# Patient Record
Sex: Female | Born: 1940 | Race: White | Hispanic: No | Smoking: Never smoker
Health system: Southern US, Community
[De-identification: ages and names within clinical notes are randomized; demographics above are authoritative.]

## PROBLEM LIST (undated history)

## (undated) DIAGNOSIS — R634 Abnormal weight loss: Secondary | ICD-10-CM

## (undated) DIAGNOSIS — I2510 Atherosclerotic heart disease of native coronary artery without angina pectoris: Secondary | ICD-10-CM

## (undated) DIAGNOSIS — Z8489 Family history of other specified conditions: Secondary | ICD-10-CM

## (undated) DIAGNOSIS — I4891 Unspecified atrial fibrillation: Secondary | ICD-10-CM

## (undated) DIAGNOSIS — I7121 Aneurysm of the ascending aorta, without rupture: Secondary | ICD-10-CM

## (undated) DIAGNOSIS — Z952 Presence of prosthetic heart valve: Secondary | ICD-10-CM

## (undated) DIAGNOSIS — F419 Anxiety disorder, unspecified: Secondary | ICD-10-CM

## (undated) DIAGNOSIS — R011 Cardiac murmur, unspecified: Secondary | ICD-10-CM

## (undated) DIAGNOSIS — M353 Polymyalgia rheumatica: Secondary | ICD-10-CM

## (undated) DIAGNOSIS — Z8719 Personal history of other diseases of the digestive system: Secondary | ICD-10-CM

## (undated) DIAGNOSIS — R9431 Abnormal electrocardiogram [ECG] [EKG]: Secondary | ICD-10-CM

## (undated) DIAGNOSIS — E785 Hyperlipidemia, unspecified: Secondary | ICD-10-CM

## (undated) DIAGNOSIS — I10 Essential (primary) hypertension: Secondary | ICD-10-CM

## (undated) DIAGNOSIS — I712 Thoracic aortic aneurysm, without rupture: Secondary | ICD-10-CM

## (undated) HISTORY — DX: Atherosclerotic heart disease of native coronary artery without angina pectoris: I25.10

## (undated) HISTORY — DX: Abnormal electrocardiogram (ECG) (EKG): R94.31

## (undated) HISTORY — DX: Anxiety disorder, unspecified: F41.9

## (undated) HISTORY — DX: Aneurysm of the ascending aorta, without rupture: I71.21

## (undated) HISTORY — DX: Thoracic aortic aneurysm, without rupture: I71.2

## (undated) HISTORY — PX: TONSILLECTOMY: SUR1361

## (undated) HISTORY — DX: Essential (primary) hypertension: I10

## (undated) HISTORY — DX: Abnormal weight loss: R63.4

## (undated) HISTORY — DX: Polymyalgia rheumatica: M35.3

## (undated) HISTORY — DX: Presence of prosthetic heart valve: Z95.2

## (undated) HISTORY — PX: TUBAL LIGATION: SHX77

## (undated) HISTORY — DX: Hyperlipidemia, unspecified: E78.5

---

## 2005-08-18 ENCOUNTER — Other Ambulatory Visit: Admission: RE | Admit: 2005-08-18 | Discharge: 2005-08-18 | Payer: Self-pay | Admitting: Obstetrics and Gynecology

## 2014-02-04 ENCOUNTER — Ambulatory Visit: Payer: Self-pay | Admitting: Cardiology

## 2014-03-14 DIAGNOSIS — I2510 Atherosclerotic heart disease of native coronary artery without angina pectoris: Secondary | ICD-10-CM | POA: Insufficient documentation

## 2014-03-14 HISTORY — DX: Atherosclerotic heart disease of native coronary artery without angina pectoris: I25.10

## 2014-03-18 ENCOUNTER — Ambulatory Visit (INDEPENDENT_AMBULATORY_CARE_PROVIDER_SITE_OTHER): Payer: 59 | Admitting: Cardiology

## 2014-03-18 ENCOUNTER — Encounter: Payer: Self-pay | Admitting: Cardiology

## 2014-03-18 ENCOUNTER — Telehealth: Payer: Self-pay

## 2014-03-18 VITALS — BP 144/50 | HR 67 | Ht 61.0 in | Wt 106.0 lb

## 2014-03-18 DIAGNOSIS — R079 Chest pain, unspecified: Secondary | ICD-10-CM | POA: Insufficient documentation

## 2014-03-18 DIAGNOSIS — I119 Hypertensive heart disease without heart failure: Secondary | ICD-10-CM

## 2014-03-18 DIAGNOSIS — R011 Cardiac murmur, unspecified: Secondary | ICD-10-CM | POA: Insufficient documentation

## 2014-03-18 DIAGNOSIS — I10 Essential (primary) hypertension: Secondary | ICD-10-CM | POA: Insufficient documentation

## 2014-03-18 NOTE — Progress Notes (Signed)
[facility], [place], [place]  [address] Phone: [phone] Fax:  [phone]  Date:  03/18/2014   ID:  Yvonne Ballard, DOB 08-18-1940, MRN [id number]  PCP:  Lynnell Jude, MD  Cardiologist:  Fransico Him, MD    History of Present Illness: Yvonne Ballard is a 74 y.o. female with a history of poorly controlled HTN.  Per PCP notes, she has high systolic and low diastolic readings.  Her HTN is apparently exacerbated by stress and anxiety.  She denies any  SOB, DOE, LE edema, dizziness, or syncope.  She recently described a sensation in her chest that is not a pain but a "twitching" but also tight and squeezing that is nonexertional and usually occurs more when she is anxious.  The tightness usually lasts up to 30 minutes but is not associated with diaphoresis or nausea.  There is no radiation of the discomfort.  She has not had any DOE.  EKG at PCP office showed NSR with PVC's and PAC's with marked LVH.  She is now here for further evaluation.  She says at home it usually runs 160/50-60's. Occasionally she will have a skipped heart beat.   Wt Readings from Last 3 Encounters:  03/18/14 106 lb (48.081 kg)     Past Medical History  Diagnosis Date  . Hypertension   . Abnormal ECG   . Anxiety disorder   . Weight loss   . Chest pain     Current Outpatient Prescriptions  Medication Sig Dispense Refill  . ALPRAZolam (XANAX) 0.25 MG tablet Take 0.25 mg by mouth daily as needed.  0  . CARTIA XT 120 MG 24 hr capsule Take 120 mg by mouth daily.  12  . diltiazem (CARDIZEM SR) 120 MG 12 hr capsule Take 120 mg by mouth daily. TAKE ONE TABLET BY MOUTH DAILY    . hydrochlorothiazide (HYDRODIURIL) 50 MG tablet Take 50 mg by mouth daily.  3   No current facility-administered medications for this visit.    Allergies:   Not on File  Social History:  The patient  reports that she has never smoked. She does not have any smokeless tobacco history on file. She reports that she drinks  alcohol.   Family History:  The patient's family history includes Cancer in her brother, father, and mother.   ROS:  Please see the history of present illness.      All other systems reviewed and negative.   PHYSICAL EXAM: VS:  BP 144/50 mmHg  Pulse 67  Ht 5\' 1"  (1.549 m)  Wt 106 lb (48.081 kg)  BMI 20.04 kg/m2 Well nourished, well developed, in no acute distress HEENT: normal Neck: no JVD Cardiac:  normal S1, S2; RRR; 2/6 SM at RUSB to apex and 2/6 diastolic murmur from RUSB to LLSB Lungs:  clear to auscultation bilaterally, no wheezing, rhonchi or rales Abd: soft, nontender, no hepatomegaly Ext: no edema Skin: warm and dry Neuro:  CNs 2-12 intact, no focal abnormalities noted  EKG:  NSR with PAC's and PVC's and marked LVH by voltage     ASSESSMENT AND PLAN:  1. HTN poorly controlled in the past but appears good today.  Will continue Cartia and I am enrolling her in the BP study group through our office.  I have asked her to check her BP daily for a week and call with results. 2. 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  I will get an echo  to assess.   3. Chest discomfort that is somewhat atypical and seem to occur worse with anxiety.  I will get a nuclear stress test to rule out ischemia.  4.  Heart murmur that sounds like she may have a degree of AS and AI.  I will check a 2D echo to assess heart murmur  Followup with me in 6 weeks  Signed, Fransico Him, MD [facility] 03/18/2014 4:37 PM

## 2014-03-18 NOTE — Patient Instructions (Addendum)
Your physician has requested that you have an echocardiogram A FEW DAYS BEFORE YOUR NUCLEAR STRESS TEST. Echocardiography is a painless test that uses sound waves to create images of your heart. It provides your doctor with information about the size and shape of your heart and how well your heart's chambers and valves are working. This procedure takes approximately one hour. There are no restrictions for this procedure.  Your physician has requested that you have a lexiscan myoview. For further information please visit [email or web address]. Please follow instruction sheet, as given.  Please check your blood pressure daily for one week and call us with results. [phone].  Your physician recommends that you schedule a follow-up appointment in: 6 weeks with Dr. Radford Pax.

## 2014-03-18 NOTE — Telephone Encounter (Signed)
Patient refuses Blood Pressure Program trial and nuclear stress test.  To Dr. Radford Pax.

## 2014-03-21 ENCOUNTER — Ambulatory Visit (HOSPITAL_COMMUNITY): Payer: 59 | Attending: Cardiovascular Disease | Admitting: Cardiology

## 2014-03-21 ENCOUNTER — Other Ambulatory Visit (INDEPENDENT_AMBULATORY_CARE_PROVIDER_SITE_OTHER): Payer: 59 | Admitting: *Deleted

## 2014-03-21 ENCOUNTER — Other Ambulatory Visit: Payer: Self-pay | Admitting: *Deleted

## 2014-03-21 ENCOUNTER — Telehealth: Payer: Self-pay | Admitting: Cardiovascular Disease

## 2014-03-21 ENCOUNTER — Encounter: Payer: Self-pay | Admitting: *Deleted

## 2014-03-21 DIAGNOSIS — R011 Cardiac murmur, unspecified: Secondary | ICD-10-CM | POA: Diagnosis not present

## 2014-03-21 DIAGNOSIS — I719 Aortic aneurysm of unspecified site, without rupture: Secondary | ICD-10-CM

## 2014-03-21 DIAGNOSIS — I10 Essential (primary) hypertension: Secondary | ICD-10-CM

## 2014-03-21 LAB — BASIC METABOLIC PANEL
BUN: 15 mg/dL (ref 6–23)
CO2: 30 meq/L (ref 19–32)
CREATININE: 0.6 mg/dL (ref 0.4–1.2)
Calcium: 8.6 mg/dL (ref 8.4–10.5)
Chloride: 100 mEq/L (ref 96–112)
GFR: 107.65 mL/min (ref >60.00)
Glucose, Bld: 92 mg/dL (ref 70–99)
POTASSIUM: 3.3 meq/L — AB (ref 3.5–5.1)
SODIUM: 137 meq/L (ref 135–145)

## 2014-03-21 NOTE — Progress Notes (Signed)
Echo performed. 

## 2014-03-21 NOTE — Telephone Encounter (Signed)
I called the patient to review her echocardiogram further. I initially evaluated her while the study was being done. I was contacted by the sonographer because of marked dilatation of the ascending aorta and associated aortic insufficiency. The patient was referred for the echo because of a diastolic heart murmur consistent with aortic insufficiency. She's had a wide pulse pressure for a long time. I suspected chronic aortic insufficiency related to annular dilatation and an ascending aortic aneurysm. A CTA of the chest was arranged for Monday.  I received a call from Dr. Johnsie Cancel who interpreted her echocardiogram and was concerned about possible acute aortic dissection. He recommended immediate hospitalization and inpatient evaluation. I called the patient to discuss plans further.I reviewed the concerns about acute dissection. The patient has no symptoms and she specifically denies chest pain or pressure. She adamantly refuses to come to the hospital. She understands that an acute dissection could be a life-threatening emergency. She will come in Monday as planned.   Based on her clinical presentation, my clinical suspicion is that this is more of a chronic problem.  Sherren Mocha 03/21/2014 5:48 PM

## 2014-03-24 ENCOUNTER — Ambulatory Visit (INDEPENDENT_AMBULATORY_CARE_PROVIDER_SITE_OTHER)
Admission: RE | Admit: 2014-03-24 | Discharge: 2014-03-24 | Disposition: A | Discharge status: Home / Self Care | Payer: 59 | Source: Ambulatory Visit | Attending: Cardiovascular Disease | Admitting: Cardiovascular Disease

## 2014-03-24 DIAGNOSIS — I719 Aortic aneurysm of unspecified site, without rupture: Secondary | ICD-10-CM

## 2014-03-24 MED ORDER — IOHEXOL 300 MG/ML  SOLN
80.0000 mL | Freq: Once | INTRAMUSCULAR | Status: AC | PRN
  Administered 2014-03-24: 80 mL via INTRAVENOUS

## 2014-03-24 MED ORDER — POTASSIUM CHLORIDE ER 20 MEQ PO TBCR: 20.0000 meq | EXTENDED_RELEASE_TABLET | Freq: Every day | ORAL | Status: DC

## 2014-03-24 NOTE — Telephone Encounter (Signed)
Per Dr. Radford Pax, instructed patient to START Kdur 20 meg -- to take 2 tablets today and 1 tablet daily starting tomorrow. Repeat lab ordered but patient is to call to schedule. She is aware it needs to be in about 1 week.

## 2014-03-24 NOTE — Telephone Encounter (Signed)
Patient in for CTA and asked for results of recent lab work.  Informed patient she would be called after Dr. Radford Pax releases it.

## 2014-03-24 NOTE — Addendum Note (Signed)
Addended by: Harland German A on: 03/24/2014 05:45 PM   Modules accepted: Orders

## 2014-03-24 NOTE — Telephone Encounter (Signed)
No problem thanks Ronalee Belts.  Is she set up for cath later on this week?

## 2014-03-25 ENCOUNTER — Institutional Professional Consult (permissible substitution) (INDEPENDENT_AMBULATORY_CARE_PROVIDER_SITE_OTHER): Payer: 59 | Admitting: Cardiothoracic Surgery

## 2014-03-25 ENCOUNTER — Telehealth: Payer: Self-pay

## 2014-03-25 ENCOUNTER — Other Ambulatory Visit (INDEPENDENT_AMBULATORY_CARE_PROVIDER_SITE_OTHER): Payer: 59 | Admitting: *Deleted

## 2014-03-25 ENCOUNTER — Other Ambulatory Visit: Payer: Self-pay | Admitting: *Deleted

## 2014-03-25 ENCOUNTER — Encounter: Payer: Self-pay | Admitting: Cardiothoracic Surgery

## 2014-03-25 VITALS — BP 189/60 | HR 77 | Resp 18 | Ht 61.0 in | Wt 105.0 lb

## 2014-03-25 DIAGNOSIS — Z01812 Encounter for preprocedural laboratory examination: Secondary | ICD-10-CM

## 2014-03-25 DIAGNOSIS — I719 Aortic aneurysm of unspecified site, without rupture: Secondary | ICD-10-CM

## 2014-03-25 DIAGNOSIS — I712 Thoracic aortic aneurysm, without rupture: Secondary | ICD-10-CM

## 2014-03-25 DIAGNOSIS — I7122 Aneurysm of the aortic arch, without rupture: Secondary | ICD-10-CM

## 2014-03-25 DIAGNOSIS — R079 Chest pain, unspecified: Secondary | ICD-10-CM

## 2014-03-25 DIAGNOSIS — I7121 Aneurysm of the ascending aorta, without rupture: Secondary | ICD-10-CM

## 2014-03-25 DIAGNOSIS — I351 Nonrheumatic aortic (valve) insufficiency: Secondary | ICD-10-CM

## 2014-03-25 LAB — CBC WITH DIFFERENTIAL/PLATELET
BASOS ABS: 0 10*3/uL (ref 0.0–0.1)
BASOS PCT: 0.6 % (ref 0.0–3.0)
Eosinophils Absolute: 0 10*3/uL (ref 0.0–0.7)
Eosinophils Relative: 0.5 % (ref 0.0–5.0)
HCT: 36.2 % (ref 36.0–46.0)
Hemoglobin: 11.9 g/dL — ABNORMAL LOW (ref 12.0–15.0)
LYMPHS PCT: 27.2 % (ref 12.0–46.0)
Lymphs Abs: 1.4 10*3/uL (ref 0.7–4.0)
MCHC: 33 g/dL (ref 30.0–36.0)
MCV: 91.3 fl (ref 78.0–100.0)
MONOS PCT: 7.6 % (ref 3.0–12.0)
Monocytes Absolute: 0.4 10*3/uL (ref 0.1–1.0)
NEUTROS PCT: 64.1 % (ref 43.0–77.0)
Neutro Abs: 3.3 10*3/uL (ref 1.4–7.7)
Platelets: 180 10*3/uL (ref 150.0–400.0)
RBC: 3.96 Mil/uL (ref 3.87–5.11)
RDW: 13.7 % (ref 11.5–15.5)
WBC: 5.1 10*3/uL (ref 4.0–10.5)

## 2014-03-25 LAB — PROTIME-INR
INR: 1 ratio (ref 0.8–1.0)
Prothrombin Time: 10.9 s (ref 9.6–13.1)

## 2014-03-25 LAB — BASIC METABOLIC PANEL
BUN: 15 mg/dL (ref 6–23)
CHLORIDE: 101 meq/L (ref 96–112)
CO2: 27 meq/L (ref 19–32)
Calcium: 8.8 mg/dL (ref 8.4–10.5)
Creatinine, Ser: 0.6 mg/dL (ref 0.4–1.2)
GFR: 103.52 mL/min (ref >60.00)
Glucose, Bld: 94 mg/dL (ref 70–99)
Potassium: 3.6 mEq/L (ref 3.5–5.1)
SODIUM: 135 meq/L (ref 135–145)

## 2014-03-25 NOTE — Progress Notes (Signed)
PCP is BLISS, Lynnell Jude, MD Referring Provider is Sueanne Margarita, MD        [address]       [place] [address]             [phone]    Patient examined, echocardiogram and CT angiogram reviewed. 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 The patient has no history of angina or presyncope. She is a never smoker. There is no family history of thoracic or abdominal aneurysm disease. There's no family history of aortic dissection or sudden death. The patient denies history of rheumatic heart disease as a school girl. The patient denies ankle edema, abdominal swelling, or weight gain. The patient is scheduled to undergo cardiac catheterization tomorrow in preparation for surgical repair-aortic root replacement and replacement of the ascending aorta to the mid arch.  The patient has no significant surgical history. She had 2 uncomplicated pregnancies and delivery without heart disease symptoms. She denies any active dental complaints and has her teeth in Cleaned every 6 months. she denies any bleeding complications.    Chief Complaint  Patient presents with  . TAA    eval and treat   Past Medical History  Diagnosis Date  . Hypertension   . Abnormal ECG   . Anxiety disorder   . Weight loss   . Chest pain     No past surgical history on file.  Family History  Problem Relation Age of Onset  . Cancer Mother   . Cancer Father   . Cancer Brother     Social History History  Substance Use Topics  . Smoking status: Never Smoker   . Smokeless tobacco: Not on file  . Alcohol Use: Yes    Current Outpatient Prescriptions  Medication Sig Dispense Refill  . ALPRAZolam (XANAX) 0.25 MG tablet Take 0.25 mg by mouth daily as needed.  0   . CARTIA XT 120 MG 24 hr capsule Take 120 mg by mouth daily.  12  . hydrochlorothiazide (HYDRODIURIL) 50 MG tablet Take 50 mg by mouth daily.  3  . potassium chloride 20 MEQ TBCR Take 20 mEq by mouth daily. 30 tablet 3   No current facility-administered medications for this visit.    Not on File  Review of Systems   The patient is right-hand dominant The patient denies history of thoracic trauma, or fracture or pneumothorax The patient denies history of DVT varicose veins Patient is currently widowed and lives with a 7 year old daughter The patient enjoys working in her yard and gardening but avoids strenuous activities because of her progressive  SOB and decreasing exercise tolerance  BP 189/60 mmHg  Pulse 77  Resp 18  Ht 5\' 1"  (1.549 m)  Wt 105 lb (47.628 kg)  BMI 19.85 kg/m2  SpO2 98%   Physical Exam Gen. Appearance-elderly Caucasian female presents by herself in no acute distress  HEENT-Normocephalic, pupils equal, dentition in good repair Neck-moderate JVD, prominent carotid pulses, no palpable adenopathy, no bruit Thorax-no deformity tenderness, breath sounds clear and equal Cardiac-regular rhythm, grade 2/6 systolic ejection murmur, grade 3/6 diastolic murmur left sternal border, no S3 gallop  Abdomen-scaphoid, and prominent   aortic pulsation, no organomegaly Extremities, no clubbing cyanosis edema or tenderness Vascular-waterhammer pulses in all extremities Neurologic-alert and oriented no  focal motor deficit, normal gait  Diagnostic Tests: Severe aortic insufficiency with LV dilatation on echocardiogram, no significant MR  Ascending aorta measurement 6.5 cm above the sinotubular junction, 5.0 cm at the proximal arch, innominate artery diameter 12.5 mm, descending thoracic aorta  normal size 3.0 cm  Impression: 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

## 2014-03-25 NOTE — Telephone Encounter (Signed)
Patient informed of CT results and verbal understanding expressed.  Patient to come in office today for pre-procedure lab work and to pick up instruction letter for catheterization scheduled for 03/26/14 at 1130 with Dr. Martinique.  ASAP referral ordered for CVTS.  Patient agrees with treatment plan.

## 2014-03-25 NOTE — Telephone Encounter (Signed)
-----   Message from Sueanne Margarita, MD sent at 03/24/2014  9:39 PM EST ----- Reviewed the findings of the chest CT which show a very large thoracic aortic aneurysm of the ascending aorta - please set up patient for cath Tuesday or Wednesday and get into see CVTS surgery ASAP 1/12

## 2014-03-26 ENCOUNTER — Encounter (HOSPITAL_COMMUNITY): Payer: Self-pay | Admitting: *Deleted

## 2014-03-26 ENCOUNTER — Encounter (HOSPITAL_COMMUNITY)
Admission: RE | Disposition: A | Discharge status: Home / Self Care | Payer: Self-pay | Source: Ambulatory Visit | Attending: Cardiology

## 2014-03-26 ENCOUNTER — Ambulatory Visit (HOSPITAL_COMMUNITY)
Admission: RE | Admit: 2014-03-26 | Discharge: 2014-03-26 | Disposition: A | Discharge status: Home / Self Care | Payer: 59 | Source: Ambulatory Visit | Attending: Cardiology | Admitting: Cardiology

## 2014-03-26 DIAGNOSIS — Z79899 Other long term (current) drug therapy: Secondary | ICD-10-CM | POA: Insufficient documentation

## 2014-03-26 DIAGNOSIS — R0601 Orthopnea: Secondary | ICD-10-CM | POA: Insufficient documentation

## 2014-03-26 DIAGNOSIS — I10 Essential (primary) hypertension: Secondary | ICD-10-CM | POA: Insufficient documentation

## 2014-03-26 DIAGNOSIS — I712 Thoracic aortic aneurysm, without rupture: Secondary | ICD-10-CM | POA: Diagnosis present

## 2014-03-26 DIAGNOSIS — I351 Nonrheumatic aortic (valve) insufficiency: Secondary | ICD-10-CM | POA: Diagnosis present

## 2014-03-26 DIAGNOSIS — I2510 Atherosclerotic heart disease of native coronary artery without angina pectoris: Secondary | ICD-10-CM | POA: Diagnosis not present

## 2014-03-26 DIAGNOSIS — I519 Heart disease, unspecified: Secondary | ICD-10-CM | POA: Insufficient documentation

## 2014-03-26 DIAGNOSIS — R011 Cardiac murmur, unspecified: Secondary | ICD-10-CM | POA: Diagnosis present

## 2014-03-26 HISTORY — PX: LEFT AND RIGHT HEART CATHETERIZATION WITH CORONARY ANGIOGRAM: SHX5449

## 2014-03-26 LAB — POCT I-STAT 3, ART BLOOD GAS (G3+)
ACID-BASE DEFICIT: 1 mmol/L (ref 0.0–2.0)
Bicarbonate: 23.7 mEq/L (ref 20.0–24.0)
O2 Saturation: 95 %
PCO2 ART: 36.7 mmHg (ref 35.0–45.0)
PO2 ART: 73 mmHg — AB (ref 80.0–100.0)
TCO2: 25 mmol/L (ref 0–100)
pH, Arterial: 7.417 (ref 7.350–7.450)

## 2014-03-26 LAB — POCT I-STAT 3, VENOUS BLOOD GAS (G3P V)
BICARBONATE: 24.7 meq/L — AB (ref 20.0–24.0)
O2 SAT: 70 %
PCO2 VEN: 37.6 mmHg — AB (ref 45.0–50.0)
PH VEN: 7.426 — AB (ref 7.250–7.300)
PO2 VEN: 35 mmHg (ref 30.0–45.0)
TCO2: 26 mmol/L (ref 0–100)

## 2014-03-26 SURGERY — LEFT AND RIGHT HEART CATHETERIZATION WITH CORONARY ANGIOGRAM
Anesthesia: LOCAL

## 2014-03-26 MED ORDER — NITROGLYCERIN 1 MG/10 ML FOR IR/CATH LAB
INTRA_ARTERIAL | Status: AC
  Filled 2014-03-26: qty 10

## 2014-03-26 MED ORDER — FENTANYL CITRATE 0.05 MG/ML IJ SOLN
INTRAMUSCULAR | Status: AC
  Filled 2014-03-26: qty 2

## 2014-03-26 MED ORDER — SODIUM CHLORIDE 0.9 % IV SOLN
INTRAVENOUS | Status: DC
  Administered 2014-03-26: 10:00:00 via INTRAVENOUS

## 2014-03-26 MED ORDER — HEPARIN (PORCINE) IN NACL 2-0.9 UNIT/ML-% IJ SOLN
INTRAMUSCULAR | Status: AC
  Filled 2014-03-26: qty 1000

## 2014-03-26 MED ORDER — LIDOCAINE HCL (PF) 1 % IJ SOLN
INTRAMUSCULAR | Status: AC
  Filled 2014-03-26: qty 30

## 2014-03-26 MED ORDER — SODIUM CHLORIDE 0.9 % IV SOLN: 1.0000 mL/kg/h | INTRAVENOUS | Status: DC

## 2014-03-26 MED ORDER — SODIUM CHLORIDE 0.9 % IJ SOLN: 3.0000 mL | INTRAMUSCULAR | Status: DC | PRN

## 2014-03-26 MED ORDER — SODIUM CHLORIDE 0.9 % IV SOLN: 250.0000 mL | INTRAVENOUS | Status: DC | PRN

## 2014-03-26 MED ORDER — ASPIRIN 81 MG PO CHEW: 81.0000 mg | CHEWABLE_TABLET | ORAL | Status: DC

## 2014-03-26 MED ORDER — MIDAZOLAM HCL 2 MG/2ML IJ SOLN
INTRAMUSCULAR | Status: AC
  Filled 2014-03-26: qty 2

## 2014-03-26 MED ORDER — HEPARIN SODIUM (PORCINE) 1000 UNIT/ML IJ SOLN
INTRAMUSCULAR | Status: AC
  Filled 2014-03-26: qty 1

## 2014-03-26 MED ORDER — VERAPAMIL HCL 2.5 MG/ML IV SOLN
INTRAVENOUS | Status: AC
  Filled 2014-03-26: qty 2

## 2014-03-26 MED ORDER — SODIUM CHLORIDE 0.9 % IJ SOLN: 3.0000 mL | Freq: Two times a day (BID) | INTRAMUSCULAR | Status: DC

## 2014-03-26 NOTE — Interval H&P Note (Signed)
History and Physical Interval Note:  03/26/2014 11:36 AM  Yvonne Ballard  has presented today for surgery, with the diagnosis of aaa  The various methods of treatment have been discussed with the patient and family. After consideration of risks, benefits and other options for treatment, the patient has consented to  Procedure(s): LEFT AND RIGHT HEART CATHETERIZATION WITH CORONARY ANGIOGRAM (N/A) as a surgical intervention .  The patient's history has been reviewed, patient examined, no change in status, stable for surgery.  I have reviewed the patient's chart and labs.  Questions were answered to the patient's satisfaction.   Cath Lab Visit (complete for each Cath Lab visit)  Clinical Evaluation Leading to the Procedure:   ACS: No.  Non-ACS:    Anginal Classification: CCS II  Anti-ischemic medical therapy: Minimal Therapy (1 class of medications)  Non-Invasive Test Results: No non-invasive testing performed  Prior CABG: No previous CABG        Collier Salina [facility] 03/26/2014 11:36 AM

## 2014-03-26 NOTE — Discharge Instructions (Signed)

## 2014-03-26 NOTE — CV Procedure (Signed)
    Cardiac Catheterization Procedure Note  Name: Brita Jurgensen MRN: [id number] DOB: 04/06/1940  Procedure: Right Heart Cath, Left Heart Cath, Selective Coronary Angiography, LV angiography  Indication: 74 yo WF with a 6.5 cm thoracic aneurysm and severe aortic insufficiency.   Procedural Details: The right groin was prepped, draped, and anesthetized with 1% lidocaine. Using the modified Seldinger technique a 5 Fr  sheath was placed in the right femoral artery and a 7 French sheath was placed in the right femoral vein. A Swan-Ganz catheter was used for the right heart catheterization. Standard protocol was followed for recording of right heart pressures and sampling of oxygen saturations. Fick cardiac output was calculated. Standard pigtail catheter was used for left ventriculography. Due to marked distortion of the aorta from the aneurysm the coronary arteries were very difficult to engage. The left coronary artery was engaged with a JL6 catheter and the tip was straightened with a heat gun. The RCA was engaged with a LA3 catheter. Femoral hemostasis was obtained with manual compression.There were no immediate procedural complications. The patient was transferred to the post catheterization recovery area for further monitoring.  Procedural Findings: Hemodynamics RA 7/2 mean 1 mm Hg RV 44/1 mm Hg PA 42/15 mean 27 mm Hg PCWP 18/26 mean 16 mm Hg LV 196/27 mm Hg AO 199/51 mean 105 mm Hg  Oxygen saturations: PA 78% AO 95%  Cardiac Output (Fick) 7 L/min  Cardiac Index (Fick) 4.8 L/min/meter squared   Coronary angiography: Coronary dominance: Codominant  Left mainstem: Short normal.  Left anterior descending (LAD): There is mild calcification in the proximal LAD. There is 30% narrowing in the proximal vessel.   Left circumflex (LCx): Codominant. Normal.  Right coronary artery (RCA): 20-30% proximal stenosis.   Left ventriculography: Left ventricular systolic function is abnormal,  LVEF is estimated at 45% with global hypokinesis, there is no significant mitral regurgitation   Final Conclusions:   1. Mild nonobstructive CAD 2. Mild LV dysfunction. 3. Thoracic aortic aneurysm. 4. Upper normal pulmonary pressures.   Recommendations: proceed with AVR and Bentall procedure as scheduled.    Peter Martinique, [place] 03/26/2014, 1:14 PM

## 2014-03-26 NOTE — Progress Notes (Signed)
Report given, assume care.

## 2014-03-26 NOTE — H&P (View-Only) (Signed)
PCP is BLISS, Lynnell Jude, MD Referring Provider is Sueanne Margarita, MD        [address]       [place] [address]             [phone]    Patient examined, echocardiogram and CT angiogram reviewed. 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 The patient has no history of angina or presyncope. She is a never smoker. There is no family history of thoracic or abdominal aneurysm disease. There's no family history of aortic dissection or sudden death. The patient denies history of rheumatic heart disease as a school girl. The patient denies ankle edema, abdominal swelling, or weight gain. The patient is scheduled to undergo cardiac catheterization tomorrow in preparation for surgical repair-aortic root replacement and replacement of the ascending aorta to the mid arch.  The patient has no significant surgical history. She had 2 uncomplicated pregnancies and delivery without heart disease symptoms. She denies any active dental complaints and has her teeth in Cleaned every 6 months. she denies any bleeding complications.    Chief Complaint  Patient presents with  . TAA    eval and treat   Past Medical History  Diagnosis Date  . Hypertension   . Abnormal ECG   . Anxiety disorder   . Weight loss   . Chest pain     No past surgical history on file.  Family History  Problem Relation Age of Onset  . Cancer Mother   . Cancer Father   . Cancer Brother     Social History History  Substance Use Topics  . Smoking status: Never Smoker   . Smokeless tobacco: Not on file  . Alcohol Use: Yes    Current Outpatient Prescriptions  Medication Sig Dispense Refill  . ALPRAZolam (XANAX) 0.25 MG tablet Take 0.25 mg by mouth daily as needed.  0   . CARTIA XT 120 MG 24 hr capsule Take 120 mg by mouth daily.  12  . hydrochlorothiazide (HYDRODIURIL) 50 MG tablet Take 50 mg by mouth daily.  3  . potassium chloride 20 MEQ TBCR Take 20 mEq by mouth daily. 30 tablet 3   No current facility-administered medications for this visit.    Not on File  Review of Systems   The patient is right-hand dominant The patient denies history of thoracic trauma, or fracture or pneumothorax The patient denies history of DVT varicose veins Patient is currently widowed and lives with a 61 year old daughter The patient enjoys working in her yard and gardening but avoids strenuous activities because of her progressive  SOB and decreasing exercise tolerance  BP 189/60 mmHg  Pulse 77  Resp 18  Ht 5\' 1"  (1.549 m)  Wt 105 lb (47.628 kg)  BMI 19.85 kg/m2  SpO2 98%   Physical Exam Gen. Appearance-elderly Caucasian female presents by herself in no acute distress  HEENT-Normocephalic, pupils equal, dentition in good repair Neck-moderate JVD, prominent carotid pulses, no palpable adenopathy, no bruit Thorax-no deformity tenderness, breath sounds clear and equal Cardiac-regular rhythm, grade 2/6 systolic ejection murmur, grade 3/6 diastolic murmur left sternal border, no S3 gallop  Abdomen-scaphoid, and prominent   aortic pulsation, no organomegaly Extremities, no clubbing cyanosis edema or tenderness Vascular-waterhammer pulses in all extremities Neurologic-alert and oriented no  focal motor deficit, normal gait  Diagnostic Tests: Severe aortic insufficiency with LV dilatation on echocardiogram, no significant MR  Ascending aorta measurement 6.5 cm above the sinotubular junction, 5.0 cm at the proximal arch, innominate artery diameter 12.5 mm, descending thoracic aorta  normal size 3.0 cm  Impression: 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

## 2014-03-27 DIAGNOSIS — I712 Thoracic aortic aneurysm, without rupture: Secondary | ICD-10-CM

## 2014-03-28 ENCOUNTER — Encounter (HOSPITAL_COMMUNITY)
Admission: RE | Admit: 2014-03-28 | Discharge: 2014-03-28 | Disposition: A | Discharge status: Home / Self Care | Payer: 59 | Source: Ambulatory Visit | Attending: Cardiothoracic Surgery | Admitting: Cardiothoracic Surgery

## 2014-03-28 ENCOUNTER — Ambulatory Visit (HOSPITAL_COMMUNITY)
Admission: RE | Admit: 2014-03-28 | Discharge: 2014-03-28 | Disposition: A | Discharge status: Home / Self Care | Payer: 59 | Source: Ambulatory Visit | Attending: Cardiothoracic Surgery | Admitting: Cardiothoracic Surgery

## 2014-03-28 ENCOUNTER — Encounter (HOSPITAL_COMMUNITY): Payer: Self-pay

## 2014-03-28 VITALS — BP 159/37 | HR 72 | Temp 98.0°F | Resp 18 | Ht 61.0 in | Wt 107.4 lb

## 2014-03-28 DIAGNOSIS — I7121 Aneurysm of the ascending aorta, without rupture: Secondary | ICD-10-CM

## 2014-03-28 DIAGNOSIS — I719 Aortic aneurysm of unspecified site, without rupture: Secondary | ICD-10-CM

## 2014-03-28 DIAGNOSIS — I714 Abdominal aortic aneurysm, without rupture: Secondary | ICD-10-CM | POA: Insufficient documentation

## 2014-03-28 DIAGNOSIS — I10 Essential (primary) hypertension: Secondary | ICD-10-CM | POA: Diagnosis present

## 2014-03-28 DIAGNOSIS — I351 Nonrheumatic aortic (valve) insufficiency: Secondary | ICD-10-CM

## 2014-03-28 HISTORY — DX: Anxiety disorder, unspecified: F41.9

## 2014-03-28 HISTORY — DX: Cardiac murmur, unspecified: R01.1

## 2014-03-28 LAB — PULMONARY FUNCTION TEST
DL/VA % pred: 89 %
DL/VA: 3.79 ml/min/mmHg/L
DLCO cor % pred: 75 %
DLCO cor: 14.32 ml/min/mmHg
DLCO unc % pred: 75 %
DLCO unc: 14.32 ml/min/mmHg
FEF 25-75 Post: 1.78 L/sec
FEF 25-75 Pre: 1.8 L/sec
FEF2575-%Change-Post: 0 %
FEF2575-%Pred-Post: 125 %
FEF2575-%Pred-Pre: 126 %
FEV1-%Change-Post: 2 %
FEV1-%Pred-Post: 108 %
FEV1-%Pred-Pre: 105 %
FEV1-Post: 1.87 L
FEV1-Pre: 1.83 L
FEV1FVC-%Change-Post: 1 %
FEV1FVC-%Pred-Pre: 111 %
FEV6-%Change-Post: 0 %
FEV6-%Pred-Post: 100 %
FEV6-%Pred-Pre: 99 %
FEV6-Post: 2.2 L
FEV6-Pre: 2.18 L
FEV6FVC-%Change-Post: 0 %
FEV6FVC-%Pred-Post: 106 %
FEV6FVC-%Pred-Pre: 106 %
FVC-%Change-Post: 0 %
FVC-%Pred-Post: 94 %
FVC-%Pred-Pre: 93 %
FVC-Post: 2.2 L
FVC-Pre: 2.19 L
Post FEV1/FVC ratio: 85 %
Post FEV6/FVC ratio: 100 %
Pre FEV1/FVC ratio: 84 %
Pre FEV6/FVC Ratio: 100 %
RV % pred: 90 %
RV: 1.88 L
TLC % pred: 96 %
TLC: 4.29 L

## 2014-03-28 LAB — URINE MICROSCOPIC-ADD ON

## 2014-03-28 LAB — COMPREHENSIVE METABOLIC PANEL
ALT: 17 U/L (ref 0–35)
AST: 27 U/L (ref 0–37)
Albumin: 3.5 g/dL (ref 3.5–5.2)
Alkaline Phosphatase: 66 U/L (ref 39–117)
Anion gap: 7 (ref 5–15)
BUN: 12 mg/dL (ref 6–23)
CO2: 26 mmol/L (ref 19–32)
Calcium: 8.7 mg/dL (ref 8.4–10.5)
Chloride: 104 mEq/L (ref 96–112)
Creatinine, Ser: 0.65 mg/dL (ref 0.50–1.10)
GFR calc Af Amer: >90 mL/min (ref >90)
GFR calc non Af Amer: 85 mL/min — ABNORMAL LOW (ref >90)
Glucose, Bld: 110 mg/dL — ABNORMAL HIGH (ref 70–99)
Potassium: 3.7 mmol/L (ref 3.5–5.1)
Sodium: 137 mmol/L (ref 135–145)
Total Bilirubin: 0.4 mg/dL (ref 0.3–1.2)
Total Protein: 6.1 g/dL (ref 6.0–8.3)

## 2014-03-28 LAB — URINALYSIS, ROUTINE W REFLEX MICROSCOPIC
Bilirubin Urine: NEGATIVE
Glucose, UA: NEGATIVE mg/dL
Ketones, ur: NEGATIVE mg/dL
Leukocytes, UA: NEGATIVE
Nitrite: NEGATIVE
Protein, ur: NEGATIVE mg/dL
Specific Gravity, Urine: 1.017 (ref 1.005–1.030)
Urobilinogen, UA: 0.2 mg/dL (ref 0.0–1.0)
pH: 5.5 (ref 5.0–8.0)

## 2014-03-28 LAB — BLOOD GAS, ARTERIAL
Acid-Base Excess: 0 mmol/L (ref 0.0–2.0)
Bicarbonate: 23.6 mEq/L (ref 20.0–24.0)
Drawn by: 428831
FIO2: 0.21 %
O2 Saturation: 94.6 %
Patient temperature: 98.6
TCO2: 24.6 mmol/L (ref 0–100)
pCO2 arterial: 34.7 mmHg — ABNORMAL LOW (ref 35.0–45.0)
pH, Arterial: 7.447 (ref 7.350–7.450)
pO2, Arterial: 74.5 mmHg — ABNORMAL LOW (ref 80.0–100.0)

## 2014-03-28 LAB — CBC
HCT: 35.1 % — ABNORMAL LOW (ref 36.0–46.0)
Hemoglobin: 11.5 g/dL — ABNORMAL LOW (ref 12.0–15.0)
MCH: 30.1 pg (ref 26.0–34.0)
MCHC: 32.8 g/dL (ref 30.0–36.0)
MCV: 91.9 fL (ref 78.0–100.0)
Platelets: 174 10*3/uL (ref 150–400)
RBC: 3.82 MIL/uL — ABNORMAL LOW (ref 3.87–5.11)
RDW: 13.3 % (ref 11.5–15.5)
WBC: 4.8 10*3/uL (ref 4.0–10.5)

## 2014-03-28 LAB — SURGICAL PCR SCREEN
MRSA, PCR: NEGATIVE
Staphylococcus aureus: NEGATIVE

## 2014-03-28 LAB — PROTIME-INR
INR: 1.02 (ref 0.00–1.49)
Prothrombin Time: 13.5 seconds (ref 11.6–15.2)

## 2014-03-28 LAB — APTT: aPTT: 31 seconds (ref 24–37)

## 2014-03-28 LAB — HEMOGLOBIN A1C
Hgb A1c MFr Bld: 5.6 % (ref <5.7)
Mean Plasma Glucose: 114 mg/dL (ref <117)

## 2014-03-28 MED ORDER — ALBUTEROL SULFATE (2.5 MG/3ML) 0.083% IN NEBU
2.5000 mg | INHALATION_SOLUTION | Freq: Once | RESPIRATORY_TRACT | Status: AC
  Administered 2014-03-28: 2.5 mg via RESPIRATORY_TRACT

## 2014-03-28 NOTE — Progress Notes (Signed)
VASCULAR LAB PRELIMINARY  PRELIMINARY  PRELIMINARY  PRELIMINARY  Pre-op Cardiac Surgery  Carotid Findings:  Bilateral:  1-39% ICA stenosis.  Vertebral artery flow is antegrade.  There is an elevation of pressures in the distal ICA with no evidence of plaque or kinking. Probably due to tortuosity.   Upper Extremity Right Left  Brachial Pressures 146 Triphasic 152 Triphasic  Radial Waveforms Triphasic Triphasic  Ulnar Waveforms Triphasic Triphasic  Palmar Arch (Allen's Test) Normal Normal   Findings:  Palmar arch evaluation - Doppler waveforms remained normal bilaterally with both radial and ulnar compressions     Yvonne Ballard, RVS 03/28/2014, 3:56 PM

## 2014-03-28 NOTE — Progress Notes (Signed)
Pt. Expressing feeling of uneasiness,  stating , "listening to all these instructions is making me feel sick to my stomach". Pt. Reassured & told that the staff is in hopes of gathering all the information to make her feel safe & well cared for. Pt. States, " if I don't come back because of all of this I wouldn't be surprised". Pt. Again reassured & told that there are a lot people here to help her & that our heart department is well equipped to improve her health.  Pt. Stated, "its called CYA"  . Pt. Asked me not to read the instructions to her.  Pt. Is clear on time of arrival for 03/31/2014. Pt. Aware of appt. For PFT & dopplers this afternoon. Pt. accepted the cardiac prep book & took it with her.

## 2014-03-29 LAB — ABO/RH: ABO/RH(D): AB POS

## 2014-03-30 MED ORDER — DEXTROSE 5 % IV SOLN
0.0000 ug/min | INTRAVENOUS | Status: DC
  Filled 2014-03-30: qty 4

## 2014-03-30 MED ORDER — SODIUM CHLORIDE 0.9 % IV SOLN
INTRAVENOUS | Status: AC
  Administered 2014-03-31: 69.8 mL/h via INTRAVENOUS
  Filled 2014-03-30: qty 40

## 2014-03-30 MED ORDER — SODIUM CHLORIDE 0.9 % IV SOLN
INTRAVENOUS | Status: DC
  Filled 2014-03-30: qty 30

## 2014-03-30 MED ORDER — CEFUROXIME SODIUM 1.5 G IJ SOLR
1.5000 g | INTRAMUSCULAR | Status: AC
  Administered 2014-03-31: 1500 mg via INTRAVENOUS
  Administered 2014-03-31: .75 mg via INTRAVENOUS
  Filled 2014-03-30 (×2): qty 1.5

## 2014-03-30 MED ORDER — NITROGLYCERIN IN D5W 200-5 MCG/ML-% IV SOLN
2.0000 ug/min | INTRAVENOUS | Status: AC
  Administered 2014-03-31: 5 ug/min via INTRAVENOUS
  Filled 2014-03-30: qty 250

## 2014-03-30 MED ORDER — METOPROLOL TARTRATE 12.5 MG HALF TABLET
12.5000 mg | ORAL_TABLET | Freq: Once | ORAL | Status: DC
Start: 2014-03-31 — End: 2014-03-31
  Filled 2014-03-30: qty 1

## 2014-03-30 MED ORDER — DEXTROSE 5 % IV SOLN
750.0000 mg | INTRAVENOUS | Status: DC
  Filled 2014-03-30: qty 750

## 2014-03-30 MED ORDER — VERAPAMIL HCL 2.5 MG/ML IV SOLN
INTRAVENOUS | Status: DC
  Filled 2014-03-30: qty 2.5

## 2014-03-30 MED ORDER — PAPAVERINE HCL 30 MG/ML IJ SOLN
INTRAMUSCULAR | Status: AC
  Administered 2014-03-31: 500 mL
  Filled 2014-03-30: qty 2.5

## 2014-03-30 MED ORDER — DEXMEDETOMIDINE HCL IN NACL 400 MCG/100ML IV SOLN
0.1000 ug/kg/h | INTRAVENOUS | Status: AC
  Administered 2014-03-31: .3 ug/kg/h via INTRAVENOUS
  Filled 2014-03-30: qty 100

## 2014-03-30 MED ORDER — PHENYLEPHRINE HCL 10 MG/ML IJ SOLN
30.0000 ug/min | INTRAMUSCULAR | Status: DC
  Filled 2014-03-30: qty 2

## 2014-03-30 MED ORDER — VANCOMYCIN HCL 10 G IV SOLR
1250.0000 mg | INTRAVENOUS | Status: AC
  Administered 2014-03-31: 1250 mg via INTRAVENOUS
  Filled 2014-03-30: qty 1250

## 2014-03-30 MED ORDER — CHLORHEXIDINE GLUCONATE 4 % EX LIQD
30.0000 mL | CUTANEOUS | Status: DC
  Filled 2014-03-30: qty 30

## 2014-03-30 MED ORDER — SODIUM CHLORIDE 0.9 % IV SOLN
INTRAVENOUS | Status: AC
  Administered 2014-03-31: 1 [IU]/h via INTRAVENOUS
  Filled 2014-03-30: qty 2.5

## 2014-03-30 MED ORDER — MAGNESIUM SULFATE 50 % IJ SOLN
40.0000 meq | INTRAMUSCULAR | Status: DC
  Filled 2014-03-30: qty 10

## 2014-03-30 MED ORDER — POTASSIUM CHLORIDE 2 MEQ/ML IV SOLN
80.0000 meq | INTRAVENOUS | Status: DC
  Filled 2014-03-30: qty 40

## 2014-03-30 MED ORDER — DOPAMINE-DEXTROSE 3.2-5 MG/ML-% IV SOLN
0.0000 ug/kg/min | INTRAVENOUS | Status: AC
  Administered 2014-03-31: 2 ug/kg/min via INTRAVENOUS
  Filled 2014-03-30: qty 250

## 2014-03-31 ENCOUNTER — Encounter (HOSPITAL_COMMUNITY)
Admission: RE | Disposition: A | Discharge status: Home Health | Payer: 59 | Source: Ambulatory Visit | Attending: Cardiothoracic Surgery

## 2014-03-31 ENCOUNTER — Inpatient Hospital Stay (HOSPITAL_COMMUNITY)
Admission: RE | Admit: 2014-03-31 | Discharge: 2014-04-08 | DRG: 220 | Disposition: A | Discharge status: Home Health | Payer: Medicare Other | Source: Ambulatory Visit | Attending: Cardiothoracic Surgery | Admitting: Cardiothoracic Surgery

## 2014-03-31 ENCOUNTER — Inpatient Hospital Stay (HOSPITAL_COMMUNITY): Payer: Medicare Other | Admitting: Certified Registered Nurse Anesthetist

## 2014-03-31 ENCOUNTER — Inpatient Hospital Stay (HOSPITAL_COMMUNITY): Payer: Medicare Other

## 2014-03-31 ENCOUNTER — Encounter (HOSPITAL_COMMUNITY): Payer: Self-pay | Admitting: Certified Registered Nurse Anesthetist

## 2014-03-31 DIAGNOSIS — I493 Ventricular premature depolarization: Secondary | ICD-10-CM | POA: Diagnosis not present

## 2014-03-31 DIAGNOSIS — I10 Essential (primary) hypertension: Secondary | ICD-10-CM | POA: Diagnosis present

## 2014-03-31 DIAGNOSIS — I719 Aortic aneurysm of unspecified site, without rupture: Secondary | ICD-10-CM

## 2014-03-31 DIAGNOSIS — Z952 Presence of prosthetic heart valve: Secondary | ICD-10-CM

## 2014-03-31 DIAGNOSIS — D696 Thrombocytopenia, unspecified: Secondary | ICD-10-CM | POA: Diagnosis not present

## 2014-03-31 DIAGNOSIS — I7121 Aneurysm of the ascending aorta, without rupture: Secondary | ICD-10-CM

## 2014-03-31 DIAGNOSIS — I351 Nonrheumatic aortic (valve) insufficiency: Secondary | ICD-10-CM | POA: Diagnosis present

## 2014-03-31 DIAGNOSIS — D62 Acute posthemorrhagic anemia: Secondary | ICD-10-CM | POA: Diagnosis not present

## 2014-03-31 DIAGNOSIS — A047 Enterocolitis due to Clostridium difficile: Secondary | ICD-10-CM | POA: Diagnosis not present

## 2014-03-31 DIAGNOSIS — Z419 Encounter for procedure for purposes other than remedying health state, unspecified: Secondary | ICD-10-CM

## 2014-03-31 DIAGNOSIS — I4891 Unspecified atrial fibrillation: Secondary | ICD-10-CM | POA: Diagnosis not present

## 2014-03-31 DIAGNOSIS — E8770 Fluid overload, unspecified: Secondary | ICD-10-CM | POA: Diagnosis not present

## 2014-03-31 DIAGNOSIS — I712 Thoracic aortic aneurysm, without rupture, unspecified: Secondary | ICD-10-CM | POA: Diagnosis present

## 2014-03-31 DIAGNOSIS — I7122 Aneurysm of the aortic arch, without rupture: Secondary | ICD-10-CM

## 2014-03-31 DIAGNOSIS — I714 Abdominal aortic aneurysm, without rupture: Secondary | ICD-10-CM

## 2014-03-31 HISTORY — PX: TEE WITHOUT CARDIOVERSION: SHX5443

## 2014-03-31 HISTORY — PX: BENTALL PROCEDURE: SHX5058

## 2014-03-31 LAB — POCT I-STAT 3, ART BLOOD GAS (G3+)
Acid-Base Excess: 3 mmol/L — ABNORMAL HIGH (ref 0.0–2.0)
Acid-base deficit: 12 mmol/L — ABNORMAL HIGH (ref 0.0–2.0)
Acid-base deficit: 1 mmol/L (ref 0.0–2.0)
Bicarbonate: 25.4 mEq/L — ABNORMAL HIGH (ref 20.0–24.0)
Bicarbonate: 16.6 mEq/L — ABNORMAL LOW (ref 20.0–24.0)
Bicarbonate: 23.9 mEq/L (ref 20.0–24.0)
Bicarbonate: 26 mEq/L — ABNORMAL HIGH (ref 20.0–24.0)
O2 Saturation: 100 %
O2 Saturation: 100 %
O2 Saturation: 99 %
O2 Saturation: 94 %
Patient temperature: 35.9
TCO2: 27 mmol/L (ref 0–100)
TCO2: 18 mmol/L (ref 0–100)
TCO2: 25 mmol/L (ref 0–100)
TCO2: 27 mmol/L (ref 0–100)
pCO2 arterial: 42.9 mmHg (ref 35.0–45.0)
pCO2 arterial: 50 mmHg — ABNORMAL HIGH (ref 35.0–45.0)
pCO2 arterial: 41.3 mmHg (ref 35.0–45.0)
pCO2 arterial: 33.2 mmHg — ABNORMAL LOW (ref 35.0–45.0)
pH, Arterial: 7.38 (ref 7.350–7.450)
pH, Arterial: 7.129 — CL (ref 7.350–7.450)
pH, Arterial: 7.37 (ref 7.350–7.450)
pH, Arterial: 7.498 — ABNORMAL HIGH (ref 7.350–7.450)
pO2, Arterial: 346 mmHg — ABNORMAL HIGH (ref 80.0–100.0)
pO2, Arterial: 278 mmHg — ABNORMAL HIGH (ref 80.0–100.0)
pO2, Arterial: 135 mmHg — ABNORMAL HIGH (ref 80.0–100.0)
pO2, Arterial: 60 mmHg — ABNORMAL LOW (ref 80.0–100.0)

## 2014-03-31 LAB — POCT I-STAT, CHEM 8
BUN: 10 mg/dL (ref 6–23)
BUN: 8 mg/dL (ref 6–23)
BUN: 8 mg/dL (ref 6–23)
BUN: 9 mg/dL (ref 6–23)
BUN: 9 mg/dL (ref 6–23)
Calcium, Ion: 1.18 mmol/L (ref 1.13–1.30)
Calcium, Ion: 0.99 mmol/L — ABNORMAL LOW (ref 1.13–1.30)
Calcium, Ion: 1.22 mmol/L (ref 1.13–1.30)
Calcium, Ion: 0.85 mmol/L — ABNORMAL LOW (ref 1.13–1.30)
Calcium, Ion: 1.1 mmol/L — ABNORMAL LOW (ref 1.13–1.30)
Chloride: 102 mEq/L (ref 96–112)
Chloride: 103 mEq/L (ref 96–112)
Chloride: 98 mEq/L (ref 96–112)
Chloride: 101 mEq/L (ref 96–112)
Chloride: 101 mEq/L (ref 96–112)
Creatinine, Ser: 0.4 mg/dL — ABNORMAL LOW (ref 0.50–1.10)
Creatinine, Ser: 0.4 mg/dL — ABNORMAL LOW (ref 0.50–1.10)
Creatinine, Ser: 0.4 mg/dL — ABNORMAL LOW (ref 0.50–1.10)
Creatinine, Ser: 0.4 mg/dL — ABNORMAL LOW (ref 0.50–1.10)
Creatinine, Ser: 0.4 mg/dL — ABNORMAL LOW (ref 0.50–1.10)
Glucose, Bld: 123 mg/dL — ABNORMAL HIGH (ref 70–99)
Glucose, Bld: 133 mg/dL — ABNORMAL HIGH (ref 70–99)
Glucose, Bld: 96 mg/dL (ref 70–99)
Glucose, Bld: 246 mg/dL — ABNORMAL HIGH (ref 70–99)
Glucose, Bld: 102 mg/dL — ABNORMAL HIGH (ref 70–99)
HCT: 28 % — ABNORMAL LOW (ref 36.0–46.0)
HCT: 25 % — ABNORMAL LOW (ref 36.0–46.0)
HCT: 25 % — ABNORMAL LOW (ref 36.0–46.0)
HCT: 21 % — ABNORMAL LOW (ref 36.0–46.0)
HCT: 24 % — ABNORMAL LOW (ref 36.0–46.0)
Hemoglobin: 9.5 g/dL — ABNORMAL LOW (ref 12.0–15.0)
Hemoglobin: 8.5 g/dL — ABNORMAL LOW (ref 12.0–15.0)
Hemoglobin: 8.5 g/dL — ABNORMAL LOW (ref 12.0–15.0)
Hemoglobin: 7.1 g/dL — ABNORMAL LOW (ref 12.0–15.0)
Hemoglobin: 8.2 g/dL — ABNORMAL LOW (ref 12.0–15.0)
Potassium: 3.3 mmol/L — ABNORMAL LOW (ref 3.5–5.1)
Potassium: 3.1 mmol/L — ABNORMAL LOW (ref 3.5–5.1)
Potassium: 3.2 mmol/L — ABNORMAL LOW (ref 3.5–5.1)
Potassium: 4.3 mmol/L (ref 3.5–5.1)
Potassium: 3.4 mmol/L — ABNORMAL LOW (ref 3.5–5.1)
Sodium: 138 mmol/L (ref 135–145)
Sodium: 136 mmol/L (ref 135–145)
Sodium: 141 mmol/L (ref 135–145)
Sodium: 135 mmol/L (ref 135–145)
Sodium: 141 mmol/L (ref 135–145)
TCO2: 25 mmol/L (ref 0–100)
TCO2: 22 mmol/L (ref 0–100)
TCO2: 25 mmol/L (ref 0–100)
TCO2: 16 mmol/L (ref 0–100)
TCO2: 21 mmol/L (ref 0–100)

## 2014-03-31 LAB — CBC
HCT: 34.3 % — ABNORMAL LOW (ref 36.0–46.0)
Hemoglobin: 11.9 g/dL — ABNORMAL LOW (ref 12.0–15.0)
MCH: 30.2 pg (ref 26.0–34.0)
MCHC: 34.7 g/dL (ref 30.0–36.0)
MCV: 87.1 fL (ref 78.0–100.0)
PLATELETS: 148 10*3/uL — AB (ref 150–400)
RBC: 3.94 MIL/uL (ref 3.87–5.11)
RDW: 13.8 % (ref 11.5–15.5)
WBC: 6.6 10*3/uL (ref 4.0–10.5)

## 2014-03-31 LAB — POCT I-STAT 4, (NA,K, GLUC, HGB,HCT)
Glucose, Bld: 53 mg/dL — ABNORMAL LOW (ref 70–99)
HCT: 34 % — ABNORMAL LOW (ref 36.0–46.0)
Hemoglobin: 11.6 g/dL — ABNORMAL LOW (ref 12.0–15.0)
Potassium: 2.9 mmol/L — ABNORMAL LOW (ref 3.5–5.1)
Sodium: 146 mmol/L — ABNORMAL HIGH (ref 135–145)

## 2014-03-31 LAB — PROTIME-INR
INR: 1.29 (ref 0.00–1.49)
Prothrombin Time: 16.2 seconds — ABNORMAL HIGH (ref 11.6–15.2)

## 2014-03-31 LAB — HEMOGLOBIN AND HEMATOCRIT, BLOOD
HCT: 20.9 % — ABNORMAL LOW (ref 36.0–46.0)
Hemoglobin: 7 g/dL — ABNORMAL LOW (ref 12.0–15.0)

## 2014-03-31 LAB — APTT: aPTT: 48 seconds — ABNORMAL HIGH (ref 24–37)

## 2014-03-31 LAB — PREPARE RBC (CROSSMATCH)

## 2014-03-31 LAB — POCT I-STAT GLUCOSE
Glucose, Bld: 114 mg/dL — ABNORMAL HIGH (ref 70–99)
Glucose, Bld: 146 mg/dL — ABNORMAL HIGH (ref 70–99)
Operator id: 3406
Operator id: 3406

## 2014-03-31 LAB — FIBRINOGEN: Fibrinogen: 135 mg/dL — ABNORMAL LOW (ref 204–475)

## 2014-03-31 LAB — PLATELET COUNT: Platelets: 10 10*3/uL — CL (ref 150–400)

## 2014-03-31 SURGERY — BENTALL PROCEDURE
Anesthesia: General | Site: Chest

## 2014-03-31 MED ORDER — NITROGLYCERIN IN D5W 200-5 MCG/ML-% IV SOLN
0.0000 ug/min | INTRAVENOUS | Status: DC
Start: 2014-03-31 — End: 2014-04-03
  Administered 2014-04-01: 100 ug/min via INTRAVENOUS
  Filled 2014-03-31: qty 250

## 2014-03-31 MED ORDER — SUCCINYLCHOLINE CHLORIDE 20 MG/ML IJ SOLN
INTRAMUSCULAR | Status: AC
  Filled 2014-03-31: qty 1

## 2014-03-31 MED ORDER — PANTOPRAZOLE SODIUM 40 MG PO TBEC
40.0000 mg | DELAYED_RELEASE_TABLET | Freq: Every day | ORAL | Status: DC
  Administered 2014-04-02 – 2014-04-08 (×7): 40 mg via ORAL
  Filled 2014-03-31 (×5): qty 1

## 2014-03-31 MED ORDER — MIDAZOLAM HCL 2 MG/2ML IJ SOLN: 2.0000 mg | INTRAMUSCULAR | Status: DC | PRN

## 2014-03-31 MED ORDER — HEMOSTATIC AGENTS (NO CHARGE) OPTIME
TOPICAL | Status: DC | PRN
  Administered 2014-03-31: 1 via TOPICAL

## 2014-03-31 MED ORDER — ARTIFICIAL TEARS OP OINT
TOPICAL_OINTMENT | OPHTHALMIC | Status: DC | PRN
  Administered 2014-03-31: 1 via OPHTHALMIC

## 2014-03-31 MED ORDER — ACETAMINOPHEN 160 MG/5ML PO SOLN: 1000.0000 mg | Freq: Four times a day (QID) | ORAL | Status: DC

## 2014-03-31 MED ORDER — MILRINONE IN DEXTROSE 20 MG/100ML IV SOLN
INTRAVENOUS | Status: DC | PRN
  Administered 2014-03-31: .2 ug/kg/min via INTRAVENOUS

## 2014-03-31 MED ORDER — MILRINONE IN DEXTROSE 20 MG/100ML IV SOLN: 0.3000 ug/kg/min | INTRAVENOUS | Status: DC

## 2014-03-31 MED ORDER — VANCOMYCIN HCL IN DEXTROSE 1-5 GM/200ML-% IV SOLN
1000.0000 mg | INTRAVENOUS | Status: AC
  Administered 2014-03-31 – 2014-04-01 (×2): 1000 mg via INTRAVENOUS
  Filled 2014-03-31 (×2): qty 200

## 2014-03-31 MED ORDER — PROTAMINE SULFATE 10 MG/ML IV SOLN
INTRAVENOUS | Status: AC
  Filled 2014-03-31: qty 25

## 2014-03-31 MED ORDER — MILRINONE IN DEXTROSE 20 MG/100ML IV SOLN
0.1250 ug/kg/min | INTRAVENOUS | Status: DC
  Filled 2014-03-31: qty 100

## 2014-03-31 MED ORDER — SODIUM CHLORIDE 0.9 % IV SOLN: INTRAVENOUS | Status: DC

## 2014-03-31 MED ORDER — MIDAZOLAM HCL 5 MG/5ML IJ SOLN
INTRAMUSCULAR | Status: DC | PRN
  Administered 2014-03-31: 1 mg via INTRAVENOUS
  Administered 2014-03-31: 2 mg via INTRAVENOUS
  Administered 2014-03-31 (×2): 1 mg via INTRAVENOUS

## 2014-03-31 MED ORDER — ROCURONIUM BROMIDE 100 MG/10ML IV SOLN
INTRAVENOUS | Status: DC | PRN
  Administered 2014-03-31 (×2): 50 mg via INTRAVENOUS

## 2014-03-31 MED ORDER — PROPOFOL 10 MG/ML IV BOLUS
INTRAVENOUS | Status: AC
  Filled 2014-03-31: qty 20

## 2014-03-31 MED ORDER — SODIUM CHLORIDE 0.9 % IJ SOLN
3.0000 mL | Freq: Two times a day (BID) | INTRAMUSCULAR | Status: DC
  Administered 2014-04-01 – 2014-04-08 (×8): 3 mL via INTRAVENOUS

## 2014-03-31 MED ORDER — CETYLPYRIDINIUM CHLORIDE 0.05 % MT LIQD
7.0000 mL | Freq: Two times a day (BID) | OROMUCOSAL | Status: DC
  Administered 2014-04-01 – 2014-04-08 (×14): 7 mL via OROMUCOSAL

## 2014-03-31 MED ORDER — FAMOTIDINE IN NACL 20-0.9 MG/50ML-% IV SOLN
20.0000 mg | Freq: Two times a day (BID) | INTRAVENOUS | Status: AC
  Administered 2014-03-31 – 2014-04-01 (×2): 20 mg via INTRAVENOUS
  Filled 2014-03-31: qty 50

## 2014-03-31 MED ORDER — MAGNESIUM SULFATE 4 GM/100ML IV SOLN
4.0000 g | Freq: Once | INTRAVENOUS | Status: AC
  Administered 2014-03-31: 4 g via INTRAVENOUS
  Filled 2014-03-31: qty 100

## 2014-03-31 MED ORDER — DEXTROSE 50 % IV SOLN
INTRAVENOUS | Status: AC
  Administered 2014-03-31: 50 mL
  Filled 2014-03-31: qty 50

## 2014-03-31 MED ORDER — MORPHINE SULFATE 2 MG/ML IJ SOLN: 1.0000 mg | INTRAMUSCULAR | Status: AC | PRN

## 2014-03-31 MED ORDER — FENTANYL CITRATE 0.05 MG/ML IJ SOLN
INTRAMUSCULAR | Status: AC
  Filled 2014-03-31: qty 5

## 2014-03-31 MED ORDER — LACTATED RINGERS IV SOLN
INTRAVENOUS | Status: DC | PRN
  Administered 2014-03-31: 08:00:00 via INTRAVENOUS

## 2014-03-31 MED ORDER — ACETAMINOPHEN 160 MG/5ML PO SOLN
650.0000 mg | Freq: Once | ORAL | Status: AC
Start: 2014-03-31 — End: 2014-03-31

## 2014-03-31 MED ORDER — SODIUM CHLORIDE 0.9 % IJ SOLN: 3.0000 mL | INTRAMUSCULAR | Status: DC | PRN

## 2014-03-31 MED ORDER — LACTATED RINGERS IV SOLN: INTRAVENOUS | Status: DC

## 2014-03-31 MED ORDER — LACTATED RINGERS IV SOLN
INTRAVENOUS | Status: DC | PRN
  Administered 2014-03-31: 07:00:00 via INTRAVENOUS

## 2014-03-31 MED ORDER — SODIUM CHLORIDE 0.9 % IV SOLN: Freq: Once | INTRAVENOUS | Status: DC

## 2014-03-31 MED ORDER — LIDOCAINE HCL (CARDIAC) 20 MG/ML IV SOLN
INTRAVENOUS | Status: DC | PRN
  Administered 2014-03-31: 60 mg via INTRAVENOUS

## 2014-03-31 MED ORDER — MORPHINE SULFATE 2 MG/ML IJ SOLN
2.0000 mg | INTRAMUSCULAR | Status: DC | PRN
  Administered 2014-04-01 – 2014-04-02 (×6): 2 mg via INTRAVENOUS
  Filled 2014-03-31 (×6): qty 1

## 2014-03-31 MED ORDER — DEXMEDETOMIDINE HCL IN NACL 200 MCG/50ML IV SOLN: 0.0000 ug/kg/h | INTRAVENOUS | Status: DC

## 2014-03-31 MED ORDER — ROCURONIUM BROMIDE 50 MG/5ML IV SOLN
INTRAVENOUS | Status: AC
  Filled 2014-03-31: qty 4

## 2014-03-31 MED ORDER — GLYCOPYRROLATE 0.2 MG/ML IJ SOLN
INTRAMUSCULAR | Status: DC | PRN
  Administered 2014-03-31: 0.2 mg via INTRAVENOUS

## 2014-03-31 MED ORDER — SODIUM CHLORIDE 0.9 % IV SOLN
1.0000 g/h | INTRAVENOUS | Status: DC
  Filled 2014-03-31: qty 40

## 2014-03-31 MED ORDER — HEPARIN SODIUM (PORCINE) 1000 UNIT/ML IJ SOLN
INTRAMUSCULAR | Status: AC
  Filled 2014-03-31: qty 1

## 2014-03-31 MED ORDER — VANCOMYCIN HCL IN DEXTROSE 1-5 GM/200ML-% IV SOLN
1000.0000 mg | Freq: Two times a day (BID) | INTRAVENOUS | Status: DC
  Filled 2014-03-31 (×2): qty 200

## 2014-03-31 MED ORDER — HYDRALAZINE HCL 20 MG/ML IJ SOLN
10.0000 mg | INTRAMUSCULAR | Status: DC | PRN
  Administered 2014-04-01 – 2014-04-05 (×9): 10 mg via INTRAVENOUS
  Filled 2014-03-31 (×9): qty 1

## 2014-03-31 MED ORDER — ETOMIDATE 2 MG/ML IV SOLN
INTRAVENOUS | Status: AC
  Filled 2014-03-31: qty 10

## 2014-03-31 MED ORDER — STERILE WATER FOR INJECTION IJ SOLN
INTRAMUSCULAR | Status: AC
  Filled 2014-03-31: qty 10

## 2014-03-31 MED ORDER — SODIUM CHLORIDE 0.9 % IV SOLN
Freq: Once | INTRAVENOUS | Status: AC
  Administered 2014-03-31 (×2): via INTRAVENOUS

## 2014-03-31 MED ORDER — PROTAMINE SULFATE 10 MG/ML IV SOLN
INTRAVENOUS | Status: DC | PRN
  Administered 2014-03-31 (×3): 20 mg via INTRAVENOUS
  Administered 2014-03-31: 10 mg via INTRAVENOUS
  Administered 2014-03-31: 20 mg via INTRAVENOUS
  Administered 2014-03-31 (×2): 10 mg via INTRAVENOUS

## 2014-03-31 MED ORDER — METOPROLOL TARTRATE 25 MG/10 ML ORAL SUSPENSION
12.5000 mg | Freq: Two times a day (BID) | ORAL | Status: DC
  Filled 2014-03-31 (×3): qty 5

## 2014-03-31 MED ORDER — DOPAMINE-DEXTROSE 3.2-5 MG/ML-% IV SOLN: 0.0000 ug/kg/min | INTRAVENOUS | Status: DC

## 2014-03-31 MED ORDER — SODIUM CHLORIDE 0.9 % IR SOLN
Status: DC | PRN
  Administered 2014-03-31: 6000 mL

## 2014-03-31 MED ORDER — INSULIN REGULAR BOLUS VIA INFUSION
0.0000 [IU] | Freq: Three times a day (TID) | INTRAVENOUS | Status: DC
  Administered 2014-04-01: 1 [IU] via INTRAVENOUS
  Filled 2014-03-31: qty 10

## 2014-03-31 MED ORDER — OXYCODONE HCL 5 MG PO TABS: 5.0000 mg | ORAL_TABLET | ORAL | Status: DC | PRN

## 2014-03-31 MED ORDER — MIDAZOLAM HCL 10 MG/2ML IJ SOLN
INTRAMUSCULAR | Status: AC
  Filled 2014-03-31: qty 2

## 2014-03-31 MED ORDER — ALBUMIN HUMAN 5 % IV SOLN: 250.0000 mL | INTRAVENOUS | Status: AC | PRN

## 2014-03-31 MED ORDER — NITROPRUSSIDE SODIUM 25 MG/ML IV SOLN
0.0000 ug/kg/min | INTRAVENOUS | Status: DC
  Administered 2014-04-01: 0.3 ug/kg/min via INTRAVENOUS
  Filled 2014-03-31: qty 2

## 2014-03-31 MED ORDER — BISACODYL 5 MG PO TBEC
10.0000 mg | DELAYED_RELEASE_TABLET | Freq: Every day | ORAL | Status: DC
  Administered 2014-04-01 – 2014-04-03 (×3): 10 mg via ORAL
  Filled 2014-03-31 (×3): qty 2

## 2014-03-31 MED ORDER — EPHEDRINE SULFATE 50 MG/ML IJ SOLN
INTRAMUSCULAR | Status: AC
  Filled 2014-03-31: qty 1

## 2014-03-31 MED ORDER — POTASSIUM CHLORIDE 10 MEQ/50ML IV SOLN
10.0000 meq | INTRAVENOUS | Status: AC
  Administered 2014-03-31 (×3): 10 meq via INTRAVENOUS

## 2014-03-31 MED ORDER — HYDRALAZINE HCL 20 MG/ML IJ SOLN
INTRAMUSCULAR | Status: AC
  Filled 2014-03-31: qty 1

## 2014-03-31 MED ORDER — METHYLPREDNISOLONE SODIUM SUCC 125 MG IJ SOLR
INTRAMUSCULAR | Status: AC
  Filled 2014-03-31: qty 2

## 2014-03-31 MED ORDER — SODIUM CHLORIDE 0.45 % IV SOLN
INTRAVENOUS | Status: DC
  Administered 2014-03-31: 20 mL/h via INTRAVENOUS

## 2014-03-31 MED ORDER — DEXTROSE 5 % IV SOLN
1.5000 g | Freq: Two times a day (BID) | INTRAVENOUS | Status: AC
  Administered 2014-03-31 – 2014-04-02 (×4): 1.5 g via INTRAVENOUS
  Filled 2014-03-31 (×4): qty 1.5

## 2014-03-31 MED ORDER — HEPARIN SODIUM (PORCINE) 1000 UNIT/ML IJ SOLN
INTRAMUSCULAR | Status: DC | PRN
  Administered 2014-03-31: 10000 [IU] via INTRAVENOUS
  Administered 2014-03-31: 4000 [IU] via INTRAVENOUS

## 2014-03-31 MED ORDER — INSULIN REGULAR HUMAN 100 UNIT/ML IJ SOLN
INTRAMUSCULAR | Status: DC
  Administered 2014-03-31: 1.4 [IU]/h via INTRAVENOUS
  Filled 2014-03-31: qty 2.5

## 2014-03-31 MED ORDER — METOPROLOL TARTRATE 1 MG/ML IV SOLN
2.5000 mg | INTRAVENOUS | Status: DC | PRN
  Administered 2014-04-06 – 2014-04-08 (×2): 5 mg via INTRAVENOUS
  Filled 2014-03-31 (×2): qty 5

## 2014-03-31 MED ORDER — PHENYLEPHRINE 40 MCG/ML (10ML) SYRINGE FOR IV PUSH (FOR BLOOD PRESSURE SUPPORT)
PREFILLED_SYRINGE | INTRAVENOUS | Status: AC
  Filled 2014-03-31: qty 10

## 2014-03-31 MED ORDER — DEXTROSE 50 % IV SOLN
18.0000 mL | Freq: Once | INTRAVENOUS | Status: AC
  Administered 2014-03-31: 18 mL via INTRAVENOUS
  Filled 2014-03-31: qty 50

## 2014-03-31 MED ORDER — FENTANYL CITRATE 0.05 MG/ML IJ SOLN
INTRAMUSCULAR | Status: DC | PRN
  Administered 2014-03-31 (×2): 100 ug via INTRAVENOUS
  Administered 2014-03-31: 150 ug via INTRAVENOUS
  Administered 2014-03-31: 200 ug via INTRAVENOUS
  Administered 2014-03-31: 150 ug via INTRAVENOUS
  Administered 2014-03-31: 100 ug via INTRAVENOUS
  Administered 2014-03-31: 50 ug via INTRAVENOUS
  Administered 2014-03-31 (×5): 100 ug via INTRAVENOUS
  Administered 2014-03-31: 150 ug via INTRAVENOUS

## 2014-03-31 MED ORDER — LACTATED RINGERS IV SOLN: 500.0000 mL | Freq: Once | INTRAVENOUS | Status: DC | PRN

## 2014-03-31 MED ORDER — VANCOMYCIN HCL IN DEXTROSE 1-5 GM/200ML-% IV SOLN
1000.0000 mg | Freq: Once | INTRAVENOUS | Status: DC
  Filled 2014-03-31: qty 200

## 2014-03-31 MED ORDER — NOREPINEPHRINE BITARTRATE 1 MG/ML IV SOLN
0.0000 ug/min | INTRAVENOUS | Status: DC
  Filled 2014-03-31: qty 4

## 2014-03-31 MED ORDER — ASPIRIN EC 325 MG PO TBEC
325.0000 mg | DELAYED_RELEASE_TABLET | Freq: Every day | ORAL | Status: DC
  Administered 2014-04-01 – 2014-04-08 (×8): 325 mg via ORAL
  Filled 2014-03-31 (×8): qty 1

## 2014-03-31 MED ORDER — DEXTROSE 50 % IV SOLN
INTRAVENOUS | Status: AC
Start: 2014-03-31 — End: 2014-03-31
  Filled 2014-03-31: qty 50

## 2014-03-31 MED ORDER — DOCUSATE SODIUM 100 MG PO CAPS
200.0000 mg | ORAL_CAPSULE | Freq: Every day | ORAL | Status: DC
  Administered 2014-04-01 – 2014-04-03 (×3): 200 mg via ORAL
  Filled 2014-03-31 (×4): qty 2

## 2014-03-31 MED ORDER — POTASSIUM CHLORIDE 10 MEQ/50ML IV SOLN
10.0000 meq | Freq: Once | INTRAVENOUS | Status: AC
  Administered 2014-03-31: 10 meq via INTRAVENOUS

## 2014-03-31 MED ORDER — ETOMIDATE 2 MG/ML IV SOLN
INTRAVENOUS | Status: DC | PRN
  Administered 2014-03-31: 8 mg via INTRAVENOUS

## 2014-03-31 MED ORDER — ACETAMINOPHEN 500 MG PO TABS
1000.0000 mg | ORAL_TABLET | Freq: Four times a day (QID) | ORAL | Status: AC
  Administered 2014-04-01 – 2014-04-05 (×17): 1000 mg via ORAL
  Filled 2014-03-31 (×20): qty 2

## 2014-03-31 MED ORDER — SODIUM CHLORIDE 0.9 % IV SOLN: 250.0000 mL | INTRAVENOUS | Status: DC

## 2014-03-31 MED ORDER — SODIUM CHLORIDE 0.9 % IJ SOLN
OROMUCOSAL | Status: DC | PRN
  Administered 2014-03-31: 12 mL via TOPICAL

## 2014-03-31 MED ORDER — ALBUMIN HUMAN 5 % IV SOLN
INTRAVENOUS | Status: DC | PRN
  Administered 2014-03-31: 16:00:00 via INTRAVENOUS

## 2014-03-31 MED ORDER — ARTIFICIAL TEARS OP OINT
TOPICAL_OINTMENT | OPHTHALMIC | Status: AC
  Filled 2014-03-31: qty 3.5

## 2014-03-31 MED ORDER — METHYLPREDNISOLONE SODIUM SUCC 125 MG IJ SOLR
INTRAMUSCULAR | Status: DC | PRN
  Administered 2014-03-31: 125 mg via INTRAVENOUS

## 2014-03-31 MED ORDER — ACETAMINOPHEN 650 MG RE SUPP
650.0000 mg | Freq: Once | RECTAL | Status: AC
  Administered 2014-03-31: 650 mg via RECTAL

## 2014-03-31 MED ORDER — PHENYLEPHRINE HCL 10 MG/ML IJ SOLN
20.0000 mg | INTRAVENOUS | Status: DC | PRN
  Administered 2014-03-31: 20 ug/min via INTRAVENOUS

## 2014-03-31 MED ORDER — EPHEDRINE SULFATE 50 MG/ML IJ SOLN
INTRAMUSCULAR | Status: DC | PRN
  Administered 2014-03-31: 10 mg via INTRAVENOUS
  Administered 2014-03-31: 5 mg via INTRAVENOUS

## 2014-03-31 MED ORDER — TRAMADOL HCL 50 MG PO TABS
50.0000 mg | ORAL_TABLET | ORAL | Status: DC | PRN
  Administered 2014-04-01: 100 mg via ORAL
  Filled 2014-03-31: qty 2

## 2014-03-31 MED ORDER — METOPROLOL TARTRATE 12.5 MG HALF TABLET
12.5000 mg | ORAL_TABLET | Freq: Two times a day (BID) | ORAL | Status: DC
  Filled 2014-03-31 (×3): qty 1

## 2014-03-31 MED ORDER — PHENYLEPHRINE HCL 10 MG/ML IJ SOLN
0.0000 ug/min | INTRAVENOUS | Status: DC
  Filled 2014-03-31: qty 2

## 2014-03-31 MED ORDER — ASPIRIN 81 MG PO CHEW: 324.0000 mg | CHEWABLE_TABLET | Freq: Every day | ORAL | Status: DC

## 2014-03-31 MED ORDER — PROPOFOL 10 MG/ML IV BOLUS
INTRAVENOUS | Status: DC | PRN
  Administered 2014-03-31: 30 mg via INTRAVENOUS
  Administered 2014-03-31: 50 mg via INTRAVENOUS

## 2014-03-31 MED ORDER — BISACODYL 10 MG RE SUPP: 10.0000 mg | Freq: Every day | RECTAL | Status: DC

## 2014-03-31 MED ORDER — ONDANSETRON HCL 4 MG/2ML IJ SOLN
4.0000 mg | Freq: Four times a day (QID) | INTRAMUSCULAR | Status: DC | PRN
  Administered 2014-04-01 – 2014-04-02 (×3): 4 mg via INTRAVENOUS
  Filled 2014-03-31 (×3): qty 2

## 2014-03-31 MED ORDER — HEMOSTATIC AGENTS (NO CHARGE) OPTIME
TOPICAL | Status: DC | PRN
  Administered 2014-03-31 (×2): 1 via TOPICAL

## 2014-03-31 SURGICAL SUPPLY — 138 items
ADAPTER CARDIO PERF ANTE/RETRO (ADAPTER) ×3 IMPLANT
ADH SRG 12 PREFL SYR 3 SPRDR (MISCELLANEOUS)
ADPR PRFSN 84XANTGRD RTRGD (ADAPTER) ×2
APL SRG 7X2 LUM MLBL SLNT (VASCULAR PRODUCTS) ×18
APPLICATOR TIP COSEAL (VASCULAR PRODUCTS) ×9 IMPLANT
ATTRACTOMAT 16X20 MAGNETIC DRP (DRAPES) ×2 IMPLANT
BAG DECANTER FOR FLEXI CONT (MISCELLANEOUS) ×3 IMPLANT
BLADE MINI RND TIP GREEN BEAV (BLADE) ×1 IMPLANT
BLADE NDL 3 SS STRL (BLADE) IMPLANT
BLADE NEEDLE 3 SS STRL (BLADE) ×3 IMPLANT
BLADE STERNUM SYSTEM 6 (BLADE) ×3 IMPLANT
BLADE SURG 15 STRL LF DISP TIS (BLADE) IMPLANT
BLADE SURG 15 STRL SS (BLADE) ×3
CABLE PACING FASLOC BLUE (MISCELLANEOUS) ×1 IMPLANT
CANISTER SUCTION 2500CC (MISCELLANEOUS) ×3 IMPLANT
CANNULA EZ GLIDE AORTIC 21FR (CANNULA) ×1 IMPLANT
CANNULA GRAFT 8MMX50CM (Graft) ×1 IMPLANT
CANNULA GUNDRY RCSP 15FR (MISCELLANEOUS) ×4 IMPLANT
CANNULA SUMP PERICARDIAL (CANNULA) ×1 IMPLANT
CATH HEART VENT LEFT (CATHETERS) IMPLANT
CATH RETROPLEGIA CORONARY 14FR (CATHETERS) IMPLANT
CATH ROBINSON RED A/P 18FR (CATHETERS) ×3 IMPLANT
CATH/SQUID NICHOLS JEHLE COR (CATHETERS) ×1 IMPLANT
CAUTERY EYE LOW TEMP 1300F FIN (OPHTHALMIC RELATED) ×3 IMPLANT
CLIP FOGARTY SPRING 6M (CLIP) IMPLANT
CONN Y 3/8X3/8X3/8  BEN (MISCELLANEOUS) ×1
CONN Y 3/8X3/8X3/8 BEN (MISCELLANEOUS) IMPLANT
CONT SPECI 4OZ STER CLIK (MISCELLANEOUS) ×3 IMPLANT
COVER MAYO STAND STRL (DRAPES) ×1 IMPLANT
COVER SURGICAL LIGHT HANDLE (MISCELLANEOUS) ×5 IMPLANT
CRADLE DONUT ADULT HEAD (MISCELLANEOUS) ×3 IMPLANT
DRAPE CARDIOVASCULAR INCISE (DRAPES) ×3
DRAPE PROXIMA HALF (DRAPES) ×1 IMPLANT
DRAPE SLUSH/WARMER DISC (DRAPES) ×1 IMPLANT
DRAPE SRG 135X102X78XABS (DRAPES) ×2 IMPLANT
DRSG AQUACEL AG ADV 3.5X14 (GAUZE/BANDAGES/DRESSINGS) ×7 IMPLANT
ELECT BLADE 4.0 EZ CLEAN MEGAD (MISCELLANEOUS) ×3
ELECT CAUTERY BLADE 6.4 (BLADE) ×1 IMPLANT
ELECT REM PT RETURN 9FT ADLT (ELECTROSURGICAL) ×6
ELECTRODE BLDE 4.0 EZ CLN MEGD (MISCELLANEOUS) IMPLANT
ELECTRODE REM PT RTRN 9FT ADLT (ELECTROSURGICAL) ×4 IMPLANT
GAUZE SPONGE 4X4 12PLY STRL (GAUZE/BANDAGES/DRESSINGS) ×6 IMPLANT
GLOVE BIO SURGEON STRL SZ 6 (GLOVE) ×3 IMPLANT
GLOVE BIO SURGEON STRL SZ 6.5 (GLOVE) ×2 IMPLANT
GLOVE BIO SURGEON STRL SZ7 (GLOVE) ×3 IMPLANT
GLOVE BIO SURGEON STRL SZ7.5 (GLOVE) ×4 IMPLANT
GLOVE BIOGEL PI IND STRL 6 (GLOVE) IMPLANT
GLOVE BIOGEL PI IND STRL 6.5 (GLOVE) IMPLANT
GLOVE BIOGEL PI IND STRL 7.0 (GLOVE) IMPLANT
GLOVE BIOGEL PI IND STRL 8 (GLOVE) IMPLANT
GLOVE BIOGEL PI INDICATOR 6 (GLOVE) ×2
GLOVE BIOGEL PI INDICATOR 6.5 (GLOVE) ×4
GLOVE BIOGEL PI INDICATOR 7.0 (GLOVE) ×2
GLOVE BIOGEL PI INDICATOR 8 (GLOVE) ×1
GOWN STRL REUS W/ TWL LRG LVL3 (GOWN DISPOSABLE) ×8 IMPLANT
GOWN STRL REUS W/TWL LRG LVL3 (GOWN DISPOSABLE) ×42
GRAFT AORT ARC 14X10X10MM TRIF (Prosthesis & Implant Heart) ×1 IMPLANT
GRAFT GELWEAVE VALSALVA 26 (Prosthesis & Implant Heart) IMPLANT
GRAFT GELWEAVE VALSALVA 26CM (Prosthesis & Implant Heart) ×3 IMPLANT
GRAFT WOVEN D/V 28DX30L (Vascular Products) ×1 IMPLANT
HANDLE STAPLE ENDO GIA SHORT (STAPLE) ×1
HEMOSTAT POWDER SURGIFOAM 1G (HEMOSTASIS) ×3 IMPLANT
HEMOSTAT SURGICEL 2X14 (HEMOSTASIS) ×2 IMPLANT
INSERT FOGARTY SM (MISCELLANEOUS) ×1 IMPLANT
INSERT FOGARTY XLG (MISCELLANEOUS) ×1 IMPLANT
KIT BASIN OR (CUSTOM PROCEDURE TRAY) ×3 IMPLANT
KIT ROOM TURNOVER OR (KITS) ×3 IMPLANT
KIT SUCTION CATH 14FR (SUCTIONS) ×1 IMPLANT
LEAD PACING MYOCARDI (MISCELLANEOUS) ×1 IMPLANT
LINE VENT (MISCELLANEOUS) ×1 IMPLANT
LOOP VESSEL MINI RED (MISCELLANEOUS) ×2 IMPLANT
LOOP VESSEL SUPERMAXI WHITE (MISCELLANEOUS) ×1 IMPLANT
MARKER GRAFT CORONARY BYPASS (MISCELLANEOUS) IMPLANT
NEEDLE AORTIC AIR ASPIRATING (NEEDLE) ×1 IMPLANT
NS IRRIG 1000ML POUR BTL (IV SOLUTION) ×16 IMPLANT
PACK OPEN HEART (CUSTOM PROCEDURE TRAY) ×3 IMPLANT
PAD ARMBOARD 7.5X6 YLW CONV (MISCELLANEOUS) ×6 IMPLANT
PENCIL BUTTON HOLSTER BLD 10FT (ELECTRODE) IMPLANT
RELOAD ENDO GIA 30 2.5 (STAPLE) ×2 IMPLANT
SEALANT SURG COSEAL 8ML (VASCULAR PRODUCTS) ×3 IMPLANT
SET CARDIOPLEGIA MPS 5001102 (MISCELLANEOUS) ×1 IMPLANT
SPONGE GAUZE 4X4 12PLY STER LF (GAUZE/BANDAGES/DRESSINGS) ×1 IMPLANT
SPONGE LAP 18X18 X RAY DECT (DISPOSABLE) ×2 IMPLANT
SPONGE LAP 4X18 X RAY DECT (DISPOSABLE) ×1 IMPLANT
STAPLER ENDO GIA 12 SHRT THIN (STAPLE) IMPLANT
STAPLER ENDO GIA 12MM SHORT (STAPLE) ×2 IMPLANT
STAPLER VISISTAT 35W (STAPLE) ×1 IMPLANT
STOPCOCK 4 WAY LG BORE MALE ST (IV SETS) ×1 IMPLANT
SURGIFLO W/THROMBIN 8M KIT (HEMOSTASIS) ×1 IMPLANT
SUT ETHIBON 2 0 V 52N 30 (SUTURE) ×3 IMPLANT
SUT ETHIBON EXCEL 2-0 V-5 (SUTURE) IMPLANT
SUT ETHIBOND 2 0 SH (SUTURE) ×12
SUT ETHIBOND 2 0 SH 36X2 (SUTURE) IMPLANT
SUT ETHIBOND 2 0 V4 (SUTURE) IMPLANT
SUT ETHIBOND 2 0V4 GREEN (SUTURE) IMPLANT
SUT ETHIBOND 4 0 RB 1 (SUTURE) IMPLANT
SUT ETHIBOND V-5 VALVE (SUTURE) ×2 IMPLANT
SUT PROLENE 3 0 SH 1 (SUTURE) IMPLANT
SUT PROLENE 3 0 SH DA (SUTURE) ×3 IMPLANT
SUT PROLENE 4 0 RB 1 (SUTURE) ×93
SUT PROLENE 4 0 SH DA (SUTURE) ×4 IMPLANT
SUT PROLENE 4-0 RB1 .5 CRCL 36 (SUTURE) IMPLANT
SUT PROLENE 5 0 C 1 24 (SUTURE) ×4 IMPLANT
SUT PROLENE 5 0 C 1 36 (SUTURE) ×28 IMPLANT
SUT PROLENE 6 0 C 1 30 (SUTURE) ×5 IMPLANT
SUT PROLENE 6 0 CC (SUTURE) ×6 IMPLANT
SUT SILK  1 MH (SUTURE) ×3
SUT SILK 1 MH (SUTURE) ×4 IMPLANT
SUT SILK 1 TIES 10X30 (SUTURE) ×3 IMPLANT
SUT SILK 2 0 (SUTURE) ×3
SUT SILK 2 0 SH CR/8 (SUTURE) ×7 IMPLANT
SUT SILK 2-0 18XBRD TIE 12 (SUTURE) ×2 IMPLANT
SUT SILK 3 0 SH CR/8 (SUTURE) ×3 IMPLANT
SUT SILK 4 0 (SUTURE) ×3
SUT SILK 4-0 18XBRD TIE 12 (SUTURE) ×2 IMPLANT
SUT STEEL 6MS V (SUTURE) ×2 IMPLANT
SUT STEEL SZ 6 DBL 3X14 BALL (SUTURE) ×1 IMPLANT
SUT TEM PAC WIRE 2 0 SH (SUTURE) ×12 IMPLANT
SUT VIC AB 1 CTX 18 (SUTURE) ×1 IMPLANT
SUT VIC AB 1 CTX 36 (SUTURE) ×6
SUT VIC AB 1 CTX36XBRD ANBCTR (SUTURE) IMPLANT
SUT VIC AB 2-0 CTX 27 (SUTURE) ×7 IMPLANT
SUT VIC AB 3-0 X1 27 (SUTURE) ×2 IMPLANT
SYR 10ML KIT SKIN ADHESIVE (MISCELLANEOUS) IMPLANT
SYSTEM SAHARA CHEST DRAIN ATS (WOUND CARE) ×3 IMPLANT
TAPE CLOTH SURG 4X10 WHT LF (GAUZE/BANDAGES/DRESSINGS) ×1 IMPLANT
TAPE PAPER 2X10 WHT MICROPORE (GAUZE/BANDAGES/DRESSINGS) ×1 IMPLANT
TOWEL OR 17X24 6PK STRL BLUE (TOWEL DISPOSABLE) ×3 IMPLANT
TOWEL OR 17X26 10 PK STRL BLUE (TOWEL DISPOSABLE) ×4 IMPLANT
TRAY CATH LUMEN 1 20CM STRL (SET/KITS/TRAYS/PACK) ×1 IMPLANT
TRAY FOLEY IC TEMP SENS 14FR (CATHETERS) ×3 IMPLANT
TUBING ART PRESS 48 MALE/FEM (TUBING) ×2 IMPLANT
TUBING BULK SUCTION (MISCELLANEOUS) ×1 IMPLANT
UNDERPAD 30X30 INCONTINENT (UNDERPADS AND DIAPERS) ×3 IMPLANT
VALVE MAGNA EASE AORTIC 23MM (Prosthesis & Implant Heart) ×1 IMPLANT
VENT LEFT HEART 12002 (CATHETERS) ×3
WATER STERILE IRR 1000ML POUR (IV SOLUTION) ×6 IMPLANT
YANKAUER SUCT BULB TIP NO VENT (SUCTIONS) ×2 IMPLANT

## 2014-03-31 NOTE — Progress Notes (Signed)
  Echocardiogram Echocardiogram Transesophageal has been performed.  Fielding Mault FRANCES 03/31/2014, 10:09 AM

## 2014-03-31 NOTE — Progress Notes (Signed)
ANTIBIOTIC CONSULT NOTE: post-op Vancomycin  No Known Allergies  Patient Measurements: Height: 5\' 1"  (154.9 cm) Weight: 107 lb (48.535 kg) IBW/kg (Calculated) : 47.8  Labs:  Recent Labs  03/31/14 1008 03/31/14 1404 03/31/14 1408 03/31/14 1557 03/31/14 1750 03/31/14 1753  WBC  --   --   --   --  6.6  --   HGB 8.5* 7.0* 7.1* 8.2* 11.9* 11.6*  PLT  --  10*  --   --  148*  --   CREATININE 0.40*  --  0.40* 0.40*  --   --    Estimated Creatinine Clearance: 45.8 mL/min (by C-G formula based on Cr of 0.4).   Assessment:   74 yr old female s/p Bio-Bentall procedure.   Vancomycin 1250 mg IV given pre-op ~7:30am.   Vanc 1 gram IV q12hrs x 3 doses ordered post-op.  High-dose for small body size.  Plan:    Post-op Vanc dose adjusted to 1 gram IV q24hrs x 2 doses. First dose tonight, as planned.   Zinacef 1.5 gm IV q12hrs x 4 doses as ordered.  Arty Baumgartner, [place] Pager: [phone] 03/31/2014,8:56 PM

## 2014-03-31 NOTE — Anesthesia Preprocedure Evaluation (Addendum)
Anesthesia Evaluation  Patient identified by MRN, date of birth, ID band Patient awake    Reviewed: Allergy & Precautions, NPO status , Patient's Chart, lab work & pertinent test results, reviewed documented beta blocker date and time   Airway Mallampati: II  TM Distance: >3 FB Neck ROM: Full    Dental  (+) Dental Advisory Given, Teeth Intact   Pulmonary  breath sounds clear to auscultation        Cardiovascular hypertension, Pt. on medications + Peripheral Vascular Disease + Valvular Problems/Murmurs AI Rhythm:Regular Rate:Normal + Systolic murmurs    Neuro/Psych  Headaches, PSYCHIATRIC DISORDERS Anxiety    GI/Hepatic   Endo/Other    Renal/GU      Musculoskeletal   Abdominal   Peds  Hematology   Anesthesia Other Findings   Reproductive/Obstetrics                           Anesthesia Physical Anesthesia Plan  ASA: IV  Anesthesia Plan: General   Post-op Pain Management:    Induction: Intravenous  Airway Management Planned: Oral ETT  Additional Equipment: Arterial line, CVP, PA Cath, 3D TEE and Ultrasound Guidance Line Placement  Intra-op Plan:   Post-operative Plan: Post-operative intubation/ventilation  Informed Consent: I have reviewed the patients History and Physical, chart, labs and discussed the procedure including the risks, benefits and alternatives for the proposed anesthesia with the patient or authorized representative who has indicated his/her understanding and acceptance.   Dental advisory given  Plan Discussed with: CRNA, Surgeon and Anesthesiologist  Anesthesia Plan Comments:         Anesthesia Quick Evaluation

## 2014-03-31 NOTE — Progress Notes (Signed)
S/p Bio-Bentall/ Ascending and Arch replacement  Just back from OR  Intubated/ sedated  BP 184/41 mmHg  Pulse 60  Temp(Src) 96.6 F (35.9 C) (Oral)  Resp 23  Ht 5\' 1"  (1.549 m)  Wt 107 lb (48.535 kg)  BMI 20.23 kg/m2  SpO2 98%   Intake/Output Summary (Last 24 hours) at 03/31/14 1753 Last data filed at 03/31/14 1722  Gross per 24 hour  Intake   5123 ml  Output   1910 ml  Net   3213 ml   Minimal output from CT  Stable hemodynamics

## 2014-03-31 NOTE — Anesthesia Procedure Notes (Addendum)
Procedure Name: Intubation Date/Time: 03/31/2014 7:49 AM Performed by: Garrison Columbus T Pre-anesthesia Checklist: Patient identified, Emergency Drugs available, Suction available and Patient being monitored Patient Re-evaluated:Patient Re-evaluated prior to inductionOxygen Delivery Method: Circle system utilized Preoxygenation: Pre-oxygenation with 100% oxygen Intubation Type: IV induction Ventilation: Mask ventilation without difficulty Laryngoscope Size: Miller and 2 Grade View: Grade I Tube type: Oral Tube size: 8.0 mm Number of attempts: 1 Airway Equipment and Method: Stylet Placement Confirmation: ETT inserted through vocal cords under direct vision,  positive ETCO2 and breath sounds checked- equal and bilateral Secured at: 21 cm Tube secured with: Tape Dental Injury: Teeth and Oropharynx as per pre-operative assessment       CVP/PAC

## 2014-03-31 NOTE — OR Nursing (Signed)
2nd call to 2300 at 1633

## 2014-03-31 NOTE — Transfer of Care (Signed)
Immediate Anesthesia Transfer of Care Note  Patient: Yvonne Ballard  Procedure(s) Performed: Procedure(s): BIO-BENTALL PROCEDURE using a 70mm Magna Ease Aortic Tissue Valve, a 26 mm Terumo Gelweave Valsalva Graft, a 14x10x78mm Terumo Gelweave Graft, and a 20mm straight Hemashield Platinum Graft. (N/A) TRANSESOPHAGEAL ECHOCARDIOGRAM (TEE) (N/A)  Patient Location: SICU  Anesthesia Type:General  Level of Consciousness: sedated and Patient remains intubated per anesthesia plan  Airway & Oxygen Therapy: Patient remains intubated per anesthesia plan and Patient placed on Ventilator (see vital sign flow sheet for setting)  Post-op Assessment: Report given to PACU RN and Post -op Vital signs reviewed and stable  Post vital signs: Reviewed and stable  Complications: No apparent anesthesia complications

## 2014-03-31 NOTE — Progress Notes (Signed)
Initiated rapid heart wean protocol per RN. Pt following commands at this time

## 2014-03-31 NOTE — Progress Notes (Signed)
The patient was examined and preop studies reviewed. There has been no change from the prior exam and the patient is ready for surgery.  Plan bio-Bentall, arch replacement on B Lemarr

## 2014-03-31 NOTE — Brief Op Note (Signed)
03/31/2014       [address]       [place] [address]             [phone]     03/31/2014  2:04 PM  PATIENT:  Yvonne Ballard  74 y.o. female  PRE-OPERATIVE DIAGNOSIS:  AI AORTIC ROOT ANEURYSM  POST-OPERATIVE DIAGNOSIS:  AI AORTIC ROOT ANEURYSM  PROCEDURE:  Procedure(s): Ascending TAA repair BIO-BENTALL PROCEDURE using a 71mm Magna Ease Aortic Tissue Valve, a 26 mm Terumo Gelweave Valsalva Graft, a 14x10x41mm Terumo Gelweave Graft, and a 68mm straight Hemashield Platinum Graft. Including Aorto-Brachiocephalic bypass, Aorto-Left common carotid bypass TRANSESOPHAGEAL ECHOCARDIOGRAM (TEE)  SURGEON:  Surgeon(s): Ivin Poot, MD  PHYSICIAN ASSISTANT: Jadene Pierini PA-C  ANESTHESIA:   general  PATIENT CONDITION:  ICU - intubated and hemodynamically stable.  PRE-OPERATIVE WEIGHT: 48kg  EBL: see anest/perfusion records  Complications: no Known  Aortic Valve  Procedure Performed:  Replacement: Yes.  Bioprosthetic Valve. Implant Model Number:[id number], Size:23, Unique Device Identifier:[id number]  Repair/Reconstruction: No.   Aortic Annular Enlargement: No.    Aortic Valve Etiology   Aortic Insufficiency:  Severe  Aortic Valve Disease:  Yes.  Aortic Stenosis:  No.  Etiology (Choose at least one and up to  5 etiologies):  Primary Aortic Disease, Atherosclerotic Aneurysm

## 2014-03-31 NOTE — OR Nursing (Signed)
First call to 2300 at 1551

## 2014-03-31 NOTE — Progress Notes (Signed)
Pt's BP 184/41 and then 181/35.  Spoke with Verdene Lennert CRNA and she spoke with Dr Ola Spurr who suggested we hold of on the Metoprolol at this time.  Dr Orene Desanctis to make final decision.

## 2014-03-31 NOTE — Procedures (Signed)
Extubation Procedure Note  Patient Details:   Name: Yvonne Ballard DOB: [birth year] MRN: [id number]   Airway Documentation:  Airway 8 mm (Active)  Secured at (cm) 21 cm 03/31/2014 12:00 AM    Evaluation  O2 sats: stable throughout Complications: No apparent complications Patient did tolerate procedure well. Bilateral Breath Sounds: Clear, Diminished Suctioning: Oral, Airway Yes   Pt extubated per Rapid Heart Wean Protocol. Pt placed on 3L [place]. NIF -25, VC 0.9, ABG within normal limits. Pt able to speak name and has a good cough post extubation. Pt stable throughout with no complications. RT will continue to monitor.   Jesse Sans 03/31/2014, 11:10 PM

## 2014-04-01 ENCOUNTER — Encounter (HOSPITAL_COMMUNITY): Payer: Self-pay | Admitting: Cardiothoracic Surgery

## 2014-04-01 ENCOUNTER — Inpatient Hospital Stay (HOSPITAL_COMMUNITY): Payer: Medicare Other

## 2014-04-01 LAB — POCT I-STAT 3, ART BLOOD GAS (G3+)
Acid-Base Excess: 1 mmol/L (ref 0.0–2.0)
Bicarbonate: 26.6 mEq/L — ABNORMAL HIGH (ref 20.0–24.0)
Bicarbonate: 26.7 mEq/L — ABNORMAL HIGH (ref 20.0–24.0)
O2 Saturation: 96 %
O2 Saturation: 97 %
Patient temperature: 97.6
Patient temperature: 98.6
TCO2: 28 mmol/L (ref 0–100)
TCO2: 28 mmol/L (ref 0–100)
pCO2 arterial: 44.6 mmHg (ref 35.0–45.0)
pCO2 arterial: 49.9 mmHg — ABNORMAL HIGH (ref 35.0–45.0)
pH, Arterial: 7.333 — ABNORMAL LOW (ref 7.350–7.450)
pH, Arterial: 7.385 (ref 7.350–7.450)
pO2, Arterial: 88 mmHg (ref 80.0–100.0)
pO2, Arterial: 88 mmHg (ref 80.0–100.0)

## 2014-04-01 LAB — CBC
HCT: 34.6 % — ABNORMAL LOW (ref 36.0–46.0)
HCT: 34.6 % — ABNORMAL LOW (ref 36.0–46.0)
HEMOGLOBIN: 11.8 g/dL — AB (ref 12.0–15.0)
HEMOGLOBIN: 11.8 g/dL — AB (ref 12.0–15.0)
MCH: 30 pg (ref 26.0–34.0)
MCH: 30.1 pg (ref 26.0–34.0)
MCHC: 34.1 g/dL (ref 30.0–36.0)
MCHC: 34.1 g/dL (ref 30.0–36.0)
MCV: 88 fL (ref 78.0–100.0)
MCV: 88.3 fL (ref 78.0–100.0)
PLATELETS: 169 10*3/uL (ref 150–400)
Platelets: 183 10*3/uL (ref 150–400)
RBC: 3.93 MIL/uL (ref 3.87–5.11)
RBC: 3.92 MIL/uL (ref 3.87–5.11)
RDW: 14.5 % (ref 11.5–15.5)
RDW: 15.1 % (ref 11.5–15.5)
WBC: 10.7 10*3/uL — AB (ref 4.0–10.5)
WBC: 13.5 10*3/uL — ABNORMAL HIGH (ref 4.0–10.5)

## 2014-04-01 LAB — GLUCOSE, CAPILLARY
Glucose-Capillary: 100 mg/dL — ABNORMAL HIGH (ref 70–99)
Glucose-Capillary: 108 mg/dL — ABNORMAL HIGH (ref 70–99)
Glucose-Capillary: 118 mg/dL — ABNORMAL HIGH (ref 70–99)
Glucose-Capillary: 118 mg/dL — ABNORMAL HIGH (ref 70–99)
Glucose-Capillary: 126 mg/dL — ABNORMAL HIGH (ref 70–99)
Glucose-Capillary: 133 mg/dL — ABNORMAL HIGH (ref 70–99)
Glucose-Capillary: 134 mg/dL — ABNORMAL HIGH (ref 70–99)
Glucose-Capillary: 135 mg/dL — ABNORMAL HIGH (ref 70–99)
Glucose-Capillary: 138 mg/dL — ABNORMAL HIGH (ref 70–99)
Glucose-Capillary: 150 mg/dL — ABNORMAL HIGH (ref 70–99)
Glucose-Capillary: 155 mg/dL — ABNORMAL HIGH (ref 70–99)
Glucose-Capillary: 157 mg/dL — ABNORMAL HIGH (ref 70–99)
Glucose-Capillary: 159 mg/dL — ABNORMAL HIGH (ref 70–99)
Glucose-Capillary: 166 mg/dL — ABNORMAL HIGH (ref 70–99)
Glucose-Capillary: 171 mg/dL — ABNORMAL HIGH (ref 70–99)
Glucose-Capillary: 174 mg/dL — ABNORMAL HIGH (ref 70–99)
Glucose-Capillary: 181 mg/dL — ABNORMAL HIGH (ref 70–99)
Glucose-Capillary: 188 mg/dL — ABNORMAL HIGH (ref 70–99)
Glucose-Capillary: 195 mg/dL — ABNORMAL HIGH (ref 70–99)
Glucose-Capillary: 54 mg/dL — ABNORMAL LOW (ref 70–99)
Glucose-Capillary: 60 mg/dL — ABNORMAL LOW (ref 70–99)
Glucose-Capillary: 71 mg/dL (ref 70–99)
Glucose-Capillary: 92 mg/dL (ref 70–99)
Glucose-Capillary: 95 mg/dL (ref 70–99)

## 2014-04-01 LAB — CREATININE, SERUM
Creatinine, Ser: 0.69 mg/dL (ref 0.50–1.10)
GFR calc non Af Amer: 83 mL/min — ABNORMAL LOW (ref >90)

## 2014-04-01 LAB — PREPARE CRYOPRECIPITATE
UNIT DIVISION: 0
UNIT DIVISION: 0
Unit division: 0

## 2014-04-01 LAB — HEPATIC FUNCTION PANEL
ALT: 62 U/L — ABNORMAL HIGH (ref 0–35)
AST: 132 U/L — ABNORMAL HIGH (ref 0–37)
Albumin: 3.2 g/dL — ABNORMAL LOW (ref 3.5–5.2)
Alkaline Phosphatase: 45 U/L (ref 39–117)
Bilirubin, Direct: 0.2 mg/dL (ref 0.0–0.3)
Indirect Bilirubin: 0.7 mg/dL (ref 0.3–0.9)
Total Bilirubin: 0.9 mg/dL (ref 0.3–1.2)
Total Protein: 5.4 g/dL — ABNORMAL LOW (ref 6.0–8.3)

## 2014-04-01 LAB — PREPARE FRESH FROZEN PLASMA
Unit division: 0
Unit division: 0

## 2014-04-01 LAB — PREPARE PLATELET PHERESIS
Unit division: 0
Unit division: 0
Unit division: 0

## 2014-04-01 LAB — POCT I-STAT, CHEM 8
BUN: 16 mg/dL (ref 6–23)
Calcium, Ion: 1.03 mmol/L — ABNORMAL LOW (ref 1.13–1.30)
Chloride: 109 mEq/L (ref 96–112)
Creatinine, Ser: 0.6 mg/dL (ref 0.50–1.10)
Glucose, Bld: 115 mg/dL — ABNORMAL HIGH (ref 70–99)
HCT: 36 % (ref 36.0–46.0)
Hemoglobin: 12.2 g/dL (ref 12.0–15.0)
Potassium: 4.4 mmol/L (ref 3.5–5.1)
Sodium: 136 mmol/L (ref 135–145)
TCO2: 24 mmol/L (ref 0–100)

## 2014-04-01 LAB — BASIC METABOLIC PANEL
Anion gap: 9 (ref 5–15)
BUN: 10 mg/dL (ref 6–23)
CALCIUM: 8.1 mg/dL — AB (ref 8.4–10.5)
CHLORIDE: 108 meq/L (ref 96–112)
CO2: 28 mmol/L (ref 19–32)
CREATININE: 0.78 mg/dL (ref 0.50–1.10)
GFR, EST NON AFRICAN AMERICAN: 80 mL/min — AB (ref >90)
Glucose, Bld: 169 mg/dL — ABNORMAL HIGH (ref 70–99)
Potassium: 3.1 mmol/L — ABNORMAL LOW (ref 3.5–5.1)
SODIUM: 145 mmol/L (ref 135–145)

## 2014-04-01 LAB — MAGNESIUM
Magnesium: 2.7 mg/dL — ABNORMAL HIGH (ref 1.5–2.5)
Magnesium: 2.7 mg/dL — ABNORMAL HIGH (ref 1.5–2.5)

## 2014-04-01 MED ORDER — METOPROLOL TARTRATE 25 MG PO TABS
25.0000 mg | ORAL_TABLET | Freq: Two times a day (BID) | ORAL | Status: DC
  Administered 2014-04-01 – 2014-04-08 (×15): 25 mg via ORAL
  Filled 2014-04-01 (×16): qty 1

## 2014-04-01 MED ORDER — LABETALOL HCL 5 MG/ML IV SOLN
10.0000 mg | INTRAVENOUS | Status: DC | PRN
  Administered 2014-04-01 – 2014-04-02 (×3): 10 mg via INTRAVENOUS
  Filled 2014-04-01 (×3): qty 4

## 2014-04-01 MED ORDER — HYDROCODONE-ACETAMINOPHEN 5-325 MG PO TABS
1.0000 | ORAL_TABLET | ORAL | Status: DC | PRN
Start: 2014-04-01 — End: 2014-04-08

## 2014-04-01 MED ORDER — FUROSEMIDE 10 MG/ML IJ SOLN
20.0000 mg | Freq: Two times a day (BID) | INTRAMUSCULAR | Status: DC
  Administered 2014-04-01 – 2014-04-02 (×2): 20 mg via INTRAVENOUS
  Filled 2014-04-01 (×4): qty 2

## 2014-04-01 MED ORDER — POTASSIUM CHLORIDE 10 MEQ/50ML IV SOLN
10.0000 meq | INTRAVENOUS | Status: AC | PRN
  Administered 2014-04-01 (×3): 10 meq via INTRAVENOUS
  Filled 2014-04-01 (×2): qty 50

## 2014-04-01 MED ORDER — INSULIN ASPART 100 UNIT/ML ~~LOC~~ SOLN
0.0000 [IU] | SUBCUTANEOUS | Status: DC
  Administered 2014-04-01 – 2014-04-02 (×2): 2 [IU] via SUBCUTANEOUS

## 2014-04-01 MED ORDER — POTASSIUM CHLORIDE 10 MEQ/50ML IV SOLN
10.0000 meq | Freq: Once | INTRAVENOUS | Status: AC
  Administered 2014-04-01: 10 meq via INTRAVENOUS

## 2014-04-01 MED ORDER — HYDRALAZINE HCL 25 MG PO TABS
25.0000 mg | ORAL_TABLET | Freq: Three times a day (TID) | ORAL | Status: DC
  Administered 2014-04-01 – 2014-04-03 (×7): 25 mg via ORAL
  Filled 2014-04-01 (×10): qty 1

## 2014-04-01 MED ORDER — MILRINONE IN DEXTROSE 20 MG/100ML IV SOLN
0.2000 ug/kg/min | INTRAVENOUS | Status: DC
  Administered 2014-04-01: 0.2 ug/kg/min via INTRAVENOUS
  Filled 2014-04-01: qty 100

## 2014-04-01 MED ORDER — INSULIN GLARGINE 100 UNIT/ML ~~LOC~~ SOLN
12.0000 [IU] | Freq: Every day | SUBCUTANEOUS | Status: DC
  Administered 2014-04-01: 12 [IU] via SUBCUTANEOUS
  Filled 2014-04-01 (×2): qty 0.12

## 2014-04-01 MED ORDER — INSULIN DETEMIR 100 UNIT/ML ~~LOC~~ SOLN
10.0000 [IU] | Freq: Two times a day (BID) | SUBCUTANEOUS | Status: DC
  Filled 2014-04-01: qty 0.1

## 2014-04-01 MED FILL — Sodium Bicarbonate IV Soln 8.4%: INTRAVENOUS | Qty: 150 | Status: AC

## 2014-04-01 MED FILL — Potassium Chloride Inj 2 mEq/ML: INTRAVENOUS | Qty: 40 | Status: AC

## 2014-04-01 MED FILL — Lidocaine HCl IV Inj 20 MG/ML: INTRAVENOUS | Qty: 5 | Status: AC

## 2014-04-01 MED FILL — Albumin, Human Inj 5%: INTRAVENOUS | Qty: 250 | Status: AC

## 2014-04-01 MED FILL — Heparin Sodium (Porcine) Inj 1000 Unit/ML: INTRAMUSCULAR | Qty: 30 | Status: AC

## 2014-04-01 MED FILL — Sodium Chloride IV Soln 0.9%: INTRAVENOUS | Qty: 3000 | Status: AC

## 2014-04-01 MED FILL — Electrolyte-R (PH 7.4) Solution: INTRAVENOUS | Qty: 6000 | Status: AC

## 2014-04-01 MED FILL — Magnesium Sulfate Inj 50%: INTRAMUSCULAR | Qty: 10 | Status: AC

## 2014-04-01 MED FILL — Heparin Sodium (Porcine) Inj 1000 Unit/ML: INTRAMUSCULAR | Qty: 20 | Status: AC

## 2014-04-01 MED FILL — Mannitol IV Soln 20%: INTRAVENOUS | Qty: 500 | Status: AC

## 2014-04-01 MED FILL — Calcium Chloride Inj 10%: INTRAVENOUS | Qty: 10 | Status: AC

## 2014-04-01 NOTE — Progress Notes (Signed)
1 Day Post-Op Procedure(s) (LRB): BIO-BENTALL PROCEDURE using a 70mm Magna Ease Aortic Tissue Valve, a 26 mm Terumo Gelweave Valsalva Graft, a 14x10x30mm Terumo Gelweave Graft, and a 49mm straight Hemashield Platinum Graft. (N/A) TRANSESOPHAGEAL ECHOCARDIOGRAM (TEE) (N/A) Subjective: Extubated, neuto intact L hand warm A-paced 74     on nipride and NTG for BP control  Objective: Vital signs in last 24 hours: Temp:  [95.9 F (35.5 C)-98.6 F (37 C)] 96.6 F (35.9 C) (01/19 0745) Pulse Rate:  [60-88] 70 (01/19 0745) Cardiac Rhythm:  [-] Atrial paced (01/19 0600) Resp:  [8-26] 10 (01/19 0745) BP: (94-137)/(48-70) 109/48 mmHg (01/19 0700) SpO2:  [90 %-99 %] 95 % (01/19 0745) Arterial Line BP: (104-166)/(37-73) 151/46 mmHg (01/19 0745) FiO2 (%):  [40 %-50 %] 50 % (01/18 2200) Weight:  [121 lb 4.1 oz (55 kg)] 121 lb 4.1 oz (55 kg) (01/19 0500)  Hemodynamic parameters for last 24 hours: PAP: (19-39)/(4-20) 34/12 mmHg CO:  [3.5 L/min-5.9 L/min] 5.9 L/min CI:  [2.1 L/min/m2-4.1 L/min/m2] 4.1 L/min/m2  Intake/Output from previous day: 01/18 0701 - 01/19 0700 In: 6195.0 [I.V.:2860.2; DTOIZ:1245; NG/GT:30; IV Piggyback:900] Out: 8099 [Urine:3855; Chest Tube:500] Intake/Output this shift:    Lungs clear Pedal pulses 2+  Lab Results:  Recent Labs  03/31/14 1750 03/31/14 1753 04/01/14 0315  WBC 6.6  --  10.7*  HGB 11.9* 11.6* 11.8*  HCT 34.3* 34.0* 34.6*  PLT 148*  --  183   BMET:  Recent Labs  03/31/14 1557 03/31/14 1753 04/01/14 0315  NA 141 146* 145  K 3.4* 2.9* 3.1*  CL 101  --  108  CO2  --   --  28  GLUCOSE 102* 53* 169*  BUN 9  --  10  CREATININE 0.40*  --  0.78  CALCIUM  --   --  8.1*    PT/INR:  Recent Labs  03/31/14 1750  LABPROT 16.2*  INR 1.29   ABG    Component Value Date/Time   PHART 7.333* 04/01/2014 0003   HCO3 26.6* 04/01/2014 0003   TCO2 28 04/01/2014 0003   ACIDBASEDEF 1.0 03/31/2014 1601   O2SAT 96.0 04/01/2014 0003   CBG (last 3)    Recent Labs  03/31/14 2359 04/01/14 0056 04/01/14 0201  GLUCAP 181* 188* 171*    Assessment/Plan: S/P Procedure(s) (LRB): BIO-BENTALL PROCEDURE using a 41mm Magna Ease Aortic Tissue Valve, a 26 mm Terumo Gelweave Valsalva Graft, a 14x10x67mm Terumo Gelweave Graft, and a 47mm straight Hemashield Platinum Graft. (N/A) TRANSESOPHAGEAL ECHOCARDIOGRAM (TEE) (N/A) Mobilize Diabetes control See progression orders  BP control   LOS: 1 day    VAN TRIGT III,PETER 04/01/2014

## 2014-04-01 NOTE — Progress Notes (Signed)
Hypoglycemic Event  CBG: 54 at 1942  Treatment: 18 mL d50  Symptoms: none  Follow-up CBG: Time:2008 CBG Result:108  Possible Reasons for Event: insulin gtt     Yvonne Ballard  Remember to initiate Hypoglycemia Order Set & complete

## 2014-04-01 NOTE — Progress Notes (Signed)
CT surgery p.m. Rounds  Patient continues to do well but with some problems with transient confusion and persistent hypertension She remains on low-dose nitroprusside this evening-IV labetalol ordered and IV Lasix diuresis will start Liver panel and evening labs reviewed and are satisfactory, blood sugars 115 Chest tube drainage persistent-we'll leave mediastinal tubes

## 2014-04-01 NOTE — Op Note (Signed)
Yvonne Ballard, Yvonne Ballard NO.:  [id number]  MEDICAL RECORD NO.:  [id number]  LOCATION:  2S02C                        FACILITY:  [facility]  PHYSICIAN:  Yvonne Ballard, M.D.  DATE OF BIRTH:  27-Feb-1941  DATE OF PROCEDURE:  03/31/2014 DATE OF DISCHARGE:                              OPERATIVE REPORT   OPERATIONS: 1. Median sternotomy for aortic root replacement with a biologic -     Bentall procedure using a 23-mm pericardial valve [facility]     Ease, serial number [id number]) and a 26-mm Valsalva graft with re-implantation of the cornaries using     hypothermic circulatory arrest with antegrade cerebral perfusion. 2. Replacement of the ascending aorta and full length aortic arch     using a trifurcated Gelweave graft, 14 x 10 x 10. 3. Elephant trunk reconstruction of the distal arch using a 28-mm     Hemashield platinum woven graft. 4. Placement of femoral A-line for blood pressure monitoring.  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  SURGEON:  Yvonne Ballard, M.D.  ASSISTANT:  [name], P.A.-C.  ANESTHESIA:  General by Dr. Rica Ballard.  INDICATIONS:  The patient is a 74 year old Caucasian female with history of hypertension, who presents with recently diagnosed severe aortic insufficiency and a 7-8 cm ascending and arch aneurysm.  Cardiac catheterization demonstrated no significant coronary artery disease. She was recommended for urgent root replacement and replacement of her ascending aorta and aortic arch.  I examined the patient in the office and reviewed the results of her echo and subtotally her cardiac catheterization with the patient.  I  discussed the indications and expected benefits of aortic valve replacement with a tissue valve as well as placement of her aortic root with reimplantation of the coronary arteries and reimplantation of her ascending aorta and aortic arch with a Dacron graft.  I discussed the major details of surgery including the use of general anesthesia and cardiopulmonary bypass, the location of the surgical incisions including right axillary artery cannulation and expected postoperative recovery.  I discussed further the risks to her of the operation including the risks of bleeding, blood transfusion requirement, MI, stroke, postoperative pulmonary problems including pleural effusions, and death.  After reviewing these issues, she demonstrated her understanding and agreed to proceed with surgery under what I felt was an informed consent.  OPERATIVE FINDINGS: 1. Severe dilatation of the aortic root and entire descending aorta to     the distal arch. 2. Intraoperative coagulopathy with severe thrombocytopenia, replaced     with blood product transfusion. 3. Successful replacement of aortic root with normal-functioning     bioprosthetic valve by TEE at the termination of cardiopulmonary     bypass.  DESCRIPTION OF PROCEDURE:  The patient was brought to the operating room and placed supine on the operating table where general anesthesia was induced under invasive hemodynamic monitoring.  A transesophageal echo probe was placed by the anesthesiologist,  which documented the diagnosis of severe aortic insufficiency with a large ascending aneurysm.  A proper time-out was performed.  A femoral A-line was placed in the left femoral artery for blood pressure monitoring.  An incision was made beneath the right clavicle dividing the pectoralis major and pectoralis minor.  The axillary artery was carefully dissected from the brachial plexus and surrounded with the vessel loop.  Heparin 4000 units was  administered.  The axillary artery was clamped proximally and distally and an arteriotomy was made.  A combined graft-cannula was then sewn end-to-side to the axillary artery (8-mm graft) with running 5- 0 Prolene.  A light layer of Coseal was placed around the anastomosis and the retractor was removed and the clamps removed.  Next, the sternal incision was made.  The sternotomy was performed carefully due to the size of the aneurysm, which came up underneath the sternal edge.  The chest retractor was placed and the pericardium was opened and suspended.  The ascending aorta was a huge aneurysm, but without hematoma or evidence of dissection.  The innominate artery was dissected and encircled with a vessel loop.  Pursestring was placed in the right atrium and after full heparin was dosed and the ACT was documented as being therapeutic, the patient was cannulated with a two- stage venous cannula.  A left ventricular vent was placed via the right superior pulmonary vein and a pursestring for a coronary sinus cardioplegia catheter was placed in the right atrium as well.  The patient was placed on cardiopulmonary bypass.  The LV vent was used to decompress the ventricle from the severe AI.  The pulmonary artery was carefully dissected off the aorta.  The patient was then cooled towards 18 degrees core temperature.  A crossclamp using the large inserts were then placed beneath the innominate artery and cardioplegia was delivered through the retrograde coronary sinus catheter.  One lead was delivered.  There was good cardioplegic arrest and septal temperature dropped less than 14 degrees. Topical hypothermic slush was applied to the ventricle as well.  The aorta was opened longitudinally and then divided beneath the crossclamp distally.  More proximally, the aortic valve was identified and was very thin and had areas of fenestration.  The aortic valve leaflets were excised.  The coronary  buttons were developed and carefully retracted away with a stay suture of 3-0 silk.  The aortic annulus was sized to a 23 Magna Ease valve.  This was brought to the field, then prepared according to protocol.  Cardioplegia was redosed.  The Magna Ease valve was then placed in the Valsalva graft at the proximal end after the scar had been trimmed to remaining three rings of graft material.  Three 4-0 Prolene sutures were placed at each commissure to see she weighed the valve in the proximal end of the graft at the sewing ring.  Attention was directed to the aortic annulus and the supraannular 2-0 Ethibond pledgeted sutures were placed around the annulus and numbering 16 total.  The sutures were then placed through the sewing ring and the graft of the valve - conduit and the valve - conduit was seated and the sutures were tied.  A light layer of Coseal was placed around the annular anastomosis.  The coronary button to the left main was sewn to the posterior aspect of the graft after an appropriate-sized opening was made with the eye cautery and a running 5-0 Prolene suture.  A light layer of Coseal was placed around this  anastomosis, which was checked from inside and was widely patent.  Next, the right coronary button was sewn to the anterior aspect of the graft after an opening was placed in the appropriate position using the eye cautery, using running 5-0 Prolene.  The anastomosis was constructed and was widely patent from exam on the inside of the graft.  A light layer of Coseal was placed around this anastomosis.  Next, the patient was placed in deep Trendelenburg and circulatory arrest was initiated after intravenous etomidate and 125 mg of Solu- Medrol were administered by Anesthesia.  The head was packed in ice and the innominate artery was clamped, has antegrade cerebral perfusion 500 mL/minute was provided via the right axillary artery cannula.  The remainder of the  aneurysm was resected including the arch with after detaching the innominate artery as well as the left carotid artery from the arch.  The distal aneurysm was resected just before the left subclavian artery, which was very lateral and almost intrapleural in location.  The aorta, at this point, was not aneurysmal, so, the left subclavian artery was not replaced.  The trifurcated graft of Gelweave was then brought to the field and the 14 x 10 x 10 graft was used.  First, the anastomosis was constructed to the left carotid using a running 5-0 Prolene after the side-arm had been trimmed to the appropriate length.  The light layer of Coseal was placed around this anastomosis.  Next, the innominate artery which had been clamped proximally and allowed antegrade cerebral perfusion, was then sewn to the 14-mm end of the trifurcated graft using running 5-0 Prolene and this anastomosis was coated with a fine layer of Coseal.  Next, the clamp was removed off the innominate artery and placed on the graft beneath the side-arm to the carotid arteries until there was completing antegrade cerebral perfusion from this point on.  The trifurcated graft was gently moved superiorly to allow exposure of the distal arch.  It was decided to use an elephant trunk graft for the distal arch anastomosis due to the very friable nature of the aorta and difficulty doing an end-to-end anastomosis.  A 28-mm Hemashield graft was intussuscepted and then placed into the descending aorta.  A 3-0 Prolene whipstitch was then placed around the graft from inside the aorta out. This was then reinforced with several interrupted 4-0 pledgeted Prolene sutures around the entire circumference of the distal arch.  The intussuscepted portion of the Hemashield graft was then pulled back into the field.  This left a 5-cm cuff of graft going distally into the descending thoracic aorta, which remained.  A fine layer of Coseal  was placed around the distal arch anastomosis where the elephant trunk was created.  Next, the graft from the Valsalva graft was anastomosed to the elephant trunk straight Hemashield after the two grafts had been beveled and cut to the appropriate length.  A running 4-0 Prolene was used for this anastomosis.  This was coated with Coseal.  Next, the trifurcated graft was sewn end-to-side to the Valsalva graft using an eye cautery to make an appropriate opening and length of the anastomosis.  This was created with a running 4-0 Prolene.  The side-arm to the subclavian artery had been previously stapled off.  After this was created, a vent was placed in the new arch (Magoon needle) and clamp on the trifurcated graft was removed to allow reperfusion of the body.  The patient was kept cold for the first 5 minutes  of reperfusion and then gradually inflow temperature was brought back up to 38 degrees.  With release of the clamps, there was some bleeding spots in the graft- to-graft anastomosis, which was controlled with some figure-of-eight 5-0 Prolenes.  The Bentall anastomosis and coronary buttons appeared to be hemostatic.  The coronary sinus cardioplegia catheter was removed.  The patient was rewarmed and reperfused.  Temporary pacing wires were applied.  The patient was cardioverted back to a regular rhythm when one core temperature reached 25 degrees.  When the patient reached 36.5 degrees, she was weaned from cardiopulmonary bypass on low-dose inotropes.  Echo showed good functioning of the bioprosthetic valve with good LV function.  The vent - Magoon needle was removed from the arch graft and the pursestring tied.  The venous cannula was removed.  The patient received protamine. There was still significant bleeding.  The platelet count returned at 10,000 and platelets were administered.  The fibrinogen level returned at 125 and cryoprecipitate and FFP were administered.   After administration of the blood products, the hemostasis improved.  Anterior and posterior mediastinal chest tubes were placed and right pleural chest tube was placed and brought out through separate incisions.  The side graft to the axillary artery was stapled and this incision was closed in layers using Vicryl and skin staples for the skin.  The chest was closed with interrupted steel wire.  The pectoralis fascia was closed using running Vicryl.  The subcutaneous and skin layers were closed using running Vicryl.  Total cardiopulmonary bypass time was 300 minutes.  Total antegrade cerebral perfusion time was 120 minutes. Total circulatory arrest was 120 minutes.  The patient returned to the ICU in critical, but stable condition.     Yvonne Ballard, M.D.     PV/MEDQ  D:  03/31/2014  T:  04/01/2014  Job:  [id number]

## 2014-04-01 NOTE — Progress Notes (Signed)
Utilization Review Completed.Donne Anon T1/19/2016

## 2014-04-02 ENCOUNTER — Inpatient Hospital Stay (HOSPITAL_COMMUNITY): Payer: Medicare Other

## 2014-04-02 LAB — GLUCOSE, CAPILLARY
Glucose-Capillary: 111 mg/dL — ABNORMAL HIGH (ref 70–99)
Glucose-Capillary: 124 mg/dL — ABNORMAL HIGH (ref 70–99)
Glucose-Capillary: 76 mg/dL (ref 70–99)
Glucose-Capillary: 86 mg/dL (ref 70–99)
Glucose-Capillary: 87 mg/dL (ref 70–99)
Glucose-Capillary: 94 mg/dL (ref 70–99)

## 2014-04-02 LAB — TYPE AND SCREEN
ABO/RH(D): AB POS
Antibody Screen: NEGATIVE
Unit division: 0
Unit division: 0
Unit division: 0
Unit division: 0
Unit division: 0
Unit division: 0
Unit division: 0
Unit division: 0

## 2014-04-02 LAB — BASIC METABOLIC PANEL
Anion gap: 9 (ref 5–15)
BUN: 18 mg/dL (ref 6–23)
CO2: 27 mmol/L (ref 19–32)
Calcium: 8.3 mg/dL — ABNORMAL LOW (ref 8.4–10.5)
Chloride: 103 mEq/L (ref 96–112)
Creatinine, Ser: 0.74 mg/dL (ref 0.50–1.10)
GFR calc Af Amer: >90 mL/min (ref >90)
GFR calc non Af Amer: 81 mL/min — ABNORMAL LOW (ref >90)
Glucose, Bld: 90 mg/dL (ref 70–99)
Potassium: 4.1 mmol/L (ref 3.5–5.1)
Sodium: 139 mmol/L (ref 135–145)

## 2014-04-02 LAB — CBC
HCT: 36.5 % (ref 36.0–46.0)
Hemoglobin: 12.1 g/dL (ref 12.0–15.0)
MCH: 29.7 pg (ref 26.0–34.0)
MCHC: 33.2 g/dL (ref 30.0–36.0)
MCV: 89.7 fL (ref 78.0–100.0)
Platelets: 157 10*3/uL (ref 150–400)
RBC: 4.07 MIL/uL (ref 3.87–5.11)
RDW: 15 % (ref 11.5–15.5)
WBC: 14.7 10*3/uL — ABNORMAL HIGH (ref 4.0–10.5)

## 2014-04-02 MED ORDER — ADULT MULTIVITAMIN W/MINERALS CH
1.0000 | ORAL_TABLET | Freq: Every day | ORAL | Status: DC
  Administered 2014-04-02 – 2014-04-08 (×7): 1 via ORAL
  Filled 2014-04-02 (×7): qty 1

## 2014-04-02 MED ORDER — INSULIN GLARGINE 100 UNIT/ML ~~LOC~~ SOLN
10.0000 [IU] | Freq: Every day | SUBCUTANEOUS | Status: DC
  Administered 2014-04-02: 10 [IU] via SUBCUTANEOUS
  Filled 2014-04-02: qty 0.1

## 2014-04-02 MED ORDER — FUROSEMIDE 10 MG/ML IJ SOLN
40.0000 mg | Freq: Two times a day (BID) | INTRAMUSCULAR | Status: DC
  Administered 2014-04-02 – 2014-04-03 (×2): 40 mg via INTRAVENOUS
  Filled 2014-04-02 (×3): qty 4

## 2014-04-02 NOTE — Progress Notes (Signed)
Patient ID: Yvonne Ballard, female   DOB: 1940-07-14, 74 y.o.   MRN: [id number]  SICU Evening Rounds  Hemodynamically stable off milrinone.  A-paced 74  Urine output ok  Up in chair and comfortable.

## 2014-04-02 NOTE — Clinical Social Work Note (Signed)
CSW received consult for SNF recommendation.  Attempted to complete assessment, but patient was not able to converse or respond.  Attempted to call family, was not able to leave a message, will try again later in week.  Jones Broom. [place], MSW, [place] 04/02/2014 4:02 PM

## 2014-04-02 NOTE — Evaluation (Signed)
Physical Therapy Evaluation Patient Details Name: Yvonne Ballard MRN: [id number] DOB: 01/23/1941 Today's Date: 04/02/2014   History of Present Illness  74 y.o. female s/p Ascending TAA repair including Including Aorto-Brachiocephalic bypass, Aorto-Left common carotid bypass  Clinical Impression  Patient is seen following the above procedure and presents with functional limitations due to the deficits listed below (see PT Problem List). Slow and guarded gait with close min guard assist for safety up to 60 feet today while using a rolling walker lightly for stability. Pt complained of dizziness and became mildly anxious requiring her to sit. Reviewed sternal precautions and tolerated therapeutic exercises well. SpO2 97% on 3L supplemental O2, weaned down from 4L initially. Patient will benefit from skilled PT to increase their independence and safety with mobility to allow discharge to the venue listed below.       Follow Up Recommendations SNF (May progress to HHPT)    Equipment Recommendations  Other (comment) (Toilet riser (If we do not supply then needs 3 in 1))    Recommendations for Other Services OT consult     Precautions / Restrictions Precautions Precautions: Sternal;Fall Restrictions Weight Bearing Restrictions: Yes Other Position/Activity Restrictions: sternal      Mobility  Bed Mobility               General bed mobility comments: Pt in chair upon PT arrival  Transfers Overall transfer level: Needs assistance Equipment used: Rolling walker (2 wheeled) Transfers: Sit to/from Stand Sit to Stand: Min guard         General transfer comment: Close guard for safety. VC for technique and hand placement to maintain sternal precautions  Ambulation/Gait Ambulation/Gait assistance: Min guard;+2 safety/equipment Ambulation Distance (Feet): 60 Feet Assistive device: Rolling walker (2 wheeled) Gait Pattern/deviations: Step-through pattern;Decreased stride  length;Shuffle;Trunk flexed;Narrow base of support Gait velocity: decreased Gait velocity interpretation: Below normal speed for age/gender General Gait Details: Very slow and guarded gait. Educated on safe DME use with a rolling walker, to maintain sternal precautions with light touch on RW for balance, and VC for larger steps with upright posture. No loss of balance while holding RW for support. Pt reported dizziness and was mildly anxious to return to chair. (BP 141/69 HR 74 SpO2 97% on 3L supplemental O2)  Stairs            Wheelchair Mobility    Modified Rankin (Stroke Patients Only)       Balance Overall balance assessment: Needs assistance Sitting-balance support: No upper extremity supported;Feet supported Sitting balance-Leahy Scale: Good     Standing balance support: Bilateral upper extremity supported Standing balance-Leahy Scale: Poor                               Pertinent Vitals/Pain Pain Assessment: No/denies pain  Pre therapy: SpO2 93% on 4L supplemental O2 HR 68 BP 157/57  Post ambulation: COMPLAINS OF DIZZINESS SpO2 97% on 3L supplemental O2 HR 74 BP 141/69    Home Living Family/patient expects to be discharged to:: Private residence Living Arrangements: Children Available Help at Discharge: Family;Available 24 hours/day (For first week only - then intermittent care) Type of Home: House Home Access: Stairs to enter Entrance Stairs-Rails: Left Entrance Stairs-Number of Steps: 3 Home Layout: Two level Home Equipment: Walker - 4 wheels;Shower seat      Prior Function Level of Independence: Independent               Hand Dominance  Dominant Hand: Right    Extremity/Trunk Assessment   Upper Extremity Assessment: Defer to OT evaluation           Lower Extremity Assessment: Generalized weakness         Communication   Communication: No difficulties  Cognition Arousal/Alertness: Awake/alert Behavior During  Therapy: WFL for tasks assessed/performed Overall Cognitive Status: Within Functional Limits for tasks assessed                      General Comments General comments (skin integrity, edema, etc.): Reported dizziness, VSS, tolerated wean of O2 to 3L supplemental O2.    Exercises General Exercises - Lower Extremity Ankle Circles/Pumps: AROM;Both;10 reps;Seated Quad Sets: Strengthening;Both;10 reps;Seated      Assessment/Plan    PT Assessment Patient needs continued PT services  PT Diagnosis Difficulty walking;Abnormality of gait;Generalized weakness   PT Problem List Decreased strength;Decreased range of motion;Decreased activity tolerance;Decreased balance;Decreased mobility;Decreased knowledge of use of DME;Decreased knowledge of precautions;Cardiopulmonary status limiting activity  PT Treatment Interventions Gait training;DME instruction;Stair training;Functional mobility training;Therapeutic activities;Therapeutic exercise;Balance training;Neuromuscular re-education;Patient/family education;Modalities   PT Goals (Current goals can be found in the Care Plan section) Acute Rehab PT Goals Patient Stated Goal: Go home PT Goal Formulation: With patient Time For Goal Achievement: 04/16/14 Potential to Achieve Goals: Good    Frequency Min 3X/week   Barriers to discharge Decreased caregiver support (Will only have 1 week of 24hr care starting 25th Jan.) Will only have 1 week of 24hr care starting 25th Jan when daughter comes to visit. Other daughter than lives with Pt works variable schedule.    Co-evaluation               End of Session Equipment Utilized During Treatment: Gait belt;Oxygen Activity Tolerance: Other (comment) (Limited by reported dizziness) Patient left: in chair;with call bell/phone within reach Nurse Communication: Mobility status;Other (comment) (dizziness, and wean of O2 to 3L)         Time: 0932-1000 PT Time Calculation (min) (ACUTE ONLY):  28 min   Charges:   PT Evaluation $Initial PT Evaluation Tier I: 1 Procedure PT Treatments $Gait Training: 8-22 mins   PT G Codes:        Ellouise Newer 04/02/2014, 10:32 AM  Elayne Snare, [place]

## 2014-04-02 NOTE — Progress Notes (Signed)
INITIAL NUTRITION ASSESSMENT  DOCUMENTATION CODES Per approved criteria  -Not Applicable   INTERVENTION: Provide Multivitamin with minerals daily Provide snack once daily Encourage PO intake  NUTRITION DIAGNOSIS: Inadequate oral intake related to poor appetite as evidenced by <25% meal completion.   Goal: Pt to meet >/= 90% of their estimated nutrition needs   Monitor:  PO intake, weight trend, labs  Reason for Assessment: Malnutrition Screening Tool  74 y.o. 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  S/p Bio-Bentall/ Ascending and Arch replacement  Pt states that she has no appetite today and was only able to eat a few bites of fruit at breakfast. She states that the only thing she ate yesterday was half a sandwich. Pt reports having a good appetite and eating well PTA. She usually weighs 107 lbs and has been maintaining her weight. RD encouraged adequate PO intake, offered nutritional supplements, and offered snacks. Pt declines nutritional supplements at this time but, agreed to receiving an evening snack.   Labs reviewed.   Nutrition Focused Physical Exam:  Subcutaneous Fat:  Orbital Region: wnl Upper Arm Region: wnl Thoracic and Lumbar Region: NA  Muscle:  Temple Region: wnl Clavicle Bone Region: wnl Clavicle and Acromion Bone Region: wnl Scapular Bone Region: NA Dorsal Hand: mild wasting Patellar Region: wnl Anterior Thigh Region: mild wasting Posterior Calf Region: wnl  Edema: none noted   Height: Ht Readings from Last 1 Encounters:  03/31/14 5\' 1"  (1.549 m)    Weight: Wt Readings from Last 1 Encounters:  04/02/14 122 lb 5.7 oz (55.5 kg)    Ideal Body Weight: 105 lbs  % Ideal Body Weight:  116%  Wt Readings from Last 10 Encounters:  04/02/14 122 lb 5.7 oz (55.5 kg)  03/26/14 105 lb (47.628 kg)  03/25/14 105 lb (47.628 kg)  03/18/14 106 lb (48.081 kg)    Usual Body Weight: 107 lbs  % Usual Body Weight: 114%  BMI:  Body mass index is 23.13 kg/(m^2).  Estimated Nutritional Needs: Kcal: 1200-1400 Protein: 60-70 grams Fluid: 1.4 L/day  Skin: closed incision on chest  Diet Order: Diet heart healthy/carb modified  EDUCATION NEEDS: -No education needs identified at this time   Intake/Output Summary (Last 24 hours) at 04/02/14 1103 Last data filed at 04/02/14 1030  Gross per 24 hour  Intake 1024.65 ml  Output   1575 ml  Net -550.35 ml    Last BM: PTA   Labs:   Recent Labs Lab 03/28/14 1128  04/01/14 0315 04/01/14 1557 04/01/14 1559 04/02/14 0400  NA 137  < > 145 136  --  139  K 3.7  < > 3.1* 4.4  --  4.1  CL 104  < > 108 109  --  103  CO2 26  --  28  --   --  27  BUN 12  < > 10 16  --  18  CREATININE 0.65  < > 0.78 0.60 0.69 0.74  CALCIUM 8.7  --  8.1*  --   --  8.3*  MG  --   --  2.7*  --  2.7*  --   GLUCOSE 110*  < > 169* 115*  --  90  < > = values in this interval not displayed.  CBG (last 3)  Recent Labs  04/01/14 2348 04/02/14 0355 04/02/14 0733  GLUCAP 124* 76 86    Scheduled Meds: . sodium chloride   Intravenous Once  . acetaminophen  1,000 mg Oral 4 times per day   Or  . acetaminophen (TYLENOL) oral liquid 160 mg/5 mL  1,000 mg Per Tube 4 times per day  . antiseptic oral rinse  7 mL Mouth Rinse BID  . aspirin EC  325 mg Oral Daily   Or  . aspirin  324 mg Per Tube Daily  . bisacodyl  10 mg Oral Daily   Or  . bisacodyl  10 mg Rectal Daily  . docusate sodium  200 mg Oral Daily  . furosemide  20 mg Intravenous Q12H  . hydrALAZINE  25 mg Oral 3 times per day  . insulin aspart  0-24 Units Subcutaneous 6 times per day  . insulin glargine  10 Units Subcutaneous Daily  . insulin regular  0-10 Units Intravenous TID WC  .  metoprolol tartrate  25 mg Oral BID  . pantoprazole  40 mg Oral Daily  . sodium chloride  3 mL Intravenous Q12H    Continuous Infusions: . sodium chloride 20 mL/hr at 04/01/14 0400  . sodium chloride    . sodium chloride 20 mL/hr at 04/01/14 0400  . insulin (NOVOLIN-R) infusion 3.8 Units/hr (04/01/14 0600)  . lactated ringers 10 mL/hr at 04/02/14 1000  . nitroGLYCERIN Stopped (04/01/14 1600)  . phenylephrine (NEO-SYNEPHRINE) Adult infusion      Past Medical History  Diagnosis Date  . Hypertension   . Abnormal ECG   . Anxiety disorder   . Weight loss   . Chest pain   . Heart murmur   . Anxiety     pt. reports that she is nervous person  . Headache     use to have a lot headaches, relative to her work, but pt. is retired now.      Past Surgical History  Procedure Laterality Date  . Left and right heart catheterization with coronary angiogram N/A 03/26/2014    Procedure: LEFT AND RIGHT HEART CATHETERIZATION WITH CORONARY ANGIOGRAM;  Surgeon: Peter M Martinique, MD;  Location[facility] CATH LAB;  Service: Cardiovascular;  Laterality: N/A;  . Tonsillectomy    . Tubal ligation    . Bentall procedure N/A 03/31/2014    Procedure: BIO-BENTALL PROCEDURE using a 51mm Magna Ease Aortic Tissue Valve, a 26 mm Terumo Gelweave Valsalva Graft, a 14x10x23mm Terumo Gelweave Graft, and a 87mm straight Hemashield Platinum Graft.;  Surgeon: Ivin Poot, MD;  Location: [place];  Service: Open Heart Surgery;  Laterality: N/A;  . Tee without cardioversion N/A 03/31/2014    Procedure: TRANSESOPHAGEAL ECHOCARDIOGRAM (TEE);  Surgeon: Ivin Poot, MD;  Location: [employer];  Service: Open Heart Surgery;  Laterality: N/A;    Pryor Ochoa RD, LDN Inpatient Clinical Dietitian Pager: [phone] After Hours Pager: [phone]

## 2014-04-02 NOTE — Progress Notes (Signed)
2 Days Post-Op Procedure(s) (LRB): BIO-BENTALL PROCEDURE using a 62mm Magna Ease Aortic Tissue Valve, a 26 mm Terumo Gelweave Valsalva Graft, a 14x10x73mm Terumo Gelweave Graft, and a 8mm straight Hemashield Platinum Graft. (N/A) TRANSESOPHAGEAL ECHOCARDIOGRAM (TEE) (N/A) Subjective: Patient doing well 2 days after a biologic Bentall and arch replacement for large ascending aneurysm from the aortic root to the distal arch Out of bed to chair, neuro intact Mild edema, receiving Lasix Maintaining normal sinus rhythm Blood pressure  Under control with oral agents and IV nitroglycerin and nitride have been discontinued Weaning milrinone off today Significant drainage from the anterior mediastinal tube /drain- will leave in place  Objective: Vital signs in last 24 hours: Temp:  [97.7 F (36.5 C)-98.3 F (36.8 C)] 98.3 F (36.8 C) (01/20 1613) Pulse Rate:  [64-140] 73 (01/20 1700) Cardiac Rhythm:  [-] Atrial paced (01/20 1700) Resp:  [9-26] 12 (01/20 1700) BP: (117-180)/(47-86) 155/60 mmHg (01/20 1700) SpO2:  [86 %-100 %] 96 % (01/20 1700) Weight:  [122 lb 5.7 oz (55.5 kg)] 122 lb 5.7 oz (55.5 kg) (01/20 0500)  Hemodynamic parameters for last 24 hours:   stable Intake/Output from previous day: 01/19 0701 - 01/20 0700 In: 1576.6 [P.O.:540; I.V.:586.6; IV Piggyback:450] Out: 1750 [Urine:950; Emesis/NG output:200; Chest Tube:600] Intake/Output this shift: Total I/O In: 432.5 [P.O.:240; I.V.:142.5; IV Piggyback:50] Out: 475 [Urine:305; Chest Tube:170]  Alert and oriented Lungs clear Heart rate regular No murmur Extremities warm Abdomen soft nontender  Lab Results:  Recent Labs  04/01/14 1559 04/02/14 0400  WBC 13.5* 14.7*  HGB 11.8* 12.1  HCT 34.6* 36.5  PLT 169 157   BMET:  Recent Labs  04/01/14 0315 04/01/14 1557 04/01/14 1559 04/02/14 0400  NA 145 136  --  139  K 3.1* 4.4  --  4.1  CL 108 109  --  103  CO2 28  --   --  27  GLUCOSE 169* 115*  --  90  BUN 10  16  --  18  CREATININE 0.78 0.60 0.69 0.74  CALCIUM 8.1*  --   --  8.3*    PT/INR:  Recent Labs  03/31/14 1750  LABPROT 16.2*  INR 1.29   ABG    Component Value Date/Time   PHART 7.333* 04/01/2014 0003   HCO3 26.6* 04/01/2014 0003   TCO2 24 04/01/2014 1557   ACIDBASEDEF 1.0 03/31/2014 1601   O2SAT 96.0 04/01/2014 0003   CBG (last 3)   Recent Labs  04/02/14 0733 04/02/14 1143 04/02/14 1611  GLUCAP 86 111* 87    Assessment/Plan: S/P Procedure(s) (LRB): BIO-BENTALL PROCEDURE using a 45mm Magna Ease Aortic Tissue Valve, a 26 mm Terumo Gelweave Valsalva Graft, a 14x10x59mm Terumo Gelweave Graft, and a 58mm straight Hemashield Platinum Graft. (N/A) TRANSESOPHAGEAL ECHOCARDIOGRAM (TEE) (N/A) See progression orders -remove posterior mediastinal tube   LOS: 2 days    VAN TRIGT III,PETER 04/02/2014

## 2014-04-03 ENCOUNTER — Inpatient Hospital Stay (HOSPITAL_COMMUNITY): Payer: Medicare Other

## 2014-04-03 LAB — COMPREHENSIVE METABOLIC PANEL
ALT: 39 U/L — ABNORMAL HIGH (ref 0–35)
AST: 45 U/L — ABNORMAL HIGH (ref 0–37)
Albumin: 2.7 g/dL — ABNORMAL LOW (ref 3.5–5.2)
Alkaline Phosphatase: 60 U/L (ref 39–117)
Anion gap: 9 (ref 5–15)
BUN: 27 mg/dL — ABNORMAL HIGH (ref 6–23)
CO2: 30 mmol/L (ref 19–32)
Calcium: 7.9 mg/dL — ABNORMAL LOW (ref 8.4–10.5)
Chloride: 99 mEq/L (ref 96–112)
Creatinine, Ser: 0.79 mg/dL (ref 0.50–1.10)
GFR calc Af Amer: >90 mL/min (ref >90)
GFR calc non Af Amer: 79 mL/min — ABNORMAL LOW (ref >90)
Glucose, Bld: 74 mg/dL (ref 70–99)
Potassium: 3.2 mmol/L — ABNORMAL LOW (ref 3.5–5.1)
Sodium: 138 mmol/L (ref 135–145)
Total Bilirubin: 0.8 mg/dL (ref 0.3–1.2)
Total Protein: 4.7 g/dL — ABNORMAL LOW (ref 6.0–8.3)

## 2014-04-03 LAB — CBC
HCT: 36.4 % (ref 36.0–46.0)
Hemoglobin: 12 g/dL (ref 12.0–15.0)
MCH: 30.5 pg (ref 26.0–34.0)
MCHC: 33 g/dL (ref 30.0–36.0)
MCV: 92.6 fL (ref 78.0–100.0)
Platelets: 134 10*3/uL — ABNORMAL LOW (ref 150–400)
RBC: 3.93 MIL/uL (ref 3.87–5.11)
RDW: 14.9 % (ref 11.5–15.5)
WBC: 11.2 10*3/uL — ABNORMAL HIGH (ref 4.0–10.5)

## 2014-04-03 LAB — GLUCOSE, CAPILLARY
Glucose-Capillary: 106 mg/dL — ABNORMAL HIGH (ref 70–99)
Glucose-Capillary: 131 mg/dL — ABNORMAL HIGH (ref 70–99)
Glucose-Capillary: 66 mg/dL — ABNORMAL LOW (ref 70–99)
Glucose-Capillary: 73 mg/dL (ref 70–99)
Glucose-Capillary: 77 mg/dL (ref 70–99)

## 2014-04-03 MED ORDER — SODIUM CHLORIDE 0.9 % IJ SOLN
3.0000 mL | Freq: Two times a day (BID) | INTRAMUSCULAR | Status: DC
  Administered 2014-04-03 – 2014-04-07 (×7): 3 mL via INTRAVENOUS

## 2014-04-03 MED ORDER — FUROSEMIDE 40 MG PO TABS
40.0000 mg | ORAL_TABLET | Freq: Every day | ORAL | Status: DC
  Administered 2014-04-03 – 2014-04-08 (×6): 40 mg via ORAL
  Filled 2014-04-03 (×6): qty 1

## 2014-04-03 MED ORDER — MOVING RIGHT ALONG BOOK
Freq: Once | Status: AC
  Administered 2014-04-03: 18:00:00
  Filled 2014-04-03: qty 1

## 2014-04-03 MED ORDER — HYDRALAZINE HCL 25 MG PO TABS
25.0000 mg | ORAL_TABLET | Freq: Four times a day (QID) | ORAL | Status: DC
  Administered 2014-04-03 – 2014-04-06 (×12): 25 mg via ORAL
  Filled 2014-04-03 (×16): qty 1

## 2014-04-03 MED ORDER — POTASSIUM CHLORIDE CRYS ER 20 MEQ PO TBCR
40.0000 meq | EXTENDED_RELEASE_TABLET | Freq: Once | ORAL | Status: AC
  Administered 2014-04-03: 40 meq via ORAL
  Filled 2014-04-03: qty 2

## 2014-04-03 MED ORDER — INSULIN ASPART 100 UNIT/ML ~~LOC~~ SOLN
0.0000 [IU] | Freq: Three times a day (TID) | SUBCUTANEOUS | Status: DC
Start: 2014-04-03 — End: 2014-04-03

## 2014-04-03 MED ORDER — POTASSIUM CHLORIDE 10 MEQ/50ML IV SOLN
10.0000 meq | INTRAVENOUS | Status: DC
  Administered 2014-04-03 (×2): 10 meq via INTRAVENOUS
  Filled 2014-04-03 (×2): qty 50

## 2014-04-03 MED ORDER — POTASSIUM CHLORIDE CRYS ER 20 MEQ PO TBCR
40.0000 meq | EXTENDED_RELEASE_TABLET | Freq: Every day | ORAL | Status: DC
  Administered 2014-04-03 – 2014-04-08 (×6): 40 meq via ORAL
  Filled 2014-04-03 (×6): qty 2

## 2014-04-03 MED ORDER — SODIUM CHLORIDE 0.9 % IV SOLN: 250.0000 mL | INTRAVENOUS | Status: DC | PRN

## 2014-04-03 MED ORDER — SODIUM CHLORIDE 0.9 % IJ SOLN: 3.0000 mL | INTRAMUSCULAR | Status: DC | PRN

## 2014-04-03 NOTE — Progress Notes (Addendum)
      [address]       [place] [address]             [phone]        3 Days Post-Op Procedure(s) (LRB): BIO-BENTALL PROCEDURE using a 19mm Magna Ease Aortic Tissue Valve, a 26 mm Terumo Gelweave Valsalva Graft, a 14x10x12mm Terumo Gelweave Graft, and a 23mm straight Hemashield Platinum Graft. (N/A) TRANSESOPHAGEAL ECHOCARDIOGRAM (TEE) (N/A)  Subjective: Patient awake, alert, and oriented.  Objective: Vital signs in last 24 hours: Temp:  [97.9 F (36.6 C)-98.3 F (36.8 C)] 98.3 F (36.8 C) (01/21 0732) Pulse Rate:  [71-82] 79 (01/21 0700) Cardiac Rhythm:  [-] Atrial paced (01/21 0400) Resp:  [9-25] 10 (01/21 0700) BP: (124-180)/(52-90) 163/77 mmHg (01/21 0700) SpO2:  [90 %-100 %] 96 % (01/21 0700) Weight:  [121 lb 14.6 oz (55.3 kg)] 121 lb 14.6 oz (55.3 kg) (01/21 0500)  Pre op weight 48 kg Current Weight  04/03/14 121 lb 14.6 oz (55.3 kg)      Intake/Output from previous day: 01/20 0701 - 01/21 0700 In: 802.5 [P.O.:480; I.V.:272.5; IV Piggyback:50] Out: 1105 [Urine:805; Chest Tube:300]   Physical Exam:  Cardiovascular: IRRR Pulmonary: Diminished at bases; no rales, wheezes, or rhonchi. Abdomen: Soft, non tender, bowel sounds present. Extremities: Mild bilateral lower extremity edema. Wounds: Clean and dry.  No erythema or signs of infection.  Lab Results: CBC: Recent Labs  04/02/14 0400 04/03/14 0500  WBC 14.7* 11.2*  HGB 12.1 12.0  HCT 36.5 36.4  PLT 157 134*   BMET:  Recent Labs  04/02/14 0400 04/03/14 0500  NA 139 138  K 4.1 3.2*  CL 103 99  CO2 27 30  GLUCOSE 90 74  BUN 18 27*  CREATININE 0.74 0.79  CALCIUM 8.3* 7.9*    PT/INR:  Lab Results  Component Value Date   INR 1.29 03/31/2014   INR 1.02 03/28/2014   INR 1.0 03/25/2014   ABG:  INR: Will add last result for INR, ABG once components are confirmed Will add last 4 CBG results once components are confirmed  Assessment/Plan:  1. CV - A Paced. On  Lopressor 25 bid, Hydralazine 25 tid.Hypertensive yesterday. BP 122/56 this am but given IV Hydralazine. Will increase oral Hydralazine 2.  Pulmonary - On 4 liters of oxygen via [place]. Wean as tolerates. Chest tube with 300 cc of drainage last 24 hours. CXR appears to show patient rotated to the right, no pneumothorax, bibasilar atelectasis with pleural effusions. Hope to remove today.Encourage incentive spirometer 3. Volume Overload - On Lasix 40 IV bid 4.  Acute blood loss anemia - H and H stable at 12 and 36.4 5. Mild thrombocytopenia-platelets 134,000 6. Supplement potassium 7. CBGs 94/77/73. Pre op HGA1C 5.6. Stop accu checks upon transfer 8. Hope to transfer to [place] [facility] 04/03/2014,7:43 AM  Leave MT Change lasix to po  tx to stepdown  patient examined and medical record reviewed,agree with above note. VAN TRIGT III,Kathan Kirker 04/03/2014

## 2014-04-03 NOTE — Op Note (Signed)
Yvonne, DEGNER NO.:  [id number]  MEDICAL RECORD NO.:  [id number]  LOCATION:  2S02C                        FACILITY:  [facility]  PHYSICIAN:  Ala Dach, M.D.DATE OF BIRTH:  06-04-1940  DATE OF PROCEDURE:  03/31/2014 DATE OF DISCHARGE:                              OPERATIVE REPORT   INDICATIONS FOR PROCEDURE:  [name] is a 74 year old female, patient of Dr. Tharon Aquas Tright who presents today for repair and replacement of a large ascending aortic aneurysm.  This is also associated with significant aortic insufficiency such that she is scheduled to have aortic root, aortic valve, and ascending aortic aneurysm replaced.  She is brought to the holding area on the morning of surgery where under local anesthesia and sedation, pulmonary artery and radial arterial lines were placed.  She was then taken to the OR for routine induction of general anesthesia after which the TEE probe was prepared, then passed oropharyngeally into the stomach, then slightly withdrawn for imaging of the cardiac structures.  PRE-CARDIOPULMONARY BYPASS TEE EXAMINATION:  Left Ventricle:  The left ventricular chamber is seen initially in the short axis view.  It is a mildly to moderately dilated left ventricular chamber seen in the short axis view.  Estimated ejection fraction is in the area of 45-50%. Papillary muscles are well outlined.  No other masses are appreciated. Several views are obtained.  Aortic Valve:  On further pullback of the TEE probe into the short axis view for the aortic valve, we are immediately struck by a significant enlarged annular dilatation of the aortic valve at this level.  The aortic valve itself was trileaflet.  The leaflets themselves are thin, compliant, and mobile.  However, during diastole, the central portion of the leaflets themselves are unable to collapse leading to a fairly large defect and this is then associated with a significant enlarged  aortic regurgitant jet.  This jet measures on the long-axis view approximately 75% of the total LVOT when looked at in the long axis view.  This is then considered significant and severe annular dilatation associated with severe aortic regurgitation.  On further pullback into the ascending aorta, we see a very large thin-walled ascending aortic aneurysm.  Three to four centimeters beyond the level of the aortic valve, this measured approximately 6.7 cm in diameter and TEE.  We followed this dilated aneurysm all the way up to the arch where we are unable to view any further.  On rotation to the posterior descending, we also see a mildly dilated descending aortic diameter of about 3 cm. Color Doppler again shows a holodiastolic regurgitant flow in the descending aorta.  This is consistent with severe aortic regurgitation.  Left Atrium:  Left atrial chamber is difficult to visualize due to the large size of the aortic valve.  Mitral Valve:  The mitral valve seen initially in the four-chamber view. There are mildly thickened leaflets noted.  Overall excursion opening appears normal.  On color Doppler, there is only central mild regurgitant flow appreciated.  Right Atrium:  The right atrial chamber itself is interrogated.  We see within it a pulmonary artery catheter.  Also, we see a small filamentous structure of about  2-3 cm in length which is consistent with a Chiari malformation.  Tricuspid Valve:  This is a thin compliant mobile tricuspid valve apparatus.  Right Ventricle: Right ventricular chamber is seen in multiple views. It is normally contractile normal size chamber.  The patient is placed on cardiopulmonary bypass.  Ultimately deep hypothermic arrest is obtained.  The repair consists of a Bentall type repair with replacement of the aortic valve, reimplantation of the coronary arteries, and an ascending aortic graft.  This was followed by additional trunk repairs as  described in the surgeons note of the subclavian and innominate areas.  The patient is rewarmed and separated from cardiopulmonary bypass with the initial attempt.  POST BYPASS PERIOD:  Left  Ventricle: The left ventricular size chamber is seen initially in the short axis view.  Compared to bypass, it is a mildly depressed left ventricular chamber in the short axis and long- axis view.  Contractility, however, is appreciated in all segmental wall areas.  The septal area is somewhat flattened as compared to the prebypass.  We then noted septal bounce.  The inferior wall itself was slightly hypokinetic in comparison to the prebypass with time and separation of cardiopulmonary bypass, these did slightly improve.  The anterior and anterolateral walls remained contractile and vigorous.  Aortic valve:  In the place of the aortic position, now we could see the new aortic valve structures as well the aortic conduit.  Multiple images are obtained of both valves in both short and long axis views.  The valve is seated well, opening well.  It poses no obstruction to flow during systolic ejection.  On color Doppler during diastole as well, there are no shunts, jets, or aortic insufficiency appreciated.  As noted, this is well seated, normally functioning aortic valve and conduit.  The rest of the cardiac examination was as previously described without any significant changes.  The patient was ultimately returned to the cardiac intensive care unit in stable condition.          ______________________________ Ala Dach, M.D.     JTM/MEDQ  D:  04/02/2014  T:  04/02/2014  Job:  [id number]

## 2014-04-03 NOTE — Progress Notes (Signed)
Hypoglycemic Event  CBG: 0728 66  Treatment: 8oz Apple Juice  Symptoms: none  Follow-up CBG: Time: 0758 CBG Result:106  Possible Reasons for Event:   Comments/MD notified:    Raquel James  Remember to initiate Hypoglycemia Order Set & complete

## 2014-04-03 NOTE — Progress Notes (Signed)
04/03/2014 5:58 PM Nursing note Moving right along book and content reviewed with patient. Questions encouraged. Educations also provided to patient in regards to correct use of IS. Pt. Performed return demonstration of instructions. Will continue to monitor patient.  Donavyn Fecher, Arville Lime

## 2014-04-03 NOTE — Progress Notes (Signed)
CARDIAC REHAB PHASE I   PRE:  Rate/Rhythm: a paced 80  BP:  Supine: 143/56  Sitting:  Standing:    SaO2: 2.5 L 94-100%  MODE:  Ambulation: 80 ft   POST:  Rate/Rhythm: 80 paced  BP:  Supine:   Sitting: 143/50  Standing:    SaO2: 93% 2L 1405-1440 Pt sleepy, did not want to walk. Encouraged pt to walk with [place]. Walked 80 ft on 2L with rolling walker and asst x 2. Leaned over as she tired. Requested to go back to bed. Pt did not want to go to recliner. To bed on 2.5L. Will keep as asst x 2 until some equipment d/ced. Deconditioned.   Graylon Good, RN BSN  04/03/2014 2:37 PM

## 2014-04-04 ENCOUNTER — Inpatient Hospital Stay (HOSPITAL_COMMUNITY): Payer: Medicare Other

## 2014-04-04 LAB — BASIC METABOLIC PANEL
Anion gap: 8 (ref 5–15)
BUN: 25 mg/dL — ABNORMAL HIGH (ref 6–23)
CO2: 30 mmol/L (ref 19–32)
Calcium: 8 mg/dL — ABNORMAL LOW (ref 8.4–10.5)
Chloride: 101 mEq/L (ref 96–112)
Creatinine, Ser: 0.82 mg/dL (ref 0.50–1.10)
GFR calc Af Amer: 79 mL/min — ABNORMAL LOW (ref >90)
GFR calc non Af Amer: 68 mL/min — ABNORMAL LOW (ref >90)
Glucose, Bld: 112 mg/dL — ABNORMAL HIGH (ref 70–99)
Potassium: 4.2 mmol/L (ref 3.5–5.1)
Sodium: 139 mmol/L (ref 135–145)

## 2014-04-04 LAB — CBC
HCT: 38.4 % (ref 36.0–46.0)
Hemoglobin: 12.6 g/dL (ref 12.0–15.0)
MCH: 30 pg (ref 26.0–34.0)
MCHC: 32.8 g/dL (ref 30.0–36.0)
MCV: 91.4 fL (ref 78.0–100.0)
Platelets: 145 10*3/uL — ABNORMAL LOW (ref 150–400)
RBC: 4.2 MIL/uL (ref 3.87–5.11)
RDW: 14.9 % (ref 11.5–15.5)
WBC: 10.3 10*3/uL (ref 4.0–10.5)

## 2014-04-04 LAB — CLOSTRIDIUM DIFFICILE BY PCR: Toxigenic C. Difficile by PCR: POSITIVE — AB

## 2014-04-04 MED ORDER — METRONIDAZOLE 500 MG PO TABS
500.0000 mg | ORAL_TABLET | Freq: Three times a day (TID) | ORAL | Status: DC
  Administered 2014-04-04 – 2014-04-08 (×13): 500 mg via ORAL
  Filled 2014-04-04 (×15): qty 1

## 2014-04-04 NOTE — Progress Notes (Signed)
Physical Therapy Treatment Patient Details Name: Yvonne Ballard MRN: [id number] DOB: 02-16-1941 Today's Date: 2014-04-25    History of Present Illness 74 y.o. 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  Follow Up Recommendations  SNF (May progress to HHPT)     Equipment Recommendations  Other (comment) (Toilet riser (If unable to obtain, then needs 3 in 1))    Recommendations for Other Services       Precautions / Restrictions Precautions Precautions: Sternal;Fall Restrictions Weight Bearing Restrictions: No    Mobility  Bed Mobility Overal bed mobility: Needs Assistance Bed Mobility: Supine to Sit;Sit to Supine     Supine to sit: Min guard Sit to supine: Min guard   General bed mobility comments: Verbal cues for technique.  Assist for safety only.  Transfers Overall transfer level: Needs assistance Equipment used: Rolling walker (2 wheeled) Transfers: Sit to/from Stand Sit to Stand: Min guard         General transfer comment: Close guard for safety. VC for technique and hand placement to maintain sternal precautions  Ambulation/Gait Ambulation/Gait assistance: Min guard Ambulation Distance (Feet): 160 Feet Assistive device: Rolling walker (2 wheeled) Gait Pattern/deviations: Step-through pattern;Decreased stride length Gait velocity: decreased Gait velocity interpretation: Below normal speed for age/gender General Gait Details: Patient with increased speed and improved balance since last session.  Improved endurance.   Stairs            Wheelchair Mobility    Modified Rankin (Stroke Patients Only)       Balance                                    Cognition Arousal/Alertness: Awake/alert Behavior During Therapy: WFL for tasks assessed/performed Overall Cognitive Status: Within  Functional Limits for tasks assessed                      Exercises      General Comments        Pertinent Vitals/Pain Pain Assessment: No/denies pain    Home Living                      Prior Function            PT Goals (current goals can now be found in the care plan section) Progress towards PT goals: Progressing toward goals    Frequency  Min 3X/week    PT Plan Current plan remains appropriate    Co-evaluation             End of Session Equipment Utilized During Treatment: Gait belt;Oxygen Activity Tolerance: Patient tolerated treatment well Patient left: in bed;with call bell/phone within reach     Time: 8299-3716 PT Time Calculation (min) (ACUTE ONLY): 12 min  Charges:  $Gait Training: 8-22 mins                    G Codes:      Despina Pole 04-25-2014, 4:37 PM Carita Pian. Sanjuana Kava, [place] Pager [phone]

## 2014-04-04 NOTE — Progress Notes (Signed)
Contacted by Yvonne Kern RN.  He states he was notified by laboratory that Yvonne Ballard is C. Diff positive.  She was started on Flagyl per the treatment protocol.  Kataya Guimont PA-C

## 2014-04-04 NOTE — Progress Notes (Signed)
dc'ed chest tube pt tolerated well  X- ray done

## 2014-04-04 NOTE — Progress Notes (Addendum)
[address]       [place] [address]             [phone]      4 Days Post-Op Procedure(s) (LRB): BIO-BENTALL PROCEDURE using a 34mm Magna Ease Aortic Tissue Valve, a 26 mm Terumo Gelweave Valsalva Graft, a 14x10x5mm Terumo Gelweave Graft, and a 46mm straight Hemashield Platinum Graft. (N/A) TRANSESOPHAGEAL ECHOCARDIOGRAM (TEE) (N/A) Subjective: Feels pretty well, minimal pain  Objective: Vital signs in last 24 hours: Temp:  [98.2 F (36.8 C)-98.7 F (37.1 C)] 98.2 F (36.8 C) (01/22 0605) Pulse Rate:  [71-88] 80 (01/22 0605) Cardiac Rhythm:  [-] Atrial paced (01/21 2029) Resp:  [10-21] 18 (01/22 0423) BP: (93-164)/(47-76) 140/70 mmHg (01/22 0702) SpO2:  [93 %-100 %] 96 % (01/22 0423) Weight:  [117 lb 15.1 oz (53.5 kg)] 117 lb 15.1 oz (53.5 kg) (01/22 0423)  Hemodynamic parameters for last 24 hours:    Intake/Output from previous day: 01/21 0701 - 01/22 0700 In: 806 [P.O.:800; I.V.:6] Out: 1400 [Urine:1250; Chest Tube:150] Intake/Output this shift:    General appearance: alert, cooperative and no distress Heart: regular rate and rhythm and 2/6 syst murmur Lungs: mildly dim in bases Abdomen: benign Extremities: no sig edema Wound: incis healing well  Lab Results:  Recent Labs  04/03/14 0500 04/04/14 0437  WBC 11.2* 10.3  HGB 12.0 12.6  HCT 36.4 38.4  PLT 134* 145*   BMET:  Recent Labs  04/03/14 0500 04/04/14 0437  NA 138 139  K 3.2* 4.2  CL 99 101  CO2 30 30  GLUCOSE 74 112*  BUN 27* 25*  CREATININE 0.79 0.82  CALCIUM 7.9* 8.0*    PT/INR: No results for input(s): LABPROT, INR in the last 72 hours. ABG    Component Value Date/Time   PHART 7.333* 04/01/2014 0003   HCO3 26.6* 04/01/2014 0003   TCO2 24 04/01/2014 1557   ACIDBASEDEF 1.0 03/31/2014 1601   O2SAT 96.0 04/01/2014 0003   CBG (last 3)   Recent Labs  04/03/14 0728 04/03/14 0758 04/03/14 1119  GLUCAP 66* 106* 131*    Meds Scheduled Meds: .  acetaminophen  1,000 mg Oral 4 times per day  . antiseptic oral rinse  7 mL Mouth Rinse BID  . aspirin EC  325 mg Oral Daily  . bisacodyl  10 mg Oral Daily   Or  . bisacodyl  10 mg Rectal Daily  . docusate sodium  200 mg Oral Daily  . furosemide  40 mg Oral Daily  . hydrALAZINE  25 mg Oral 4 times per day  . metoprolol tartrate  25 mg Oral BID  . multivitamin with minerals  1 tablet Oral Daily  . pantoprazole  40 mg Oral Daily  . potassium chloride  40 mEq Oral Daily  . sodium chloride  3 mL Intravenous Q12H  . sodium chloride  3 mL Intravenous Q12H   Continuous Infusions:  PRN Meds:.sodium chloride, hydrALAZINE, HYDROcodone-acetaminophen, metoprolol, ondansetron (ZOFRAN) IV, sodium chloride, sodium chloride, traMADol  Xrays Dg Chest Port 1 View  04/03/2014   CLINICAL DATA:  Cardiac valve replacement.  EXAM: PORTABLE CHEST - 1 VIEW  COMPARISON:  04/02/2014.  FINDINGS: Right IJ line sheath in stable position. Mediastinal drainage catheter stable position. Interim removal of pericardial drainage catheter. Stable cardiomegaly. Prior aortic valve replacement. Persistent bibasilar atelectasis. Developing right lower lobe infiltrate cannot be excluded. Persistent small bilateral pleural effusions. No pneumothorax. Surgical staples noted over the right chest.  IMPRESSION: 1. Interim removal of pericardial drainage catheter. Right IJ sheath and mediastinal drainage catheter in stable position. 2. Persistent bibasilar atelectasis with possible developing right lower lobe infiltrate.   Electronically Signed   By: Marcello Moores  Register   On: 04/03/2014 08:02    Assessment/Plan: S/P Procedure(s) (LRB): BIO-BENTALL PROCEDURE using a 81mm Magna Ease Aortic Tissue Valve, a 26 mm Terumo Gelweave Valsalva Graft, a 14x10x74mm Terumo Gelweave Graft, and a 72mm straight Hemashield Platinum Graft. (N/A) TRANSESOPHAGEAL ECHOCARDIOGRAM (TEE) (N/A)  1 stable  2 apaced at 80, in 70's underneath 3 CT 150 cc out  yest- will d/c tube 4 labs stable, thrombocytopenia improved 5 sugars well controlled 6 CXR improved atx/ effusions 7 mild volume overload- cont gentle diuresis  LOS: 4 days    GOLD,WAYNE E 04/04/2014  Will need at least one week therapy for C diff Will need HHN and home PT at DC next week DC temp pacer generator and roll/tape wires when rate maintained > 70  patient examined and medical record reviewed,agree with above note. VAN TRIGT III,Rolena Knutson 04/04/2014

## 2014-04-04 NOTE — Progress Notes (Signed)
CARDIAC REHAB PHASE I   PRE:  Rate/Rhythm: 80 Pacing   BP:  Supine:   Sitting: 121/43  Standing:    SaO2: 89 2 1/2 L  MODE:  Ambulation: 150 ft   POST:  Rate/Rhythm: 89 Pacing PVC's  BP:  Supine:   Sitting: 137/60  Standing:    SaO2: 95 3L 0922-1000 Assisted X 2 used rollator and O2 3L to ambulate. Gait steady with walker. Pt wants to walk fast to get finished. We encouraged her to walk a steady pace. She tires as she walks but much improved today.We had her take a couple of standing rest stops. VS stable Pt to recliner after with call light in reach.  Rodney Langton RN 04/04/2014 9:58 AM

## 2014-04-05 LAB — BASIC METABOLIC PANEL
Anion gap: 12 (ref 5–15)
BUN: 16 mg/dL (ref 6–23)
CO2: 25 mmol/L (ref 19–32)
Calcium: 7.8 mg/dL — ABNORMAL LOW (ref 8.4–10.5)
Chloride: 103 mmol/L (ref 96–112)
Creatinine, Ser: 0.72 mg/dL (ref 0.50–1.10)
GFR calc Af Amer: >90 mL/min (ref >90)
GFR calc non Af Amer: 82 mL/min — ABNORMAL LOW (ref >90)
Glucose, Bld: 107 mg/dL — ABNORMAL HIGH (ref 70–99)
Potassium: 3.6 mmol/L (ref 3.5–5.1)
Sodium: 140 mmol/L (ref 135–145)

## 2014-04-05 LAB — CBC
HCT: 39.3 % (ref 36.0–46.0)
Hemoglobin: 13 g/dL (ref 12.0–15.0)
MCH: 30.2 pg (ref 26.0–34.0)
MCHC: 33.1 g/dL (ref 30.0–36.0)
MCV: 91.4 fL (ref 78.0–100.0)
Platelets: 123 10*3/uL — ABNORMAL LOW (ref 150–400)
RBC: 4.3 MIL/uL (ref 3.87–5.11)
RDW: 14.5 % (ref 11.5–15.5)
WBC: 8.8 10*3/uL (ref 4.0–10.5)

## 2014-04-05 NOTE — Progress Notes (Addendum)
[address]       [place] [address]             [phone]      5 Days Post-Op Procedure(s) (LRB): BIO-BENTALL PROCEDURE using a 69mm Magna Ease Aortic Tissue Valve, a 26 mm Terumo Gelweave Valsalva Graft, a 14x10x56mm Terumo Gelweave Graft, and a 40mm straight Hemashield Platinum Graft. (N/A) TRANSESOPHAGEAL ECHOCARDIOGRAM (TEE) (N/A) Subjective: Feels ok, stooling every few hours, no abdominal pain  Objective: Vital signs in last 24 hours: Temp:  [98.1 F (36.7 C)-98.2 F (36.8 C)] 98.2 F (36.8 C) (01/23 0522) Pulse Rate:  [66-80] 80 (01/23 0651) Cardiac Rhythm:  [-] Atrial paced (01/22 1930) Resp:  [18-19] 18 (01/23 0651) BP: (116-168)/(68-88) 158/68 mmHg (01/23 0651) SpO2:  [92 %-97 %] 93 % (01/23 0651) Weight:  [113 lb 15.7 oz (51.7 kg)] 113 lb 15.7 oz (51.7 kg) (01/23 0432)  Hemodynamic parameters for last 24 hours:    Intake/Output from previous day: 01/22 0701 - 01/23 0700 In: 120 [P.O.:120] Out: 300 [Urine:300] Intake/Output this shift:    General appearance: alert, cooperative and no distress Heart: regular rate and rhythm Lungs: mild dim in the bases Abdomen: benign Extremities: no edema Wound: incis healing well  Lab Results:  Recent Labs  04/04/14 0437 04/05/14 0419  WBC 10.3 8.8  HGB 12.6 13.0  HCT 38.4 39.3  PLT 145* 123*   BMET:  Recent Labs  04/04/14 0437 04/05/14 0419  NA 139 140  K 4.2 3.6  CL 101 103  CO2 30 25  GLUCOSE 112* 107*  BUN 25* 16  CREATININE 0.82 0.72  CALCIUM 8.0* 7.8*    PT/INR: No results for input(s): LABPROT, INR in the last 72 hours. ABG    Component Value Date/Time   PHART 7.333* 04/01/2014 0003   HCO3 26.6* 04/01/2014 0003   TCO2 24 04/01/2014 1557   ACIDBASEDEF 1.0 03/31/2014 1601   O2SAT 96.0 04/01/2014 0003   CBG (last 3)   Recent Labs  04/03/14 0728 04/03/14 0758 04/03/14 1119  GLUCAP 66* 106* 131*    Meds Scheduled Meds: . acetaminophen  1,000 mg Oral 4  times per day  . antiseptic oral rinse  7 mL Mouth Rinse BID  . aspirin EC  325 mg Oral Daily  . furosemide  40 mg Oral Daily  . hydrALAZINE  25 mg Oral 4 times per day  . metoprolol tartrate  25 mg Oral BID  . metroNIDAZOLE  500 mg Oral 3 times per day  . multivitamin with minerals  1 tablet Oral Daily  . pantoprazole  40 mg Oral Daily  . potassium chloride  40 mEq Oral Daily  . sodium chloride  3 mL Intravenous Q12H  . sodium chloride  3 mL Intravenous Q12H   Continuous Infusions:  PRN Meds:.sodium chloride, hydrALAZINE, HYDROcodone-acetaminophen, metoprolol, ondansetron (ZOFRAN) IV, sodium chloride, sodium chloride, traMADol  Xrays Dg Chest 2 View  04/04/2014   CLINICAL DATA:  Aortic valve replacement procedure  EXAM: CHEST  2 VIEW  COMPARISON:  04/03/2014  FINDINGS: Right neck introducer sheath removed. Percutaneous pacemaker wires stable. Aortic valve replaced. Mediastinal tubes stable. No pneumothorax. Small bilateral pleural effusions and bibasilar atelectasis improved.  IMPRESSION: Improved bilateral effusions and bibasilar atelectasis.   Electronically Signed   By: Maryclare Bean M.D.   On: 04/04/2014 08:04   Dg Chest Port 1v Same Day  04/04/2014   CLINICAL DATA:  Status post chest tube removal with chest  pain  EXAM: PORTABLE CHEST - 1 VIEW SAME DAY  COMPARISON:  04/04/2014  FINDINGS: Cardiac shadow is stable. Postoperative changes are again seen. Small bilateral pleural effusions are again seen. Bibasilar atelectasis is likely present as well. No pneumothorax is identified. No gross bony abnormality is seen. The mediastinal drain is been removed in the interval.  IMPRESSION: Stable bilateral pleural effusions and likely bilateral atelectasis.   Electronically Signed   By: Inez Catalina M.D.   On: 04/04/2014 14:05    Assessment/Plan: S/P Procedure(s) (LRB): BIO-BENTALL PROCEDURE using a 35mm Magna Ease Aortic Tissue Valve, a 26 mm Terumo Gelweave Valsalva Graft, a 14x10x81mm Terumo  Gelweave Graft, and a 55mm straight Hemashield Platinum Graft. (N/A) TRANSESOPHAGEAL ECHOCARDIOGRAM (TEE) (N/A)  1 stable 2 on flagyl for cdiff 3 labs stable 4 sinus in 70's- will d/c pacer 5 push rehab as able- making good progress     LOS: 5 days    Yvonne Ballard,Yvonne Ballard 04/05/2014  No abdominal tenderness , frequent small stools on flagyl po for cdiff I have seen and examined Keaundra Verdi and agree with the above assessment  and plan.  Grace Isaac MD Beeper [phone] Office [phone] 04/05/2014 11:27 AM

## 2014-04-05 NOTE — Progress Notes (Signed)
Disconnected external pacer from patient. Rolled and taped wires to skin. Frequent PVC's noted and increased HR to 144. HR back in the 80's within 10 minutes. Will continue to monitor closely

## 2014-04-05 NOTE — Progress Notes (Signed)
Asked patient about walking in the hallways again and she said maybe later. I will try again after dinner. Glade Nurse, RN

## 2014-04-05 NOTE — Progress Notes (Signed)
CARDIAC REHAB PHASE I   PRE:  Rate/Rhythm: Sinus frequent PAC 66  BP:    Sitting: 147/56     SaO2: 94% Room air  MODE:  Ambulation: 150 ft   POST:  Rate/Rhythem: Sinus 69  BP:    Sitting: 154/76     SaO2: 96% Room Air  11:00-11:40 Patient ambulated in the hallway without difficulty using a rolling walker with assistance time one. Patient assisted back to recliner with call bell within reach. Encouraged patient to use incentive spirometer.  Whitaker, Christa See RN

## 2014-04-06 ENCOUNTER — Other Ambulatory Visit: Payer: Self-pay

## 2014-04-06 LAB — BASIC METABOLIC PANEL
ANION GAP: 7 (ref 5–15)
BUN: 12 mg/dL (ref 6–23)
CALCIUM: 8.1 mg/dL — AB (ref 8.4–10.5)
CHLORIDE: 104 mmol/L (ref 96–112)
CO2: 27 mmol/L (ref 19–32)
CREATININE: 0.65 mg/dL (ref 0.50–1.10)
GFR calc Af Amer: >90 mL/min (ref >90)
GFR, EST NON AFRICAN AMERICAN: 85 mL/min — AB (ref >90)
GLUCOSE: 144 mg/dL — AB (ref 70–99)
Potassium: 3.3 mmol/L — ABNORMAL LOW (ref 3.5–5.1)
Sodium: 138 mmol/L (ref 135–145)

## 2014-04-06 LAB — MAGNESIUM: Magnesium: 1.8 mg/dL (ref 1.5–2.5)

## 2014-04-06 LAB — TSH: TSH: 3.073 u[IU]/mL (ref 0.350–4.500)

## 2014-04-06 MED ORDER — HYDRALAZINE HCL 50 MG PO TABS
50.0000 mg | ORAL_TABLET | Freq: Four times a day (QID) | ORAL | Status: DC
  Administered 2014-04-06 – 2014-04-07 (×4): 50 mg via ORAL
  Filled 2014-04-06 (×8): qty 1

## 2014-04-06 MED ORDER — AMIODARONE LOAD VIA INFUSION: 150.0000 mg | Freq: Once | INTRAVENOUS | Status: DC

## 2014-04-06 MED ORDER — AMIODARONE HCL 200 MG PO TABS: 400.0000 mg | ORAL_TABLET | Freq: Every day | ORAL | Status: DC

## 2014-04-06 MED ORDER — AMIODARONE HCL 200 MG PO TABS
400.0000 mg | ORAL_TABLET | Freq: Two times a day (BID) | ORAL | Status: DC
  Administered 2014-04-06 (×2): 400 mg via ORAL
  Filled 2014-04-06 (×4): qty 2

## 2014-04-06 NOTE — Progress Notes (Addendum)
[address]       [place] [address]             [phone]      6 Days Post-Op Procedure(s) (LRB): BIO-BENTALL PROCEDURE using a 27mm Magna Ease Aortic Tissue Valve, a 26 mm Terumo Gelweave Valsalva Graft, a 14x10x13mm Terumo Gelweave Graft, and a 57mm straight Hemashield Platinum Graft. (N/A) TRANSESOPHAGEAL ECHOCARDIOGRAM (TEE) (N/A) Subjective: Has gone into afib with RVR, slowed some with IV lopressor. Stooling is less frequent  Objective: Vital signs in last 24 hours: Temp:  [98.4 F (36.9 C)-98.5 F (36.9 C)] 98.5 F (36.9 C) (01/24 0512) Pulse Rate:  [65-73] 73 (01/24 0512) Cardiac Rhythm:  [-] Normal sinus rhythm (01/23 1945) Resp:  [18-22] 20 (01/24 0512) BP: (153-175)/(60-73) 167/70 mmHg (01/24 0512) SpO2:  [93 %-94 %] 94 % (01/24 0512) Weight:  [111 lb 15.9 oz (50.8 kg)] 111 lb 15.9 oz (50.8 kg) (01/24 0512)  Hemodynamic parameters for last 24 hours:    Intake/Output from previous day: 01/23 0701 - 01/24 0700 In: 240 [P.O.:240] Out: -  Intake/Output this shift:    General appearance: alert, cooperative and no distress Heart: irregularly irregular rhythm and tachy Lungs: mildly dim in the bases Abdomen: benign Extremities: no edema Wound: healing well  Lab Results:  Recent Labs  04/04/14 0437 04/05/14 0419  WBC 10.3 8.8  HGB 12.6 13.0  HCT 38.4 39.3  PLT 145* 123*   BMET:  Recent Labs  04/04/14 0437 04/05/14 0419  NA 139 140  K 4.2 3.6  CL 101 103  CO2 30 25  GLUCOSE 112* 107*  BUN 25* 16  CREATININE 0.82 0.72  CALCIUM 8.0* 7.8*    PT/INR: No results for input(s): LABPROT, INR in the last 72 hours. ABG    Component Value Date/Time   PHART 7.333* 04/01/2014 0003   HCO3 26.6* 04/01/2014 0003   TCO2 24 04/01/2014 1557   ACIDBASEDEF 1.0 03/31/2014 1601   O2SAT 96.0 04/01/2014 0003   CBG (last 3)   Recent Labs  04/03/14 0728 04/03/14 0758 04/03/14 1119  GLUCAP 66* 106* 131*    Meds Scheduled  Meds: . antiseptic oral rinse  7 mL Mouth Rinse BID  . aspirin EC  325 mg Oral Daily  . furosemide  40 mg Oral Daily  . hydrALAZINE  25 mg Oral 4 times per day  . metoprolol tartrate  25 mg Oral BID  . metroNIDAZOLE  500 mg Oral 3 times per day  . multivitamin with minerals  1 tablet Oral Daily  . pantoprazole  40 mg Oral Daily  . potassium chloride  40 mEq Oral Daily  . sodium chloride  3 mL Intravenous Q12H  . sodium chloride  3 mL Intravenous Q12H   Continuous Infusions:  PRN Meds:.sodium chloride, hydrALAZINE, HYDROcodone-acetaminophen, metoprolol, ondansetron (ZOFRAN) IV, sodium chloride, sodium chloride, traMADol  Xrays Dg Chest 2 View  04/04/2014   CLINICAL DATA:  Aortic valve replacement procedure  EXAM: CHEST  2 VIEW  COMPARISON:  04/03/2014  FINDINGS: Right neck introducer sheath removed. Percutaneous pacemaker wires stable. Aortic valve replaced. Mediastinal tubes stable. No pneumothorax. Small bilateral pleural effusions and bibasilar atelectasis improved.  IMPRESSION: Improved bilateral effusions and bibasilar atelectasis.   Electronically Signed   By: Maryclare Bean M.D.   On: 04/04/2014 08:04   Dg Chest Port 1v Same Day  04/04/2014   CLINICAL DATA:  Status post chest tube removal with chest pain  EXAM: PORTABLE  CHEST - 1 VIEW SAME DAY  COMPARISON:  04/04/2014  FINDINGS: Cardiac shadow is stable. Postoperative changes are again seen. Small bilateral pleural effusions are again seen. Bibasilar atelectasis is likely present as well. No pneumothorax is identified. No gross bony abnormality is seen. The mediastinal drain is been removed in the interval.  IMPRESSION: Stable bilateral pleural effusions and likely bilateral atelectasis.   Electronically Signed   By: Inez Catalina M.D.   On: 04/04/2014 14:05    Assessment/Plan: S/P Procedure(s) (LRB): BIO-BENTALL PROCEDURE using a 61mm Magna Ease Aortic Tissue Valve, a 26 mm Terumo Gelweave Valsalva Graft, a 14x10x91mm Terumo Gelweave  Graft, and a 89mm straight Hemashield Platinum Graft. (N/A) TRANSESOPHAGEAL ECHOCARDIOGRAM (TEE) (N/A)  1 afib- will start amio protocol- will give po with only periph IV 2 cont flagyl for cdiff 3 repeat lytes including Mg++ 4 increase hydralazine for HTN    LOS: 6 days    Yvonne Ballard,Yvonne Ballard 04/06/2014  Now in afib, no central line started on po cordarone Still frequent stools- if persistents consider changing to po vanco I have seen and examined Yvonne Ballard and agree with the above assessment  and plan.  Grace Isaac MD Beeper [phone] Office [phone] 04/06/2014 11:01 AM

## 2014-04-06 NOTE — Progress Notes (Signed)
Patient ambulated 150 feet using rolling walker and on room air.  Patient tolerated well.  RN will continue to monitor patient

## 2014-04-06 NOTE — Progress Notes (Signed)
Patient declined to ambulate this morning and stated that she would go after lunch. Ambulated 150 feet with RW. Patient tolerated well.

## 2014-04-06 NOTE — Progress Notes (Signed)
Patient with heart rate 130-140's and converted rhythm to Atrial Fibrillation.  Patient resting comfortably in bed. EKG obtained verifying Atrial fibrillation. Lopressor 5 mg via IV administered per MD order. Heart rate 10 minutes after administration 115.  Patient resting comfortably in bed with call bell within reach.  RN will continue to monitor patient.

## 2014-04-06 NOTE — Anesthesia Postprocedure Evaluation (Signed)
  Anesthesia Post-op Note  Patient: Yvonne Ballard  Procedure(s) Performed: Procedure(s): BIO-BENTALL PROCEDURE using a 39mm Magna Ease Aortic Tissue Valve, a 26 mm Terumo Gelweave Valsalva Graft, a 14x10x51mm Terumo Gelweave Graft, and a 72mm straight Hemashield Platinum Graft. (N/A) TRANSESOPHAGEAL ECHOCARDIOGRAM (TEE) (N/A)  Patient Location: SICU  Anesthesia Type:General  Level of Consciousness: sedated, unresponsive and Patient remains intubated per anesthesia plan  Airway and Oxygen Therapy: Patient Spontanous Breathing  Post-op Pain: none  Post-op Assessment: Post-op Vital signs reviewed  Post-op Vital Signs: stable  Last Vitals:  Filed Vitals:   04/06/14 0512  BP: 167/70  Pulse: 73  Temp: 36.9 C  Resp: 20    Complications: No apparent anesthesia complications

## 2014-04-06 NOTE — Clinical Social Work Psychosocial (Signed)
Clinical Social Work Department BRIEF PSYCHOSOCIAL ASSESSMENT 04/06/2014  Patient:  Yvonne Ballard,Yvonne Ballard     Account Number:  [id number]     Admit date:  03/31/2014  Clinical Social Worker:  CAMPBELL,JOSEPH BRYANT, LCSWA  Date/Time:  04/06/2014 11:34 AM  Referred by:  Physician  Date Referred:  04/06/2014 Referred for  SNF Placement   Other Referral:   NA   Interview type:  Patient Other interview type:   Patient alert and oriented at time of assessment.    PSYCHOSOCIAL DATA Living Status:  FAMILY Admitted from facility:   Level of care:   Primary support name:  Kathleen Primary support relationship to patient:  CHILD, ADULT Degree of support available:   Support is good.    CURRENT CONCERNS Current Concerns  Post-Acute Placement   Other Concerns:   NA    SOCIAL WORK ASSESSMENT / PLAN CSW met with patient at bedside to complete assessment. Patient admitted from home. CSW explained that PT has recommended SNF placement for patient for rehab. Patient refuses SNF placement at this time, and will not consider the option. Patient states, "A rehab facility is just like this, it's so clinical, it's not home." Patient states that her daughter will be staying with her to provide 24/7 supervision and assistance for her at discharge. CSW will NOT start SNF search process as patient is refusing.   Assessment/plan status:  No Further Intervention Required Other assessment/ plan:   NA   Information/referral to community resources:   NA    PATIENT'S/FAMILY'S RESPONSE TO PLAN OF CARE: Patient states that she will be returning home at discharge, and is unwilling to consider SNF. CSW signing off at this time.       Bryant Campbell MSW, LCSWA, LCASA, [phone] 

## 2014-04-07 LAB — BASIC METABOLIC PANEL
ANION GAP: 11 (ref 5–15)
BUN: 11 mg/dL (ref 6–23)
CALCIUM: 8 mg/dL — AB (ref 8.4–10.5)
CO2: 23 mmol/L (ref 19–32)
Chloride: 104 mmol/L (ref 96–112)
Creatinine, Ser: 0.68 mg/dL (ref 0.50–1.10)
GFR calc non Af Amer: 83 mL/min — ABNORMAL LOW (ref >90)
Glucose, Bld: 105 mg/dL — ABNORMAL HIGH (ref 70–99)
POTASSIUM: 3.5 mmol/L (ref 3.5–5.1)
SODIUM: 138 mmol/L (ref 135–145)

## 2014-04-07 LAB — HEPATIC FUNCTION PANEL
ALT: 64 U/L — AB (ref 0–35)
AST: 66 U/L — ABNORMAL HIGH (ref 0–37)
Albumin: 2.5 g/dL — ABNORMAL LOW (ref 3.5–5.2)
Alkaline Phosphatase: 71 U/L (ref 39–117)
BILIRUBIN DIRECT: 0.2 mg/dL (ref 0.0–0.5)
BILIRUBIN TOTAL: 0.7 mg/dL (ref 0.3–1.2)
Indirect Bilirubin: 0.5 mg/dL (ref 0.3–0.9)
TOTAL PROTEIN: 4.9 g/dL — AB (ref 6.0–8.3)

## 2014-04-07 MED ORDER — AMIODARONE HCL 200 MG PO TABS: 400.0000 mg | ORAL_TABLET | Freq: Every day | ORAL | Status: DC

## 2014-04-07 MED ORDER — HYDRALAZINE HCL 50 MG PO TABS
100.0000 mg | ORAL_TABLET | Freq: Two times a day (BID) | ORAL | Status: DC
  Administered 2014-04-07 – 2014-04-08 (×2): 100 mg via ORAL
  Filled 2014-04-07 (×3): qty 2

## 2014-04-07 MED ORDER — AMIODARONE HCL 200 MG PO TABS
200.0000 mg | ORAL_TABLET | Freq: Two times a day (BID) | ORAL | Status: DC
  Administered 2014-04-07: 200 mg via ORAL
  Filled 2014-04-07 (×2): qty 1

## 2014-04-07 MED ORDER — AMIODARONE HCL 200 MG PO TABS
200.0000 mg | ORAL_TABLET | Freq: Every day | ORAL | Status: DC
  Filled 2014-04-07: qty 1

## 2014-04-07 MED ORDER — LISINOPRIL 10 MG PO TABS
10.0000 mg | ORAL_TABLET | Freq: Every day | ORAL | Status: DC
  Administered 2014-04-07: 10 mg via ORAL
  Filled 2014-04-07 (×2): qty 1

## 2014-04-07 MED ORDER — POTASSIUM CHLORIDE CRYS ER 20 MEQ PO TBCR
40.0000 meq | EXTENDED_RELEASE_TABLET | Freq: Once | ORAL | Status: AC
  Administered 2014-04-07: 40 meq via ORAL
  Filled 2014-04-07: qty 2

## 2014-04-07 NOTE — Progress Notes (Addendum)
      [address]       [place] [address]             [phone]        7 Days Post-Op Procedure(s) (LRB): BIO-BENTALL PROCEDURE using a 53mm Magna Ease Aortic Tissue Valve, a 26 mm Terumo Gelweave Valsalva Graft, a 14x10x46mm Terumo Gelweave Graft, and a 66mm straight Hemashield Platinum Graft. (N/A) TRANSESOPHAGEAL ECHOCARDIOGRAM (TEE) (N/A) AORTIC ARCH RECONSTRUCTION Subjective: Patient reporting less stools POSTOP c DIFF Objective: Vital signs in last 24 hours: Temp:  [98.6 F (37 C)-98.7 F (37.1 C)] 98.6 F (37 C) (01/25 0500) Pulse Rate:  [71-96] 96 (01/25 0500) Cardiac Rhythm:  [-] Normal sinus rhythm (01/24 2036) Resp:  [20] 20 (01/25 0500) BP: (123-150)/(49-62) 150/62 mmHg (01/25 0500) SpO2:  [92 %-93 %] 93 % (01/25 0500) Weight:  [114 lb 6.7 oz (51.9 kg)] 114 lb 6.7 oz (51.9 kg) (01/25 0500)  Pre op weight 48 kg Current Weight  04/07/14 114 lb 6.7 oz (51.9 kg)      Intake/Output from previous day: 01/24 0701 - 01/25 0700 In: 240 [P.O.:240] Out: -    Physical Exam:  Cardiovascular: RRR, no murmurs, gallops, or rubs. Pulmonary: Clear to auscultation bilaterally; no rales, wheezes, or rhonchi. Abdomen: Soft, non tender, bowel sounds present. Extremities: Mild bilateral lower extremity edema. Wounds: Clean and dry.  No erythema or signs of infection.  Lab Results: CBC: Recent Labs  04/05/14 0419  WBC 8.8  HGB 13.0  HCT 39.3  PLT 123*   BMET:  Recent Labs  04/06/14 0905 04/07/14 0544  NA 138 138  K 3.3* 3.5  CL 104 104  CO2 27 23  GLUCOSE 144* 105*  BUN 12 11  CREATININE 0.65 0.68  CALCIUM 8.1* 8.0*    PT/INR:  Lab Results  Component Value Date   INR 1.29 03/31/2014   INR 1.02 03/28/2014   INR 1.0 03/25/2014   ABG:  INR: Will add last result for INR, ABG once components are confirmed Will add last 4 CBG results once components are confirmed  Assessment/Plan:  1. CV - Previous atrial fibrillation with  RVR. SR in the 60's. On Amiodarone 400 bid, Lopressor 25 bid, Hydralazine 50 QID. Will change Hydralazine to 100 bid. 2.  Pulmonary - Encourage incentive spirometer 3. Volume Overload - On Lasix 40 daily 4.  Acute blood loss anemia - H and H stable 13 and 39.3 5. Supplement potassium 6. C. Dif-continue Flagyl 7. Remove EPW 8. Patient is refusing SNF. Will have daughter available. Possibly in am.  ZIMMERMAN,DONIELLE MPA-C 04/07/2014,7:26 AM  Reduce amiodarone to 200 bid Cont po flagyl 10 days at home [facility] for restorative care , home PT  patient examined and medical record reviewed,agree with above note. Home in am VAN TRIGT III,PETER 04/07/2014

## 2014-04-07 NOTE — Progress Notes (Signed)
CARDIAC REHAB PHASE I   PRE:  Rate/Rhythm: 73 SR    BP: sitting 153/63    SaO2: 97 RA  MODE:  Ambulation: 350 ft   POST:  Rate/Rhythm: 76 SR    BP: sitting 140/82     SaO2: 97 RA  Tolerated well with rollator. Steady, quick pace. Reminded pt to rest when she needs to. Increased distance. To recliner. Only c/o is lightheadedness when up. BP stable. Has RW at home. Reminded pt to be careful when walking to BR as she is moving independently in room.  [place], [place], ACSM 04/07/2014 2:31 PM

## 2014-04-07 NOTE — Care Management Note (Addendum)
    Page 1 of 2   04/08/2014     3:20:25 PM CARE MANAGEMENT NOTE 04/08/2014  Patient:  Yvonne Ballard, Yvonne Ballard   Account Number:  [id number]  Date Initiated:  04/07/2014  Documentation initiated by:  Marvetta Gibbons  Subjective/Objective Assessment:   Pt admitted s/p Bentall procedure     Action/Plan:   PTA pt lived at home- PT/OT EVALS   Anticipated DC Date:  04/08/2014   Anticipated DC Plan:  [place]  CM consult      [place]   Choice offered to / List presented to:  C-1 Patient        [place] arranged  HH-1 RN  [name]   Status of service:  Completed, signed off Medicare Important Message given?  YES (If response is "NO", the following Medicare IM given date fields will be blank) Date Medicare IM given:  04/08/2014 Medicare IM given by:  Marvetta Gibbons Date Additional Medicare IM given:   Additional Medicare IM given by:    Discharge Disposition:  [place]  Per UR Regulation:  Reviewed for med. necessity/level of care/duration of stay  If discussed at [place] of Stay Meetings, dates discussed:   04/08/2014    Comments:  04/08/14- 1500- Yvonne Ballard, BSN [phone] Per [facility]- pt has Medicare primary-ID- [id number] A- and [employer] secondary- pt for d/c home today MD to place F2F for home health orders.    04/07/14- 1530- Marvetta Gibbons RN, BSN [phone] Referral for [facility] -at discharge plan for home in am- pt will need RN/PT- spoke with pt at bedside- pt agreeable to [facility] services- states that she has used [facility] in the past for her husband- wants to use them for herself- referral called to [facility] with [facility] for HH-RN/PT- plan for d/c tomorrow

## 2014-04-07 NOTE — Discharge Summary (Signed)
Physician Discharge Summary  Patient ID: Yvonne Ballard MRN: [id number] DOB/AGE: 1940-03-29 74 y.o.  Admit date: 03/31/2014 Discharge date: 04/07/2014  Admission Diagnoses:  Patient Active Problem List   Diagnosis Date Noted  . Aortic aneurysm, thoracic 03/31/2014  . Ascending aortic aneurysm 03/25/2014  . 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 Benign essential HTN 03/18/2014  . LVH (left ventricular hypertrophy) due to hypertensive disease 03/18/2014  . Heart murmur 03/18/2014  . Chest pain 03/18/2014   Discharge Diagnoses:   Patient Active Problem List   Diagnosis Date Noted  . Aortic aneurysm, thoracic 03/31/2014  . Ascending aortic aneurysm 03/25/2014  . 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 Benign essential HTN 03/18/2014  . LVH (left ventricular hypertrophy) due to hypertensive disease 03/18/2014  . Heart murmur 03/18/2014  . Chest pain 03/18/2014   Discharged Condition: good  History of present illness:   Yvonne Ballard is a 74 yo female with known history of Hypertension.  She recently developed complaints of shortness of breath an orthopnea.  She was found to have aortic insufficiency and a 6.5 cm Ascending Aortic Aneurysm extending to the aortic arch.  Cardiac catheterization was performed and did not show evidence of significant coronary disease.  Due to these findings she was referred to [facility] for surgical intervention.  She was evaluated by Dr. Prescott Gum on 03/25/2014 at which time the patient denied any family history of Aortic aneurysmal disease.  Her symptoms remained unchanged and it was felt she should undergo surgical intervention to avoid dissection or rupture in the future.  The risks and benefits of the procedure were explained to the patient and she was agreeable to proceed.     Hospital Course:   Yvonne Ballard presented to [facility] on 03/31/2014.  She was taken to the operating room and underwent Bio-Bentall procedure with a 23 mm Magna Ease Aortic Tissue  Valve, replacement of the ascending aorta, Aorto-Brachiocephalic Bypass, and Aorto-Left Common carotid bypass.  She tolerated the procedure well and was taken to the SICU in stable condition.  The patient was extubated the evening of surgery.  During her stay in the SICU she was weaned off Nipride, Milrinone, and NTG drips as tolerated.  Her chest tubes and arterial lines were removed without difficulty.  She required Atrial Pacing.  She was ambulating around the unit and felt medically stable for transfer to the step down unit on POD #3.  The patient continued to progress.  She was maintaining NSR and her pacing wires were removed without difficulty.  She later developed Atrial Fibrillation and was treated with Amiodarone and Digoxin.  She is not felt to be a candidate for anticoagulation due to previous surgery and increased risk of pericardial effusion.  She continues to have issues with Hypertension and required addition of Hydralazine and Lisinopril.  She developed diarrhea which came back positive for C. Diff colitis.  She was started on Flagyl.  She was evaluated by PT/OT who felt patient would benefit from SNF placement, however the patient wished to go home.  Therefore home health has been arranged.  She will require oral Flagyl for 10 day complete therapy.  She is medically stable at this time.  Should no issues arise we anticipate discharge home 04/08/2014.            Significant Diagnostic Studies: cardiac graphics: Echocardiogram:   Left ventricle: The cavity size was moderately dilated. Wall thickness was increased in a pattern of moderate LVH. The estimated ejection fraction was  45%. Diffuse hypokinesis. 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 - Aorta: Possible ascending aortic disection severely dilated Discussed with Dr Burt Knack and suggested hospitalization and CTA right away. - Mitral valve: There  was mild regurgitation. - Left atrium: The atrium was mildly dilated. - Atrial septum: No defect or patent foramen ovale was identified. - Pulmonary arteries: PA peak pressure: 56 mm Hg (S).  Treatments: surgery:   1. Median sternotomy for aortic root replacement with a biologic -  Bentall procedure using a 23-mm pericardial valve [facility] [place]  Ease, serial number [id number]) and a 26-mm Valsalva graft with re-implantation of the cornaries using  hypothermic circulatory arrest with antegrade cerebral perfusion. 2. Replacement of the ascending aorta and full length aortic arch  using a trifurcated Gelweave graft, 14 x 10 x 10. 3. Elephant trunk reconstruction of the distal arch using a 28-mm  Hemashield platinum woven graft. 4. Placement of femoral A-line for blood pressure monitoring.  Disposition: 01-Home or Self Care   Discharge medications:    Medication List    STOP taking these medications        ALPRAZolam 0.25 MG tablet  Commonly known as:  XANAX     aspirin 81 MG tablet  Replaced by:  aspirin 325 MG EC tablet     hydrochlorothiazide 50 MG tablet  Commonly known as:  HYDRODIURIL     ibuprofen 200 MG tablet  Commonly known as:  ADVIL,MOTRIN     Potassium Chloride ER 20 MEQ Tbcr      TAKE these medications        amiodarone 400 MG tablet  Commonly known as:  PACERONE  Take 1 tablet (400 mg total) by mouth 2 (two) times daily. Take 400 mg twice a day for 2 days, then 200 mg twice a day     aspirin 325 MG EC tablet  Take 1 tablet (325 mg total) by mouth daily.     CARTIA XT 120 MG 24 hr capsule  Generic drug:  diltiazem  Take 120 mg by mouth daily.     digoxin 0.125 MG tablet  Commonly known as:  LANOXIN  Take 1 tablet (0.125 mg total) by mouth daily.     hydrALAZINE 100 MG tablet  Commonly known as:  APRESOLINE  Take 1 tablet (100 mg total) by mouth 2 (two) times daily.     HYDROcodone-acetaminophen 5-325 MG per tablet  Commonly known  as:  NORCO/VICODIN  Take 1-2 tablets by mouth every 4 (four) hours as needed for moderate pain or severe pain.     magnesium oxide 400 (241.3 MG) MG tablet  Commonly known as:  MAG-OX  Take 1 tablet (400 mg total) by mouth 2 (two) times daily.     metoprolol succinate 25 MG 24 hr tablet  Commonly known as:  TOPROL-XL  Take 1 tablet (25 mg total) by mouth daily.     metroNIDAZOLE 500 MG tablet  Commonly known as:  FLAGYL  Take 1 tablet (500 mg total) by mouth every 8 (eight) hours.     multivitamin with minerals Tabs tablet  Take 1 tablet by mouth daily.          The patient has been discharged on:   1.Beta Blocker:  Yes [x   ]  No   [   ]                              If No, reason:  2.Ace Inhibitor/ARB: Yes [   ]                                     No  [ x   ]                                     If No, reason: Cough with previous use  3.Statin:   Yes [   ]                  No  [ x  ]                  If No, reason: No CAD  4.[place]:  Yes  [ x  ]                  No   [   ]                  If No, reason:      Signed: BARRETT, ERIN 04/07/2014, 9:39 AM

## 2014-04-07 NOTE — Progress Notes (Signed)
Physical Therapy Treatment Patient Details Name: Yvonne Ballard MRN: [id number] DOB: Mar 26, 1940 Today's Date: 04/07/2014    History of Present Illness      PT Comments    Updated d/c plan to HHPT and 24-hour assist. Pt's daughter is flying in from [place] to stay with her.  Follow Up Recommendations  Home health PT;Supervision/Assistance - 24 hour (Pt's daughter is flying in from [place] to stay with her.)     Equipment Recommendations  Other (comment) (toilet riser, if unable to obtain then 3n1)    Recommendations for Other Services       Precautions / Restrictions Precautions Precautions: Sternal;Fall Precaution Comments: Reviewed sternal precautions. Restrictions Weight Bearing Restrictions: Yes Other Position/Activity Restrictions: sternal    Mobility  Bed Mobility         Supine to sit: Supervision Sit to supine: Supervision   General bed mobility comments: verbal cues for precautions  Transfers   Equipment used: Ambulation equipment used Transfers: Sit to/from Stand;Stand Pivot Transfers Sit to Stand: Supervision Stand pivot transfers: Min guard       General transfer comment: verbal cues for sternal precautions  Ambulation/Gait Ambulation/Gait assistance: Min guard Ambulation Distance (Feet): 175 Feet Assistive device: Rolling walker (2 wheeled) Gait Pattern/deviations: Step-through pattern;Decreased stride length Gait velocity: decreased   General Gait Details: O2 sats 95% after ambulation on RA.   Stairs            Wheelchair Mobility    Modified Rankin (Stroke Patients Only)       Balance                                    Cognition Arousal/Alertness: Awake/alert Behavior During Therapy: WFL for tasks assessed/performed Overall Cognitive Status: Within Functional Limits for tasks assessed                      Exercises      General Comments        Pertinent Vitals/Pain Pain Assessment:  No/denies pain    Home Living                      Prior Function            PT Goals (current goals can now be found in the care plan section) Progress towards PT goals: Progressing toward goals    Frequency  Min 3X/week    PT Plan Discharge plan needs to be updated    Co-evaluation             End of Session Equipment Utilized During Treatment: Gait belt Activity Tolerance: Patient tolerated treatment well Patient left: in chair;with call bell/phone within reach     Time: 6195-0932 PT Time Calculation (min) (ACUTE ONLY): 13 min  Charges:  $Gait Training: 8-22 mins                    G Codes:      Lorriane Shire 04/07/2014, 9:57 AM

## 2014-04-07 NOTE — Progress Notes (Signed)
EPW removed per order. Ends intact. VSS. Pt instructed of one hour bedrest. Verbalized understanding. Will continue to monitor pt cloesly. 

## 2014-04-07 NOTE — Discharge Instructions (Signed)
Aortic Valve Replacement, Care After Refer to this sheet in the next few weeks. These instructions provide you with information on caring for yourself after your procedure. Your health care provider may also give you specific instructions. Your treatment has been planned according to current medical practices, but problems sometimes occur. Call your health care provider if you have any problems or questions after your procedure. HOME CARE INSTRUCTIONS   Take medicines only as directed by your health care provider.  If your health care provider has prescribed elastic stockings, wear them as directed.  Take frequent naps or rest often throughout the day.  Avoid lifting over 10 lbs (4.5 kg) or pushing or pulling things with your arms for 6-8 weeks or as directed by your health care provider.  Avoid driving or airplane travel for 4-6 weeks after surgery or as directed by your health care provider. If you are riding in a car for an extended period, stop every 1-2 hours to stretch your legs. Keep a record of your medicines and medical history with you when traveling.  Do not drive or operate heavy machinery while taking pain medicine. (narcotics).  Do not cross your legs.  Do not use any tobacco products including cigarettes, chewing tobacco, or electronic cigarettes. If you need help quitting, ask your health care provider.  Do not take baths, swim, or use a hot tub until your health care provider approves. Take showers once your health care provider approves. Pat incisions dry. Do not rub incisions with a washcloth or towel.  Avoid climbing stairs and using the handrail to pull yourself up for the first 2-3 weeks after surgery.  Return to work as directed by your health care provider.  Drink enough fluid to keep your urine clear or pale yellow.  Do not strain to have a bowel movement. Eat high-fiber foods if you become constipated. You may also take a medicine to help you have a bowel  movement (laxative) as directed by your health care provider.  Resume sexual activity as directed by your health care provider. Men should not use medicines for erectile dysfunction until their doctor says it isokay.  If you had a certain type of heart condition in the past, you may need to take antibiotic medicine before having dental work or surgery. Let your dentist and health care providers know if you had one or more of the following:  Previous endocarditis.  An artificial (prosthetic) heart valve.  Congenital heart disease. SEEK MEDICAL CARE IF:  You develop a skin rash.   You experience sudden changes in your weight.  You have a fever. SEEK IMMEDIATE MEDICAL CARE IF:   You develop chest pain that is not coming from your incision.  You have drainage (pus), redness, swelling, or pain at your incision site.   You develop shortness of breath or have difficulty breathing.   You have increased bleeding from your incision site.   You develop light-headedness.  MAKE SURE YOU:   Understand these directions.  Will watch your condition.  Will get help right away if you are not doing well or get worse. Document Released: 09/16/2004 Document Revised: 07/15/2013 Document Reviewed: 12/13/2011 [facility] Patient Information 2015 [place], [place]. This information is not intended to replace advice given to you by your health care provider. Make sure you discuss any questions you have with your health care provider.

## 2014-04-08 LAB — BASIC METABOLIC PANEL
Anion gap: 9 (ref 5–15)
BUN: 11 mg/dL (ref 6–23)
CHLORIDE: 108 mmol/L (ref 96–112)
CO2: 22 mmol/L (ref 19–32)
Calcium: 8.1 mg/dL — ABNORMAL LOW (ref 8.4–10.5)
Creatinine, Ser: 0.72 mg/dL (ref 0.50–1.10)
GFR, EST NON AFRICAN AMERICAN: 82 mL/min — AB (ref >90)
Glucose, Bld: 105 mg/dL — ABNORMAL HIGH (ref 70–99)
POTASSIUM: 3.8 mmol/L (ref 3.5–5.1)
Sodium: 139 mmol/L (ref 135–145)

## 2014-04-08 MED ORDER — HYDROCHLOROTHIAZIDE 50 MG PO TABS: 100.0000 mg | ORAL_TABLET | Freq: Two times a day (BID) | ORAL | Status: DC

## 2014-04-08 MED ORDER — MAGNESIUM OXIDE 400 (241.3 MG) MG PO TABS: 400.0000 mg | ORAL_TABLET | Freq: Two times a day (BID) | ORAL | Status: DC

## 2014-04-08 MED ORDER — MAGNESIUM OXIDE 400 (241.3 MG) MG PO TABS
400.0000 mg | ORAL_TABLET | Freq: Two times a day (BID) | ORAL | Status: DC
  Administered 2014-04-08: 400 mg via ORAL
  Filled 2014-04-08 (×2): qty 1

## 2014-04-08 MED ORDER — METRONIDAZOLE 500 MG PO TABS: 500.0000 mg | ORAL_TABLET | Freq: Three times a day (TID) | ORAL | Status: DC

## 2014-04-08 MED ORDER — HYDRALAZINE HCL 100 MG PO TABS: 100.0000 mg | ORAL_TABLET | Freq: Two times a day (BID) | ORAL | Status: DC

## 2014-04-08 MED ORDER — AMIODARONE HCL 400 MG PO TABS: 400.0000 mg | ORAL_TABLET | Freq: Two times a day (BID) | ORAL | Status: DC

## 2014-04-08 MED ORDER — METOPROLOL SUCCINATE ER 25 MG PO TB24: 25.0000 mg | ORAL_TABLET | Freq: Every day | ORAL | Status: DC

## 2014-04-08 MED ORDER — DIGOXIN 125 MCG PO TABS: 0.1250 mg | ORAL_TABLET | Freq: Every day | ORAL | Status: DC

## 2014-04-08 MED ORDER — DILTIAZEM HCL ER COATED BEADS 120 MG PO CP24
120.0000 mg | ORAL_CAPSULE | Freq: Every day | ORAL | Status: DC
  Administered 2014-04-08: 120 mg via ORAL
  Filled 2014-04-08: qty 1

## 2014-04-08 MED ORDER — DIGOXIN 0.25 MG/ML IJ SOLN
0.2500 mg | Freq: Every day | INTRAMUSCULAR | Status: AC
  Administered 2014-04-08: 0.25 mg via INTRAVENOUS
  Filled 2014-04-08: qty 1

## 2014-04-08 MED ORDER — ADULT MULTIVITAMIN W/MINERALS CH: 1.0000 | ORAL_TABLET | Freq: Every day | ORAL | Status: DC

## 2014-04-08 MED ORDER — LISINOPRIL 20 MG PO TABS
20.0000 mg | ORAL_TABLET | Freq: Every day | ORAL | Status: DC
  Administered 2014-04-08: 20 mg via ORAL
  Filled 2014-04-08: qty 1

## 2014-04-08 MED ORDER — DIGOXIN 125 MCG PO TABS
0.1250 mg | ORAL_TABLET | Freq: Every day | ORAL | Status: DC
Start: 2014-04-09 — End: 2014-04-08

## 2014-04-08 MED ORDER — ASPIRIN 325 MG PO TBEC: 325.0000 mg | DELAYED_RELEASE_TABLET | Freq: Every day | ORAL | Status: DC

## 2014-04-08 MED ORDER — HYDROCODONE-ACETAMINOPHEN 5-325 MG PO TABS: 1.0000 | ORAL_TABLET | ORAL | Status: DC | PRN

## 2014-04-08 MED ORDER — AMIODARONE HCL 200 MG PO TABS
400.0000 mg | ORAL_TABLET | Freq: Two times a day (BID) | ORAL | Status: DC
  Administered 2014-04-08: 400 mg via ORAL
  Filled 2014-04-08 (×2): qty 2

## 2014-04-08 NOTE — Progress Notes (Signed)
CARDIAC REHAB PHASE I   PRE:  Rate/Rhythm: 91 afib    BP: sitting 97/49    SaO2: 94 RA  MODE:  Ambulation: 550 ft   POST:  Rate/Rhythm: 108 afib    BP: sitting 143/48     SaO2: 96 RA  Pt moving well. Encouraged pt to walk farther and she was able to. SOB after walk, SaO2 good. Daughters present and ed completed. Good reception. Pt given instructions for IS as she was doing it wrong. Once doing it properly only able to inspire about 500 mL. Encouraged more work with this. Pt interested in CRPII and will send referral to [place].  [place], [place], ACSM 04/08/2014 2:31 PM

## 2014-04-08 NOTE — Progress Notes (Signed)
Medicare Important Message given? YES  (If response is "NO", the following Medicare IM given date fields will be blank)  Date Medicare IM given: 04/08/14 Medicare IM given by:  Dahlia Client [employer]

## 2014-04-08 NOTE — Progress Notes (Signed)
Assessment unchanged. Discussed D/C instructions with pt including f/u appointments and new medications. CT sutures removed per order. 1/2" steri strips and Benzoin applied to site. Pt instructed to leave steris in place until Steris fall off on their own. Verbalized understanding.  RX given to pt. IV and tele removed. Pt left with belongings accompanied by [employer].

## 2014-04-08 NOTE — Progress Notes (Addendum)
8 Days Post-Op Procedure(s) (LRB): BIO-BENTALL PROCEDURE using a 82mm Magna Ease Aortic Tissue Valve, a 26 mm Terumo Gelweave Valsalva Graft, a 14x10x69mm Terumo Gelweave Graft, and a 39mm straight Hemashield Platinum Graft. (N/A) TRANSESOPHAGEAL ECHOCARDIOGRAM (TEE) (N/A) Subjective: Back in afib with frequent PVC's  Objective: Vital signs in last 24 hours: Temp:  [97.4 F (36.3 C)-98.7 F (37.1 C)] 97.4 F (36.3 C) (01/26 0435) Pulse Rate:  [62-85] 85 (01/26 0435) Cardiac Rhythm:  [-] Normal sinus rhythm (01/25 2030) Resp:  [16-18] 17 (01/26 0435) BP: (130-176)/(45-78) 130/78 mmHg (01/26 0435) SpO2:  [94 %-98 %] 94 % (01/26 0435) Weight:  [109 lb 14.4 oz (49.85 kg)] 109 lb 14.4 oz (49.85 kg) (01/26 0435)  Hemodynamic parameters for last 24 hours:    Intake/Output from previous day: 01/25 0701 - 01/26 0700 In: 360 [P.O.:360] Out: -  Intake/Output this shift:    General appearance: alert, cooperative and no distress Heart: irregularly irregular rhythm Lungs: mildly dim in bases Abdomen: benign Extremities: no edema Wound: incis healing well  Lab Results: No results for input(s): WBC, HGB, HCT, PLT in the last 72 hours. BMET:  Recent Labs  04/07/14 0544 04/08/14 0535  NA 138 139  K 3.5 3.8  CL 104 108  CO2 23 22  GLUCOSE 105* 105*  BUN 11 11  CREATININE 0.68 0.72  CALCIUM 8.0* 8.1*    PT/INR: No results for input(s): LABPROT, INR in the last 72 hours. ABG    Component Value Date/Time   PHART 7.333* 04/01/2014 0003   HCO3 26.6* 04/01/2014 0003   TCO2 24 04/01/2014 1557   ACIDBASEDEF 1.0 03/31/2014 1601   O2SAT 96.0 04/01/2014 0003   CBG (last 3)  No results for input(s): GLUCAP in the last 72 hours.  Meds Scheduled Meds: . amiodarone  200 mg Oral Daily  . antiseptic oral rinse  7 mL Mouth Rinse BID  . aspirin EC  325 mg Oral Daily  . furosemide  40 mg Oral Daily  . hydrALAZINE  100 mg Oral BID  . lisinopril  10 mg Oral Daily  . metoprolol  tartrate  25 mg Oral BID  . metroNIDAZOLE  500 mg Oral 3 times per day  . multivitamin with minerals  1 tablet Oral Daily  . pantoprazole  40 mg Oral Daily  . potassium chloride  40 mEq Oral Daily  . sodium chloride  3 mL Intravenous Q12H  . sodium chloride  3 mL Intravenous Q12H   Continuous Infusions:  PRN Meds:.sodium chloride, hydrALAZINE, HYDROcodone-acetaminophen, metoprolol, ondansetron (ZOFRAN) IV, sodium chloride, sodium chloride, traMADol  Xrays No results found.  Assessment/Plan: S/P Procedure(s) (LRB): BIO-BENTALL PROCEDURE using a 6mm Magna Ease Aortic Tissue Valve, a 26 mm Terumo Gelweave Valsalva Graft, a 14x10x49mm Terumo Gelweave Graft, and a 69mm straight Hemashield Platinum Graft. (N/A) TRANSESOPHAGEAL ECHOCARDIOGRAM (TEE) (N/A)  1 back in afib, will increase amio dose and add mag oxide 2 need to consider AC rx 3 still with diarrhea, cont flagyl, consider vanco in addition 4 increase lisinopril for HTN    LOS: 8 days    GOLD,WAYNE E 04/08/2014  She has been bothered with dry cough on lisinopril, prefers Cartia XT 120 mg Add lanoxin to [employer] for afib Stop lasix- at preop wt Change lopressor to toprol xl 25 daily Not candidate for coumadin due to Bental and total arch replacement- hi risk for pericardial effusion Will d/w daughter about meds and possible DC later today  patient examined and medical record reviewed,agree with  above note. VAN TRIGT III,PETER 04/08/2014

## 2014-04-11 ENCOUNTER — Ambulatory Visit (INDEPENDENT_AMBULATORY_CARE_PROVIDER_SITE_OTHER): Payer: Self-pay | Admitting: Cardiothoracic Surgery

## 2014-04-11 ENCOUNTER — Telehealth: Payer: Self-pay | Admitting: *Deleted

## 2014-04-11 ENCOUNTER — Encounter: Payer: Self-pay | Admitting: Cardiothoracic Surgery

## 2014-04-11 VITALS — BP 131/73 | HR 98 | Resp 20 | Ht 61.0 in | Wt 114.0 lb

## 2014-04-11 DIAGNOSIS — I712 Thoracic aortic aneurysm, without rupture: Secondary | ICD-10-CM

## 2014-04-11 DIAGNOSIS — I7121 Aneurysm of the ascending aorta, without rupture: Secondary | ICD-10-CM

## 2014-04-11 DIAGNOSIS — I351 Nonrheumatic aortic (valve) insufficiency: Secondary | ICD-10-CM

## 2014-04-11 DIAGNOSIS — Z9889 Other specified postprocedural states: Secondary | ICD-10-CM

## 2014-04-11 DIAGNOSIS — Z95828 Presence of other vascular implants and grafts: Secondary | ICD-10-CM

## 2014-04-11 DIAGNOSIS — I7122 Aneurysm of the aortic arch, without rupture: Secondary | ICD-10-CM

## 2014-04-11 DIAGNOSIS — Z952 Presence of prosthetic heart valve: Secondary | ICD-10-CM

## 2014-04-11 DIAGNOSIS — Z954 Presence of other heart-valve replacement: Secondary | ICD-10-CM

## 2014-04-11 NOTE — Telephone Encounter (Signed)
Daughter called with concerns of Yvonne Ballard's irregular pulse.  She is s/p BIO-BENTALL procedure 03/31/14.  I initially referred her to her cardiologist, Dr. Fransico Him.  She said Dr. Prescott Gum had called her yesterday to adjust her meds and felt like he should be the one to take care of this.  She does have an appointment at out office this afternoon.  I asked how her mother was feeling.  She said that she was feeling terrible.  I instructed her to take her to the ER and she agreed.

## 2014-04-11 NOTE — Progress Notes (Signed)
PCP is BLISS, Yvonne Jude, MD Referring Provider is Yvonne Margarita, MD  Chief Complaint  Patient presents with  . Routine Post Op    s/p BENTALL     VCB:SWHQPR visit after a biologic-Bentall procedure with replacement of aortic arch. 74 year old female presented with a 7 cm aneurysm and severe AI with heart failure. She did well after surgery however developed atrial fibrillation. She refused to stay in the hospital any longer so we are managing her her atrial fibrillation as an outpatient. She is progressing and tolerating normal routine activities. She does have some orthostatic dizziness when she stands up. She denies any ankle edema or weight gain. She is not on anticoagulation due to her advanced age and total arch replacement with Dacron branched  Graft  She was sent home initially on amiodarone, digoxin, Toprol-XL, and Cardizem XT. She also has severe hypertension and is on  Apresoline 100 mg twice a day.  The patient's daughter from [place] is currently staying with her.her appetite is good. She denies nausea. She denies chest pain.  Today we removed the skin staples from her right axillary artery site.  Home health nursing is monitoring the patient at home. Her blood pressure measurements are only accurate  in the right arm. Past Medical History  Diagnosis Date  . Hypertension   . Abnormal ECG   . Anxiety disorder   . Weight loss   . Chest pain   . Heart murmur   . Anxiety     pt. reports that she is nervous person  . Headache     use to have a lot headaches, relative to her work, but pt. is retired now.      Past Surgical History  Procedure Laterality Date  . Left and right heart catheterization with coronary angiogram N/A 03/26/2014    Procedure: LEFT AND RIGHT HEART CATHETERIZATION WITH CORONARY ANGIOGRAM;  Surgeon: Peter M Martinique, MD;  Location: [facility] CATH LAB;  Service: Cardiovascular;  Laterality: N/A;  . Tonsillectomy    . Tubal ligation    . Bentall procedure N/A  03/31/2014    Procedure: BIO-BENTALL PROCEDURE using a 52mm Magna Ease Aortic Tissue Valve, a 26 mm Terumo Gelweave Valsalva Graft, a 14x10x64mm Terumo Gelweave Graft, and a 25mm straight Hemashield Platinum Graft.;  Surgeon: Ivin Poot, MD;  Location: [facility];  Service: Open Heart Surgery;  Laterality: N/A;  . Tee without cardioversion N/A 03/31/2014    Procedure: TRANSESOPHAGEAL ECHOCARDIOGRAM (TEE);  Surgeon: Ivin Poot, MD;  Location: [place];  Service: Open Heart Surgery;  Laterality: N/A;    Family History  Problem Relation Age of Onset  . Cancer Mother   . Cancer Father   . Cancer Brother     Social History History  Substance Use Topics  . Smoking status: Never Smoker   . Smokeless tobacco: Not on file  . Alcohol Use: Yes     Comment: " I like wine", - irregular consumption - socially has wine      Current Outpatient Prescriptions  Medication Sig Dispense Refill  . hydrALAZINE (APRESOLINE) 100 MG tablet Take 1 tablet (100 mg total) by mouth 2 (two) times daily. 60 tablet 1  . magnesium oxide (MAG-OX) 400 (241.3 MG) MG tablet Take 1 tablet (400 mg total) by mouth 2 (two) times daily. 10 tablet 0  . metoprolol succinate (TOPROL-XL) 25 MG 24 hr tablet Take 1 tablet (25 mg total) by mouth daily. 30 tablet 1  . metroNIDAZOLE (FLAGYL) 500  MG tablet Take 1 tablet (500 mg total) by mouth every 8 (eight) hours. 30 tablet 0  . Multiple Vitamin (MULTIVITAMIN WITH MINERALS) TABS tablet Take 1 tablet by mouth daily.    Marland Kitchen amiodarone (PACERONE) 400 MG tablet Take 1 tablet (400 mg total) by mouth 2 (two) times daily. Take 400 mg twice a day for 2 days, then 200 mg twice a day (Patient taking differently: Take 400 mg by mouth daily. Take 400 mg twice a day for 2 days, then 200 mg twice a day) 64 tablet 1  . aspirin EC 325 MG EC tablet Take 1 tablet (325 mg total) by mouth daily.    Marland Kitchen CARTIA XT 120 MG 24 hr capsule Take 120 mg by mouth daily.  12  . digoxin (LANOXIN) 0.125 MG tablet Take 1  tablet (0.125 mg total) by mouth daily. (Patient not taking: Reported on 04/11/2014) 30 tablet 1  . HYDROcodone-acetaminophen (NORCO/VICODIN) 5-325 MG per tablet Take 1-2 tablets by mouth every 4 (four) hours as needed for moderate pain or severe pain. (Patient not taking: Reported on 04/11/2014) 50 tablet 0   No current facility-administered medications for this visit.    No Known Allergies  Review of Systems   Appetite is okay, no nausea The patient developed C. Difficile colitis and is finishing a course of oral Flagyl. Her diarrhea is much improved No cough or shortness of breath Surgical incisions healing well  BP 131/73 mmHg  Pulse 98  Resp 20  Ht 5\' 1"  (1.549 m)  Wt 114 lb (51.71 kg)  BMI 21.55 kg/m2  SpO2 96% Physical Exam Alert and comfortable Breath sounds clear Heart rhythm regular-atrial fibrillation 90-100 beats per minute   No murmur of AI Sternum well-healed, Right axillary incision well-healed Abdomen soft nontender No pedal edema Neuro intact  Diagnostic Tests: No chest x-rays today  Impression: Good initial recovery but she has persistent atrial fibrillation. Amiodarone administered intravenously did convert her to sinus rhythm so we will continue with 400 mg twice a day for 3 days and then reduce to 200 mg by mouth twice a day. Her Lanoxin has been discontinued as her heart rate is now better controlled. Blood pressure is adequately controlled.  Plan:continue home health nursing Continue antiarrhythmic therapy as outlined above Hold on oral anticoagulation because of the extremely high risk of pericardial effusion due to recent surgery  Return in 2 weeks for followup to check with chest x-ray

## 2014-04-19 ENCOUNTER — Emergency Department (HOSPITAL_COMMUNITY): Payer: 59

## 2014-04-19 ENCOUNTER — Encounter (HOSPITAL_COMMUNITY): Payer: Self-pay | Admitting: Emergency Medicine

## 2014-04-19 ENCOUNTER — Emergency Department (HOSPITAL_COMMUNITY)
Admission: EM | Admit: 2014-04-19 | Discharge: 2014-04-19 | Disposition: A | Discharge status: Home / Self Care | Payer: 59 | Attending: Emergency Medicine | Admitting: Emergency Medicine

## 2014-04-19 DIAGNOSIS — F419 Anxiety disorder, unspecified: Secondary | ICD-10-CM | POA: Insufficient documentation

## 2014-04-19 DIAGNOSIS — R0602 Shortness of breath: Secondary | ICD-10-CM | POA: Diagnosis present

## 2014-04-19 DIAGNOSIS — I10 Essential (primary) hypertension: Secondary | ICD-10-CM | POA: Diagnosis not present

## 2014-04-19 DIAGNOSIS — Z792 Long term (current) use of antibiotics: Secondary | ICD-10-CM | POA: Insufficient documentation

## 2014-04-19 DIAGNOSIS — E871 Hypo-osmolality and hyponatremia: Secondary | ICD-10-CM | POA: Diagnosis not present

## 2014-04-19 DIAGNOSIS — Z9889 Other specified postprocedural states: Secondary | ICD-10-CM | POA: Insufficient documentation

## 2014-04-19 DIAGNOSIS — R011 Cardiac murmur, unspecified: Secondary | ICD-10-CM | POA: Insufficient documentation

## 2014-04-19 DIAGNOSIS — J90 Pleural effusion, not elsewhere classified: Secondary | ICD-10-CM | POA: Insufficient documentation

## 2014-04-19 DIAGNOSIS — Z79899 Other long term (current) drug therapy: Secondary | ICD-10-CM | POA: Insufficient documentation

## 2014-04-19 DIAGNOSIS — R0600 Dyspnea, unspecified: Secondary | ICD-10-CM | POA: Diagnosis not present

## 2014-04-19 LAB — TROPONIN I
TROPONIN I: 0.06 ng/mL — AB (ref <0.031)
Troponin I: 0.06 ng/mL — ABNORMAL HIGH (ref <0.031)

## 2014-04-19 LAB — URINE MICROSCOPIC-ADD ON

## 2014-04-19 LAB — CBC WITH DIFFERENTIAL/PLATELET
Basophils Absolute: 0 10*3/uL (ref 0.0–0.1)
Basophils Relative: 0 % (ref 0–1)
EOS ABS: 0 10*3/uL (ref 0.0–0.7)
Eosinophils Relative: 0 % (ref 0–5)
HCT: 34.4 % — ABNORMAL LOW (ref 36.0–46.0)
Hemoglobin: 12 g/dL (ref 12.0–15.0)
Lymphocytes Relative: 9 % — ABNORMAL LOW (ref 12–46)
Lymphs Abs: 0.6 10*3/uL — ABNORMAL LOW (ref 0.7–4.0)
MCH: 30.1 pg (ref 26.0–34.0)
MCHC: 34.9 g/dL (ref 30.0–36.0)
MCV: 86.2 fL (ref 78.0–100.0)
MONOS PCT: 9 % (ref 3–12)
Monocytes Absolute: 0.7 10*3/uL (ref 0.1–1.0)
Neutro Abs: 6 10*3/uL (ref 1.7–7.7)
Neutrophils Relative %: 82 % — ABNORMAL HIGH (ref 43–77)
Platelets: 304 10*3/uL (ref 150–400)
RBC: 3.99 MIL/uL (ref 3.87–5.11)
RDW: 14.5 % (ref 11.5–15.5)
WBC: 7.3 10*3/uL (ref 4.0–10.5)

## 2014-04-19 LAB — URINALYSIS, ROUTINE W REFLEX MICROSCOPIC
Bilirubin Urine: NEGATIVE
Glucose, UA: NEGATIVE mg/dL
HGB URINE DIPSTICK: NEGATIVE
Ketones, ur: NEGATIVE mg/dL
NITRITE: NEGATIVE
Protein, ur: NEGATIVE mg/dL
SPECIFIC GRAVITY, URINE: 1.011 (ref 1.005–1.030)
UROBILINOGEN UA: 0.2 mg/dL (ref 0.0–1.0)
pH: 6.5 (ref 5.0–8.0)

## 2014-04-19 LAB — I-STAT CG4 LACTIC ACID, ED
Lactic Acid, Venous: 0.45 mmol/L — ABNORMAL LOW (ref 0.5–2.0)
Lactic Acid, Venous: 0.45 mmol/L — ABNORMAL LOW (ref 0.5–2.0)

## 2014-04-19 LAB — I-STAT CHEM 8, ED
BUN: 13 mg/dL (ref 6–23)
CREATININE: 0.7 mg/dL (ref 0.50–1.10)
Calcium, Ion: 1.1 mmol/L — ABNORMAL LOW (ref 1.13–1.30)
Chloride: 90 mmol/L — ABNORMAL LOW (ref 96–112)
Glucose, Bld: 103 mg/dL — ABNORMAL HIGH (ref 70–99)
HCT: 38 % (ref 36.0–46.0)
HEMOGLOBIN: 12.9 g/dL (ref 12.0–15.0)
Potassium: 3.7 mmol/L (ref 3.5–5.1)
Sodium: 125 mmol/L — ABNORMAL LOW (ref 135–145)
TCO2: 23 mmol/L (ref 0–100)

## 2014-04-19 LAB — PROTIME-INR
INR: 1.05 (ref 0.00–1.49)
Prothrombin Time: 13.8 seconds (ref 11.6–15.2)

## 2014-04-19 LAB — BASIC METABOLIC PANEL
Anion gap: 7 (ref 5–15)
BUN: 10 mg/dL (ref 6–23)
CALCIUM: 7.9 mg/dL — AB (ref 8.4–10.5)
CHLORIDE: 92 mmol/L — AB (ref 96–112)
CO2: 27 mmol/L (ref 19–32)
CREATININE: 0.6 mg/dL (ref 0.50–1.10)
GFR calc Af Amer: >90 mL/min (ref >90)
GFR, EST NON AFRICAN AMERICAN: 87 mL/min — AB (ref >90)
GLUCOSE: 107 mg/dL — AB (ref 70–99)
Potassium: 3.6 mmol/L (ref 3.5–5.1)
Sodium: 126 mmol/L — ABNORMAL LOW (ref 135–145)

## 2014-04-19 LAB — BRAIN NATRIURETIC PEPTIDE: B Natriuretic Peptide: 560.9 pg/mL — ABNORMAL HIGH (ref 0.0–100.0)

## 2014-04-19 LAB — DIGOXIN LEVEL: Digoxin Level: <0.2 ng/mL — ABNORMAL LOW (ref 0.8–2.0)

## 2014-04-19 LAB — I-STAT TROPONIN, ED: TROPONIN I, POC: 0.05 ng/mL (ref 0.00–0.08)

## 2014-04-19 MED ORDER — LIDOCAINE HCL (PF) 1 % IJ SOLN
INTRAMUSCULAR | Status: AC
  Filled 2014-04-19: qty 10

## 2014-04-19 NOTE — ED Notes (Signed)
Pt sleeping on R side, NAD, calm, VSS, NSR on monitor.

## 2014-04-19 NOTE — Consult Note (Signed)
Reason for Consult:Right pleural effusion Referring Physician: Dr. Erlene Senters Yvonne Ballard is an 74 y.o. female.  HPI: 74 yo woman who recently had a Bentall procedure presented overnight with a cc/o shortness of breath.  Yvonne Ballard is a 74 yo woman who had a Bio-Bentall procedure by Dr. Prescott Gum on 1/18. Postoperative course was complicated by atrial fibrillation. She converted to SR. She was not anticoagulated. Yesterday evening she felt a little light headed and short of breath. She was awakened from sleep at 0200 with shortness of breath. She waited awhile but it did not improve and she finally came to the ED  A chest x-ray showed a large right pleural effusion. She had a thoracentesis by IR draining 1.9 liters of serosanguinous fluid. She tolerated the procedure well and feels better afterwards.  Past Medical History  Diagnosis Date  . Hypertension   . Abnormal ECG   . Anxiety disorder   . Weight loss   . Chest pain   . Heart murmur   . Anxiety     pt. reports that she is nervous person  . Headache     use to have a lot headaches, relative to her work, but pt. is retired now.      Past Surgical History  Procedure Laterality Date  . Left and right heart catheterization with coronary angiogram N/A 03/26/2014    Procedure: LEFT AND RIGHT HEART CATHETERIZATION WITH CORONARY ANGIOGRAM;  Surgeon: Peter M Martinique, MD;  Location: [facility] CATH LAB;  Service: Cardiovascular;  Laterality: N/A;  . Tonsillectomy    . Tubal ligation    . Bentall procedure N/A 03/31/2014    Procedure: BIO-BENTALL PROCEDURE using a 69m Magna Ease Aortic Tissue Valve, a 26 mm Terumo Gelweave Valsalva Graft, a 14x10x159mTerumo Gelweave Graft, and a 2837mtraight Hemashield Platinum Graft.;  Surgeon: PetIvin PootD;  [facility];  Laterality: N/A;  . Tee without cardioversion N/A 03/31/2014    Procedure: TRANSESOPHAGEAL ECHOCARDIOGRAM (TEE);  Surgeon: PetIvin PootD;  [facility];  Laterality: N/A;    Family History  Problem Relation Age of Onset  . Cancer Mother   . Cancer Father   . Cancer Brother     Social History:  reports that she has never smoked. She does not have any smokeless tobacco history on file. She reports that she drinks alcohol. She reports that she does not use illicit drugs.  Allergies: No Known Allergies  Medications: Prior to Admission:  (Not in a hospital admission)  Results for orders placed or performed during the hospital encounter of 04/19/14 (from the past 48 hour(s))  Brain natriuretic peptide     Status: Abnormal   Collection Time: 04/19/14  4:17 AM  Result Value Ref Range   B Natriuretic Peptide 560.9 (H) 0.0 - 100.0 pg/mL  Basic metabolic panel     Status: Abnormal   Collection Time: 04/19/14  4:17 AM  Result Value Ref Range   Sodium 126 (L) 135 - 145 mmol/L   Potassium 3.6 3.5 - 5.1 mmol/L   Chloride 92 (L) 96 - 112 mmol/L   CO2 27 19 - 32 mmol/L   Glucose, Bld 107 (H) 70 - 99 mg/dL   BUN 10 6 - 23 mg/dL   Creatinine, Ser 0.60 0.50 - 1.10 mg/dL   Calcium 7.9 (L) 8.4 - 10.5 mg/dL   GFR calc non Af Amer 87 (L) >90 mL/min  GFR calc Af Amer >90 >90 mL/min    Comment: (NOTE) The eGFR has been calculated using the CKD EPI equation. This calculation has not been validated in all clinical situations. eGFR's persistently <90 mL/min signify possible Chronic Kidney Disease.    Anion gap 7 5 - 15  Troponin I     Status: Abnormal   Collection Time: 04/19/14  4:17 AM  Result Value Ref Range   Troponin I 0.06 (H) <0.031 ng/mL    Comment:        PERSISTENTLY INCREASED TROPONIN VALUES IN THE RANGE OF 0.04-0.49 ng/mL CAN BE SEEN IN:       -UNSTABLE ANGINA       -CONGESTIVE HEART FAILURE       -MYOCARDITIS       -CHEST TRAUMA       -ARRYHTHMIAS       -LATE PRESENTING MYOCARDIAL INFARCTION       -COPD   CLINICAL FOLLOW-UP RECOMMENDED.   CBC with Differential     Status: Abnormal    Collection Time: 04/19/14  4:17 AM  Result Value Ref Range   WBC 7.3 4.0 - 10.5 K/uL   RBC 3.99 3.87 - 5.11 MIL/uL   Hemoglobin 12.0 12.0 - 15.0 g/dL   HCT 34.4 (L) 36.0 - 46.0 %   MCV 86.2 78.0 - 100.0 fL   MCH 30.1 26.0 - 34.0 pg   MCHC 34.9 30.0 - 36.0 g/dL   RDW 14.5 11.5 - 15.5 %   Platelets 304 150 - 400 K/uL   Neutrophils Relative % 82 (H) 43 - 77 %   Neutro Abs 6.0 1.7 - 7.7 K/uL   Lymphocytes Relative 9 (L) 12 - 46 %   Lymphs Abs 0.6 (L) 0.7 - 4.0 K/uL   Monocytes Relative 9 3 - 12 %   Monocytes Absolute 0.7 0.1 - 1.0 K/uL   Eosinophils Relative 0 0 - 5 %   Eosinophils Absolute 0.0 0.0 - 0.7 K/uL   Basophils Relative 0 0 - 1 %   Basophils Absolute 0.0 0.0 - 0.1 K/uL  Digoxin level     Status: Abnormal   Collection Time: 04/19/14  4:17 AM  Result Value Ref Range   Digoxin Level <0.2 (L) 0.8 - 2.0 ng/mL    Comment: RESULTS CONFIRMED BY MANUAL DILUTION  I-Stat Troponin, ED (not at [facility])     Status: None   Collection Time: 04/19/14  4:36 AM  Result Value Ref Range   Troponin i, poc 0.05 0.00 - 0.08 ng/mL   Comment 3            Comment: Due to the release kinetics of cTnI, a negative result within the first hours of the onset of symptoms does not rule out myocardial infarction with certainty. If myocardial infarction is still suspected, repeat the test at appropriate intervals.   I-Stat Chem 8, ED     Status: Abnormal   Collection Time: 04/19/14  4:38 AM  Result Value Ref Range   Sodium 125 (L) 135 - 145 mmol/L   Potassium 3.7 3.5 - 5.1 mmol/L   Chloride 90 (L) 96 - 112 mmol/L   BUN 13 6 - 23 mg/dL   Creatinine, Ser 0.70 0.50 - 1.10 mg/dL   Glucose, Bld 103 (H) 70 - 99 mg/dL   Calcium, Ion 1.10 (L) 1.13 - 1.30 mmol/L   TCO2 23 0 - 100 mmol/L   Hemoglobin 12.9 12.0 - 15.0 g/dL   HCT 38.0 36.0 -  46.0 %  I-Stat CG4 Lactic Acid, ED     Status: Abnormal   Collection Time: 04/19/14  4:39 AM  Result Value Ref Range   Lactic Acid, Venous 0.45 (L) 0.5 - 2.0 mmol/L   Urinalysis, Routine w reflex microscopic     Status: Abnormal   Collection Time: 04/19/14  6:10 AM  Result Value Ref Range   Color, Urine YELLOW YELLOW   APPearance CLEAR CLEAR   Specific Gravity, Urine 1.011 1.005 - 1.030   pH 6.5 5.0 - 8.0   Glucose, UA NEGATIVE NEGATIVE mg/dL   Hgb urine dipstick NEGATIVE NEGATIVE   Bilirubin Urine NEGATIVE NEGATIVE   Ketones, ur NEGATIVE NEGATIVE mg/dL   Protein, ur NEGATIVE NEGATIVE mg/dL   Urobilinogen, UA 0.2 0.0 - 1.0 mg/dL   Nitrite NEGATIVE NEGATIVE   Leukocytes, UA SMALL (A) NEGATIVE  Urine microscopic-add on     Status: Abnormal   Collection Time: 04/19/14  6:10 AM  Result Value Ref Range   Squamous Epithelial / LPF FEW (A) RARE   WBC, UA 3-6 <3 WBC/hpf   Bacteria, UA RARE RARE    Dg Chest 1 View  04/19/2014   CLINICAL DATA:  Post right-sided thoracentesis. History of hypertension and heart murmur.  EXAM: CHEST  1 VIEW  COMPARISON:  Earlier same day; 04/04/2014  FINDINGS: Grossly unchanged enlarged cardiac silhouette and mediastinal contours with atherosclerotic plaque within the thoracic aorta. Post median sternotomy aortic valve replacement.  Interval reduction / near resolution of persistent trace right-sided effusion post thoracentesis. No pneumothorax. Unchanged trace left-sided effusion. Improved aeration of the lungs with persistent bibasilar opacities, right greater than left, likely atelectasis. No new focal airspace opacities. No evidence of edema. Unchanged bones. Surgical clips overlie the upper outer quadrant of the right breast.  IMPRESSION: 1. Interval reduction/near resolution of persistent trace right-sided effusion post thoracentesis. No pneumothorax. 2. Improved aeration of lungs with persistent bibasilar opacities, right greater than left, likely atelectasis or scar. No evidence of edema.   Electronically Signed   By: Sandi Mariscal M.D.   On: 04/19/2014 08:52   Dg Chest 2 View  04/19/2014   CLINICAL DATA:  Shortness of breath.  Patient report shortness of breath since aneurysm surgery January 17th.  EXAM: CHEST  2 VIEW  COMPARISON:  04/04/2014  FINDINGS: Increased right pleural effusion, now occupying the lower 1/2 of right hemithorax. Diminished left pleural effusion. Patient is post median sternotomy with prosthetic aortic valve. Suspect cardiomegaly, grossly stable, partially obscured by pleural effusions. There is no pneumothorax. Surgical clips project over the right lateral hemithorax. The previous skin staples have been removed. No acute osseous abnormalities are seen.  IMPRESSION: 1. Increase in right pleural effusion, now occupying the lower 1/2 of right hemithorax. 2. Decrease in left pleural effusion.   Electronically Signed   By: Jeb Levering M.D.   On: 04/19/2014 05:31    Review of Systems  Constitutional: Positive for malaise/fatigue.  Respiratory: Positive for shortness of breath.   Gastrointestinal: Positive for diarrhea (loose stools).  Neurological: Positive for dizziness and weakness.   Blood pressure 130/60, pulse 72, temperature 98.2 F (36.8 C), temperature source Oral, resp. rate 24, height 5' (1.524 m), weight 112 lb (50.803 kg), SpO2 98 %. Physical Exam  Vitals reviewed. Constitutional: She is oriented to person, place, and time. No distress.  thin  HENT:  Head: Normocephalic and atraumatic.  Eyes: EOM are normal.  Neck: No tracheal deviation present.  Cardiovascular: Normal rate, regular rhythm and  normal heart sounds.   Respiratory: She has no wheezes.  BS mildly diminished in right base  GI: Soft. There is no tenderness.  Musculoskeletal: She exhibits no edema.  Lymphadenopathy:    She has no cervical adenopathy.  Neurological: She is alert and oriented to person, place, and time. No cranial nerve deficit.  Skin: Skin is warm and dry.    Assessment/Plan: 74 yo woman who presented with a large right pleural effusion 3 weeks after having a Bentall procedure. She had thoracentesis  draining almost 2 liters of fluid and her CXR shows near complete resolution of the effusion.   Her lab work was remarkable for a BNP of 560 and a troponin of 0.06. She does not have any history or findings to suggest myocardial ischemia. I suspect the troponin is a manifestation of her CHF +/- recent surgery. Her sodium was low at 125. She says she has been drinking a lot of water because she was told to do so. I advised her to drink water as needed when she is thirsty but not to consume extra.  At this point with her effusion drained her shortness of breath is dramatically improved. I think she can probably go home later today. Will check her sat off O2 and see how well she can get up and around. If she is able to get around OK will let her go home and follow up with Dr. Prescott Gum at her scheduled appointment  [place] C 04/19/2014, 9:04 AM

## 2014-04-19 NOTE — ED Notes (Signed)
Dr Sharol Given given a copy of lactic acid results .[place]

## 2014-04-19 NOTE — ED Notes (Signed)
Phlebotomy at bedside.

## 2014-04-19 NOTE — Discharge Instructions (Signed)
Pleural Effusion The lining covering your lungs and the inside of your chest is called the pleura. Usually, the space between the two pleura contains no air and only a thin layer of fluid. A pleural effusion is an abnormal buildup of fluid in the pleural space. Fluid gathers when there is increased pressure in the lung vessels. This forces fluids out of the lungs and into the pleural space. Vessels may also leak fluids when there are infections, such as pneumonia, or other causes of soreness and redness (inflammation). Fluids leak into the lungs when protein in the blood is low or when certain vessels (lymphatics) are blocked. Finding a pleural effusion is important because it is usually caused by another disease. In order to treat a pleural effusion, your health care provider needs to find its cause. If left untreated, a large amount of fluid can build up and cause collapse of the lung. CAUSES   Heart failure.  Infections (pneumonia, tuberculosis), pulmonary embolism, pulmonary infarction.  Cancer (primary lung and metastatic), asbestosis.  Liver failure (cirrhosis).  Nephrotic syndrome, peritoneal dialysis, kidney problems (uremia).  Collagen vascular disease (systemic lupus erythematosus, rheumatoid arthritis).  Injury (trauma) to the chest or rupture of the digestive tube (esophagus).  Material in the chest or pleural space (hemothorax, chylothorax).  Pancreatitis.  Surgery.  Drug reactions. SYMPTOMS  A pleural effusion can decrease the amount of space available for breathing and make you short of breath. The fluid can become infected, which may cause pain and fever. Often, the pain is worse when taking a deep breath. The underlying disease (heart failure, pneumonia, blood clot, tuberculosis, cancer) may also cause symptoms. DIAGNOSIS   Your health care provider can usually tell what is wrong by talking to you (taking a history), doing an exam, and taking a routine X-ray. If the  X-ray shows fluid in your chest, often fluid is removed from your chest with a needle for testing (diagnostic thoracentesis).  Sometimes, more specialized X-rays may be needed.  Sometimes, a small piece of tissue is removed and examined by a specialist (biopsy). TREATMENT  Treatment varies based on what caused the pleural effusion. Treatments include:  Removing as much fluid as possible using a needle (thoracentesis) to improve the cough and shortness of breath. This is a simple procedure that can be done at bedside. The risks are bleeding, infection, collapse of a lung, or low blood pressure.  Placing a tube in the chest to drain the effusion (tube thoracostomy). This is often used when there is an infection in the fluid. This is a simple procedure that can often be done at bedside or in a clinic. The procedure may be painful. The risks are the same as using a needle to drain the fluid. The chest tube usually remains for a few days and is connected to suction to improve fluid drainage. After placement, the tube usually does not cause much discomfort.  Surgical removal of fibrous debris in and around the pleural space (decortication). This may be done with a flexible telescope (thoracoscope) through a small or large cut (incision). This is helpful for patients who have fibrosis or scar tissue that prevents complete lung expansion. The risks are infection, blood loss, and side effects from general anesthesia.  Sometimes, a procedure called pleurodesis is done. A chest tube is placed and the fluid is drained. Next, an agent (tetracycline, talc powder) is added to the pleural space. This causes the lung and chest wall to stick together (adhesion). This leaves no  potential space for fluid to build up. The risks include infection, blood loss, and side effects from general anesthesia.  If the effusion is caused by infection, it may be treated with antibiotics and may improve without draining. HOME CARE  INSTRUCTIONS   Take any medicines exactly as prescribed.  Follow up with your health care provider as directed.  Monitor your exercise capacity (the amount of walking you can do before you get short of breath).  Do not use any tobacco products including cigarettes, chewing tobacco, or electronic cigarettes. SEEK MEDICAL CARE IF:   Your exercise capacity seems to get worse or does not improve with time.  You do not recover from your illness.  You have drainage, redness, swelling, or pain at any incision or puncture sites. SEEK IMMEDIATE MEDICAL CARE IF:   Shortness of breath or chest pain develops or gets worse.  You have a fever.  You develop a new cough, especially if the mucus (phlegm) is discolored. MAKE SURE YOU:   Understand these instructions.  Will watch your condition.  Will get help right away if you are not doing well or get worse. Document Released: 02/28/2005 Document Revised: 07/15/2013 Document Reviewed: 10/20/2006 [facility] Patient Information 2015 [place], [place]. This information is not intended to replace advice given to you by your health care provider. Make sure you discuss any questions you have with your health care provider.  Hyponatremia  Hyponatremia is when the amount of salt (sodium) in your blood is too low. When sodium levels are low, your cells will absorb extra water and swell. The swelling happens throughout the body, but it mostly affects the brain. Severe brain swelling (cerebral edema), seizures, or coma can happen.  CAUSES   Heart, kidney, or liver problems.  Thyroid problems.  Adrenal gland problems.  Severe vomiting and diarrhea.  Certain medicines or illegal drugs.  Dehydration.  Drinking too much water.  Low-sodium diet. SYMPTOMS   Nausea and vomiting.  Confusion.  Lethargy.  Agitation.  Headache.  Twitching or shaking (seizures).  Unconsciousness.  Appetite loss.  Muscle weakness and cramping. DIAGNOSIS    Hyponatremia is identified by a simple blood test. Your caregiver will perform a history and physical exam to try to find the cause and type of hyponatremia. Other tests may be needed to measure the amount of sodium in your blood and urine. TREATMENT  Treatment will depend on the cause.   Fluids may be given through the vein (IV).  Medicines may be used to correct the sodium imbalance. If medicines are causing the problem, they will need to be adjusted.  Water or fluid intake may be restricted to restore proper balance. The speed of correcting the sodium problem is very important. If the problem is corrected too fast, nerve damage (sometimes unchangeable) can happen. HOME CARE INSTRUCTIONS   Only take medicines as directed by your caregiver. Many medicines can make hyponatremia worse. Discuss all your medicines with your caregiver.  Carefully follow any recommended diet, including any fluid restrictions.  You may be asked to repeat lab tests. Follow these directions.  Avoid alcohol and recreational drugs. SEEK MEDICAL CARE IF:   You develop worsening nausea, fatigue, headache, confusion, or weakness.  Your original hyponatremia symptoms return.  You have problems following the recommended diet. SEEK IMMEDIATE MEDICAL CARE IF:   You have a seizure.  You faint.  You have ongoing diarrhea or vomiting. MAKE SURE YOU:   Understand these instructions.  Will watch your condition.  Will get  help right away if you are not doing well or get worse. Document Released: 02/18/2002 Document Revised: 05/23/2011 Document Reviewed: 08/15/2010 [facility] Patient Information 2015 [place], [place]. This information is not intended to replace advice given to you by your health care provider. Make sure you discuss any questions you have with your health care provider.

## 2014-04-19 NOTE — Procedures (Signed)
Successful US guided right thoracentesis. Yielded 1.9 liters of blood tinged fluid. Pt tolerated procedure well. No immediate complications.  Specimen was not sent for labs. CXR ordered.  Tsosie Billing D PA-C 04/19/2014 8:54 AM

## 2014-04-19 NOTE — ED Provider Notes (Signed)
CSN: [id number]     Arrival date & time 04/19/14  0352 History   First MD Initiated Contact with Patient 04/19/14 0401     No chief complaint on file.    (Consider location/radiation/quality/duration/timing/severity/associated sxs/prior Treatment) HPI 74 year old female presents to the emergency department from home via EMS with complaint of worsening shortness of breath.  Patient reports over the last few days she has gotten more short of breath.  Tonight she felt better after eating, went to bed.  She woke at 2 AM, and felt like she was smothering.  She sat on the side of the bed for some time trying to catch her breath.  Patient also reports that she has not been urinating as well, as she had been.  She reports that she is drinking water constantly, but is urinating only small amounts.  Her last urination was about 6 PM.  She denies any fever or chills.  No cough.  Shortness of breath is slightly worse with laying flat.  She denies any chest pain.  She denies any swelling in her legs.  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  This is complicated with atrial fibrillation postop.  She is not on anticoagulation.  She has been on amiodarone.  She is not currently in A. fib.  She is no longer taking digoxin. Past Medical History  Diagnosis Date  . Hypertension   . Abnormal ECG   . Anxiety disorder   . Weight loss   . Chest pain   . Heart murmur   . Anxiety     pt. reports that she is nervous person  . Headache     use to have a lot headaches, relative to her work, but pt. is retired now.     Past Surgical History  Procedure Laterality Date  . Left and right heart catheterization with coronary angiogram N/A 03/26/2014    Procedure: LEFT AND RIGHT HEART CATHETERIZATION WITH CORONARY ANGIOGRAM;  Surgeon: Peter M Martinique, MD;  Location: [facility] CATH LAB;  Service: Cardiovascular;  Laterality: N/A;  . Tonsillectomy    . Tubal ligation    .  Bentall procedure N/A 03/31/2014    Procedure: BIO-BENTALL PROCEDURE using a 45mm Magna Ease Aortic Tissue Valve, a 26 mm Terumo Gelweave Valsalva Graft, a 14x10x56mm Terumo Gelweave Graft, and a 69mm straight Hemashield Platinum Graft.;  Surgeon: Ivin Poot, MD;  Location: [facility];  Service: Open Heart Surgery;  Laterality: N/A;  . Tee without cardioversion N/A 03/31/2014    Procedure: TRANSESOPHAGEAL ECHOCARDIOGRAM (TEE);  Surgeon: Ivin Poot, MD;  Location: [facility];  Service: Open Heart Surgery;  Laterality: N/A;   Family History  Problem Relation Age of Onset  . Cancer Mother   . Cancer Father   . Cancer Brother    History  Substance Use Topics  . Smoking status: Never Smoker   . Smokeless tobacco: Not on file  . Alcohol Use: Yes     Comment: " I like wine", - irregular consumption - socially has wine     OB History    No data available     Review of Systems  See History of Present Illness; otherwise all other systems are reviewed and negative   Allergies  Review of patient's allergies indicates no known allergies.  Home Medications   Prior to Admission medications   Medication Sig Start Date End Date Taking? Authorizing Inman Fettig  amiodarone (PACERONE) 400 MG tablet Take  1 tablet (400 mg total) by mouth 2 (two) times daily. Take 400 mg twice a day for 2 days, then 200 mg twice a day Patient taking differently: Take 400 mg by mouth daily. Take 400 mg twice a day for 2 days, then 200 mg twice a day 04/08/14   [name], PA-C  aspirin EC 325 MG EC tablet Take 1 tablet (325 mg total) by mouth daily. 04/08/14   Wayne E Gold, PA-C  CARTIA XT 120 MG 24 hr capsule Take 120 mg by mouth daily. 01/22/14   Historical Alroy Portela, MD  digoxin (LANOXIN) 0.125 MG tablet Take 1 tablet (0.125 mg total) by mouth daily. Patient not taking: Reported on 04/11/2014 04/09/14   [name], PA-C  hydrALAZINE (APRESOLINE) 100 MG tablet Take 1 tablet (100 mg total) by mouth 2 (two) times daily.  04/08/14   [name], PA-C  HYDROcodone-acetaminophen (NORCO/VICODIN) 5-325 MG per tablet Take 1-2 tablets by mouth every 4 (four) hours as needed for moderate pain or severe pain. Patient not taking: Reported on 04/11/2014 04/08/14   [name], PA-C  magnesium oxide (MAG-OX) 400 (241.3 MG) MG tablet Take 1 tablet (400 mg total) by mouth 2 (two) times daily. 04/08/14   Wayne E Gold, PA-C  metoprolol succinate (TOPROL-XL) 25 MG 24 hr tablet Take 1 tablet (25 mg total) by mouth daily. 04/09/14   Wayne E Gold, PA-C  metroNIDAZOLE (FLAGYL) 500 MG tablet Take 1 tablet (500 mg total) by mouth every 8 (eight) hours. 04/08/14   [name], PA-C  Multiple Vitamin (MULTIVITAMIN WITH MINERALS) TABS tablet Take 1 tablet by mouth daily. 04/08/14   Wayne E Gold, PA-C   There were no vitals taken for this visit. Physical Exam  Constitutional: She is oriented to person, place, and time. She appears well-developed and well-nourished.  HENT:  Head: Normocephalic and atraumatic.  Right Ear: External ear normal.  Left Ear: External ear normal.  Nose: Nose normal.  Dry mucous membranes  Eyes: Conjunctivae and EOM are normal. Pupils are equal, round, and reactive to light.  Neck: Normal range of motion. Neck supple. No JVD present. No tracheal deviation present. No thyromegaly present.  Cardiovascular: Normal rate, regular rhythm and intact distal pulses.  Exam reveals no gallop and no friction rub.   Murmur heard. Pulmonary/Chest: Effort normal. No stridor. No respiratory distress. She has no wheezes. She has no rales. She exhibits no tenderness.  Patient has diminished breath sounds in the bases, worse on the right versus the left  Abdominal: Soft. Bowel sounds are normal. She exhibits no distension and no mass. There is no tenderness. There is no rebound and no guarding.  Musculoskeletal: Normal range of motion. She exhibits no edema or tenderness.  Lymphadenopathy:    She has no cervical adenopathy.   Neurological: She is alert and oriented to person, place, and time. She displays normal reflexes. She exhibits normal muscle tone. Coordination normal.  Skin: Skin is warm and dry. No rash noted. No erythema. No pallor.  Psychiatric: She has a normal mood and affect. Her behavior is normal. Judgment and thought content normal.    ED Course  Procedures (including critical care time) Labs Review Labs Reviewed  BRAIN NATRIURETIC PEPTIDE - Abnormal; Notable for the following:    B Natriuretic Peptide 560.9 (*)    All other components within normal limits  BASIC METABOLIC PANEL - Abnormal; Notable for the following:    Sodium 126 (*)  Chloride 92 (*)    Glucose, Bld 107 (*)    Calcium 7.9 (*)    GFR calc non Af Amer 87 (*)    All other components within normal limits  TROPONIN I - Abnormal; Notable for the following:    Troponin I 0.06 (*)    All other components within normal limits  CBC WITH DIFFERENTIAL/PLATELET - Abnormal; Notable for the following:    HCT 34.4 (*)    Neutrophils Relative % 82 (*)    Lymphocytes Relative 9 (*)    Lymphs Abs 0.6 (*)    All other components within normal limits  DIGOXIN LEVEL - Abnormal; Notable for the following:    Digoxin Level <0.2 (*)    All other components within normal limits  I-STAT CHEM 8, ED - Abnormal; Notable for the following:    Sodium 125 (*)    Chloride 90 (*)    Glucose, Bld 103 (*)    Calcium, Ion 1.10 (*)    All other components within normal limits  I-STAT CG4 LACTIC ACID, ED - Abnormal; Notable for the following:    Lactic Acid, Venous 0.45 (*)    All other components within normal limits  URINE CULTURE  URINALYSIS, ROUTINE W REFLEX MICROSCOPIC  I-STAT TROPOININ, ED    Imaging Review Dg Chest 2 View  04/19/2014   CLINICAL DATA:  Shortness of breath. Patient report shortness of breath since aneurysm surgery January 17th.  EXAM: CHEST  2 VIEW  COMPARISON:  04/04/2014  FINDINGS: Increased right pleural effusion, now  occupying the lower 1/2 of right hemithorax. Diminished left pleural effusion. Patient is post median sternotomy with prosthetic aortic valve. Suspect cardiomegaly, grossly stable, partially obscured by pleural effusions. There is no pneumothorax. Surgical clips project over the right lateral hemithorax. The previous skin staples have been removed. No acute osseous abnormalities are seen.  IMPRESSION: 1. Increase in right pleural effusion, now occupying the lower 1/2 of right hemithorax. 2. Decrease in left pleural effusion.   Electronically Signed   By: Jeb Levering M.D.   On: 04/19/2014 05:31     EKG Interpretation   Date/Time:  Saturday April 19 2014 04:09:49 EST Ventricular Rate:  67 PR Interval:    QRS Duration: 149 QT Interval:  455 QTC Calculation: 480 R Axis:   97 Text Interpretation:  Sinus rhythm RBBB and LPFB Consider left ventricular  hypertrophy Confirmed by OTTER  MD, OLGA ([id number]) on 04/19/2014 4:46:47 AM      MDM   Final diagnoses:  SOB (shortness of breath)  Dyspnea  Hyponatremia  Pleural effusion, right    74 year old female status post Bentall procedure with aortic valve and arch repair.  Worsening shortness of breath over the last few days.  Patient noted be hyponatremic with worsening right pleural effusion.  Will consult CT surgery for further recommendations.  6:55 AM Case d/w Dr Roxan Hockey who requests consult to radiology for thorocentesis.  He will see the patient in the ED.    Kalman Drape, MD 04/19/14 [phone]

## 2014-04-19 NOTE — ED Notes (Signed)
Pt arrives via EMS with c/o [facility] that woke her from sleep around 0200 this morning. AAA and aortic valve replaced on 1/17th, states she's been intermittently SHOB since. PCP aware, however has not given her new orders regarding SHOB. Worsened yesterday but she was able to go to sleep. Diminished lung sounds on R side, otherwise clear. Nonproductive cough. States [facility] is relieved by oxygen. Now on 4L [place]. Also c/o mild chest pressure.

## 2014-04-19 NOTE — ED Provider Notes (Signed)
Patient has ambulated while maintaining O2 sats is feeling improved after 1.9 L of fluid drawn off on thoracentesis.  She is feeling improved.  Dr. Roxan Hockey with CT surgery has seen her in the emergency department.  She has an outpatient appointment with Dr. Lucianne Lei Treitz scheduled for next week.  I will have her follow-up with her PCP, Dr. Clemmie Krill is coming week for repeat sodium.  Repeat troponin is unchanged.  Dr. Roxan Hockey felt that elevated, troponin was likely residual from her recent CT surgery.  Patient discharged home with strict return precautions for new or worsening symptoms including return or shortness of breath or worsening shortness of breath, chest pain, fevers.  1. Hyponatremia   2. SOB (shortness of breath)   3. Dyspnea   4. Pleural effusion, right   5. Pleural effusion   6. S/P thoracentesis      Ernestina Patches, MD 04/19/14 1055

## 2014-04-19 NOTE — ED Notes (Signed)
   Pt pulse ox between 94%-96% while ambulating, Pt c/o dizziness

## 2014-04-19 NOTE — ED Notes (Signed)
Pt to xray

## 2014-04-19 NOTE — ED Notes (Signed)
Family at bedside. 

## 2014-04-19 NOTE — ED Notes (Signed)
Pt returned from room

## 2014-04-19 NOTE — ED Notes (Signed)
Pt not in room, at Thoracentesis.

## 2014-04-21 LAB — URINE CULTURE: Colony Count: 30000

## 2014-04-25 ENCOUNTER — Other Ambulatory Visit: Payer: Self-pay | Admitting: Cardiothoracic Surgery

## 2014-04-25 DIAGNOSIS — I712 Thoracic aortic aneurysm, without rupture, unspecified: Secondary | ICD-10-CM

## 2014-04-30 ENCOUNTER — Ambulatory Visit
Admission: RE | Admit: 2014-04-30 | Discharge: 2014-04-30 | Disposition: A | Discharge status: Home / Self Care | Payer: 59 | Source: Ambulatory Visit | Attending: Cardiothoracic Surgery | Admitting: Cardiothoracic Surgery

## 2014-04-30 ENCOUNTER — Ambulatory Visit (INDEPENDENT_AMBULATORY_CARE_PROVIDER_SITE_OTHER): Payer: Self-pay | Admitting: Cardiothoracic Surgery

## 2014-04-30 ENCOUNTER — Encounter: Payer: Self-pay | Admitting: Cardiothoracic Surgery

## 2014-04-30 VITALS — BP 130/62 | HR 60 | Resp 20 | Ht 60.0 in | Wt 112.0 lb

## 2014-04-30 DIAGNOSIS — Z9889 Other specified postprocedural states: Secondary | ICD-10-CM

## 2014-04-30 DIAGNOSIS — Z48812 Encounter for surgical aftercare following surgery on the circulatory system: Secondary | ICD-10-CM

## 2014-04-30 DIAGNOSIS — I712 Thoracic aortic aneurysm, without rupture, unspecified: Secondary | ICD-10-CM

## 2014-04-30 DIAGNOSIS — Z952 Presence of prosthetic heart valve: Secondary | ICD-10-CM

## 2014-04-30 DIAGNOSIS — Z95828 Presence of other vascular implants and grafts: Secondary | ICD-10-CM

## 2014-04-30 DIAGNOSIS — I7121 Aneurysm of the ascending aorta, without rupture: Secondary | ICD-10-CM

## 2014-04-30 DIAGNOSIS — Z954 Presence of other heart-valve replacement: Secondary | ICD-10-CM

## 2014-04-30 DIAGNOSIS — I351 Nonrheumatic aortic (valve) insufficiency: Secondary | ICD-10-CM

## 2014-04-30 NOTE — Progress Notes (Signed)
PCP is BLISS, Lynnell Jude, MD Referring Provider is Sueanne Margarita, MD  Chief Complaint  Patient presents with  . 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 Postop atrial fibrillation now back in sinus rhythm. Postop right pleural effusion occurred following discharge requiring trip back to the emergency department and right thoracentesis of amber clear fluid. Patient now doing fairly well. Her current issues include generalized weakness, tremor, and mild ankle swelling. No palpitations no orthopnea no chest pain. Surgical incision is well-healed. Blood pressure well controlled. Not taking any pain medication.  Past Medical History  Diagnosis Date  . Hypertension   . Abnormal ECG   . Anxiety disorder   . Weight loss   . Chest pain   . Heart murmur   . Anxiety     pt. reports that she is nervous person  . Headache     use to have a lot headaches, relative to her work, but pt. is retired now.      Past Surgical History  Procedure Laterality Date  . Left and right heart catheterization with coronary angiogram N/A 03/26/2014    Procedure: LEFT AND RIGHT HEART CATHETERIZATION WITH CORONARY ANGIOGRAM;  Surgeon: Mikya Don M Martinique, MD;  Location: [facility] CATH LAB;  Service: Cardiovascular;  Laterality: N/A;  . Tonsillectomy    . Tubal ligation    . Bentall procedure N/A 03/31/2014    Procedure: BIO-BENTALL PROCEDURE using a 70mm Magna Ease Aortic Tissue Valve, a 26 mm Terumo Gelweave Valsalva Graft, a 14x10x23mm Terumo Gelweave Graft, and a 25mm straight Hemashield Platinum Graft.;  Surgeon: Ivin Poot, MD;  Location: [facility];  Service: Open Heart Surgery;  Laterality: N/A;  . Tee without cardioversion N/A 03/31/2014    Procedure: TRANSESOPHAGEAL ECHOCARDIOGRAM (TEE);  Surgeon: Ivin Poot, MD;  Location: [facility];   Service: Open Heart Surgery;  Laterality: N/A;    Family History  Problem Relation Age of Onset  . Cancer Mother   . Cancer Father   . Cancer Brother     Social History History  Substance Use Topics  . Smoking status: Never Smoker   . Smokeless tobacco: Not on file  . Alcohol Use: Yes     Comment: " I like wine", - irregular consumption - socially has wine      Current Outpatient Prescriptions  Medication Sig Dispense Refill  . amiodarone (PACERONE) 400 MG tablet Take 200 mg by mouth 2 (two) times daily.    Marland Kitchen aspirin EC 325 MG EC tablet Take 1 tablet (325 mg total) by mouth daily.    Marland Kitchen CARTIA XT 120 MG 24 hr capsule Take 120 mg by mouth daily.  12  . hydrALAZINE (APRESOLINE) 100 MG tablet Take 1 tablet (100 mg total) by mouth 2 (two) times daily. 60 tablet 1  . metoprolol succinate (TOPROL-XL) 25 MG 24 hr tablet Take 1 tablet (25 mg total) by mouth daily. 30 tablet 1  . Multiple Vitamin (MULTIVITAMIN WITH MINERALS) TABS tablet Take 1 tablet by mouth daily.     No current facility-administered medications for this visit.    No Known Allergies  Review of Systems  Afebrile Weight stable No dizziness or orthostatic problems  BP 130/62 mmHg  Pulse 60  Resp 20  Ht 5' (1.524 m)  Wt 112 lb (50.803  kg)  BMI 21.87 kg/m2  SpO2 97% Physical Exam Alert and comfortable accompanied by daughter Lungs clear Heart rhythm regular Normal biologic valve closure without aortic insufficiency Peripheral pulses intact-reduced left radial pulse baseline from arch repair Neuro intact  Diagnostic Tests: Chest x-ray shows minimal right pleural effusion with some fluid in the minor fissure. Heart size stable  Impression: Doing very well one month after a biologic Bentall and total arch replacement for aneurysm, severe AI  Plan:add Lasix 40 mg by mouth daily for the next 2 weeks and return with chest x-ray to evaluate mild right pleural effusions   stop amiodaroneas the patient has   maintained sinus rhythm for a month. Ambulation encouraged.

## 2014-05-02 ENCOUNTER — Encounter: Payer: Self-pay | Admitting: Cardiology

## 2014-05-02 ENCOUNTER — Telehealth: Payer: Self-pay | Admitting: Cardiology

## 2014-05-02 ENCOUNTER — Ambulatory Visit: Payer: 59 | Admitting: Cardiology

## 2014-05-02 ENCOUNTER — Ambulatory Visit (INDEPENDENT_AMBULATORY_CARE_PROVIDER_SITE_OTHER): Payer: 59 | Admitting: Cardiology

## 2014-05-02 VITALS — BP 120/48 | HR 84 | Ht 60.0 in | Wt 103.8 lb

## 2014-05-02 DIAGNOSIS — I2583 Coronary atherosclerosis due to lipid rich plaque: Secondary | ICD-10-CM

## 2014-05-02 DIAGNOSIS — Z952 Presence of prosthetic heart valve: Secondary | ICD-10-CM | POA: Insufficient documentation

## 2014-05-02 DIAGNOSIS — I2510 Atherosclerotic heart disease of native coronary artery without angina pectoris: Secondary | ICD-10-CM

## 2014-05-02 DIAGNOSIS — I712 Thoracic aortic aneurysm, without rupture: Secondary | ICD-10-CM

## 2014-05-02 DIAGNOSIS — I10 Essential (primary) hypertension: Secondary | ICD-10-CM

## 2014-05-02 DIAGNOSIS — I7122 Aneurysm of the aortic arch, without rupture: Secondary | ICD-10-CM

## 2014-05-02 MED ORDER — HYDRALAZINE HCL 50 MG PO TABS: 50.0000 mg | ORAL_TABLET | Freq: Two times a day (BID) | ORAL | Status: DC

## 2014-05-02 MED ORDER — HYDRALAZINE HCL 25 MG PO TABS: 25.0000 mg | ORAL_TABLET | Freq: Two times a day (BID) | ORAL | Status: DC

## 2014-05-02 NOTE — Patient Instructions (Signed)
Your physician has recommended you make the following change in your medication:  1) DECREASE HYDRALAZINE to 75 mg TWICE DAILY  Your physician has requested that you have an echocardiogram. Echocardiography is a painless test that uses sound waves to create images of your heart. It provides your doctor with information about the size and shape of your heart and how well your heart's chambers and valves are working. This procedure takes approximately one hour. There are no restrictions for this procedure.  Your physician recommends that you return for FASTING lab work in: [place] (lipids, alt)  Your physician recommends that you schedule a follow-up appointment in: [place] with PA or NP  Your physician recommends that you schedule a follow-up appointment in: 3 months with Dr. Radford Pax.

## 2014-05-02 NOTE — Progress Notes (Signed)
Cardiology Office Note   Date:  05/02/2014   ID:  Yvonne Ballard, DOB April 26, 1940, MRN [id number]  PCP:  Lynnell Jude, MD  Cardiologist:   Sueanne Margarita, MD   Chief Complaint  Patient presents with  . Hypertension  . Thoracic Aortic Aneurysm  . Coronary Artery Disease      History of Present Illness: Yvonne Ballard is a 74 y.o. female with a history of poorly controlled HTN. Her HTN is exacerbated by stress and anxiety. She denies any SOB, DOE, LE edema, dizziness, or syncope. She recently described a sensation in her chest that is not a pain but a "twitching" but also tight and squeezing that is nonexertional and usually occurs more when she is anxious. A 2D echo was done which showed a large thoracic aneurysm and the patient underwent heart catheterization showing mild nonobstructive CAD with mild LV dysfunction EF 45% with thoracic aneurysm and mildly elevated PA pressures.  She underwent Ascending TAA repair with Bio-Bental procedure with tissue AVR and aorto-brachiocephalic bypass and aorto-left common carotid bypass.  She had some post op afib treated with Amio and digoxin.  She also had post op C Diff.  She is now here for followup.  She does have some mild DOE but no anginal CP.  She has some mild pedal edema which is improving.  She denies any palpitations.     Past Medical History  Diagnosis Date  . Hypertension   . Abnormal ECG   . Anxiety disorder   . Weight loss   . Chest pain   . Heart murmur   . Anxiety     pt. reports that she is nervous person  . Headache     use to have a lot headaches, relative to her work, but pt. is retired now.    . Coronary artery disease 03/2014    mild non obstructive CAD  . Ascending aortic aneurysm     s/p repair with Bental procedure  . S/P AVR (aortic valve replacement)     tissue valve    Past Surgical History  Procedure Laterality Date  . Left and right heart catheterization with coronary angiogram N/A 03/26/2014    Procedure: LEFT AND RIGHT HEART CATHETERIZATION WITH CORONARY ANGIOGRAM;  Surgeon: Peter M Martinique, MD;  Location: [facility] CATH LAB;  Service: Cardiovascular;  Laterality: N/A;  . Tonsillectomy    . Tubal ligation    . Bentall procedure N/A 03/31/2014    Procedure: BIO-BENTALL PROCEDURE using a 37mm Magna Ease Aortic Tissue Valve, a 26 mm Terumo Gelweave Valsalva Graft, a 14x10x6mm Terumo Gelweave Graft, and a 90mm straight Hemashield Platinum Graft.;  Surgeon: Ivin Poot, MD;  Location: [facility];  Service: Open Heart Surgery;  Laterality: N/A;  . Tee without cardioversion N/A 03/31/2014    Procedure: TRANSESOPHAGEAL ECHOCARDIOGRAM (TEE);  Surgeon: Ivin Poot, MD;  Location: [facility];  Service: Open Heart Surgery;  Laterality: N/A;     Current Outpatient Prescriptions  Medication Sig Dispense Refill  . aspirin EC 325 MG EC tablet Take 1 tablet (325 mg total) by mouth daily.    Marland Kitchen CARTIA XT 120 MG 24 hr capsule Take 120 mg by mouth daily.  12  . furosemide (LASIX) 40 MG tablet Take 40 mg by mouth daily.    . hydrALAZINE (APRESOLINE) 100 MG tablet Take 1 tablet (100 mg total) by mouth 2 (two) times daily. 60 tablet 1  . metoprolol succinate (TOPROL-XL) 25 MG 24 hr tablet Take  1 tablet (25 mg total) by mouth daily. 30 tablet 1  . Multiple Vitamin (MULTIVITAMIN WITH MINERALS) TABS tablet Take 1 tablet by mouth daily.     No current facility-administered medications for this visit.    Allergies:   Review of patient's allergies indicates no known allergies.    Social History:  The patient  reports that she has never smoked. She does not have any smokeless tobacco history on file. She reports that she drinks alcohol. She reports that she does not use illicit drugs.   Family History:  The patient's family history includes Cancer in her brother, father, and mother.    ROS:  Please see the history of present illness.   Otherwise, review of systems are positive for none.   All other systems are  reviewed and negative.    PHYSICAL EXAM: VS:  BP 120/48 mmHg  Pulse 84  Ht 5' (1.524 m)  Wt 103 lb 12.8 oz (47.083 kg)  BMI 20.27 kg/m2  SpO2 97% , BMI Body mass index is 20.27 kg/(m^2). GEN: Well nourished, well developed, in no acute distress HEENT: normal Neck: no JVD, carotid bruits, or masses Cardiac: RRR; no murmurs, rubs, or gallops,no edema  Respiratory:  clear to auscultation bilaterally, normal work of breathing GI: soft, nontender, nondistended, + BS MS: no deformity or atrophy Skin: warm and dry, no rash Neuro:  Strength and sensation are intact Psych: euthymic mood, full affect   EKG:  EKG is not ordered today.    Recent Labs: 04/06/2014: Magnesium 1.8; TSH 3.073 04/07/2014: ALT 64* 04/19/2014: B Natriuretic Peptide 560.9*; BUN 13; Creatinine 0.70; Hemoglobin 12.9; Platelets 304; Potassium 3.7; Sodium 125*    Lipid Panel No results found for: CHOL, TRIG, HDL, CHOLHDL, VLDL, LDLCALC, LDLDIRECT    Wt Readings from Last 3 Encounters:  05/02/14 103 lb 12.8 oz (47.083 kg)  04/30/14 112 lb (50.803 kg)  04/19/14 112 lb (50.803 kg)      ASSESSMENT AND PLAN:  1.  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  Continue ASA.  I will check her FLP  When she comes in for PA visit 2.  Mild LV dysfunction EF 45% 3.  HTN - well controlled.  She has been fatigued in the am and worries that her BP may be too low.  At home her BP runs as low as 762GBTD systolic. I will decrease her Hydralazine to 75mg  BID and have her see my PA in 1 week for BP followup.  I do not want to drop her med too low because she needs aggressive BP control but since she is feeling weak and tired in the am we will lower the dose slightly.   4.  Ascending aortic aneurysm s/p Bental procedure with tissue AVR 5.  Post-op afib with no reoccurrence    Current medicines are reviewed at length with the patient today.  The patient does not have concerns regarding medicines.  The following changes have been made:   no change  Labs/ tests ordered today include: FLP in 1 week, 2D echo to assess AVR     Disposition:   FU with me in 3 months   Signed, Sueanne Margarita, MD  05/02/2014 11:37 AM    [facility] [address], [place], [place]  [address] Phone: [phone]; Fax: [phone]

## 2014-05-02 NOTE — Telephone Encounter (Signed)
New message      Pt was seen yesterday.  Pt has lost 4lbs this week.  She complained of feeling week.  Pt saw Dr Lucianne Lei tright Tuesday and he added furosemide this week.----wanted Dr Radford Pax to know

## 2014-05-02 NOTE — Telephone Encounter (Signed)
Dr. Radford Pax aware. No new orders placed.

## 2014-05-05 ENCOUNTER — Telehealth: Payer: Self-pay | Admitting: Cardiology

## 2014-05-05 NOTE — Telephone Encounter (Signed)
Patient BP 122/40. Patient st she is asymptomatic and actually says she feels better than she usually does. Patient has taken her medications today. Instructed patient to stay well hydrated and I will follow-up tomorrow.   To Dr. Radford Pax for review.

## 2014-05-05 NOTE — Telephone Encounter (Signed)
I would like her to check her BP daily for a week and call with all the results

## 2014-05-05 NOTE — Telephone Encounter (Signed)
New message      Pt c/o BP issue: STAT if pt c/o blurred vision, one-sided weakness or slurred speech  1. What are your last 5 BP readings?122/40---pulse 70, resp 16, pulse ox 97%,  2. Are you having any other symptoms (ex. Dizziness, headache, blurred vision, passed out)? dizziness 3. What is your BP issue? Patients bp is still low.  SOB has decreased

## 2014-05-07 ENCOUNTER — Encounter: Payer: 59 | Admitting: Cardiothoracic Surgery

## 2014-05-07 NOTE — Telephone Encounter (Signed)
Instructed patient to check her BP daily for a week and call with results. Patient concerned her BP is wrong and her [facility] will not be in soon to check it. Per patient's permission, scheduled BP check tomorrow after her ECHO to compare her cuff versus ours. Patient understands to bring BP cuff to appointment.

## 2014-05-08 ENCOUNTER — Ambulatory Visit (INDEPENDENT_AMBULATORY_CARE_PROVIDER_SITE_OTHER): Payer: 59

## 2014-05-08 ENCOUNTER — Telehealth: Payer: Self-pay

## 2014-05-08 ENCOUNTER — Other Ambulatory Visit (INDEPENDENT_AMBULATORY_CARE_PROVIDER_SITE_OTHER): Payer: 59 | Admitting: *Deleted

## 2014-05-08 ENCOUNTER — Ambulatory Visit (HOSPITAL_COMMUNITY): Payer: 59 | Attending: Cardiology | Admitting: Radiology

## 2014-05-08 ENCOUNTER — Telehealth: Payer: Self-pay | Admitting: *Deleted

## 2014-05-08 VITALS — BP 118/58 | HR 97 | Wt 98.1 lb

## 2014-05-08 DIAGNOSIS — E785 Hyperlipidemia, unspecified: Secondary | ICD-10-CM

## 2014-05-08 DIAGNOSIS — I10 Essential (primary) hypertension: Secondary | ICD-10-CM

## 2014-05-08 DIAGNOSIS — I712 Thoracic aortic aneurysm, without rupture: Secondary | ICD-10-CM | POA: Insufficient documentation

## 2014-05-08 DIAGNOSIS — I359 Nonrheumatic aortic valve disorder, unspecified: Secondary | ICD-10-CM

## 2014-05-08 DIAGNOSIS — I2510 Atherosclerotic heart disease of native coronary artery without angina pectoris: Secondary | ICD-10-CM

## 2014-05-08 DIAGNOSIS — I7122 Aneurysm of the aortic arch, without rupture: Secondary | ICD-10-CM

## 2014-05-08 DIAGNOSIS — I2583 Coronary atherosclerosis due to lipid rich plaque: Principal | ICD-10-CM

## 2014-05-08 LAB — LIPID PANEL
CHOLESTEROL: 209 mg/dL — AB (ref 0–200)
HDL: 66.8 mg/dL (ref >39.00)
LDL CALC: 120 mg/dL — AB (ref 0–99)
NonHDL: 142.2
TRIGLYCERIDES: 111 mg/dL (ref 0.0–149.0)
Total CHOL/HDL Ratio: 3
VLDL: 22.2 mg/dL (ref 0.0–40.0)

## 2014-05-08 LAB — ALT: ALT: 24 U/L (ref 0–35)

## 2014-05-08 MED ORDER — ATORVASTATIN CALCIUM 20 MG PO TABS: 20.0000 mg | ORAL_TABLET | Freq: Every day | ORAL | Status: DC

## 2014-05-08 NOTE — Telephone Encounter (Signed)
Spoke with [facility] and they have confirmed they have order for pt sample; pt is aware of where to go to drop off sample.

## 2014-05-08 NOTE — Telephone Encounter (Signed)
-----   Message from Sueanne Margarita, MD sent at 05/08/2014  2:54 PM EST ----- LDL slightly elevated - goal < 70.  Start Lipitor 20mg  daily and recheck FLP and ALT in 6 wees

## 2014-05-08 NOTE — Progress Notes (Signed)
Echocardiogram performed.  

## 2014-05-08 NOTE — Telephone Encounter (Signed)
Contacted PCP on pt behalf related to pt having multiple stools in a day and wanting to leave a sample to test for C.Diff.  Spoke with Joy in Dr. Eula Flax office, she placed order for pt to leave stool sample.  She stated it was okay for pt to leave sample at [facility]. [employer] to verify they had the order and pt cold leave stool sample there.  They could not currently see order and were going to call pts. PCP to let them know and call me back to confirm with pt.

## 2014-05-08 NOTE — Telephone Encounter (Signed)
Informed pt of lab results and new orders as listed below. Verified pharmacy and informed pt that I would send prescription over. Lab appt made for 06/16/14.  Pt verbalized understanding and was in agreement with plan.

## 2014-05-08 NOTE — Progress Notes (Signed)
1.) Reason for visit: BP Check  2.) Name of MD requesting visit: Turner  3.) H&P: Pt c/o of tiredness and lack of energy during the day. Still able to complete ADL's but not much energy to do much else.   4.) ROS related to problem: Pt VS during visit 118/58; HR 97; Oxygen 99; pt weight down from last visit to 98 lbs.   Pt had post-op C-diff and completed round of Flagyl on 1/29  But pt still having multiple small loose stools during the day. Reviewed pt diet and provided edication on food choices to help with diarrhea.  Pt has contacted her PCP and does not feel that they listened to her.  Told pt I would call her PCP help explain her situation and get back to her of the plan  if they need a stool sample or have any suggestions.  Pt agreed and very appreciative.

## 2014-05-08 NOTE — Patient Instructions (Signed)
Your physician recommends that you continue on your current medications as directed. Please refer to the Current Medication list given to you today.  Continue with current diet. Review diet suggestions to help increase weight.   Call our office if you have any questions.

## 2014-05-13 ENCOUNTER — Telehealth (HOSPITAL_COMMUNITY): Payer: Self-pay | Admitting: Cardiac Rehabilitation

## 2014-05-13 ENCOUNTER — Other Ambulatory Visit: Payer: Self-pay | Admitting: Cardiothoracic Surgery

## 2014-05-13 DIAGNOSIS — I712 Thoracic aortic aneurysm, without rupture, unspecified: Secondary | ICD-10-CM

## 2014-05-13 NOTE — Progress Notes (Signed)
Please have patient decrease metoprolol to 12.5mg  daily and recheck BP in 1 week with nurse.  Patient is fatigued and hopefully backing off of BB will help.

## 2014-05-13 NOTE — Telephone Encounter (Signed)
Pt contacted to enroll in cardiac rehab services.  Pt declined stating she prefers to exercise on her own. Dr. Radford Pax made aware.

## 2014-05-14 ENCOUNTER — Telehealth: Payer: Self-pay

## 2014-05-14 ENCOUNTER — Encounter: Payer: Self-pay | Admitting: Cardiology

## 2014-05-14 ENCOUNTER — Ambulatory Visit
Admission: RE | Admit: 2014-05-14 | Discharge: 2014-05-14 | Disposition: A | Discharge status: Home / Self Care | Payer: Medicare Other | Source: Ambulatory Visit | Attending: Cardiothoracic Surgery | Admitting: Cardiothoracic Surgery

## 2014-05-14 ENCOUNTER — Ambulatory Visit (INDEPENDENT_AMBULATORY_CARE_PROVIDER_SITE_OTHER): Payer: Self-pay | Admitting: Cardiothoracic Surgery

## 2014-05-14 ENCOUNTER — Encounter: Payer: Self-pay | Admitting: Cardiothoracic Surgery

## 2014-05-14 VITALS — BP 150/50 | HR 76 | Resp 18 | Ht 60.0 in | Wt 103.0 lb

## 2014-05-14 DIAGNOSIS — I712 Thoracic aortic aneurysm, without rupture, unspecified: Secondary | ICD-10-CM

## 2014-05-14 DIAGNOSIS — J90 Pleural effusion, not elsewhere classified: Secondary | ICD-10-CM

## 2014-05-14 DIAGNOSIS — I351 Nonrheumatic aortic (valve) insufficiency: Secondary | ICD-10-CM

## 2014-05-14 DIAGNOSIS — Z952 Presence of prosthetic heart valve: Secondary | ICD-10-CM

## 2014-05-14 DIAGNOSIS — Z9889 Other specified postprocedural states: Secondary | ICD-10-CM

## 2014-05-14 DIAGNOSIS — I7122 Aneurysm of the aortic arch, without rupture: Secondary | ICD-10-CM

## 2014-05-14 DIAGNOSIS — I7121 Aneurysm of the ascending aorta, without rupture: Secondary | ICD-10-CM

## 2014-05-14 DIAGNOSIS — Z954 Presence of other heart-valve replacement: Secondary | ICD-10-CM

## 2014-05-14 DIAGNOSIS — J948 Other specified pleural conditions: Secondary | ICD-10-CM

## 2014-05-14 DIAGNOSIS — Z95828 Presence of other vascular implants and grafts: Secondary | ICD-10-CM

## 2014-05-14 MED ORDER — METOPROLOL SUCCINATE ER 25 MG PO TB24: 12.5000 mg | ORAL_TABLET | Freq: Every day | ORAL | Status: DC

## 2014-05-14 NOTE — Addendum Note (Signed)
Addended by: Harland German A on: 05/14/2014 05:31 PM   Modules accepted: Orders

## 2014-05-14 NOTE — Telephone Encounter (Signed)
This encounter was created in error - please disregard.

## 2014-05-14 NOTE — Telephone Encounter (Signed)
Instructed patient to DECREASE metoprolol to 12.5 mg daily. Patient refuses RN BP check, so instructed her to check her BP daily for one week and call with results. Patient agrees with treatment plan.

## 2014-05-14 NOTE — Telephone Encounter (Deleted)
-----   Message from Sueanne Margarita, MD sent at 05/13/2014  9:44 PM EST -----   ----- Message -----    From: Newt Minion, RN    Sent: 05/13/2014   3:48 PM      To: Sueanne Margarita, MD

## 2014-05-14 NOTE — Telephone Encounter (Signed)
Yvonne Margarita, MD at 05/13/2014 9:43 PM     Status: Signed       Expand All Collapse All   Please have patient decrease metoprolol to 12.5mg  daily and recheck BP in 1 week with nurse. Patient is fatigued and hopefully backing off of BB will help.        Left message for patient to call back with instructions per Dr. Radford Pax.

## 2014-05-14 NOTE — Progress Notes (Signed)
PCP is BLISS, Lynnell Jude, MD Referring Provider is Sueanne Margarita, MD  Chief Complaint  Patient presents with  . Routine Post Op    2 week f/u with CXR re eval right pleural effusion s/p Bentall    HPI: 10 week followup after undergoing biologic Bentall procedure with total arch replacement using elephant trunk distal anastomosis proximal to the origin of the left subclavian artery-her left radial pulse is diminished. 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 She has a history of hypertension requiring multiple medications. Postoperative she developed a right pleural effusion which required thoracentesis. She was started on Lasix he returns now with a followup chest x-ray. Her shortness of breath his resolved. She has no significant chest pain. She is gaining strength. She denies any neurological symptoms.  Past Medical History  Diagnosis Date  . Hypertension   . Abnormal ECG   . Anxiety disorder   . Weight loss   . Chest pain   . Heart murmur   . Anxiety     pt. reports that she is nervous person  . Headache     use to have a lot headaches, relative to her work, but pt. is retired now.    . Coronary artery disease 03/2014    mild non obstructive CAD  . Ascending aortic aneurysm     s/p repair with Bental procedure  . S/P AVR (aortic valve replacement)     tissue valve    Past Surgical History  Procedure Laterality Date  . Left and right heart catheterization with coronary angiogram N/A 03/26/2014    Procedure: LEFT AND RIGHT HEART CATHETERIZATION WITH CORONARY ANGIOGRAM;  Surgeon: Peter M Martinique, MD;  Location: [facility] CATH LAB;  Service: Cardiovascular;  Laterality: N/A;  . Tonsillectomy    . Tubal ligation    . Bentall procedure N/A 03/31/2014    Procedure: BIO-BENTALL PROCEDURE using a 22mm Magna Ease Aortic Tissue Valve, a 26 mm Terumo Gelweave Valsalva Graft, a 14x10x51mm Terumo Gelweave Graft, and a 27mm straight Hemashield  Platinum Graft.;  Surgeon: Ivin Poot, MD;  Location: [facility];  Service: Open Heart Surgery;  Laterality: N/A;  . Tee without cardioversion N/A 03/31/2014    Procedure: TRANSESOPHAGEAL ECHOCARDIOGRAM (TEE);  Surgeon: Ivin Poot, MD;  Location: [facility];  Service: Open Heart Surgery;  Laterality: N/A;    Family History  Problem Relation Age of Onset  . Cancer Mother   . Cancer Father   . Cancer Brother     Social History History  Substance Use Topics  . Smoking status: Never Smoker   . Smokeless tobacco: Not on file  . Alcohol Use: Yes     Comment: " I like wine", - irregular consumption - socially has wine      Current Outpatient Prescriptions  Medication Sig Dispense Refill  . aspirin EC 325 MG EC tablet Take 1 tablet (325 mg total) by mouth daily.    Marland Kitchen CARTIA XT 120 MG 24 hr capsule Take 120 mg by mouth daily.  12  . furosemide (LASIX) 40 MG tablet Take 40 mg by mouth daily.    . hydrALAZINE (APRESOLINE) 25 MG tablet Take 1 tablet (25 mg total) by mouth 2 (two) times daily. 60 tablet 6  . hydrALAZINE (APRESOLINE) 50 MG tablet Take 1 tablet (50 mg total) by mouth 2 (two) times daily. 60 tablet 6  . metoprolol succinate (TOPROL-XL) 25 MG 24 hr tablet  Take 1 tablet (25 mg total) by mouth daily. 30 tablet 1  . Multiple Vitamin (MULTIVITAMIN WITH MINERALS) TABS tablet Take 1 tablet by mouth daily.     No current facility-administered medications for this visit.    No Known Allergies  Review of Systems  Improved appetite and strength She is driving and doing housework She's had a tremor preoperatively which is somewhat worse following surgery  BP 150/50 mmHg  Pulse 76  Resp 18  Ht 5' (1.524 m)  Wt 103 lb (46.72 kg)  BMI 20.12 kg/m2  SpO2  Physical Exam Alert and comfortable appropriate Lungs clear Heart rhythm regular No murmur of AI Incision well-healed Good peripheral pulses except for baseline diminished left radial pulse from elephant trunk repair proximal to  the left subclavian  Diagnostic Tests: Chest x-ray shows resolution right pleural effusion No evidence of interstitial edema Mediastinal silhouette remains normal  Impression: Doing extremely well now 10 weeks postop The pleural effusion has resolved on Lasix 40 mg a day. She appears to be very Volume depleted with decreased skin turgor. We'll reduce the Lasix to 20 mg every other day.  She'll continue to follow light duty activity only. I'll see her back in 3 months with chest x-ray.  Plan:return in 3 months with chest x-ray Continue antihypertensive medication Reduce Lasix 20 mg every other day

## 2014-05-14 NOTE — Telephone Encounter (Signed)
New message     Returning Yvonne Ballard's call

## 2014-05-15 NOTE — Progress Notes (Addendum)
Cardiology Office Note   Date:  05/16/2014   ID:  Yvonne Ballard, DOB 1941-01-18, MRN [id number]  PCP:  Lynnell Jude, MD  Cardiologist:  Dr. Radford Pax    HTN     History of Present Illness: Yvonne Ballard is a 74 y.o. female with a hx of HTN, HLD, thoracic aortic aneurysm s/p Bental procedure (03/2014), and nonobst CAD who presents for follow up for her hypertension.   She was initially seen in 03/2014. A 2D echo was done which showed a large thoracic aneurysm and the patient underwent heart catheterization showing mild nonobstructive CAD with mild LV dysfunction EF 45% with thoracic aneurysm and mildly elevated PA pressures. She underwent ascending TAA repair with Bio-Bental procedure with tissue AVR and aorto-brachiocephalic bypass and aorto-left common carotid bypass on 03/31/2014. Postoperatively, she developed a right pleural effusion which required thoracentesis and was treated with lasix. She had some post op afib treated with Amio and digoxin.She also had post op C Diff.   She was seen by Dr. Radford Pax on 05/02/14 for follow up. She was felt to be doing well at that time however, she did complain of some fatigue and worry that her BP was running too low. Her hydralazine was lowered to 75mg  BID and she was scheduled for 1 week follow up with with me. Subsequent phone notes reveal that her Toprol XL was also decreased to 12.5mg  qd. She was seen by Dr. Nils Pyle on 05/14/14 who felt she was volume depleted and decreased her lasix from 40mg  qd to 20mg  every other day.   Today she presents for follow up on her hypertension. She has been taking her BP everyday at home and BPs have been well controlled ranging from SBP of 116-140. Her SBP noted to go up into the 150s when at the Drs office. She has been feeling much better in terms of weakness and fatigue on lower doses of BP meds.   She denies any CP, SOB, LE swelling, orthopnea, or dizziness. She is feeling quite well. She expresses irritation  today that she is being "micromanaged" by our office and she prefers a more hands off approach. She does not like having to come into have quick visits and pay a copay each time. I assured her that we are just trying to find a stable BP regimen with her recent ascending TAA repair/ AVR /aorto-brachiocephalic bypass and aorto-left common carotid bypass. She seemed to understand.    Past Medical History  Diagnosis Date  . Hypertension   . Abnormal ECG   . Anxiety disorder   . Weight loss   . Chest pain   . Heart murmur   . Anxiety     pt. reports that she is nervous person  . Headache     use to have a lot headaches, relative to her work, but pt. is retired now.    . Coronary artery disease 03/2014    mild non obstructive CAD  . Ascending aortic aneurysm     s/p repair with Bental procedure  . S/P AVR (aortic valve replacement)     tissue valve    Past Surgical History  Procedure Laterality Date  . Left and right heart catheterization with coronary angiogram N/A 03/26/2014    Procedure: LEFT AND RIGHT HEART CATHETERIZATION WITH CORONARY ANGIOGRAM;  Surgeon: Peter M Martinique, MD;  Location: [facility] CATH LAB;  Service: Cardiovascular;  Laterality: N/A;  . Tonsillectomy    . Tubal ligation    .  Bentall procedure N/A 03/31/2014    Procedure: BIO-BENTALL PROCEDURE using a 20mm Magna Ease Aortic Tissue Valve, a 26 mm Terumo Gelweave Valsalva Graft, a 14x10x10mm Terumo Gelweave Graft, and a 41mm straight Hemashield Platinum Graft.;  Surgeon: Ivin Poot, MD;  Location: [place];  Service: Open Heart Surgery;  Laterality: N/A;  . Tee without cardioversion N/A 03/31/2014    Procedure: TRANSESOPHAGEAL ECHOCARDIOGRAM (TEE);  Surgeon: Ivin Poot, MD;  Location: [place];  Service: Open Heart Surgery;  Laterality: N/A;     Current Outpatient Prescriptions  Medication Sig Dispense Refill  . aspirin EC 325 MG EC tablet Take 1 tablet (325 mg total) by mouth daily.    Marland Kitchen CARTIA XT 120 MG 24 hr capsule  Take 120 mg by mouth daily.  12  . furosemide (LASIX) 40 MG tablet Take 40 mg by mouth daily.    . hydrALAZINE (APRESOLINE) 25 MG tablet Take 1 tablet (25 mg total) by mouth 2 (two) times daily. 60 tablet 6  . hydrALAZINE (APRESOLINE) 50 MG tablet Take 1 tablet (50 mg total) by mouth 2 (two) times daily. 60 tablet 6  . metoprolol succinate (TOPROL-XL) 25 MG 24 hr tablet Take 0.5 tablets (12.5 mg total) by mouth daily. 30 tablet 1  . Multiple Vitamin (MULTIVITAMIN WITH MINERALS) TABS tablet Take 1 tablet by mouth daily.     No current facility-administered medications for this visit.    Allergies:   Review of patient's allergies indicates no known allergies.    Social History:  The patient  reports that she has never smoked. She does not have any smokeless tobacco history on file. She reports that she drinks alcohol. She reports that she does not use illicit drugs.   Family History:  The patient's family history includes Cancer in her brother, father, and mother.    ROS:  Please see the history of present illness.   Otherwise, review of systems are positive for none.   All other systems are reviewed and negative.    PHYSICAL EXAM: VS:  BP 152/64 mmHg  Pulse 72  Ht 5' (1.524 m)  Wt 102 lb 6.4 oz (46.448 kg)  BMI 20.00 kg/m2  SpO2 98% , BMI Body mass index is 20 kg/(m^2). GEN: Well nourished, well developed, in no acute distress HEENT: normal Neck: no JVD, carotid bruits, or masses Cardiac: RRR; + 3/6 SEM, rubs, or gallops,no edema  Respiratory:  clear to auscultation bilaterally, normal work of breathing GI: soft, nontender, nondistended, + BS MS: no deformity or atrophy Skin: warm and dry, no rash Neuro:  Strength and sensation are intact Psych: euthymic mood, full affect   EKG:  EKG is not ordered today.   Recent Labs: 04/06/2014: Magnesium 1.8; TSH 3.073 04/19/2014: B Natriuretic Peptide 560.9*; BUN 13; Creatinine 0.70; Hemoglobin 12.9; Platelets 304; Potassium 3.7; Sodium  125* 05/08/2014: ALT 24    Lipid Panel    Component Value Date/Time   CHOL 209* 05/08/2014 0902   TRIG 111.0 05/08/2014 0902   HDL 66.80 05/08/2014 0902   CHOLHDL 3 05/08/2014 0902   VLDL 22.2 05/08/2014 0902   LDLCALC 120* 05/08/2014 0902      Wt Readings from Last 3 Encounters:  05/16/14 102 lb 6.4 oz (46.448 kg)  05/14/14 103 lb (46.72 kg)  05/08/14 98 lb 1.9 oz (44.507 kg)      Other studies Reviewed: Additional studies/ records that were reviewed today include: 2D ECHO Review of the above records demonstrates:  -- 2D ECHO (  05/08/14) EF 55-60%, no RWMA, G2DD, s/p biological Bentall procedure w/ total large replacement of a large arch aneurysm. Mean AV grad WNL for bioprosthesis. No AR. Mod MR thickening. L pleural effusion.    ASSESSMENT AND PLAN:  Yvonne Ballard is a 74 y.o. female with a hx of HTN, HLD, thoracic aortic aneurysm s/p Bental procedure (03/2014), and nonobst CAD who presents for follow up for her hypertension.   HTN -Today her BP in 152/64 and it was 150/50 on 05/14/14 at Dr. Lucianne Lei Tright's office. She has been taking Cartia XT 120mg , hydralazine 75mg  BID Torprol XL 12.5mg  qd and lasix 20mg  Q every other day.   -- She has been taking her BP everyday at home and BPs have been well controlled ranging from SBP of 116-140. Her SBP noted to go up into the 150s when at the Drs office. She has been feeling much better in terms of weakness and fatigue on lower doses of BP meds. -- Since her BP has been well controlled at home, I feel her slightly elevated pressures in the office are due to white coat HTN. Since she is feeling significantly better on reduced doses of BP meds, I am reluctant to change her regimen today. I have encouraged her to continue to monitor her BP at home and to call us if it becomes elevated.   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 Continue ASA.  HLD- FLP on 05/08/14 with LDL 120. Started on lipitor 20mg .  -- She has follow up lipids and LFT on  06/16/14.   Mild LV dysfunction EF 45%- now resolved on last ECHO  Ascending aortic aneurysm s/p Bental procedure with tissue AVR -- 2D ECHO ON 05/08/14 with normal LVF with stable AVR and ascending aortic replacement  Post-op afib with no reoccurrence   Current medicines are reviewed at length with the patient today.  The patient does not have concerns regarding medicines.  The following changes have been made:  no change  Labs/ tests ordered today include: none     Disposition:   FU with 07/28/14 with Dr. Radford Pax as previously scheduled.    Renea Ee  05/16/2014 9:53 AM    [facility] [place], [place], [place]  [address] Phone: [phone]; Fax: [phone]

## 2014-05-16 ENCOUNTER — Ambulatory Visit (INDEPENDENT_AMBULATORY_CARE_PROVIDER_SITE_OTHER): Payer: 59 | Admitting: Physician Assistant

## 2014-05-16 ENCOUNTER — Encounter: Payer: Self-pay | Admitting: Physician Assistant

## 2014-05-16 VITALS — BP 152/64 | HR 72 | Ht 60.0 in | Wt 102.4 lb

## 2014-05-16 DIAGNOSIS — I10 Essential (primary) hypertension: Secondary | ICD-10-CM

## 2014-05-16 DIAGNOSIS — I2510 Atherosclerotic heart disease of native coronary artery without angina pectoris: Secondary | ICD-10-CM

## 2014-05-16 NOTE — Patient Instructions (Signed)
Your physician recommends that you continue on your current medications as directed. Please refer to the Current Medication list given to you today.  Your physician recommends that you follow-up with Dr. Radford Pax on Jul 28, 2014.

## 2014-05-23 ENCOUNTER — Telehealth: Payer: Self-pay | Admitting: Cardiology

## 2014-05-23 NOTE — Telephone Encounter (Signed)
Patient calling to report BP log: 3/5 133/48 HR 61 3/6 139/59 HR 65 3/7 149/78 HR 71 3/8 123/61 HR 67 3/9 141/63 HR 79 3/10 148/60 HR 73 3/11 133/71 HR 79   To Dr. Radford Pax for review.

## 2014-05-23 NOTE — Telephone Encounter (Signed)
New message      Pt said she is to call today and give Yvonne Ballard an update

## 2014-05-25 NOTE — Telephone Encounter (Signed)
BP and HR stable continue current meds

## 2014-05-26 NOTE — Telephone Encounter (Signed)
Notified patient that Dr. Radford Pax reviewed her current BP/HR's that she called in on Friday. Dr. Radford Pax indicated that the BP and HR were stable. Patient is to continue current meds. Patient verbalized understanding and agreement with plan.

## 2014-06-16 ENCOUNTER — Other Ambulatory Visit: Payer: 59

## 2014-07-26 ENCOUNTER — Encounter: Payer: Self-pay | Admitting: Cardiology

## 2014-07-26 DIAGNOSIS — I2510 Atherosclerotic heart disease of native coronary artery without angina pectoris: Secondary | ICD-10-CM | POA: Insufficient documentation

## 2014-07-26 DIAGNOSIS — E785 Hyperlipidemia, unspecified: Secondary | ICD-10-CM

## 2014-07-26 HISTORY — DX: Hyperlipidemia, unspecified: E78.5

## 2014-07-26 NOTE — Progress Notes (Signed)
Cardiology Office Note   Date:  07/28/2014   ID:  Yvonne Ballard, DOB 18-Apr-1940, MRN [id number]  PCP:  Lynnell Jude, MD    Chief Complaint  Patient presents with  . Thoracic Aortic Aneurysm  . Follow-up    hypertension      History of Present Illness: Yvonne Ballard is a 74 y.o. female with a history of poorly controlled HTN, mild nonobstructive ASCAD with mild LV dysfunction EF 45% by cath and now resolved with EF 60% by last echo, thoracic aortic aneurysm s/p Ascending TAA repair with Bio-Bental procedure with tissue AVR and aorto-brachiocephalic bypass and aorto-left common carotid bypass.Her HTN is exacerbated by stress and anxiety. She denies any anginal chest pain, SOB, DOE, LE edema,  or syncope. She denies any palpitations. She says that occasionally she will get overwhelming dizziness when standing.  She is concerned that the Hydralazine might be causing it.       Past Medical History  Diagnosis Date  . Hypertension   . Abnormal ECG   . Anxiety disorder   . Weight loss   . Chest pain   . Heart murmur   . Anxiety     pt. reports that she is nervous person  . Headache     use to have a lot headaches, relative to her work, but pt. is retired now.    . Coronary artery disease 03/2014    mild non obstructive CAD  . Ascending aortic aneurysm     s/p repair with Bental procedure  . S/P AVR (aortic valve replacement)     tissue valve  . Dyslipidemia, goal LDL below 70 07/26/2014    Past Surgical History  Procedure Laterality Date  . Left and right heart catheterization with coronary angiogram N/A 03/26/2014    Procedure: LEFT AND RIGHT HEART CATHETERIZATION WITH CORONARY ANGIOGRAM;  Surgeon: Peter M Martinique, MD;  Location: [facility] CATH LAB;  Service: Cardiovascular;  Laterality: N/A;  . Tonsillectomy    . Tubal ligation    . Bentall procedure N/A 03/31/2014    Procedure: BIO-BENTALL PROCEDURE using a 71m Magna Ease Aortic Tissue Valve, a 26 mm Terumo Gelweave  Valsalva Graft, a 14x10x132mTerumo Gelweave Graft, and a 2858mtraight Hemashield Platinum Graft.;  Surgeon: PetIvin PootD;  [facility];  Laterality: N/A;  . Tee without cardioversion N/A 03/31/2014    Procedure: TRANSESOPHAGEAL ECHOCARDIOGRAM (TEE);  Surgeon: PetIvin PootD;  [facility];  Laterality: N/A;     Current Outpatient Prescriptions  Medication Sig Dispense Refill  . aspirin EC 325 MG EC tablet Take 1 tablet (325 mg total) by mouth daily.    . CMarland KitchenRTIA XT 120 MG 24 hr capsule Take 120 mg by mouth daily.  12  . hydrALAZINE (APRESOLINE) 25 MG tablet Take 1 tablet (25 mg total) by mouth 2 (two) times daily. 60 tablet 6  . hydrALAZINE (APRESOLINE) 50 MG tablet Take 1 tablet (50 mg total) by mouth 2 (two) times daily. 60 tablet 6  . metoprolol succinate (TOPROL-XL) 25 MG 24 hr tablet Take 0.5 tablets (12.5 mg total) by mouth daily. 30 tablet 1  . Multiple Vitamin (MULTIVITAMIN WITH MINERALS) TABS tablet Take 1 tablet by mouth daily.    . furosemide (LASIX) 40 MG tablet Take 40 mg by mouth daily.     No current facility-administered medications for this visit.    Allergies:   Review of  patient's allergies indicates no known allergies.    Social History:  The patient  reports that she has never smoked. She does not have any smokeless tobacco history on file. She reports that she drinks alcohol. She reports that she does not use illicit drugs.   Family History:  The patient's family history includes Cancer in her brother, father, and mother.    ROS:  Please see the history of present illness.   Otherwise, review of systems are positive for none.   All other systems are reviewed and negative.    PHYSICAL EXAM: VS:  BP 160/56 mmHg  Pulse 73  Ht 5' (1.524 m)  Wt 103 lb (46.72 kg)  BMI 20.12 kg/m2  SpO2 97% , BMI Body mass index is 20.12 kg/(m^2). GEN: Well nourished, well developed, in no acute  distress HEENT: normal Neck: no JVD, carotid bruits, or masses Cardiac: RRR; no murmurs, rubs, or gallops,no edema  Respiratory:  clear to auscultation bilaterally, normal work of breathing GI: soft, nontender, nondistended, + BS MS: no deformity or atrophy Skin: warm and dry, no rash Neuro:  Strength and sensation are intact Psych: euthymic mood, full affect   EKG:  EKG is not ordered today.    Recent Labs: 04/06/2014: Magnesium 1.8; TSH 3.073 04/19/2014: B Natriuretic Peptide 560.9*; BUN 13; Creatinine 0.70; Hemoglobin 12.9; Platelets 304; Potassium 3.7; Sodium 125* 05/08/2014: ALT 24    Lipid Panel    Component Value Date/Time   CHOL 209* 05/08/2014 0902   TRIG 111.0 05/08/2014 0902   HDL 66.80 05/08/2014 0902   CHOLHDL 3 05/08/2014 0902   VLDL 22.2 05/08/2014 0902   LDLCALC 120* 05/08/2014 0902      Wt Readings from Last 3 Encounters:  07/28/14 103 lb (46.72 kg)  05/16/14 102 lb 6.4 oz (46.448 kg)  05/14/14 103 lb (46.72 kg)    ASSESSMENT AND PLAN:  1. 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 Continue ASA.  2. Mild LV dysfunction - resolved and EF now 60% 3. HTN - borderline controlled on Cartia/hydralazine and BB.  She had been on HCTZ at home but this was stopped and she was on Lasix post op which she has stopped.  I have recommended that she restart HCTZ '25mg'$  daily.  I have asked her to check her BP Twice daily for a week and call with the results.   4. Ascending aortic aneurysm s/p Bental procedure with tissue AVR 5. Post-op afib with no reoccurrence 6.  Dyslipidemia - on Lipitor.  Check FLP and ALT since it has been over 2 months after starting statin therapy    Current medicines are reviewed at length with the patient today.  The patient does not have concerns regarding medicines.  The following changes have been made:  no change  Labs/ tests ordered today include: see above assessment and plan No orders of the defined types were placed in this  encounter.     Disposition:   FU with me in 6 months   Signed, Sueanne Margarita, MD  07/28/2014 10:24 AM    [facility] [place], [place], [place]  [address] Phone: [phone]; Fax: [phone]

## 2014-07-28 ENCOUNTER — Ambulatory Visit (INDEPENDENT_AMBULATORY_CARE_PROVIDER_SITE_OTHER): Payer: PRIVATE HEALTH INSURANCE | Admitting: Cardiology

## 2014-07-28 ENCOUNTER — Encounter: Payer: Self-pay | Admitting: Cardiology

## 2014-07-28 VITALS — BP 160/56 | HR 73 | Ht 60.0 in | Wt 103.0 lb

## 2014-07-28 DIAGNOSIS — I10 Essential (primary) hypertension: Secondary | ICD-10-CM

## 2014-07-28 DIAGNOSIS — I712 Thoracic aortic aneurysm, without rupture, unspecified: Secondary | ICD-10-CM

## 2014-07-28 DIAGNOSIS — Z952 Presence of prosthetic heart valve: Secondary | ICD-10-CM

## 2014-07-28 DIAGNOSIS — I2510 Atherosclerotic heart disease of native coronary artery without angina pectoris: Secondary | ICD-10-CM | POA: Diagnosis not present

## 2014-07-28 DIAGNOSIS — Z954 Presence of other heart-valve replacement: Secondary | ICD-10-CM

## 2014-07-28 DIAGNOSIS — E785 Hyperlipidemia, unspecified: Secondary | ICD-10-CM

## 2014-07-28 MED ORDER — HYDROCHLOROTHIAZIDE 25 MG PO TABS: 25.0000 mg | ORAL_TABLET | Freq: Every day | ORAL | Status: DC

## 2014-07-28 NOTE — Patient Instructions (Signed)
Medication Instructions:  Your physician has recommended you make the following change in your medication:  1) START HCTZ 25 mg daily  Labwork: Your physician recommends that you return for lab work on Monday, Aug 04, 2014. You may come ANY TIME between 7:30 AM and 5:00 PM.  Testing/Procedures: None  Follow-Up: Your physician wants you to follow-up in: 6 months with Dr. Radford Pax. You will receive a reminder letter in the mail two months in advance. If you don't receive a letter, please call our office to schedule the follow-up appointment.   Any Other Special Instructions Will Be Listed Below (If Applicable).

## 2014-07-29 ENCOUNTER — Telehealth: Payer: Self-pay | Admitting: Cardiology

## 2014-07-29 NOTE — Telephone Encounter (Signed)
New message      Pt was seen yesterday.  There is a medication on her medication list that she does not take.  It is metoprolol succinate.  Should she be taking it?

## 2014-07-29 NOTE — Telephone Encounter (Signed)
No VM set up - unable to leave message.  Will try again later.

## 2014-07-31 MED ORDER — METOPROLOL SUCCINATE ER 25 MG PO TB24: 12.5000 mg | ORAL_TABLET | Freq: Every day | ORAL | Status: DC

## 2014-07-31 NOTE — Telephone Encounter (Signed)
F/u ° ° °Pt returning your call °

## 2014-07-31 NOTE — Telephone Encounter (Signed)
Restart Toprol '25mg'$  1/2 tablet daily

## 2014-07-31 NOTE — Telephone Encounter (Signed)
Left message to call back  

## 2014-07-31 NOTE — Telephone Encounter (Signed)
Instructed patient to RESTART TOPROL 25 mg 1/2 tablet daily. Patient agrees with treatment plan.

## 2014-07-31 NOTE — Telephone Encounter (Signed)
Patient st she was looking at her medication list and has not taken Toprol in 3-4 months. She ran out of the Rx and assumed that meant Dr. Radford Pax stopped the medication. Informed the patient Dr. Radford Pax is not here today, but I will call the patient Monday with Dr. Theodosia Blender instructions. Instructed the patient not to stop medications unless instructed by a doctor.  Patient agrees with treatment plan.

## 2014-08-04 ENCOUNTER — Other Ambulatory Visit (INDEPENDENT_AMBULATORY_CARE_PROVIDER_SITE_OTHER): Payer: PRIVATE HEALTH INSURANCE | Admitting: *Deleted

## 2014-08-04 DIAGNOSIS — I2510 Atherosclerotic heart disease of native coronary artery without angina pectoris: Secondary | ICD-10-CM

## 2014-08-04 DIAGNOSIS — I10 Essential (primary) hypertension: Secondary | ICD-10-CM

## 2014-08-04 DIAGNOSIS — E785 Hyperlipidemia, unspecified: Secondary | ICD-10-CM | POA: Diagnosis not present

## 2014-08-04 LAB — LIPID PANEL
CHOLESTEROL: 226 mg/dL — AB (ref 0–200)
HDL: 65.9 mg/dL (ref >39.00)
LDL CALC: 138 mg/dL — AB (ref 0–99)
NonHDL: 160.1
TRIGLYCERIDES: 110 mg/dL (ref 0.0–149.0)
Total CHOL/HDL Ratio: 3
VLDL: 22 mg/dL (ref 0.0–40.0)

## 2014-08-04 LAB — BASIC METABOLIC PANEL
BUN: 17 mg/dL (ref 6–23)
CO2: 33 mEq/L — ABNORMAL HIGH (ref 19–32)
Calcium: 8.9 mg/dL (ref 8.4–10.5)
Chloride: 101 mEq/L (ref 96–112)
Creatinine, Ser: 0.71 mg/dL (ref 0.40–1.20)
GFR: 85.16 mL/min (ref >60.00)
Glucose, Bld: 94 mg/dL (ref 70–99)
POTASSIUM: 3.3 meq/L — AB (ref 3.5–5.1)
SODIUM: 139 meq/L (ref 135–145)

## 2014-08-04 LAB — HEPATIC FUNCTION PANEL
ALT: 15 U/L (ref 0–35)
AST: 21 U/L (ref 0–37)
Albumin: 3.7 g/dL (ref 3.5–5.2)
Alkaline Phosphatase: 58 U/L (ref 39–117)
Bilirubin, Direct: 0.1 mg/dL (ref 0.0–0.3)
Total Bilirubin: 0.4 mg/dL (ref 0.2–1.2)
Total Protein: 6.3 g/dL (ref 6.0–8.3)

## 2014-08-05 ENCOUNTER — Other Ambulatory Visit: Payer: Self-pay | Admitting: *Deleted

## 2014-08-05 DIAGNOSIS — E876 Hypokalemia: Secondary | ICD-10-CM

## 2014-08-07 ENCOUNTER — Telehealth: Payer: Self-pay | Admitting: Cardiology

## 2014-08-07 NOTE — Telephone Encounter (Signed)
Phone line is busy.  Will try again later.

## 2014-08-07 NOTE — Telephone Encounter (Signed)
Informed patient that I did not call her, but am happy to talk if she has questions.  Patient st she just wanted to confirm lab appointment date and time. Informed patient her labs are scheduled for 5/31 and she can come any time between 0730 and 1700. Patient agrees with treatment plan.

## 2014-08-07 NOTE — Telephone Encounter (Signed)
Follow Up      Pt returning Katy's phone call.

## 2014-08-12 ENCOUNTER — Other Ambulatory Visit (INDEPENDENT_AMBULATORY_CARE_PROVIDER_SITE_OTHER): Payer: PRIVATE HEALTH INSURANCE | Admitting: *Deleted

## 2014-08-12 DIAGNOSIS — E876 Hypokalemia: Secondary | ICD-10-CM

## 2014-08-12 LAB — BASIC METABOLIC PANEL
BUN: 13 mg/dL (ref 6–23)
CALCIUM: 8.9 mg/dL (ref 8.4–10.5)
CO2: 29 meq/L (ref 19–32)
CREATININE: 0.69 mg/dL (ref 0.40–1.20)
Chloride: 104 mEq/L (ref 96–112)
GFR: 88.01 mL/min (ref >60.00)
Glucose, Bld: 90 mg/dL (ref 70–99)
Potassium: 3.8 mEq/L (ref 3.5–5.1)
Sodium: 139 mEq/L (ref 135–145)

## 2014-08-13 ENCOUNTER — Ambulatory Visit: Payer: 59 | Admitting: Cardiothoracic Surgery

## 2014-08-17 ENCOUNTER — Other Ambulatory Visit: Payer: Self-pay | Admitting: Cardiology

## 2014-08-19 ENCOUNTER — Other Ambulatory Visit: Payer: Self-pay | Admitting: Cardiothoracic Surgery

## 2014-08-19 DIAGNOSIS — C3490 Malignant neoplasm of unspecified part of unspecified bronchus or lung: Secondary | ICD-10-CM

## 2014-08-19 DIAGNOSIS — Z952 Presence of prosthetic heart valve: Secondary | ICD-10-CM

## 2014-08-20 ENCOUNTER — Ambulatory Visit
Admission: RE | Admit: 2014-08-20 | Discharge: 2014-08-20 | Disposition: A | Discharge status: Home / Self Care | Payer: PRIVATE HEALTH INSURANCE | Source: Ambulatory Visit | Attending: Cardiothoracic Surgery | Admitting: Cardiothoracic Surgery

## 2014-08-20 ENCOUNTER — Encounter: Payer: Self-pay | Admitting: Cardiothoracic Surgery

## 2014-08-20 ENCOUNTER — Ambulatory Visit (INDEPENDENT_AMBULATORY_CARE_PROVIDER_SITE_OTHER): Payer: PRIVATE HEALTH INSURANCE | Admitting: Cardiothoracic Surgery

## 2014-08-20 VITALS — BP 136/70 | HR 71 | Resp 16 | Ht 60.0 in | Wt 103.0 lb

## 2014-08-20 DIAGNOSIS — I2510 Atherosclerotic heart disease of native coronary artery without angina pectoris: Secondary | ICD-10-CM

## 2014-08-20 DIAGNOSIS — Z952 Presence of prosthetic heart valve: Secondary | ICD-10-CM

## 2014-08-20 DIAGNOSIS — Z9889 Other specified postprocedural states: Secondary | ICD-10-CM

## 2014-08-20 DIAGNOSIS — Z954 Presence of other heart-valve replacement: Secondary | ICD-10-CM

## 2014-08-20 NOTE — Progress Notes (Signed)
PCP is BLISS, Lynnell Jude, MD Referring Provider is Sueanne Margarita, MD  Chief Complaint  Patient presents with  . 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 The patient has hypertension and anxiety. She is doing well her only complaint being the amount of blood pressure medication he is taking in some intermittent mild  dizziness which is not related to posture or orthostatic in nature.no chest pain or ankle edema. Chest x-ray taken today shows resolution of a previous small right pleural effusion.   Past Medical History  Diagnosis Date  . Hypertension   . Abnormal ECG   . Anxiety disorder   . Weight loss   . Chest pain   . Heart murmur   . Anxiety     pt. reports that she is nervous person  . Headache     use to have a lot headaches, relative to her work, but pt. is retired now.    . Coronary artery disease 03/2014    mild non obstructive CAD  . Ascending aortic aneurysm     s/p repair with Bental procedure  . S/P AVR (aortic valve replacement)     tissue valve  . Dyslipidemia, goal LDL below 70 07/26/2014    Past Surgical History  Procedure Laterality Date  . Left and right heart catheterization with coronary angiogram N/A 03/26/2014    Procedure: LEFT AND RIGHT HEART CATHETERIZATION WITH CORONARY ANGIOGRAM;  Surgeon: Rasheema Truluck M Martinique, MD;  Location: [facility] CATH LAB;  Service: Cardiovascular;  Laterality: N/A;  . Tonsillectomy    . Tubal ligation    . Bentall procedure N/A 03/31/2014    Procedure: BIO-BENTALL PROCEDURE using a 70m Magna Ease Aortic Tissue Valve, a 26 mm Terumo Gelweave Valsalva Graft, a 14x10x171mTerumo Gelweave Graft, and a 2850mtraight Hemashield Platinum Graft.;  Surgeon: PetIvin PootD;  [facility] [place];  Laterality: N/A;  . Tee without cardioversion N/A 03/31/2014   Procedure: TRANSESOPHAGEAL ECHOCARDIOGRAM (TEE);  Surgeon: PetIvin PootD;  [facility] [place] [facility];  Laterality: N/A;    Family History  Problem Relation Age of Onset  . Cancer Mother   . Cancer Father   . Cancer Brother     Social History History  Substance Use Topics  . Smoking status: Never Smoker   . Smokeless tobacco: Not on file  . Alcohol Use: Yes     Comment: " I like wine", - irregular consumption - socially has wine      Current Outpatient Prescriptions  Medication Sig Dispense Refill  . aspirin EC 325 MG EC tablet Take 1 tablet (325 mg total) by mouth daily.    . CMarland KitchenRTIA XT 120 MG 24 hr capsule Take 120 mg by mouth daily.  12  . hydrALAZINE (APRESOLINE) 25 MG tablet Take 1 tablet (25 mg total) by mouth 2 (two) times daily. 60 tablet 6  . hydrALAZINE (APRESOLINE) 50 MG tablet Take 1 tablet (50 mg total) by mouth 2 (two) times daily. 60 tablet 6  . hydrochlorothiazide (HYDRODIURIL) 25 MG tablet Take 1 tablet (25 mg total) by mouth daily. 30 tablet 6  . metoprolol succinate (TOPROL-XL) 25 MG 24 hr tablet Take 0.5 tablets (12.5 mg total) by mouth daily. 15 tablet 6  . Multiple  Vitamin (MULTIVITAMIN WITH MINERALS) TABS tablet Take 1 tablet by mouth daily. (Patient taking differently: Take 1 tablet by mouth every other day. )    . potassium chloride SA (K-DUR,KLOR-CON) 20 MEQ tablet Take 20 mEq by mouth every other day.      No current facility-administered medications for this visit.    No Known Allergies  Review of Systems    Review of Systems  General:  No weight loss   no fever   no decreased energy  no night sweats Cardiac:  -Chest pain with exertion - resting chest pain   -SOB with exertion  -Orthopnea                 - PND  ankle edema-  Syncope + dizzy episodes Pulmonary:  no dyspnea,no cough, no productive cough no home oxygen no hemoptysis GI: no difficulty swallowing  no GERD no jaundice  no melena  no hematemesis no         abdominal pain GU:  no dysuria  no hematuria  no frequent UTI no BPH Vascular:  no claudication  No TIA  No varicose veins no DVT Neuro:  no sroke no seizures no TIA no head trauma no vision changes Musculoskeletal:  normal mobility no arthritis  no gout  no joint swelling Skin: no rash  no skin ulceration  no skin cancer Endocrine: diabetes  no thyroid didease Hematologic: no easy bruising  no blood transfusions  no frequent epistaxis ENT : no painful teeth no dentures no loose teeth Psych : no anxiety  no depression  o psych hospitalizations        BP 136/70 mmHg  Pulse 71  Resp 16  Ht 5' (1.524 m)  Wt 103 lb (46.72 kg)  BMI 20.12 kg/m2  SpO2 97% Physical Exam     Physical Exam  General: small thin elderly Caucasian female no acute distress alert and oriented HEENT: Normocephalic pupils equal , dentition adequate Neck: Supple without JVD, adenopathy, or bruit Chest: Clear to auscultation, symmetrical breath sounds, no rhonchi, no tenderness             or deformity Cardiovascular: Regular rate and rhythm, no murmur, no gallop, peripheral pulses             palpable in all extremities-left radial pulse diminished compared to right radial pulse-baseline Abdomen:  Soft, nontender, no palpable mass or organomegaly Extremities: Warm, well-perfused, no clubbing cyanosis edema or tenderness,              no venous stasis changes of the legs Rectal/GU: Deferred Neuro: Grossly non--focal and symmetrical throughout Skin: Clean and dry without rash or ulceration   Diagnostic Tests: Chest x-ray today shows normal cardiac silhouette, resolution of previous mild right pleural effusion  Impression: Excellent recovery after urgent aVR and total ascending aorta and aortic arch replacement 6 months ago. The patient understands the importance of taking her blood pressure medication. I will see her back in 6 months with CTA of her thoracic aorta to review the repair and the status of her  descending thoracic aorta.  Plan:return in 6 months for CTA of the thoracic aorta. Continue all current blood pressure medications. Bring to next office visit the home record of blood pressure readings.   Len Childs, MD [facility] [phone]

## 2014-11-04 ENCOUNTER — Telehealth: Payer: Self-pay | Admitting: Cardiology

## 2014-11-04 NOTE — Telephone Encounter (Signed)
BPs are actually ok.  Please have her see her PCP in regards to dizziness

## 2014-11-04 NOTE — Telephone Encounter (Signed)
Patient c/o dizziness about one hour after taking her morning medications (she takes all BP meds in the AM).  Patient checks her BP twice daily. This week's readings: AM: 142/60 PM: 156/73 AM: 137/69  Last time the patient checked her BP during a dizzy spell, her blood pressure went from 141/65 (pre-meds) to 131/58 post medication. The patient is concerned, thinking her BP is dropping too quickly. Told her that the drop in BP is not so worrisome, but her dizziness is. Informed the patient she will be called with Dr. Theodosia Blender recommendations.

## 2014-11-04 NOTE — Telephone Encounter (Signed)
Informed patient that since her BP is fine, Dr. Radford Pax would like for her to see her PCP in regards to her dizziness. Patient agrees with treatment plan.

## 2014-11-04 NOTE — Telephone Encounter (Signed)
New message    Pt c/o medication issue:  1. Name of Medication: Hydralazine  2. How are you currently taking this medication (dosage and times per day)? 150 mg a day  3. Are you having a reaction (difficulty breathing--STAT)? No  4. What is your medication issue? About 1 hour after taking medication, pt states she feels lightheaded and dizzy  Please call to discuss

## 2014-11-05 ENCOUNTER — Telehealth: Payer: Self-pay | Admitting: Cardiology

## 2014-11-05 NOTE — Telephone Encounter (Signed)
Fine with me to switch.  Peter Martinique MD, [facility]

## 2014-11-05 NOTE — Telephone Encounter (Signed)
Patient called to request to change physicians to Dr. Martinique. The office is closer to her home and more convenient for rides.   To Dr. Radford Pax and Dr. Martinique.

## 2014-11-05 NOTE — Telephone Encounter (Signed)
New Message  Pt called to speak w/ Valetta Fuller concerning change in care. Please call back and discuss.

## 2014-11-05 NOTE — Telephone Encounter (Signed)
Fine with me

## 2014-11-06 NOTE — Telephone Encounter (Signed)
Encounter given to schedulers to make appointment with Dr. Martinique.

## 2014-11-12 ENCOUNTER — Telehealth: Payer: Self-pay | Admitting: Cardiology

## 2014-11-12 NOTE — Telephone Encounter (Signed)
Patient irritated because she was told by the [facility] office that Dr. Radford Pax "hasn't done her job" to switch to Dr. Martinique. Informed the patient that the encounter is complete and to call the [place] office to schedule an appointment.   Message sent to scheduling at [facility].

## 2014-11-12 NOTE — Telephone Encounter (Signed)
New message    Patient calling did not want to disclose any information - will like to speak with nurse

## 2014-12-24 ENCOUNTER — Ambulatory Visit (INDEPENDENT_AMBULATORY_CARE_PROVIDER_SITE_OTHER): Payer: PRIVATE HEALTH INSURANCE | Admitting: Cardiology

## 2014-12-24 ENCOUNTER — Encounter: Payer: Self-pay | Admitting: Cardiology

## 2014-12-24 VITALS — BP 184/90 | HR 62 | Ht 60.0 in | Wt 109.5 lb

## 2014-12-24 DIAGNOSIS — Z952 Presence of prosthetic heart valve: Secondary | ICD-10-CM

## 2014-12-24 DIAGNOSIS — I712 Thoracic aortic aneurysm, without rupture, unspecified: Secondary | ICD-10-CM

## 2014-12-24 DIAGNOSIS — Z954 Presence of other heart-valve replacement: Secondary | ICD-10-CM | POA: Diagnosis not present

## 2014-12-24 DIAGNOSIS — I10 Essential (primary) hypertension: Secondary | ICD-10-CM | POA: Diagnosis not present

## 2014-12-24 MED ORDER — DILTIAZEM HCL ER COATED BEADS 240 MG PO CP24: 240.0000 mg | ORAL_CAPSULE | Freq: Every day | ORAL | Status: DC

## 2014-12-24 NOTE — Progress Notes (Signed)
Cardiology Office Note   Date:  12/24/2014   ID:  Yvonne Ballard, DOB 1940-04-27, MRN [id number]  PCP:  Lynnell Jude, MD  Cardiologist:   Peter Martinique, MD   Chief Complaint  Patient presents with  . Hypertension  . Aneurysm      History of Present Illness: Yvonne Ballard is a 74 y.o. female who presents for follow up of HTN and aortic disease. She has previously been followed by Dr. Radford Pax. 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 At the time she had mild nonobstructive CAD and normal right heart pressures. EF 45%. She underwent surgery by Dr. Prescott Gum with a Bentall procedure with aortic root and total arch replacement and AV replacement with a #23 mm Magna Ease tissue AV. She also had aorto-brachiocephalic bypass and aorto-left common carotid bypass. Repeat Echo in 2/16 showed normal LV function and good AV function. Post op course was complicated by C. Diff colitis and a pleural  Effusion requiring thoracentesis.   On follow up today she notes that her blood pressure has been labile-typically running in the 160s. She previously was on a higher dose of metoprolol but this caused fatigue and was reduced. She was placed on hydralazine but this caused her to feel dizzy. She was on lasix post op but this was discontinued. She has a history of cough on ACEi. Overall she is doing well and denies chest pain, SOB or fatigue. She states she gets nervous a lot.   Past Medical History  Diagnosis Date  . Hypertension   . Abnormal ECG   . Anxiety disorder   . Weight loss   . Chest pain   . Heart murmur   . Anxiety     pt. reports that she is nervous person  . Headache     use to have a lot headaches, relative to her work, but pt. is retired now.    . Coronary artery disease 03/2014    mild non obstructive CAD  . Ascending aortic aneurysm [facility])     s/p repair with Bental procedure  . S/P AVR (aortic valve replacement)    tissue valve  . Dyslipidemia, goal LDL below 70 07/26/2014  . PMR (polymyalgia rheumatica) [facility])     Past Surgical History  Procedure Laterality Date  . Left and right heart catheterization with coronary angiogram N/A 03/26/2014    Procedure: LEFT AND RIGHT HEART CATHETERIZATION WITH CORONARY ANGIOGRAM;  Surgeon: Peter M Martinique, MD;  Location: [facility] CATH LAB;  Service: Cardiovascular;  Laterality: N/A;  . Tonsillectomy    . Tubal ligation    . Bentall procedure N/A 03/31/2014    Procedure: BIO-BENTALL PROCEDURE using a 54m Magna Ease Aortic Tissue Valve, a 26 mm Terumo Gelweave Valsalva Graft, a 14x10x141mTerumo Gelweave Graft, and a 2820mtraight Hemashield Platinum Graft.;  Surgeon: PetIvin PootD;  [facility] [place];  Laterality: N/A;  . Tee without cardioversion N/A 03/31/2014    Procedure: TRANSESOPHAGEAL ECHOCARDIOGRAM (TEE);  Surgeon: PetIvin PootD;  [facility] [place];  Laterality: N/A;     Current Outpatient Prescriptions  Medication Sig Dispense Refill  . aspirin EC 81 MG tablet Take 81 mg by mouth daily.    . CMarland KitchenRTIA XT 120 MG 24 hr capsule Take 120 mg by mouth daily.  12  . metoprolol succinate (TOPROL-XL) 25 MG 24 hr tablet  Take 0.5 tablets (12.5 mg total) by mouth daily. 15 tablet 6  . Multiple Vitamin (MULTIVITAMIN WITH MINERALS) TABS tablet Take 1 tablet by mouth daily. (Patient taking differently: Take 1 tablet by mouth every other day. )    . potassium chloride SA (K-DUR,KLOR-CON) 20 MEQ tablet Take 20 mEq by mouth every other day.     . diltiazem (CARTIA XT) 240 MG 24 hr capsule Take 1 capsule (240 mg total) by mouth daily. 30 capsule 11   No current facility-administered medications for this visit.    Allergies:   Review of patient's allergies indicates no known allergies.    Social History:  The patient  reports that she has never smoked. She does not have any smokeless tobacco history on file. She  reports that she drinks alcohol. She reports that she does not use illicit drugs.   Family History:  The patient's family history includes Cancer in her brother, father, and mother.    ROS:  Please see the history of present illness.   Otherwise, review of systems are positive for none.   All other systems are reviewed and negative.    PHYSICAL EXAM: VS:  BP 184/90 mmHg  Pulse 62  Ht 5' (1.524 m)  Wt 49.669 kg (109 lb 8 oz)  BMI 21.39 kg/m2  SpO2 98% , BMI Body mass index is 21.39 kg/(m^2). GEN: Well nourished, thin WF, in no acute distress HEENT: normal Neck: no JVD, carotid bruits, or masses Cardiac: RRR; normal R6-7, gr 8-9/3 systolic ejection murmur RUSB.  Respiratory:  clear to auscultation bilaterally, normal work of breathing GI: soft, nontender, nondistended, + BS MS: no deformity or atrophy Skin: warm and dry, no rash Neuro:  Strength and sensation are intact Psych: euthymic mood, full affect   EKG:  EKG is not ordered today. The ekg ordered today demonstrates N/A   Recent Labs: 04/06/2014: Magnesium 1.8; TSH 3.073 04/19/2014: B Natriuretic Peptide 560.9*; Hemoglobin 12.9; Platelets 304 08/04/2014: ALT 15 08/12/2014: BUN 13; Creatinine, Ser 0.69; Potassium 3.8; Sodium 139    Lipid Panel    Component Value Date/Time   CHOL 226* 08/04/2014 0938   TRIG 110.0 08/04/2014 0938   HDL 65.90 08/04/2014 0938   CHOLHDL 3 08/04/2014 0938   VLDL 22.0 08/04/2014 0938   LDLCALC 138* 08/04/2014 0938      Wt Readings from Last 3 Encounters:  12/24/14 49.669 kg (109 lb 8 oz)  08/20/14 46.72 kg (103 lb)  07/28/14 46.72 kg (103 lb)      Other studies Reviewed: Additional studies/ records that were reviewed today include: none. Review of the above records demonstrates: /   ASSESSMENT AND PLAN:  1.  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 S/p aortic root and total aortic arch grafting and AVR with  Tissue AVR. Followed by Dr. Prescott Gum with plans  for follow up CT. 2. Labile HTN. Several drug intolerances. Initial BP today 144/82. Repeat 185/90. I have recommended increasing Cartia XT to 240 mg daily. Follow up in 3-4 months. She is to bring BP cuff with her to check. If BP is still uncontrolled consider an ARB.  3. Dyslipidemia 4. Nonobstructive CAD.  Current medicines are reviewed at length with the patient today.  The patient does not have concerns regarding medicines.  The following changes have been made:  See above  Labs/ tests ordered today include: none  No orders of the defined types were placed in this encounter.     Disposition:  FU with Dr. Martinique  in 3 months  Signed, Peter Martinique, MD  12/24/2014 7:18 PM    [facility] [address], [place], [place], [address] Phone [phone], Fax [phone]

## 2014-12-24 NOTE — Patient Instructions (Addendum)
Lets increase Cartia XT to 240 mg daily   Continue your other therapy  I will see you in 3-4 months.non

## 2015-02-16 ENCOUNTER — Ambulatory Visit: Payer: PRIVATE HEALTH INSURANCE | Admitting: Cardiology

## 2015-03-11 ENCOUNTER — Other Ambulatory Visit: Payer: Self-pay | Admitting: *Deleted

## 2015-03-11 DIAGNOSIS — I712 Thoracic aortic aneurysm, without rupture, unspecified: Secondary | ICD-10-CM

## 2015-03-12 ENCOUNTER — Other Ambulatory Visit: Payer: Self-pay | Admitting: *Deleted

## 2015-03-12 DIAGNOSIS — I712 Thoracic aortic aneurysm, without rupture, unspecified: Secondary | ICD-10-CM

## 2015-04-01 ENCOUNTER — Ambulatory Visit: Payer: PRIVATE HEALTH INSURANCE | Admitting: Cardiothoracic Surgery

## 2015-04-01 ENCOUNTER — Other Ambulatory Visit: Payer: PRIVATE HEALTH INSURANCE

## 2015-04-14 ENCOUNTER — Other Ambulatory Visit: Payer: Self-pay | Admitting: Cardiothoracic Surgery

## 2015-04-14 DIAGNOSIS — Z952 Presence of prosthetic heart valve: Secondary | ICD-10-CM

## 2015-04-15 ENCOUNTER — Ambulatory Visit: Payer: Self-pay | Admitting: Cardiothoracic Surgery

## 2015-04-22 ENCOUNTER — Ambulatory Visit: Payer: Self-pay | Admitting: Cardiothoracic Surgery

## 2015-04-27 ENCOUNTER — Other Ambulatory Visit: Payer: Self-pay | Admitting: *Deleted

## 2015-04-27 ENCOUNTER — Encounter: Payer: Self-pay | Admitting: Cardiology

## 2015-04-27 ENCOUNTER — Ambulatory Visit (INDEPENDENT_AMBULATORY_CARE_PROVIDER_SITE_OTHER): Payer: PRIVATE HEALTH INSURANCE | Admitting: Cardiology

## 2015-04-27 VITALS — BP 200/90 | HR 62 | Ht 61.0 in | Wt 112.2 lb

## 2015-04-27 DIAGNOSIS — I712 Thoracic aortic aneurysm, without rupture, unspecified: Secondary | ICD-10-CM

## 2015-04-27 DIAGNOSIS — Z952 Presence of prosthetic heart valve: Secondary | ICD-10-CM

## 2015-04-27 DIAGNOSIS — Z954 Presence of other heart-valve replacement: Secondary | ICD-10-CM | POA: Diagnosis not present

## 2015-04-27 DIAGNOSIS — I119 Hypertensive heart disease without heart failure: Secondary | ICD-10-CM | POA: Diagnosis not present

## 2015-04-27 DIAGNOSIS — E785 Hyperlipidemia, unspecified: Secondary | ICD-10-CM | POA: Diagnosis not present

## 2015-04-27 MED ORDER — IRBESARTAN 150 MG PO TABS: 150.0000 mg | ORAL_TABLET | Freq: Every day | ORAL | Status: DC

## 2015-04-27 NOTE — Progress Notes (Signed)
Cardiology Office Note   Date:  04/27/2015   ID:  Yvonne Ballard, DOB [birth year], MRN [id number]  PCP:  Lynnell Jude, MD  Cardiologist:   Bayan Hedstrom Martinique, MD   Chief Complaint  Patient presents with  . Follow-up     no chest pain, no shortness of breath, no edema, no pain or cramping in legs, has lightheadedness or dizziness      History of Present Illness: Yvonne Ballard is a 75 y.o. female who presents for follow up of HTN and aortic disease.  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 At the time she had mild nonobstructive CAD and normal right heart pressures. EF 45%. She underwent surgery by Dr. Prescott Gum with a Bentall procedure with aortic root and total arch replacement and AV replacement with a #23 mm Magna Ease tissue AV. She also had aorto-brachiocephalic bypass and aorto-left common carotid bypass. Repeat Echo in 2/16 showed normal LV function and good AV function. Post op course was complicated by C. Diff colitis and a pleural effusion requiring thoracentesis.   Since her surgery she has poor blood pressure control. On her last visit we increased her Cardizem. She stopped taking metoprolol on her own. History of cough on ACEi. Metoprolol made her fatigued and hydralaziine made her dizzy. Was scheduled for CTA of aorta in Dec by Dr. Prescott Gum but she states she doesn't want this done and is getting just a CXR instead. Reports BP typically 416-606 systolic.   Past Medical History  Diagnosis Date  . Hypertension   . Abnormal ECG   . Anxiety disorder   . Weight loss   . Chest pain   . Heart murmur   . Anxiety     pt. reports that she is nervous person  . Headache     use to have a lot headaches, relative to her work, but pt. is retired now.    . Coronary artery disease 03/2014    mild non obstructive CAD  . Ascending aortic aneurysm [facility])     s/p repair with Bental procedure  . S/P AVR (aortic valve  replacement)     tissue valve  . Dyslipidemia, goal LDL below 70 07/26/2014  . PMR (polymyalgia rheumatica) [facility])     Past Surgical History  Procedure Laterality Date  . Left and right heart catheterization with coronary angiogram N/A 03/26/2014    Procedure: LEFT AND RIGHT HEART CATHETERIZATION WITH CORONARY ANGIOGRAM;  Surgeon: Buffy Ehler M Martinique, MD;  Location: [facility] CATH LAB;  Service: Cardiovascular;  Laterality: N/A;  . Tonsillectomy    . Tubal ligation    . Bentall procedure N/A 03/31/2014    Procedure: BIO-BENTALL PROCEDURE using a 37m Magna Ease Aortic Tissue Valve, a 26 mm Terumo Gelweave Valsalva Graft, a 14x10x166mTerumo Gelweave Graft, and a 2831mtraight Hemashield Platinum Graft.;  Surgeon: PetIvin PootD;  [facility] [place];  Laterality: N/A;  . Tee without cardioversion N/A 03/31/2014    Procedure: TRANSESOPHAGEAL ECHOCARDIOGRAM (TEE);  Surgeon: PetIvin PootD;  [facility] [place] [place] [facility];  Laterality: N/A;     Current Outpatient Prescriptions  Medication Sig Dispense Refill  . aspirin EC 81 MG tablet Take 81 mg by mouth daily.    . Multiple Vitamin (MULTIVITAMIN WITH MINERALS) TABS tablet Take 1 tablet by mouth daily. (Patient taking differently: Take 1 tablet by mouth every other  day. )    . diltiazem (CARTIA XT) 240 MG 24 hr capsule Take 1 capsule (240 mg total) by mouth daily. 30 capsule 11  . irbesartan (AVAPRO) 150 MG tablet Take 1 tablet (150 mg total) by mouth daily. 90 tablet 3   No current facility-administered medications for this visit.    Allergies:   Review of patient's allergies indicates no known allergies.    Social History:  The patient  reports that she has never smoked. She does not have any smokeless tobacco history on file. She reports that she drinks alcohol. She reports that she does not use illicit drugs.   Family History:  The patient's family history includes Cancer in her brother, father,  and mother.    ROS:  Please see the history of present illness.   Otherwise, review of systems are positive for none.   All other systems are reviewed and negative.    PHYSICAL EXAM: VS:  BP 200/90 mmHg  Pulse 62  Ht '5\' 1"'$  (1.549 m)  Wt 50.916 kg (112 lb 4 oz)  BMI 21.22 kg/m2 , BMI Body mass index is 21.22 kg/(m^2). GEN: Well nourished, thin WF, in no acute distress HEENT: normal Neck: no JVD, carotid bruits, or masses Cardiac: RRR; normal S0-6, gr 3-0/1 systolic ejection murmur RUSB.  Respiratory:  clear to auscultation bilaterally, normal work of breathing GI: soft, nontender, nondistended, + BS MS: no deformity or atrophy Skin: warm and dry, no rash Neuro:  Strength and sensation are intact Psych: euthymic mood, full affect   EKG:  EKG is ordered today. The ekg ordered today demonstrates NSR with rate 62. RBBB. I have personally reviewed and interpreted this study.    Recent Labs: 08/04/2014: ALT 15 08/12/2014: BUN 13; Creatinine, Ser 0.69; Potassium 3.8; Sodium 139    Lipid Panel    Component Value Date/Time   CHOL 226* 08/04/2014 0938   TRIG 110.0 08/04/2014 0938   HDL 65.90 08/04/2014 0938   CHOLHDL 3 08/04/2014 0938   VLDL 22.0 08/04/2014 0938   LDLCALC 138* 08/04/2014 0938      Wt Readings from Last 3 Encounters:  04/27/15 50.916 kg (112 lb 4 oz)  12/24/14 49.669 kg (109 lb 8 oz)  08/20/14 46.72 kg (103 lb)      Other studies Reviewed: Additional studies/ records that were reviewed today include: none. Review of the above records demonstrates: /   ASSESSMENT AND PLAN:  1.  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 S/p aortic root and total aortic arch grafting and AVR with  Tissue AVR. Clinically doing well and asymptomatic.  2. Labile HTN. Several drug intolerances. Poorly controlled. Will add Irbesartan 150 mg daily to Cardizem. Repeat chemistries in 2-3 weeks. Follow up in 3 months.  3. Dyslipidemia- check fasting lab  work. 4. Nonobstructive CAD.  Current medicines are reviewed at length with the patient today.  The patient does not have concerns regarding medicines.  The following changes have been made:  See above  Labs/ tests ordered today include: none   Orders Placed This Encounter  Procedures  . Basic metabolic panel  . Lipid panel  . Hepatic function panel  . EKG 12-Lead     Disposition:   FU with Dr. Martinique  in 3 months  Signed, Prathik Aman Martinique, MD  04/27/2015 5:29 PM    [facility] [address], [place], [place], [address] Phone [phone], Fax [phone]

## 2015-04-27 NOTE — Patient Instructions (Signed)
Continue your current medication except stop metoprolol and potassium  Add irbesartan 150 mg daily for blood pressure.  We will check lab work in 2-3 weeks.  I will see you in 3 months.

## 2015-04-29 ENCOUNTER — Encounter: Payer: Self-pay | Admitting: Cardiothoracic Surgery

## 2015-04-29 ENCOUNTER — Ambulatory Visit
Admission: RE | Admit: 2015-04-29 | Discharge: 2015-04-29 | Disposition: A | Discharge status: Home / Self Care | Payer: PRIVATE HEALTH INSURANCE | Source: Ambulatory Visit | Attending: Cardiothoracic Surgery | Admitting: Cardiothoracic Surgery

## 2015-04-29 ENCOUNTER — Ambulatory Visit (INDEPENDENT_AMBULATORY_CARE_PROVIDER_SITE_OTHER): Payer: PRIVATE HEALTH INSURANCE | Admitting: Cardiothoracic Surgery

## 2015-04-29 VITALS — BP 180/76 | HR 64 | Resp 20 | Ht 61.0 in | Wt 112.0 lb

## 2015-04-29 DIAGNOSIS — I712 Thoracic aortic aneurysm, without rupture, unspecified: Secondary | ICD-10-CM

## 2015-04-29 DIAGNOSIS — Z9889 Other specified postprocedural states: Secondary | ICD-10-CM

## 2015-04-29 DIAGNOSIS — Z954 Presence of other heart-valve replacement: Secondary | ICD-10-CM

## 2015-04-29 DIAGNOSIS — Z952 Presence of prosthetic heart valve: Secondary | ICD-10-CM

## 2015-04-29 DIAGNOSIS — I7121 Aneurysm of the ascending aorta, without rupture: Secondary | ICD-10-CM

## 2015-04-29 DIAGNOSIS — I10 Essential (primary) hypertension: Secondary | ICD-10-CM

## 2015-04-29 DIAGNOSIS — I351 Nonrheumatic aortic (valve) insufficiency: Secondary | ICD-10-CM

## 2015-04-29 NOTE — Progress Notes (Signed)
PCP is BLISS, Lynnell Jude, MD Referring Provider is Sueanne Margarita, MD  Chief Complaint  Patient presents with  . 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 Patient has severe hypertension and her blood pressure medications are being adjusted and altered by her cardiologist Dr. Martinique. When he saw the patient in his office earlier this week systolic blood pressure was 200. Avapro was added to her Cardizem. Systolic blood pressure today in the office is 175 on the right arm.  The patient's short-term memory is much improved. She still has episodes of dizziness which she relates is not related to postural or orthostatic changes. She has not had a syncopal episode. She has not fallen.a postoperative echocardiogram has shown normal LV function, normal functioning aortic valve.  Today the patient had a chest x-ray which shows clear lung fields and a stable mediastinal-cardiac silhouette. No pleural effusion. The patient refused a CTA scan.  Past Medical History  Diagnosis Date  . Hypertension   . Abnormal ECG   . Anxiety disorder   . Weight loss   . Chest pain   . Heart murmur   . Anxiety     pt. reports that she is nervous person  . Headache     use to have a lot headaches, relative to her work, but pt. is retired now.    . Coronary artery disease 03/2014    mild non obstructive CAD  . Ascending aortic aneurysm [facility])     s/p repair with Bental procedure  . S/P AVR (aortic valve replacement)     tissue valve  . Dyslipidemia, goal LDL below 70 07/26/2014  . PMR (polymyalgia rheumatica) [facility])     Past Surgical History  Procedure Laterality Date  . Left and right heart catheterization with coronary angiogram N/A 03/26/2014    Procedure: LEFT AND RIGHT HEART CATHETERIZATION WITH CORONARY ANGIOGRAM;  Surgeon: Peter M Martinique, MD;   Location: [facility] CATH LAB;  Service: Cardiovascular;  Laterality: N/A;  . Tonsillectomy    . Tubal ligation    . Bentall procedure N/A 03/31/2014    Procedure: BIO-BENTALL PROCEDURE using a 79m Magna Ease Aortic Tissue Valve, a 26 mm Terumo Gelweave Valsalva Graft, a 14x10x163mTerumo Gelweave Graft, and a 2880mtraight Hemashield Platinum Graft.;  Surgeon: PetIvin PootD;  [facility] [place];  Laterality: N/A;  . Tee without cardioversion N/A 03/31/2014    Procedure: TRANSESOPHAGEAL ECHOCARDIOGRAM (TEE);  Surgeon: PetIvin PootD;  [facility] [place];  Laterality: N/A;    Family History  Problem Relation Age of Onset  . Cancer Mother   . Cancer Father   . Cancer Brother     Social History Social History  Substance Use Topics  . Smoking status: Never Smoker   . Smokeless tobacco: None  . Alcohol Use: Yes     Comment: " I like wine", - irregular consumption - socially has wine      Current Outpatient Prescriptions  Medication Sig Dispense Refill  . aspirin EC 81 MG tablet Take 81 mg by mouth daily.    . dMarland Kitchenltiazem (CARTIA XT) 240 MG 24 hr capsule Take 1 capsule (240 mg total) by mouth daily. 30 capsule 11  . irbesartan (AVAPRO) 150 MG tablet Take  1 tablet (150 mg total) by mouth daily. 90 tablet 3  . Multiple Vitamin (MULTIVITAMIN WITH MINERALS) TABS tablet Take 1 tablet by mouth daily. (Patient taking differently: Take 1 tablet by mouth every other day. )     No current facility-administered medications for this visit.    No Known Allergies  Review of Systems   Dizziness but no syncope or false No visual field problems Weight has been stable No cough or hoarseness No abdominal pain No pedal edema No dyspnea exertion No orthopnea No palpitations  Positivehistory of anxiety disorder  BP 180/76 mmHg  Pulse 64  Resp 20  Ht '5\' 1"'$  (1.549 m)  Wt 112 lb (50.803 kg)  BMI 21.17 kg/m2  SpO2 97% Physical Exam Alert  and comfortable accompanied by her daughter Jinny Blossom Heart rhythm regular, no gallop Soft systolic flow murmur through prosthetic aortic valve without diastolic murmur Lungs clear Good carotid pulses bilaterally Left radial pulse less than right radial pulse No focal motor deficit No peripheral edema  Diagnostic Tests: Chest x-ray personally reviewed which shows a stable mediastinal contour  Impression: Successful repair of aortic root and arch aneurysm with aVR Severe hypertension Anxiety disorder  Plan: The patient will be compliant with her blood pressure medications --she understands the importance of blood pressure control to prevent stroke or further vascular problems such as dissection or pseudoaneurysm formation.  She she can return to this office as needed for future cardiac surgical issues.  Len Childs, MD [facility] [phone]

## 2015-05-19 ENCOUNTER — Other Ambulatory Visit: Payer: Self-pay

## 2015-05-19 ENCOUNTER — Telehealth: Payer: Self-pay | Admitting: Cardiology

## 2015-05-19 DIAGNOSIS — I10 Essential (primary) hypertension: Secondary | ICD-10-CM

## 2015-05-19 DIAGNOSIS — E785 Hyperlipidemia, unspecified: Secondary | ICD-10-CM

## 2015-05-19 LAB — BASIC METABOLIC PANEL
BUN: 10 mg/dL (ref 7–25)
CHLORIDE: 104 mmol/L (ref 98–110)
CO2: 29 mmol/L (ref 20–31)
Calcium: 9.1 mg/dL (ref 8.6–10.4)
Creat: 0.66 mg/dL (ref 0.60–0.93)
Glucose, Bld: 96 mg/dL (ref 65–99)
POTASSIUM: 4.5 mmol/L (ref 3.5–5.3)
Sodium: 142 mmol/L (ref 135–146)

## 2015-05-19 LAB — LIPID PANEL
Cholesterol: 232 mg/dL — ABNORMAL HIGH (ref 125–200)
HDL: 79 mg/dL (ref >=46)
LDL CALC: 138 mg/dL — AB (ref <130)
TRIGLYCERIDES: 73 mg/dL (ref <150)
Total CHOL/HDL Ratio: 2.9 Ratio (ref <=5.0)
VLDL: 15 mg/dL (ref <30)

## 2015-05-19 LAB — HEPATIC FUNCTION PANEL
ALBUMIN: 4 g/dL (ref 3.6–5.1)
ALT: 10 U/L (ref 6–29)
AST: 17 U/L (ref 10–35)
Alkaline Phosphatase: 76 U/L (ref 33–130)
Bilirubin, Direct: 0.1 mg/dL (ref <=0.2)
Indirect Bilirubin: 0.4 mg/dL (ref 0.2–1.2)
TOTAL PROTEIN: 6.2 g/dL (ref 6.1–8.1)
Total Bilirubin: 0.5 mg/dL (ref 0.2–1.2)

## 2015-05-19 NOTE — Telephone Encounter (Signed)
Returned call to patient lab results given. 

## 2015-05-19 NOTE — Telephone Encounter (Signed)
She said she was returning call,she think it was about her results.

## 2015-05-19 NOTE — Telephone Encounter (Signed)
Follow up ° ° ° ° ° °Returning a call to the nurse °

## 2015-05-22 ENCOUNTER — Telehealth: Payer: Self-pay | Admitting: Cardiology

## 2015-05-22 ENCOUNTER — Encounter: Payer: Self-pay | Admitting: *Deleted

## 2015-05-22 NOTE — Telephone Encounter (Signed)
New message   Pt wants rn to call her about the paper work that you sent her  It was incorrect she wants the results printed out 05-20-15

## 2015-05-22 NOTE — Telephone Encounter (Signed)
Patient is requesting a copy of recent labs from March 2017. Patient states she received lab slip for upcoming labs for 3 months from now. Mailed to patient.

## 2015-06-30 ENCOUNTER — Telehealth: Payer: Self-pay | Admitting: Cardiology

## 2015-06-30 NOTE — Addendum Note (Signed)
Addended by: Kathyrn Lass on: 06/30/2015 03:56 PM   Modules accepted: Orders, Medications

## 2015-06-30 NOTE — Telephone Encounter (Signed)
Please call,concerning changing her Irbesartan.

## 2015-06-30 NOTE — Telephone Encounter (Signed)
Received call back from patient.Dr.Jordan advised decrease Irbesartan to 75 mg daily.Advised to call back if symptoms do not improve.

## 2015-06-30 NOTE — Telephone Encounter (Signed)
Returned call to patient no answer.LMTC. 

## 2015-06-30 NOTE — Telephone Encounter (Signed)
She has multiple drug intolerances as noted in my last note. I would have her reduce dose to 75 mg daily and see if symptoms improve.  Williemae Muriel Martinique MD, [facility]

## 2015-06-30 NOTE — Telephone Encounter (Signed)
Returned call to patient she stated since she started taking Irbesartan 150 mg daily she has been having chest tightness and dizziness.Advised I will send message to [place] for advice.

## 2015-07-30 ENCOUNTER — Encounter: Payer: Self-pay | Admitting: Cardiology

## 2015-07-30 ENCOUNTER — Ambulatory Visit (INDEPENDENT_AMBULATORY_CARE_PROVIDER_SITE_OTHER): Payer: PRIVATE HEALTH INSURANCE | Admitting: Cardiology

## 2015-07-30 VITALS — BP 150/70 | HR 83 | Ht 60.0 in | Wt 112.0 lb

## 2015-07-30 DIAGNOSIS — Z954 Presence of other heart-valve replacement: Secondary | ICD-10-CM

## 2015-07-30 DIAGNOSIS — I119 Hypertensive heart disease without heart failure: Secondary | ICD-10-CM

## 2015-07-30 DIAGNOSIS — I712 Thoracic aortic aneurysm, without rupture, unspecified: Secondary | ICD-10-CM

## 2015-07-30 DIAGNOSIS — I10 Essential (primary) hypertension: Secondary | ICD-10-CM

## 2015-07-30 DIAGNOSIS — Z952 Presence of prosthetic heart valve: Secondary | ICD-10-CM

## 2015-07-30 NOTE — Progress Notes (Signed)
Cardiology Office Note   Date:  07/30/2015   ID:  Yvonne Ballard, DOB [birth year], MRN [id number]  PCP:  Lynnell Jude, MD  Cardiologist:   Keigo Whalley Martinique, MD   Chief Complaint  Patient presents with  . 3 MONTHS  . Dizziness      History of Present Illness: Yvonne Ballard is a 75 y.o. female who presents for follow up of HTN and aortic disease.  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 At the time she had mild nonobstructive CAD and normal right heart pressures. EF 45%. She underwent surgery by Dr. Prescott Gum with a Bentall procedure with aortic root and total arch replacement and AV replacement with a #23 mm Magna Ease tissue AV. She also had aorto-brachiocephalic bypass and aorto-left common carotid bypass. Repeat Echo in 2/16 showed normal LV function and good AV function.  Since her surgery she has had difficult to control blood pressure. We last added irbesartan but at 150 mg she had a lot of dizziness so this was reduced to 75 mg daily. She is tolerating this well. BP readings at home are similar to todays reading.  History of cough on ACEi. Metoprolol made her fatigued and hydralaziine made her dizzy.  She does note a sensitivity to too much neural input. Things like bright lights and mirrors makes her feel very anxious and she has to find a quiet place to sit down. She takes Xanax with relief. She was offered Zoloft by Dr. Clemmie Krill but she doesn't want to take additional medication.   Past Medical History  Diagnosis Date  . Hypertension   . Abnormal ECG   . Anxiety disorder   . Weight loss   . Chest pain   . Heart murmur   . Anxiety     pt. reports that she is nervous person  . Headache     use to have a lot headaches, relative to her work, but pt. is retired now.    . Coronary artery disease 03/2014    mild non obstructive CAD  . Ascending aortic aneurysm [facility])     s/p repair with Bental procedure  . S/P AVR  (aortic valve replacement)     tissue valve  . Dyslipidemia, goal LDL below 70 07/26/2014  . PMR (polymyalgia rheumatica) [facility])     Past Surgical History  Procedure Laterality Date  . Left and right heart catheterization with coronary angiogram N/A 03/26/2014    Procedure: LEFT AND RIGHT HEART CATHETERIZATION WITH CORONARY ANGIOGRAM;  Surgeon: Tonni Mansour M Martinique, MD;  Location: [facility] CATH LAB;  Service: Cardiovascular;  Laterality: N/A;  . Tonsillectomy    . Tubal ligation    . Bentall procedure N/A 03/31/2014    Procedure: BIO-BENTALL PROCEDURE using a 51m Magna Ease Aortic Tissue Valve, a 26 mm Terumo Gelweave Valsalva Graft, a 14x10x179mTerumo Gelweave Graft, and a 28101mtraight Hemashield Platinum Graft.;  Surgeon: PetIvin PootD;  [facility] [place];  Laterality: N/A;  . Tee without cardioversion N/A 03/31/2014    Procedure: TRANSESOPHAGEAL ECHOCARDIOGRAM (TEE);  Surgeon: PetIvin PootD;  [facility] [place];  Laterality: N/A;     Current Outpatient Prescriptions  Medication Sig Dispense Refill  . ALPRAZolam (XANAX PO) Take 10 mg by mouth as needed.    . aMarland Kitchenpirin EC 81 MG tablet Take 81 mg by mouth daily.    .Marland Kitchen  diltiazem (CARTIA XT) 240 MG 24 hr capsule Take 1 capsule (240 mg total) by mouth daily. 30 capsule 11  . irbesartan (AVAPRO) 75 MG tablet Take 1 tablet (75 mg total) by mouth daily. 30 tablet 6  . Multiple Vitamin (MULTIVITAMIN WITH MINERALS) TABS tablet Take 1 tablet by mouth daily. (Patient taking differently: Take 1 tablet by mouth every other day. )     No current facility-administered medications for this visit.    Allergies:   Review of patient's allergies indicates no known allergies.    Social History:  The patient  reports that she has never smoked. She does not have any smokeless tobacco history on file. She reports that she drinks alcohol. She reports that she does not use illicit drugs.   Family History:   The patient's family history includes Cancer in her brother, father, and mother.    ROS:  Please see the history of present illness.   Otherwise, review of systems are positive for none.   All other systems are reviewed and negative.    PHYSICAL EXAM: VS:  BP 150/70 mmHg  Pulse 83  Ht 5' (1.524 m)  Wt 50.803 kg (112 lb)  BMI 21.87 kg/m2 , BMI Body mass index is 21.87 kg/(m^2). GEN: Well nourished, thin WF, in no acute distress HEENT: normal Neck: no JVD, carotid bruits, or masses Cardiac: RRR; normal Z6-1, gr 0-9/6 systolic ejection murmur RUSB.  Respiratory:  clear to auscultation bilaterally, normal work of breathing GI: soft, nontender, nondistended, + BS MS: no deformity or atrophy Skin: warm and dry, no rash Neuro:  Strength and sensation are intact Psych: euthymic mood, full affect   EKG:  EKG is not ordered today.   Recent Labs: 05/18/2015: ALT 10; BUN 10; Creat 0.66; Potassium 4.5; Sodium 142    Lipid Panel    Component Value Date/Time   CHOL 232* 05/18/2015 0917   TRIG 73 05/18/2015 0917   HDL 79 05/18/2015 0917   CHOLHDL 2.9 05/18/2015 0917   VLDL 15 05/18/2015 0917   LDLCALC 138* 05/18/2015 0917      Wt Readings from Last 3 Encounters:  07/30/15 50.803 kg (112 lb)  04/29/15 50.803 kg (112 lb)  04/27/15 50.916 kg (112 lb 4 oz)      Other studies Reviewed: Additional studies/ records that were reviewed today include: none. Review of the above records demonstrates: /   ASSESSMENT AND PLAN: 1. History of saccular aortic aneurysm with severe AI. S/p total arch replacement and AVR.                            2. Labile HTN. Several drug intolerances. BP control has improved. Will continue current regimen.  3. Dyslipidemia 4. Nonobstructive CAD. 5. Anxiety disorder. She is using Xanax prn which really helps but she is cautioned about not driving when taking Xanax.   Current medicines are reviewed at length with the patient today.  The patient does not  have concerns regarding medicines.  The following changes have been made:  See above  Labs/ tests ordered today include: none   No orders of the defined types were placed in this encounter.     Disposition:   FU with Dr. Martinique  in 6 months  Signed, Arwilda Georgia Martinique, MD  07/30/2015 7:54 PM    [facility] [address] [place], [place], [address] Phone [phone], Fax [phone]

## 2015-07-30 NOTE — Patient Instructions (Signed)
Continue your current therapy  I will see you in 6 months.   

## 2015-08-19 ENCOUNTER — Telehealth: Payer: Self-pay | Admitting: Cardiology

## 2015-08-19 NOTE — Telephone Encounter (Signed)
Her blood pressure readings are good and this is important with her history of aortic aneurysm. This is not too low. I would not want to change her current regimen unless BP was dropping below 244-975 systolic. I suspect her lightheadedness is not related to BP  Yvonne Sahli Martinique MD, [facility]

## 2015-08-19 NOTE — Telephone Encounter (Signed)
Pt calling about BP issues. She states lightheadedness "for a couple hours at a time" which then resolves. She does not correlate this lightheadedness to standing/changing positions. She states she doesn't have BP changes, that her BP is just running low. Reports a BP of 136/76 w HR 67 which patient states is "very low for me" but is typical of her recent readings. She is anxious about this.  She acknowledges compliance w/ med changes at last visit - her avapro was cut in half to '75mg'$  daily and she is taking the full dose of the '240mg'$  diltiazem daily. She asked if she could cut the diltiazem. I advised against this change w/o provider review.  Acknowledged patient's concerns, gave reassurance that the BP is WNR. Pt aware there may be other causes of dizziness. Aware I will seek advice before recommending any med changes.  Routed to Dr. Martinique for advice.

## 2015-08-19 NOTE — Telephone Encounter (Signed)
Received return call from patient, and gave her physician recommendations. Pt verbalized understanding. She will report if BP drops below 521-747 systolic and states she has a neurologist she may follow up with.

## 2015-08-19 NOTE — Telephone Encounter (Signed)
Left msg to call.

## 2015-08-19 NOTE — Telephone Encounter (Signed)
New message     Pt c/o medication issue:  1. Name of Medication: Diltiazem  2. How are you currently taking this medication (dosage and times per day)? 240 mg po daily  3. Are you having a reaction (difficulty breathing--STAT)? The pt states she is light headed right now the blood pressure goes down to fast  4. What is your medication issue? The pt would like to cut the strength down is possible. The pt states she was on 120 mg po daily and thinks that strength would be better, would like some advice from the nurse.

## 2015-09-04 ENCOUNTER — Emergency Department (HOSPITAL_COMMUNITY)
Admission: EM | Admit: 2015-09-04 | Discharge: 2015-09-05 | Disposition: A | Discharge status: Home / Self Care | Payer: PRIVATE HEALTH INSURANCE | Attending: Emergency Medicine | Admitting: Emergency Medicine

## 2015-09-04 ENCOUNTER — Emergency Department (HOSPITAL_COMMUNITY): Payer: PRIVATE HEALTH INSURANCE

## 2015-09-04 ENCOUNTER — Encounter (HOSPITAL_COMMUNITY): Payer: Self-pay | Admitting: *Deleted

## 2015-09-04 ENCOUNTER — Other Ambulatory Visit: Payer: Self-pay

## 2015-09-04 DIAGNOSIS — I2510 Atherosclerotic heart disease of native coronary artery without angina pectoris: Secondary | ICD-10-CM | POA: Diagnosis not present

## 2015-09-04 DIAGNOSIS — I10 Essential (primary) hypertension: Secondary | ICD-10-CM | POA: Insufficient documentation

## 2015-09-04 DIAGNOSIS — R0602 Shortness of breath: Secondary | ICD-10-CM | POA: Insufficient documentation

## 2015-09-04 DIAGNOSIS — Z7982 Long term (current) use of aspirin: Secondary | ICD-10-CM | POA: Diagnosis not present

## 2015-09-04 DIAGNOSIS — Z79899 Other long term (current) drug therapy: Secondary | ICD-10-CM | POA: Insufficient documentation

## 2015-09-04 LAB — CBC
HCT: 35.4 % — ABNORMAL LOW (ref 36.0–46.0)
Hemoglobin: 12 g/dL (ref 12.0–15.0)
MCH: 29.4 pg (ref 26.0–34.0)
MCHC: 33.9 g/dL (ref 30.0–36.0)
MCV: 86.8 fL (ref 78.0–100.0)
PLATELETS: 174 10*3/uL (ref 150–400)
RBC: 4.08 MIL/uL (ref 3.87–5.11)
RDW: 12.9 % (ref 11.5–15.5)
WBC: 4.9 10*3/uL (ref 4.0–10.5)

## 2015-09-04 LAB — BASIC METABOLIC PANEL
Anion gap: 9 (ref 5–15)
BUN: 12 mg/dL (ref 6–20)
CALCIUM: 9.4 mg/dL (ref 8.9–10.3)
CHLORIDE: 107 mmol/L (ref 101–111)
CO2: 22 mmol/L (ref 22–32)
CREATININE: 0.68 mg/dL (ref 0.44–1.00)
GFR calc non Af Amer: >60 mL/min (ref >60)
GLUCOSE: 99 mg/dL (ref 65–99)
Potassium: 3.3 mmol/L — ABNORMAL LOW (ref 3.5–5.1)
Sodium: 138 mmol/L (ref 135–145)

## 2015-09-04 LAB — I-STAT TROPONIN, ED: TROPONIN I, POC: 0 ng/mL (ref 0.00–0.08)

## 2015-09-04 NOTE — ED Notes (Addendum)
Pt reports sob today, having shaking and tremors. Denies recent cough or swelling to her legs. Pt hypertensive at triage, bp 223/92 and reports taking meds as directed today.

## 2015-09-05 NOTE — Discharge Instructions (Signed)

## 2015-09-05 NOTE — ED Notes (Signed)
Patient left at this time with all belongings. 

## 2015-09-05 NOTE — ED Provider Notes (Signed)
CSN: [id number]     Arrival date & time 09/04/15  1855 History   First MD Initiated Contact with Patient 09/05/15 0026     Chief Complaint  Patient presents with  . Shortness of Breath     (Consider location/radiation/quality/duration/timing/severity/associated sxs/prior Treatment) HPI Comments: Patient with a history of ascending aortic aneurysm (repaired 04/2014), aortic valve replacement, anxiety, HTN, CAD, HLD presents with SOB that started this morning. No symptoms yesterday. No cough or fever. She denies chest pain at any time today. She states the SOB was worse with walking and she felt like she was going to pass out. No syncope, nausea, vomiting. She was recently started on an additional blood pressure medication (Avapro) 2 weeks ago at 150 mg, which was reduced to 75 mg and the patient reports she took an additional 75 mg tonight when she found her blood pressure to be elevated at home. She also took a Xanax, and Cartia 240 mg. Since arrival to the emergency department she states she feels better. She was able to walk from the waiting room to the exam room without symptoms.   Patient is a 75 y.o. female presenting with shortness of breath. The history is provided by the patient. No language interpreter was used.  Shortness of Breath Severity:  Moderate Duration:  1 day Timing:  Constant Progression:  Improving Chronicity:  New Relieved by:  Nothing Worsened by:  Activity Associated symptoms: no abdominal pain, no chest pain, no cough, no fever and no vomiting     Past Medical History  Diagnosis Date  . Hypertension   . Abnormal ECG   . Anxiety disorder   . Weight loss   . Chest pain   . Heart murmur   . Anxiety     pt. reports that she is nervous person  . Headache     use to have a lot headaches, relative to her work, but pt. is retired now.    . Coronary artery disease 03/2014    mild non obstructive CAD  . Ascending aortic aneurysm [facility])     s/p repair with Bental  procedure  . S/P AVR (aortic valve replacement)     tissue valve  . Dyslipidemia, goal LDL below 70 07/26/2014  . PMR (polymyalgia rheumatica) [facility])    Past Surgical History  Procedure Laterality Date  . Left and right heart catheterization with coronary angiogram N/A 03/26/2014    Procedure: LEFT AND RIGHT HEART CATHETERIZATION WITH CORONARY ANGIOGRAM;  Surgeon: Peter M Martinique, MD;  [facility] LAB;  Service: Cardiovascular;  Laterality: N/A;  . Tonsillectomy    . Tubal ligation    . Bentall procedure N/A 03/31/2014    Procedure: BIO-BENTALL PROCEDURE using a 34m Magna Ease Aortic Tissue Valve, a 26 mm Terumo Gelweave Valsalva Graft, a 14x10x157mTerumo Gelweave Graft, and a 2866mtraight Hemashield Platinum Graft.;  Surgeon: PetIvin PootD;  [facility] [place];  Laterality: N/A;  . Tee without cardioversion N/A 03/31/2014    Procedure: TRANSESOPHAGEAL ECHOCARDIOGRAM (TEE);  Surgeon: PetIvin PootD;  [facility] [place];  Laterality: N/A;   Family History  Problem Relation Age of Onset  . Cancer Mother   . Cancer Father   . Cancer Brother    Social History  Substance Use Topics  . Smoking status: Never Smoker   . Smokeless tobacco: None  . Alcohol Use: Yes     Comment: " I like  wine", - irregular consumption - socially has wine     OB History    No data available     Review of Systems  Constitutional: Negative for fever and chills.  Respiratory: Positive for shortness of breath. Negative for cough.   Cardiovascular: Negative.  Negative for chest pain and leg swelling.  Gastrointestinal: Negative.  Negative for nausea, vomiting and abdominal pain.  Musculoskeletal: Negative.  Negative for myalgias.  Skin: Negative.   Neurological: Positive for light-headedness.      Allergies  Review of patient's allergies indicates no known allergies.  Home Medications   Prior to Admission medications   Medication  Sig Start Date End Date Taking? Authorizing Provider  ALPRAZolam (XANAX PO) Take 10 mg by mouth as needed.    Historical Provider, MD  aspirin EC 81 MG tablet Take 81 mg by mouth daily.    Historical Provider, MD  diltiazem (CARTIA XT) 240 MG 24 hr capsule Take 1 capsule (240 mg total) by mouth daily. 12/24/14   Peter M Martinique, MD  irbesartan (AVAPRO) 75 MG tablet Take 1 tablet (75 mg total) by mouth daily. 06/30/15   Peter M Martinique, MD  Multiple Vitamin (MULTIVITAMIN WITH MINERALS) TABS tablet Take 1 tablet by mouth daily. Patient taking differently: Take 1 tablet by mouth every other day.  04/08/14   Wayne E Gold, PA-C   BP 197/75 mmHg  Pulse 64  Temp(Src) 98.7 F (37.1 C) (Oral)  Resp 17  Wt 51.166 kg  SpO2 99% Physical Exam  Constitutional: She is oriented to person, place, and time. She appears well-developed and well-nourished.  HENT:  Head: Normocephalic.  Neck: Normal range of motion. Neck supple.  Cardiovascular: Normal rate and regular rhythm.   Pulmonary/Chest: Effort normal and breath sounds normal. She has no wheezes. She has no rales.  Abdominal: Soft. Bowel sounds are normal. There is no tenderness. There is no rebound and no guarding.  Musculoskeletal: Normal range of motion. She exhibits no edema.  Neurological: She is alert and oriented to person, place, and time.  Skin: Skin is warm and dry. No rash noted.  Psychiatric: She has a normal mood and affect.    ED Course  Procedures (including critical care time) Labs Review Labs Reviewed  BASIC METABOLIC PANEL - Abnormal; Notable for the following:    Potassium 3.3 (*)    All other components within normal limits  CBC - Abnormal; Notable for the following:    HCT 35.4 (*)    All other components within normal limits  I-STAT TROPOININ, ED    Imaging Review Dg Chest 2 View  09/04/2015  CLINICAL DATA:  Acute onset of shortness of breath and high blood pressure. Initial encounter. EXAM: CHEST  2 VIEW COMPARISON:   Chest radiograph performed 04/29/2015, and CTA of the chest performed 03/24/2014 FINDINGS: The lungs are hyperexpanded, with flattening of the hemidiaphragms, compatible with COPD. There is no evidence of focal opacification, pleural effusion or pneumothorax. The heart is normal in size; the patient is status post median sternotomy. There appears to be mild aneurysmal dilatation of the descending thoracic aorta on the lateral view, measuring up to 4.3 cm, which is minimally increased from the prior study. A valve replacement is noted. The ascending thoracic aortic aneurysm identified in 2016 has been surgically treated. No acute osseous abnormalities are seen. Clips are seen overlying the right lung apex. IMPRESSION: 1. No acute cardiopulmonary process seen. 2. Findings of COPD. 3. Apparent aneurysmal dilatation of the descending  thoracic aorta, measuring up to 4.3 cm, which is minimally increased from the prior study. Would perform follow-up MRA or CTA of the chest in 6 months. Previously noted ascending thoracic aortic aneurysm has been surgically treated and is no longer evident. Electronically Signed   By: Garald Balding M.D.   On: 09/04/2015 19:57   I have personally reviewed and evaluated these images and lab results as part of my medical decision-making.   EKG Interpretation   Date/Time:  Friday September 04 2015 19:01:10 EDT Ventricular Rate:  77 PR Interval:  180 QRS Duration: 128 QT Interval:  420 QTC Calculation: 475 R Axis:   78 Text Interpretation:  Normal sinus rhythm Right bundle branch block ST-t  wave abnormality Abnormal ECG Confirmed by Carmin Muskrat  MD ([id number]) on  09/04/2015 7:08:19 PM      MDM   Final diagnoses:  None    1. Dyspnea  The patient presents with SOB and DOE that started prior to arrival. No chest pain. No cough, fever. She is now asymptomatic. Labs reassuring. Negative troponin, EKG non-acute. She has a complicated medical history including repaired aortic  aneurysm, valvular repair. As symptoms have completely resolved, doubt acute process, likely anxiety related.  She is examined by Dr. Roxanne Mins who agrees with assessment and feels the patient is stable for discharge home.     Charlann Lange, PA-C 93/55/21 [id number]  Delora Fuel, MD 59/53/96 [id number]

## 2015-09-28 ENCOUNTER — Telehealth: Payer: Self-pay | Admitting: Cardiology

## 2015-09-28 MED ORDER — DILTIAZEM HCL ER COATED BEADS 180 MG PO CP24: 180.0000 mg | ORAL_CAPSULE | Freq: Every day | ORAL | Status: DC

## 2015-09-28 NOTE — Telephone Encounter (Signed)
Returned call to patient.She stated she thinks Cartia 240 mg is too strong.Stated she feels weak and shaky after taking.Stated by mid afternoon she feels better.B/P 135/78,129/71,131/62.Stated she would like to try Cartia 180 mg.Message sent to [place] for advice.

## 2015-09-28 NOTE — Telephone Encounter (Signed)
Returned call to patient.Dr.Jordan advised ok to decrease Cardizem CD to 180 mg daily.Advised to call back if continues to feel weak.

## 2015-09-28 NOTE — Telephone Encounter (Signed)
See previous 09/28/15 telephone note.

## 2015-09-28 NOTE — Telephone Encounter (Signed)
New message      Pt states she missed Dr Doug Sou call a few minutes ago.  Please call

## 2015-09-28 NOTE — Telephone Encounter (Signed)
OK we can try 180 mg Cardizem CD daily.  Shiloh Southern Martinique MD, [facility]

## 2015-09-28 NOTE — Addendum Note (Signed)
Addended by: Kathyrn Lass on: 09/28/2015 03:44 PM   Modules accepted: Orders, Medications

## 2015-09-28 NOTE — Telephone Encounter (Signed)
Returned call to patient not at home.[place].

## 2015-10-29 ENCOUNTER — Telehealth: Payer: Self-pay | Admitting: Cardiology

## 2015-10-29 NOTE — Telephone Encounter (Signed)
New message        Talk to the about reevaluating her medications

## 2015-10-29 NOTE — Telephone Encounter (Signed)
Returned call to patient.She stated after she takes cardizem she feels weak and shaky.Stated her B/P drops too quickly.Stated B/P 178/85 took cardizem 240 mg B/P dropped to 120/79.Medication reviewed and you are suppose to take cardizem 180 mg daily.Stated she decided that day to take 240 mg tablet.Advised she should take cardizem 180 mg daily.Advised to continue to monitor B/P, keep a diary and bring to appointment with Rosaria Ferries PA 11/18/15 at 3:00 pm.Advised to call sooner if needed.

## 2015-11-18 ENCOUNTER — Encounter: Payer: Self-pay | Admitting: Physician Assistant

## 2015-11-18 ENCOUNTER — Ambulatory Visit (INDEPENDENT_AMBULATORY_CARE_PROVIDER_SITE_OTHER): Payer: PRIVATE HEALTH INSURANCE | Admitting: Physician Assistant

## 2015-11-18 VITALS — BP 180/74 | HR 64 | Ht 60.0 in | Wt 110.0 lb

## 2015-11-18 DIAGNOSIS — I10 Essential (primary) hypertension: Secondary | ICD-10-CM | POA: Diagnosis not present

## 2015-11-18 MED ORDER — DILTIAZEM HCL ER 60 MG PO CP12: 60.0000 mg | ORAL_CAPSULE | Freq: Two times a day (BID) | ORAL | 3 refills | Status: DC

## 2015-11-18 MED ORDER — IRBESARTAN 150 MG PO TABS: 75.0000 mg | ORAL_TABLET | Freq: Two times a day (BID) | ORAL | 3 refills | Status: DC

## 2015-11-18 NOTE — Progress Notes (Signed)
Cardiology Office Note   Date:  11/18/2015   ID:  Yvonne Ballard, DOB 1940-09-30, MRN [id number]  PCP:  Lynnell Jude, MD  Cardiologist:  Dr Martinique  Barrett, Rhonda, PA-C   Chief Complaint  Patient presents with  . Follow-up    B/P problems. Patient feels weak and shaky.  . Dizziness  . Shortness of Breath    Due to panic attacks.    History of Present Illness: Yvonne Ballard is a 75 y.o. female with a history of HTN, AscAo aneurysm w/ Bental repair, non-obs CAD, HLD, HTN, PMR, anxiety.  Phone messages recently about BP, Cardizem dose, 180 vs 240 mg.  Yvonne Ballard presents for Management of her blood pressure  The patient has noticed that since her heart surgery, she has been having more problems with panic attacks. She feels that she is having trouble driving because of these. She will get behind the wheel of car, and her hands start to shake. She cannot drive. She will take a Xanax which helps very quickly. And then she will be able to drive. She admits that she has had problems with anxiety all of her life, but has never had anything like this before.  She has mild lower extremity daytime edema. She denies orthopnea or PND. She does not have any exertional chest pain.  When she gets a panic attack, she will get short of breath, her hands will get cold, she gets a little chest tightness, and she feels very jittery. She takes Xanax which relieves all of her symptoms in just a few minutes. She is worried about becoming addicted to Xanax, but admits that she got 0.25 mg tablets, one bottle and still has some left after almost a year.  She has recorded her blood pressure at various times of the day, before and after her medications. The records are fairly consistent. In the evening, she takes one half of irbesartan 150 mg. In the morning, when she gets up, her blood pressure will be in the 595G systolic. In the morning, she takes diltiazem 180 mg. Later in the morning, her blood  pressure will be more elevated. She wonders if she is tolerating the Cardizem or if it can be changed. She is having no other issues or concerns.   Past Medical History:  Diagnosis Date  . Abnormal ECG   . Anxiety    pt. reports that she is nervous person  . Anxiety disorder   . Ascending aortic aneurysm [facility])    s/p repair with Bental procedure  . Chest pain   . Coronary artery disease 03/2014   mild non obstructive CAD  . Dyslipidemia, goal LDL below 70 07/26/2014  . Headache    use to have a lot headaches, relative to her work, but pt. is retired now.    Marland Kitchen Heart murmur   . Hypertension   . 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HCC)   . S/P AVR (aortic valve replacement)    tissue valve  . Weight loss     Past Surgical History:  Procedure Laterality Date  . BENTALL PROCEDURE N/A 03/31/2014   Procedure: BIO-BENTALL PROCEDURE using a 66m Magna Ease Aortic Tissue Valve, a 26 mm Terumo Gelweave Valsalva Graft, a 14x10x180mTerumo Gelweave Graft, and a 2852mtraight Hemashield Platinum Graft.;  Surgeon: PetIvin PootD;  [facility];  Laterality: N/A;  . LEFT AND RIGHT HEART CATHETERIZATION WITH CORONARY ANGIOGRAM N/A 03/26/2014   Procedure: LEFT  AND RIGHT HEART CATHETERIZATION WITH CORONARY ANGIOGRAM;  Surgeon: Peter M Martinique, MD;  Location[facility] C[facility] LAB;  Service: Cardiovascular;  Laterality: N/A;  . TEE WITHOUT CARDIOVERSION N/A 03/31/2014   Procedure: TRANSESOPHAGEAL ECHOCARDIOGRAM (TEE);  Surgeon: Ivin Poot, MD;  Location: [place];  Service: Open Heart Surgery;  Laterality: N/A;  . TONSILLECTOMY    . TUBAL LIGATION      Current Outpatient Prescriptions  Medication Sig Dispense Refill  . ALPRAZolam (XANAX) 0.25 MG tablet Take 0.125-0.25 mg by mouth 3 (three) times daily as needed for anxiety.    Marland Kitchen aspirin EC 81 MG tablet Take 81 mg by mouth daily.    Marland Kitchen diltiazem (CARDIZEM CD) 180 MG 24 hr capsule Take 1 capsule (180 mg total) by mouth daily. 30  capsule 6  . irbesartan (AVAPRO) 75 MG tablet Take 1 tablet (75 mg total) by mouth daily. 30 tablet 6  . Multiple Vitamin (MULTIVITAMIN WITH MINERALS) TABS tablet Take 1 tablet by mouth daily. (Patient taking differently: Take 1 tablet by mouth every other day. )     No current facility-administered medications for this visit.     Allergies:   Review of patient's allergies indicates no known allergies.    Social History:  The patient  reports that she has never smoked. She has never used smokeless tobacco. She reports that she drinks alcohol. She reports that she does not use drugs.   Family History:  The patient's family history includes Cancer in her brother, father, and mother.    ROS:  Please see the history of present illness. All other systems are reviewed and negative.    PHYSICAL EXAM: VS:  BP (!) 180/74   Pulse 64   Ht 5' (1.524 m)   Wt 110 lb (49.9 kg)   BMI 21.48 kg/m  , BMI Body mass index is 21.48 kg/m. GEN: Well nourished, well developed, female in no acute distress  HEENT: normal for age  Neck: JVD on the left to her jaw, minimal on the right, no carotid bruit, no masses Cardiac: RRR; 3/6 murmur, no rubs, or gallops Respiratory:  clear to auscultation bilaterally, normal work of breathing GI: soft, nontender, nondistended, + BS MS: no deformity or atrophy; no edema; distal pulses are 2+ in all 4 extremities   Skin: warm and dry, no rash Neuro:  Strength and sensation are intact Psych: euthymic mood, full affect   EKG:  EKG is not ordered today.  Recent Labs: 05/18/2015: ALT 10 09/04/2015: BUN 12; Creatinine, Ser 0.68; Hemoglobin 12.0; Platelets 174; Potassium 3.3; Sodium 138    Lipid Panel    Component Value Date/Time   CHOL 232 (H) 05/18/2015 0917   TRIG 73 05/18/2015 0917   HDL 79 05/18/2015 0917   CHOLHDL 2.9 05/18/2015 0917   VLDL 15 05/18/2015 0917   LDLCALC 138 (H) 05/18/2015 0917     Wt Readings from Last 3 Encounters:  11/18/15 110 lb (49.9  kg)  09/04/15 112 lb 12.8 oz (51.2 kg)  07/30/15 112 lb (50.8 kg)     Other studies Reviewed: Additional studies/ records that were reviewed today include: Office notes and hospital records..  ASSESSMENT AND PLAN:  1.  Hypertension: I reviewed the records with the patient and her daughter. I advised that the irbesartan was doing a pretty good job of controlling her blood pressure, but did not seem to last quite 24 hours. I suggested that she take one half of the irbesartan twice a day. She may  need better blood pressure control than that, so we will give her a decreased dose of the Cardizem, short acting. She is to change the Cardizem to 60 mg tablets twice a day. If this is adequate to control her blood pressure no further changes. If she needs better blood pressure control, further adjustments can be made. If this dose is too strong, we can taper and discontinue the Cardizem.  She and her daughter are in agreement with this as a plan of care and she is encouraged to continue tracking her blood pressure.  2. Status post Bentall repair of ascending aortic aneurysm: Her last echocardiogram was February 2016. As she is asymptomatic we will not repeat it at this time, follow-up echocardiogram per Dr. Martinique   Current medicines are reviewed at length with the patient today.  The patient has concerns regarding medicines. Concerns were addressed  The following changes have been made:  Change the irbesartan to one half of a 150 mg tablet twice a day, change the Cardizem from Cardizem CD 180 mg to Cardizem short acting, 60 mg twice a day  Labs/ tests ordered today include:  No orders of the defined types were placed in this encounter.    Disposition:   FU with Dr. Martinique  Signed, Rosaria Ferries, PA-C  11/18/2015 2:52 PM    [facility] Phone: [phone]; Fax: [phone]  This note was written with the assistance of speech recognition software. Please excuse any  transcriptional errors.

## 2015-11-18 NOTE — Patient Instructions (Signed)
Medications:  STOP Diltiazem '180mg'$      START Diltiazem 60 mg twice daily.  Take Irbesartan '75mg'$  (1/2 tab) twice daily.   Follow-Up:  Please keep your upcoming appointment: 02/29/16 at 11:45 am with Dr. Martinique.  If you need a refill on your cardiac medications before your next appointment, please call your pharmacy.

## 2015-11-23 ENCOUNTER — Telehealth: Payer: Self-pay | Admitting: Cardiology

## 2015-11-23 MED ORDER — AMOXICILLIN 500 MG PO CAPS: ORAL_CAPSULE | ORAL | 3 refills | Status: DC

## 2015-11-23 NOTE — Telephone Encounter (Signed)
Does pt need to be pre medicated before dental work? Please fax to [phone].

## 2015-11-23 NOTE — Telephone Encounter (Signed)
Returned call to Yvonne Ballard at [place] office.Advised she does needs need SBE prophylaxis before dental procedures.Spoke to patient advised she needs amoxicillin 2 gms 1 hour before dental procedures.

## 2016-01-25 ENCOUNTER — Telehealth: Payer: Self-pay | Admitting: Cardiology

## 2016-01-25 MED ORDER — IRBESARTAN 150 MG PO TABS: 75.0000 mg | ORAL_TABLET | Freq: Two times a day (BID) | ORAL | 6 refills | Status: DC

## 2016-01-25 MED ORDER — DILTIAZEM HCL ER COATED BEADS 180 MG PO CP24: 180.0000 mg | ORAL_CAPSULE | Freq: Every day | ORAL | 6 refills | Status: DC

## 2016-01-25 NOTE — Telephone Encounter (Signed)
Returned call to patient Dr.Jordan's recommendations given. 

## 2016-01-25 NOTE — Telephone Encounter (Signed)
Pt calling regarding Irbesartan , got last rx 01-11-16 and can't pick up refill until 02-11-16 but only has two days worth left, insurance will not cover her to get yet, what to do?

## 2016-01-25 NOTE — Telephone Encounter (Signed)
Returned call to patient.She stated she takes Irbesartan 150 mg 1/2 tablet twice a day.Stated some days when her B/P is elevated 160/80,173/80 she takes a 1 tablet twice a day.She stated when her B/P is elevated she becomes very nervous.Advised I will send message to Dr.Jordan to make sure ok to take 150 mg of Irbesartan twice a day.

## 2016-01-25 NOTE — Telephone Encounter (Signed)
I think it is fine for her to take irbesartan 150 mg (take 1/2 tablet bid). If BP remains elevated I would resume Cardizem CD 180 mg daily. Yvonne Ballard had reduced her to Cardizem 60 mg bid.  Peter Martinique MD, [facility]

## 2016-02-26 NOTE — Progress Notes (Signed)
Cardiology Office Note   Date:  02/29/2016   ID:  [name], DOB [birth year], MRN [id number]  PCP:  Lynnell Jude, MD  Cardiologist:  Dr. Martinique     History of Present Illness: Yvonne Ballard is a 75 y.o. female with a hx of HTN, HLD, thoracic aortic aneurysm s/p Bental procedure (03/2014), and nonobst CAD who presents for follow up   She was initially seen in 03/2014. A 2D echo was done which showed a large thoracic aneurysm and the patient underwent heart catheterization showing mild nonobstructive CAD with mild LV dysfunction EF 45% with thoracic aneurysm and mildly elevated PA pressures. She underwent ascending and arch TAA repair with Bio-Bental procedure with tissue AVR and aorto-brachiocephalic bypass and aorto-left common carotid bypass on 03/31/2014. Postoperatively, she developed a right pleural effusion which required thoracentesis and was treated with lasix. She had some post op afib treated with Amio and digoxin.  On follow up today she is doing well from a cardiac standpoint. She denies any SOB, chest pain, palpitations, edema. She does note that ever since her surgery she is "ratteled". She notes emotional lability. She gets upset easily with the slightest change. She is tearful and anxious. She doesn't want to do a lot. She is worried about falling although she denies any dizziness or lightheadedness. She has a difficult time getting started in the am. She denies any numbness or weakness.    Past Medical History:  Diagnosis Date  . Abnormal ECG   . Anxiety    pt. reports that she is nervous person  . Anxiety disorder   . Ascending aortic aneurysm [facility])    s/p repair with Bental procedure  . Chest pain   . Coronary artery disease 03/2014   mild non obstructive CAD  . Dyslipidemia, goal LDL below 70 07/26/2014  . Headache    use to have a lot headaches, relative to her work, but pt. is retired now.    Marland Kitchen Heart murmur   . Hypertension   . PMR (polymyalgia  rheumatica) (HCC)   . S/P AVR (aortic valve replacement)    tissue valve  . Weight loss     Past Surgical History:  Procedure Laterality Date  . BENTALL PROCEDURE N/A 03/31/2014   Procedure: BIO-BENTALL PROCEDURE using a 65m Magna Ease Aortic Tissue Valve, a 26 mm Terumo Gelweave Valsalva Graft, a 14x10x120mTerumo Gelweave Graft, and a 2877mtraight Hemashield Platinum Graft.;  Surgeon: PetIvin PootD;  [facility] [place];  Laterality: N/A;  . LEFT AND RIGHT HEART CATHETERIZATION WITH CORONARY ANGIOGRAM N/A 03/26/2014   Procedure: LEFT AND RIGHT HEART CATHETERIZATION WITH CORONARY ANGIOGRAM;  Surgeon: Kataleyah Carducci M JorMartiniqueD;  [facility];  Service: Cardiovascular;  Laterality: N/A;  . TEE WITHOUT CARDIOVERSION N/A 03/31/2014   Procedure: TRANSESOPHAGEAL ECHOCARDIOGRAM (TEE);  Surgeon: PetIvin PootD;  [facility];  Laterality: N/A;  . TONSILLECTOMY    . TUBAL LIGATION       Current Outpatient Prescriptions  Medication Sig Dispense Refill  . amoxicillin (AMOXIL) 500 MG capsule Take 2 Gms ( 4 tablets ) 1 hour before dental work 4 capsule 3  . aspirin EC 81 MG tablet Take 81 mg by mouth daily.    . dMarland Kitchenltiazem (CARDIZEM CD) 180 MG 24 hr capsule Take 1 capsule (180 mg total) by mouth daily. 30 capsule 6  . irbesartan (AVAPRO) 150 MG tablet Take 0.5  tablets (75 mg total) by mouth 2 (two) times daily. 60 tablet 6  . Multiple Vitamin (MULTIVITAMIN WITH MINERALS) TABS tablet Take 1 tablet by mouth daily. (Patient taking differently: Take 1 tablet by mouth every other day. )     No current facility-administered medications for this visit.     Allergies:   Patient has no known allergies.    Social History:  The patient  reports that she has never smoked. She has never used smokeless tobacco. She reports that she drinks alcohol. She reports that she does not use drugs.   Family History:  The patient's family history  includes Cancer in her brother, father, and mother.    ROS:  Please see the history of present illness.   Otherwise, review of systems are positive for none.   All other systems are reviewed and negative.    PHYSICAL EXAM: VS:  BP (!) 162/88   Pulse 76   Ht 5' (1.524 m)   Wt 107 lb 3.2 oz (48.6 kg)   BMI 20.94 kg/m  , BMI Body mass index is 20.94 kg/m. GEN: Well nourished, well developed, in no acute distress  HEENT: normal  Neck: no JVD, carotid bruits, or masses Cardiac: RRR; soft 1-2/6 SEM, rubs, or gallops,no edema  Respiratory:  clear to auscultation bilaterally, normal work of breathing GI: soft, nontender, nondistended, + BS MS: no deformity or atrophy  Skin: warm and dry, no rash Neuro:  Strength and sensation are intact    EKG:  EKG is not ordered today.   Recent Labs: 05/18/2015: ALT 10 09/04/2015: BUN 12; Creatinine, Ser 0.68; Hemoglobin 12.0; Platelets 174; Potassium 3.3; Sodium 138    Lipid Panel    Component Value Date/Time   CHOL 232 (H) 05/18/2015 0917   TRIG 73 05/18/2015 0917   HDL 79 05/18/2015 0917   CHOLHDL 2.9 05/18/2015 0917   VLDL 15 05/18/2015 0917   LDLCALC 138 (H) 05/18/2015 0917      Wt Readings from Last 3 Encounters:  02/29/16 107 lb 3.2 oz (48.6 kg)  11/18/15 110 lb (49.9 kg)  09/04/15 112 lb 12.8 oz (51.2 kg)      Other studies Reviewed: Additional studies/ records that were reviewed today include: 2D ECHO Review of the above records demonstrates:  -- 2D ECHO (05/08/14) EF 55-60%, no RWMA, G2DD, s/p biological Bentall procedure w/ total large replacement of a large arch aneurysm. Mean AV grad WNL for bioprosthesis. No AR. Mod MR thickening. L pleural effusion.    ASSESSMENT AND PLAN:  HTN -BP is mildly elevated at home. Will continue current therapy.  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 Continue ASA.  HLD- patient is not on statin. Stopped taking.  Mild LV dysfunction EF 45%- now resolved on last ECHO  Ascending  aortic aneurysm s/p extensive Bental procedure with tissue AVR -- 2D ECHO ON 05/08/14 with normal LVF with stable AVR and ascending aortic replacement  Post-op afib with no reoccurrence  Emotional lability with anxiety: she thinks this is related to changes post op from being on bypass pump. This certainly may be a factor but she appears mentally sharp with good memory and no focal neurologic effects. Her emotional lability is very limiting for her. I have strongly recommended she see her primary care to initiate a treatment program. She was taking Xanax at one point with improvement but stopped because she was afraid of getting addicted. Someone mentioned starting sertraline. I think this is best managed by her primary  care.    Current medicines are reviewed at length with the patient today.  The patient does not have concerns regarding medicines.  The following changes have been made:  no change  Labs/ tests ordered today include: none     Disposition:   FU in 6 months.   Signed, Edwardo Wojnarowski Martinique, MD  02/29/2016 1:10 PM    [facility]

## 2016-02-29 ENCOUNTER — Encounter: Payer: Self-pay | Admitting: Cardiology

## 2016-02-29 ENCOUNTER — Ambulatory Visit (INDEPENDENT_AMBULATORY_CARE_PROVIDER_SITE_OTHER): Payer: PRIVATE HEALTH INSURANCE | Admitting: Cardiology

## 2016-02-29 VITALS — BP 162/88 | HR 76 | Ht 60.0 in | Wt 107.2 lb

## 2016-02-29 DIAGNOSIS — I2510 Atherosclerotic heart disease of native coronary artery without angina pectoris: Secondary | ICD-10-CM

## 2016-02-29 DIAGNOSIS — Z952 Presence of prosthetic heart valve: Secondary | ICD-10-CM | POA: Diagnosis not present

## 2016-02-29 DIAGNOSIS — I712 Thoracic aortic aneurysm, without rupture, unspecified: Secondary | ICD-10-CM

## 2016-02-29 DIAGNOSIS — I10 Essential (primary) hypertension: Secondary | ICD-10-CM

## 2016-02-29 NOTE — Patient Instructions (Signed)
Continue your current medication  You should see your primary care about treating your anxiety and emotional lability  I will see you in 6 months.

## 2016-03-05 ENCOUNTER — Emergency Department (HOSPITAL_COMMUNITY): Payer: PRIVATE HEALTH INSURANCE

## 2016-03-05 ENCOUNTER — Emergency Department (HOSPITAL_COMMUNITY)
Admission: EM | Admit: 2016-03-05 | Discharge: 2016-03-05 | Disposition: A | Discharge status: Home / Self Care | Payer: PRIVATE HEALTH INSURANCE | Attending: Emergency Medicine | Admitting: Emergency Medicine

## 2016-03-05 ENCOUNTER — Encounter (HOSPITAL_COMMUNITY): Payer: Self-pay | Admitting: *Deleted

## 2016-03-05 DIAGNOSIS — Y999 Unspecified external cause status: Secondary | ICD-10-CM | POA: Insufficient documentation

## 2016-03-05 DIAGNOSIS — S59911A Unspecified injury of right forearm, initial encounter: Secondary | ICD-10-CM | POA: Diagnosis present

## 2016-03-05 DIAGNOSIS — Y9389 Activity, other specified: Secondary | ICD-10-CM | POA: Diagnosis not present

## 2016-03-05 DIAGNOSIS — S52571A Other intraarticular fracture of lower end of right radius, initial encounter for closed fracture: Secondary | ICD-10-CM | POA: Diagnosis not present

## 2016-03-05 DIAGNOSIS — Z7982 Long term (current) use of aspirin: Secondary | ICD-10-CM | POA: Insufficient documentation

## 2016-03-05 DIAGNOSIS — W010XXA Fall on same level from slipping, tripping and stumbling without subsequent striking against object, initial encounter: Secondary | ICD-10-CM | POA: Insufficient documentation

## 2016-03-05 DIAGNOSIS — Y929 Unspecified place or not applicable: Secondary | ICD-10-CM | POA: Diagnosis not present

## 2016-03-05 DIAGNOSIS — I2510 Atherosclerotic heart disease of native coronary artery without angina pectoris: Secondary | ICD-10-CM | POA: Insufficient documentation

## 2016-03-05 DIAGNOSIS — W19XXXA Unspecified fall, initial encounter: Secondary | ICD-10-CM

## 2016-03-05 DIAGNOSIS — S52501A Unspecified fracture of the lower end of right radius, initial encounter for closed fracture: Secondary | ICD-10-CM

## 2016-03-05 DIAGNOSIS — I10 Essential (primary) hypertension: Secondary | ICD-10-CM | POA: Insufficient documentation

## 2016-03-05 MED ORDER — HYDROCODONE-ACETAMINOPHEN 5-325 MG PO TABS: 0.5000 | ORAL_TABLET | Freq: Four times a day (QID) | ORAL | 0 refills | Status: DC | PRN

## 2016-03-05 MED ORDER — OXYCODONE-ACETAMINOPHEN 5-325 MG PO TABS
1.0000 | ORAL_TABLET | ORAL | Status: DC | PRN
  Administered 2016-03-05: 1 via ORAL

## 2016-03-05 MED ORDER — OXYCODONE-ACETAMINOPHEN 5-325 MG PO TABS
ORAL_TABLET | ORAL | Status: AC
  Filled 2016-03-05: qty 1

## 2016-03-05 NOTE — ED Notes (Signed)
Called X-ray.  They stated when they called the pt she did not respond and they cancelled xray.  Re-ordered.

## 2016-03-05 NOTE — ED Triage Notes (Addendum)
Pt breaking sticks and tripped on a stick, landing on R wrist.  Swelling and deformity to R wrist.  Pt states she initially had a panic attack immediately after fall, but feels those s/s are improving.

## 2016-03-05 NOTE — ED Provider Notes (Signed)
[facility] DEPT Provider Note   CSN: [id number] Arrival date & time: 03/05/16  1313     History   Chief Complaint Chief Complaint  Patient presents with  . Fall  . Wrist Pain    HPI Yvonne Ballard is a 75 y.o. female.  HPI  75 year old female presents after falling and landing on her outstretched hand. She is having swelling and pain to her distal wrist. Patient states she did not hit her head or lose consciousness. She was trying to break sticks with her foot and lost her balance and fell. She denies any numbness or weakness. She was given a Percocet in the waiting room and feels improved. There is no pain at rest, only with movement.  Past Medical History:  Diagnosis Date  . Abnormal ECG   . Anxiety    pt. reports that she is nervous person  . Anxiety disorder   . Ascending aortic aneurysm [facility])    s/p repair with Bental procedure  . Chest pain   . Coronary artery disease 03/2014   mild non obstructive CAD  . Dyslipidemia, goal LDL below 70 07/26/2014  . Headache    use to have a lot headaches, relative to her work, but pt. is retired now.    Marland Kitchen Heart murmur   . Hypertension   . 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HCC)   . S/P AVR (aortic valve replacement)    tissue valve  . Weight loss     Patient Active Problem List   Diagnosis Date Noted  . CAD (coronary artery disease), native coronary artery 07/26/2014  . Dyslipidemia, goal LDL below 70 07/26/2014  . S/P AVR (aortic valve replacement)   . 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 Benign essential HTN 03/18/2014  . LVH (left ventricular hypertrophy) due to hypertensive disease 03/18/2014  . Heart murmur 03/18/2014    Past Surgical History:  Procedure Laterality Date  . BENTALL PROCEDURE N/A 03/31/2014   Procedure: BIO-BENTALL PROCEDURE using a 33m Magna Ease Aortic Tissue Valve, a 26 mm Terumo Gelweave Valsalva Graft, a 14x10x158mTerumo Gelweave Graft, and a 2889mtraight Hemashield Platinum Graft.;   Surgeon: PetIvin PootD;  [facility];  Laterality: N/A;  . LEFT AND RIGHT HEART CATHETERIZATION WITH CORONARY ANGIOGRAM N/A 03/26/2014   Procedure: LEFT AND RIGHT HEART CATHETERIZATION WITH CORONARY ANGIOGRAM;  Surgeon: Peter M JorMartiniqueD;  [facility];  Service: Cardiovascular;  Laterality: N/A;  . TEE WITHOUT CARDIOVERSION N/A 03/31/2014   Procedure: TRANSESOPHAGEAL ECHOCARDIOGRAM (TEE);  Surgeon: PetIvin PootD;  [facility];  Laterality: N/A;  . TONSILLECTOMY    . TUBAL LIGATION      OB History    No data available       Home Medications    Prior to Admission medications   Medication Sig Start Date End Date Taking? Authorizing Provider  amoxicillin (AMOXIL) 500 MG capsule Take 2 Gms ( 4 tablets ) 1 hour before dental work 11/23/15   Peter M JorMartiniqueD  aspirin EC 81 MG tablet Take 81 mg by mouth daily.    Historical Provider, MD  diltiazem (CARDIZEM CD) 180 MG 24 hr capsule Take 1 capsule (180 mg total) by mouth daily. 01/25/16   Peter M JorMartiniqueD  HYDROcodone-acetaminophen (NORCO) 5-325 MG tablet Take 0.5-1 tablets by mouth every 6 (six) hours as needed for severe pain. 03/05/16   ScoSherwood GamblerD  irbesartan (AVLevy Sjogren  150 MG tablet Take 0.5 tablets (75 mg total) by mouth 2 (two) times daily. 01/25/16   Peter M Martinique, MD  Multiple Vitamin (MULTIVITAMIN WITH MINERALS) TABS tablet Take 1 tablet by mouth daily. Patient taking differently: Take 1 tablet by mouth every other day.  04/08/14   [name], PA-C    Family History Family History  Problem Relation Age of Onset  . Cancer Mother   . Cancer Father   . Cancer Brother     Social History Social History  Substance Use Topics  . Smoking status: Never Smoker  . Smokeless tobacco: Never Used  . Alcohol use Yes     Comment: " I like wine", - irregular consumption - socially has wine       Allergies   Patient has no known  allergies.   Review of Systems Review of Systems  Musculoskeletal: Positive for arthralgias and joint swelling.  Skin: Negative for wound.  Neurological: Negative for weakness and numbness.  All other systems reviewed and are negative.    Physical Exam Updated Vital Signs BP 172/73 (BP Location: Right Arm)   Pulse 70   Temp 98.2 F (36.8 C) (Oral)   Resp 24   Ht 5' (1.524 m)   Wt 107 lb (48.5 kg)   SpO2 100%   BMI 20.90 kg/m   Physical Exam  Constitutional: She is oriented to person, place, and time. She appears well-developed and well-nourished.  HENT:  Head: Normocephalic and atraumatic.  Right Ear: External ear normal.  Left Ear: External ear normal.  Nose: Nose normal.  Eyes: Right eye exhibits no discharge. Left eye exhibits no discharge.  Cardiovascular: Normal rate and regular rhythm.   Pulses:      Radial pulses are 2+ on the right side.  Pulmonary/Chest: Effort normal.  Abdominal: She exhibits no distension.  Musculoskeletal:       Right elbow: She exhibits no swelling. No tenderness found.       Right wrist: She exhibits tenderness, bony tenderness and swelling. She exhibits no deformity.       Right forearm: She exhibits no tenderness.       Right hand: She exhibits no tenderness.  Normal radial, ulnar and median nerve function (save for decreased ROM of wrist) in RUE. Ecchymosis in distal forearm with swelling  Neurological: She is alert and oriented to person, place, and time.  Skin: Skin is warm and dry.  Nursing note and vitals reviewed.    ED Treatments / Results  Labs (all labs ordered are listed, but only abnormal results are displayed) Labs Reviewed - No data to display  EKG  EKG Interpretation None       Radiology Dg Wrist Complete Right  Result Date: 03/05/2016 CLINICAL DATA:  Recent fall with wrist pain, initial encounter EXAM: RIGHT WRIST - COMPLETE 3+ VIEW COMPARISON:  None. FINDINGS: Comminuted fracture of the distal radius is  noted extending into the radiocarpal articulation. Some impaction and posterior angulation at the fracture site is noted. No other fracture is seen. IMPRESSION: Comminuted intra-articular fracture of the distal right radius. Electronically Signed   By: Inez Catalina M.D.   On: 03/05/2016 17:30    Procedures Procedures (including critical care time)  Medications Ordered in ED Medications  oxyCODONE-acetaminophen (PERCOCET/ROXICET) 5-325 MG per tablet 1 tablet (1 tablet Oral Given 03/05/16 1344)  oxyCODONE-acetaminophen (PERCOCET/ROXICET) 5-325 MG per tablet (not administered)     Initial Impression / Assessment and Plan / ED Course  I  have reviewed the triage vital signs and the nursing notes.  Pertinent labs & imaging results that were available during my care of the patient were reviewed by me and considered in my medical decision making (see chart for details).  Clinical Course     Patient is neurovascularly intact. D/w Dr. Burney Gauze who viewed xray and agrees no manipulation needed. Call his office on 12/26 for likely appt on 12/28. Sugar tong splint. She does not need pain meds at this time but wants some to go home with just in case. No other injuries. D/c home with return precautions.  Final Clinical Impressions(s) / ED Diagnoses   Final diagnoses:  Fall, initial encounter  Closed fracture of distal end of right radius, unspecified fracture morphology, initial encounter    New Prescriptions New Prescriptions   HYDROCODONE-ACETAMINOPHEN (NORCO) 5-325 MG TABLET    Take 0.5-1 tablets by mouth every 6 (six) hours as needed for severe pain.     Sherwood Gambler, MD 03/05/16 [phone]

## 2016-03-05 NOTE — ED Notes (Signed)
(  Daughter that is with pt now states pt's other daughter who lives in town is mildly disabled and will not be able to follow discharge instructions for pt, though pt is ao x 4 when she's not having a panic attack)

## 2016-03-05 NOTE — Progress Notes (Signed)
Orthopedic Tech Progress Note Patient Details:  Yvonne Ballard 1940/10/07 [id number]  Ortho Devices Type of Ortho Device: Ace wrap, Arm sling, Sugartong splint Ortho Device/Splint Interventions: Application   [name] 03/05/2016, 7:08 PM

## 2016-05-27 ENCOUNTER — Other Ambulatory Visit: Payer: Self-pay

## 2016-05-27 ENCOUNTER — Other Ambulatory Visit: Payer: Self-pay | Admitting: Cardiology

## 2016-05-27 MED ORDER — DILTIAZEM HCL ER COATED BEADS 180 MG PO CP24: 180.0000 mg | ORAL_CAPSULE | Freq: Every day | ORAL | 1 refills | Status: DC

## 2016-05-27 MED ORDER — IRBESARTAN 150 MG PO TABS: 75.0000 mg | ORAL_TABLET | Freq: Two times a day (BID) | ORAL | 1 refills | Status: DC

## 2016-05-27 NOTE — Telephone Encounter (Signed)
Returned call to patient Her notice says she should request a 3 month prescription from her doctor She states she needs to call [facility] to request this She does not need a refill sent at this time

## 2016-05-27 NOTE — Telephone Encounter (Signed)
New Message  Pt c/o medication issue:  1. Name of Medication: Diltiazem.... Irbesartan  2. How are you currently taking this medication (dosage and times per day)? 180.Marland KitchenMarland KitchenMarland KitchenMarland Kitchen 150   3. Are you having a reaction (difficulty breathing--STAT)? no  4. What is your medication issue? Pt states he insurance company wants pt to receive a 90 day supply instead of a 30 day supply. Please call back to discuss

## 2016-08-28 NOTE — Progress Notes (Signed)
Cardiology Office Note   Date:  08/31/2016   ID:  Yvonne Ballard, DOB [birth year], MRN [id number]  PCP:  Kelton Pillar, MD  Cardiologist:  Dr. Martinique   History of Present Illness: Yvonne Ballard is a 76 y.o. female with a hx of HTN, HLD, thoracic aortic aneurysm s/p Bental procedure (03/2014), and nonobst CAD who presents for follow up   She was initially seen in 03/2014. A 2D echo was done which showed a large thoracic aneurysm and the patient underwent heart catheterization showing mild nonobstructive CAD with mild LV dysfunction EF 45% with thoracic aneurysm and mildly elevated PA pressures. She underwent ascending and arch TAA repair with Bio-Bental procedure with tissue AVR and aorto-brachiocephalic bypass and aorto-left common carotid bypass on 03/31/2014.   She was seen in the ED in December with a right radial fracture following a fall. Managed by Ortho without surgery.  On follow up today she is doing well from a cardiac standpoint. She denies any SOB, chest pain, palpitations, edema. She continues to complain of being  "ratteled".  She gets upset easily with the slightest change. She notes BP lability with high morning readings then her BP will drop 50 mm after taking Cartia XT in am. She then feels very tired and has to take a nap.   Past Medical History:  Diagnosis Date  . Abnormal ECG   . Anxiety    pt. reports that she is nervous person  . Anxiety disorder   . Ascending aortic aneurysm [facility])    s/p repair with Bental procedure  . Chest pain   . Coronary artery disease 03/2014   mild non obstructive CAD  . Dyslipidemia, goal LDL below 70 07/26/2014  . Headache    use to have a lot headaches, relative to her work, but pt. is retired now.    Marland Kitchen Heart murmur   . Hypertension   . 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HCC)   . S/P AVR (aortic valve replacement)    tissue valve  . Weight loss     Past Surgical History:  Procedure Laterality Date  . BENTALL PROCEDURE N/A  03/31/2014   Procedure: BIO-BENTALL PROCEDURE using a 80mm Magna Ease Aortic Tissue Valve, a 26 mm Terumo Gelweave Valsalva Graft, a 14x10x45mm Terumo Gelweave Graft, and a 7mm straight Hemashield Platinum Graft.;  Surgeon: Ivin Poot, MD;  Location: [facility];  Service: Open Heart Surgery;  Laterality: N/A;  . LEFT AND RIGHT HEART CATHETERIZATION WITH CORONARY ANGIOGRAM N/A 03/26/2014   Procedure: LEFT AND RIGHT HEART CATHETERIZATION WITH CORONARY ANGIOGRAM;  Surgeon: Zaylynn Rickett M Martinique, MD;  Location: [facility] LAB;  Service: Cardiovascular;  Laterality: N/A;  . TEE WITHOUT CARDIOVERSION N/A 03/31/2014   Procedure: TRANSESOPHAGEAL ECHOCARDIOGRAM (TEE);  Surgeon: Ivin Poot, MD;  Location: [facility];  Service: Open Heart Surgery;  Laterality: N/A;  . TONSILLECTOMY    . TUBAL LIGATION       Current Outpatient Prescriptions  Medication Sig Dispense Refill  . amoxicillin (AMOXIL) 500 MG capsule Take 2 Gms ( 4 tablets ) 1 hour before dental work 4 capsule 3  . aspirin 81 MG tablet Take 81 mg by mouth daily.    . irbesartan (AVAPRO) 150 MG tablet Take 0.5 tablets (75 mg total) by mouth 2 (two) times daily. 180 tablet 1  . Multiple Vitamin (MULTIVITAMIN WITH MINERALS) TABS tablet Take 1 tablet by mouth daily. (Patient taking differently: Take 1 tablet by mouth every other day. )    .  diltiazem (CARDIZEM SR) 90 MG 12 hr capsule Take 1 capsule (90 mg total) by mouth 2 (two) times daily. 60 capsule 6   No current facility-administered medications for this visit.     Allergies:   Patient has no known allergies.    Social History:  The patient  reports that she has never smoked. She has never used smokeless tobacco. She reports that she drinks alcohol. She reports that she does not use drugs.   Family History:  The patient's family history includes Cancer in her brother, father, and mother.    ROS:  Please see the history of present illness.   Otherwise, review of systems are positive for none.   All  other systems are reviewed and negative.    PHYSICAL EXAM: VS:  BP (!) 188/84   Pulse 67   Ht 5' (1.524 m)   Wt 104 lb (47.2 kg)   BMI 20.31 kg/m  , BMI Body mass index is 20.31 kg/m. GEN: Well nourished, well developed, in no acute distress  HEENT: normal  Neck: no JVD, carotid bruits, or masses Cardiac: RRR; soft 1-2/6 SEM, rubs, or gallops,no edema  Respiratory:  clear to auscultation bilaterally, normal work of breathing GI: soft, nontender, nondistended, + BS MS: no deformity or atrophy  Skin: warm and dry, no rash Neuro:  Strength and sensation are intact    EKG:  EKG is not ordered today.   Recent Labs: 09/04/2015: BUN 12; Creatinine, Ser 0.68; Hemoglobin 12.0; Platelets 174; Potassium 3.3; Sodium 138    Lipid Panel    Component Value Date/Time   CHOL 232 (H) 05/18/2015 0917   TRIG 73 05/18/2015 0917   HDL 79 05/18/2015 0917   CHOLHDL 2.9 05/18/2015 0917   VLDL 15 05/18/2015 0917   LDLCALC 138 (H) 05/18/2015 0917      Wt Readings from Last 3 Encounters:  08/31/16 104 lb (47.2 kg)  03/05/16 107 lb (48.5 kg)  02/29/16 107 lb 3.2 oz (48.6 kg)     Ecg today shows NSR with RBBB. I have personally reviewed and interpreted this study.  Other studies Reviewed:  -- 2D ECHO (05/08/14) EF 55-60%, no RWMA, G2DD, s/p biological Bentall procedure w/ total large replacement of a large arch aneurysm. Mean AV grad WNL for bioprosthesis. No AR. Mod MR thickening. L pleural effusion.    ASSESSMENT AND PLAN:  1. HTN -BP is labile  at home. Recommend switching to diltiazem SR 90 mg bid. If BP remains elevated will increase Avapro to 150 mg and she should take in pm.   2. 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 Continue ASA.  3. HLD- patient is not on statin. Stopped taking on her own. Labs followed by primary care.  4. Mild LV dysfunction EF 45%- now resolved on last ECHO  5. Ascending aortic aneurysm s/p extensive Bental procedure with tissue AVR -- 2D ECHO ON  05/08/14 with normal LVF with stable AVR and ascending aortic replacement  6. Emotional lability with anxiety:  I have  recommended she see her primary care to initiate a treatment program.    Current medicines are reviewed at length with the patient today.  The patient does not have concerns regarding medicines.  The following changes have been made:  See above  Labs/ tests ordered today include: none     Disposition:   FU in 6 months.   Signed, Tayana Shankle Martinique, MD  08/31/2016 1:59 PM    [place]

## 2016-08-31 ENCOUNTER — Encounter: Payer: Self-pay | Admitting: Cardiology

## 2016-08-31 ENCOUNTER — Ambulatory Visit (INDEPENDENT_AMBULATORY_CARE_PROVIDER_SITE_OTHER): Payer: PRIVATE HEALTH INSURANCE | Admitting: Cardiology

## 2016-08-31 VITALS — BP 188/84 | HR 67 | Ht 60.0 in | Wt 104.0 lb

## 2016-08-31 DIAGNOSIS — Z952 Presence of prosthetic heart valve: Secondary | ICD-10-CM

## 2016-08-31 DIAGNOSIS — I712 Thoracic aortic aneurysm, without rupture, unspecified: Secondary | ICD-10-CM

## 2016-08-31 DIAGNOSIS — I10 Essential (primary) hypertension: Secondary | ICD-10-CM | POA: Diagnosis not present

## 2016-08-31 MED ORDER — DILTIAZEM HCL ER 90 MG PO CP12: 90.0000 mg | ORAL_CAPSULE | Freq: Two times a day (BID) | ORAL | 6 refills | Status: DC

## 2016-08-31 NOTE — Patient Instructions (Addendum)
We will switch your diltiazem formulation to SR 90 mg twice a day  Continue to monitor your blood pressure. If it remains high we may need to increase your irbesartan to 150 mg daily  I will see you in 6 months.

## 2016-09-13 ENCOUNTER — Other Ambulatory Visit: Payer: Self-pay | Admitting: *Deleted

## 2016-09-13 DIAGNOSIS — I10 Essential (primary) hypertension: Secondary | ICD-10-CM

## 2016-09-13 MED ORDER — DILTIAZEM HCL ER 90 MG PO CP12: 90.0000 mg | ORAL_CAPSULE | Freq: Two times a day (BID) | ORAL | 1 refills | Status: DC

## 2016-09-15 ENCOUNTER — Emergency Department (HOSPITAL_COMMUNITY): Payer: 59

## 2016-09-15 ENCOUNTER — Emergency Department (HOSPITAL_COMMUNITY)
Admission: EM | Admit: 2016-09-15 | Discharge: 2016-09-15 | Disposition: A | Discharge status: Home / Self Care | Payer: 59 | Attending: Emergency Medicine | Admitting: Emergency Medicine

## 2016-09-15 ENCOUNTER — Encounter (HOSPITAL_COMMUNITY): Payer: Self-pay | Admitting: Nurse Practitioner

## 2016-09-15 DIAGNOSIS — I10 Essential (primary) hypertension: Secondary | ICD-10-CM | POA: Diagnosis not present

## 2016-09-15 DIAGNOSIS — Y998 Other external cause status: Secondary | ICD-10-CM | POA: Insufficient documentation

## 2016-09-15 DIAGNOSIS — W010XXA Fall on same level from slipping, tripping and stumbling without subsequent striking against object, initial encounter: Secondary | ICD-10-CM | POA: Diagnosis not present

## 2016-09-15 DIAGNOSIS — Z79899 Other long term (current) drug therapy: Secondary | ICD-10-CM | POA: Diagnosis not present

## 2016-09-15 DIAGNOSIS — Y929 Unspecified place or not applicable: Secondary | ICD-10-CM | POA: Diagnosis not present

## 2016-09-15 DIAGNOSIS — I2510 Atherosclerotic heart disease of native coronary artery without angina pectoris: Secondary | ICD-10-CM | POA: Diagnosis not present

## 2016-09-15 DIAGNOSIS — Y9389 Activity, other specified: Secondary | ICD-10-CM | POA: Insufficient documentation

## 2016-09-15 DIAGNOSIS — S32512A Fracture of superior rim of left pubis, initial encounter for closed fracture: Secondary | ICD-10-CM | POA: Insufficient documentation

## 2016-09-15 DIAGNOSIS — Z7982 Long term (current) use of aspirin: Secondary | ICD-10-CM | POA: Insufficient documentation

## 2016-09-15 DIAGNOSIS — S32592A Other specified fracture of left pubis, initial encounter for closed fracture: Secondary | ICD-10-CM

## 2016-09-15 DIAGNOSIS — S79912A Unspecified injury of left hip, initial encounter: Secondary | ICD-10-CM | POA: Diagnosis present

## 2016-09-15 HISTORY — DX: Family history of other specified conditions: Z84.89

## 2016-09-15 HISTORY — DX: Personal history of other diseases of the digestive system: Z87.19

## 2016-09-15 NOTE — Discharge Instructions (Signed)
You may take 650 mg of Tylenol every 6 hours as needed for pain control. Please follow-up with Dr. Stann Mainland office if symptoms persist. Please return to the emergency department if you have any new symptoms or falls.

## 2016-09-15 NOTE — ED Notes (Signed)
Called care management for walker.

## 2016-09-15 NOTE — ED Notes (Signed)
Ortho PA, Hilbert Odor came by to see pt.

## 2016-09-15 NOTE — ED Notes (Signed)
Waiting for ortho to bring a walker for the pt. Before we discharge

## 2016-09-15 NOTE — ED Provider Notes (Signed)
[facility] DEPT Provider Note   CSN: [id number] Arrival date & time: 09/15/16  6761  By signing my name below, I, Theresia Bough, attest that this documentation has been prepared under the direction and in the presence of non-physician practitioner, McDonald, Mia A., PA-C. Electronically Signed: Theresia Bough, ED Scribe. 09/15/16. 7:54 PM.  History   Chief Complaint Chief Complaint  Patient presents with  . Leg Pain   The history is provided by the patient. No language interpreter was used.   HPI Comments: Yvonne Ballard is a 76 y.o. female who presents to the Emergency Department complaining of gradually worsening, 7/10 left hip pain s/p a mechanical, ground level fall that occurred 8 days ago. Pt was dragging a branch when it snapped and she lost her balance, falling backwards on to her behind. No LOC or head injury. Pt states she only has pain when she is weight bearing and no pain at rest. Pt has a cardiac history with a AAA, heart surgery and HTN. Pt also has a hx of a right wrist fracture that was not surgically repaired. No other complaints at this time.   Past Medical History:  Diagnosis Date  . Abnormal ECG   . Anxiety    pt. reports that she is nervous person  . Anxiety disorder   . Ascending aortic aneurysm [facility])    s/p repair with Bental procedure  . Coronary artery disease 03/2014   mild non obstructive CAD  . Dyslipidemia, goal LDL below 70 07/26/2014  . Family history of adverse reaction to anesthesia   . Heart murmur   . History of hiatal hernia   . Hypertension   . 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HCC)   . S/P AVR (aortic valve replacement)    tissue valve  . Weight loss     Patient Active Problem List   Diagnosis Date Noted  . CAD (coronary artery disease), native coronary artery 07/26/2014  . Dyslipidemia, goal LDL below 70 07/26/2014  . S/P AVR (aortic valve replacement)   . 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 Benign essential HTN  03/18/2014  . LVH (left ventricular hypertrophy) due to hypertensive disease 03/18/2014  . Heart murmur 03/18/2014    Past Surgical History:  Procedure Laterality Date  . BENTALL PROCEDURE N/A 03/31/2014   Procedure: BIO-BENTALL PROCEDURE using a 67mm Magna Ease Aortic Tissue Valve, a 26 mm Terumo Gelweave Valsalva Graft, a 14x10x63mm Terumo Gelweave Graft, and a 26mm straight Hemashield Platinum Graft.;  Surgeon: Ivin Poot, MD;  Location: [place];  Service: Open Heart Surgery;  Laterality: N/A;  . LEFT AND RIGHT HEART CATHETERIZATION WITH CORONARY ANGIOGRAM N/A 03/26/2014   Procedure: LEFT AND RIGHT HEART CATHETERIZATION WITH CORONARY ANGIOGRAM;  Surgeon: Peter M Martinique, MD;  Location: [facility] CATH LAB;  Service: Cardiovascular;  Laterality: N/A;  . TEE WITHOUT CARDIOVERSION N/A 03/31/2014   Procedure: TRANSESOPHAGEAL ECHOCARDIOGRAM (TEE);  Surgeon: Ivin Poot, MD;  Location: [place];  Service: Open Heart Surgery;  Laterality: N/A;  . TONSILLECTOMY    . TUBAL LIGATION      OB History    No data available       Home Medications    Prior to Admission medications   Medication Sig Start Date End Date Taking? Authorizing Provider  amoxicillin (AMOXIL) 500 MG capsule Take 2 Gms ( 4 tablets ) 1 hour before dental work 11/23/15   Martinique, Peter M, MD  aspirin 81 MG tablet Take 81 mg by mouth daily.  [provider]  diltiazem (CARDIZEM SR) 90 MG 12 hr capsule Take 1 capsule (90 mg total) by mouth 2 (two) times daily. 09/13/16   Martinique, Peter M, MD  irbesartan (AVAPRO) 150 MG tablet Take 0.5 tablets (75 mg total) by mouth 2 (two) times daily. 05/27/16   Martinique, Peter M, MD  Multiple Vitamin (MULTIVITAMIN WITH MINERALS) TABS tablet Take 1 tablet by mouth daily. Patient taking differently: Take 1 tablet by mouth every other day.  04/08/14   [name], PA-C    Family History Family History  Problem Relation Age of Onset  . Cancer Mother   . Cancer Father   . Cancer Brother      Social History Social History  Substance Use Topics  . Smoking status: Never Smoker  . Smokeless tobacco: Never Used  . Alcohol use Yes     Comment: " I like wine", - irregular consumption - socially has wine       Allergies   Patient has no known allergies.   Review of Systems Review of Systems  Constitutional: Negative for activity change.  Respiratory: Negative for shortness of breath.   Cardiovascular: Negative for chest pain.  Gastrointestinal: Negative for abdominal pain.  Musculoskeletal: Positive for arthralgias and myalgias. Negative for back pain.  Skin: Negative for rash.  Neurological: Negative for syncope.   Physical Exam Updated Vital Signs BP (!) 179/87 (BP Location: Right Arm)   Pulse 81   Temp 98.2 F (36.8 C) (Oral)   Resp 17   Ht 5' (1.524 m)   Wt 47.2 kg (104 lb)   SpO2 98%   BMI 20.31 kg/m   Physical Exam  Constitutional: No distress.  HENT:  Head: Normocephalic.  Eyes: Conjunctivae are normal.  Neck: Neck supple.  Cardiovascular: Normal rate and regular rhythm.  Exam reveals no gallop and no friction rub.   Murmur heard. Pulmonary/Chest: Effort normal. No respiratory distress.  Abdominal: Soft. She exhibits no distension.  Musculoskeletal: She exhibits tenderness.  TTP diffusely to the left lateral pelvis. No TTP over the left hip. No right pelvis or hip tenderness. TTP over the C,T, or L spine. 2+ DP and PT pulses. 5/5 strength of the bilateral lower extremities. No sensory deficits.     Neurological: She is alert.  Skin: Skin is warm. No rash noted.  Psychiatric: Her behavior is normal.  Nursing note and vitals reviewed.  ED Treatments / Results  DIAGNOSTIC STUDIES: Oxygen Saturation is 100% on RA, normal by my interpretation.   COORDINATION OF CARE: 12:21 PM-Discussed next steps with pt including speaking with Ortho. Pt verbalized understanding and is agreeable with the plan.   1:58 PM Consulted with an orthopedic  specialist, Ambrose Finland., PA-C,  who recommends Conservative management with discharge to home and follow-up to Ortho as needed. He has ordered the patient a walker for home.   Labs (all labs ordered are listed, but only abnormal results are displayed) Labs Reviewed - No data to display  EKG  EKG Interpretation None       Radiology Dg Hip Unilat W Or Wo Pelvis 2-3 Views Left  Result Date: 09/15/2016 CLINICAL DATA:  S post fall last week. Persistent left hip pain. Symptoms have worsened. EXAM: DG HIP (WITH OR WITHOUT PELVIS) 2-3V LEFT COMPARISON:  No recent studies in [facility] FINDINGS: The bones are subjectively osteopenic. The patient has sustained acute minimally displaced fractures of the lateral aspect of the left superior pubic ramus and the mid portion of  the inferior pubic ramus. The remainder of the left hemipelvis appears normal. AP and lateral views of the left hip reveal preservation of the joint space. The articular surfaces of the femoral head and acetabulum remain smoothly rounded. The left femoral neck, intertrochanteric, and subtrochanteric regions are normal. The right hemipelvis and right hip are grossly intact. IMPRESSION: Acute minimally displaced fractures of the lateral aspect of the left superior pubic ramus in the midbody of the left inferior pubic ramus. No acute hip fracture. Electronically Signed   By: David  Martinique M.D.   On: 09/15/2016 12:06    Procedures Procedures (including critical care time)  Medications Ordered in ED Medications - No data to display   Initial Impression / Assessment and Plan / ED Course  I have reviewed the triage vital signs and the nursing notes.  Pertinent labs & imaging results that were available during my care of the patient were reviewed by me and considered in my medical decision making (see chart for details).     Patient X-Ray positive for acute minimally displaced fractures to the lateral aspect of the left superior pubic  ramus and the mid portion of the inferior pubic ramus. The patient was evaluated with Dr. Canary Brim, attending physician. Consulted orthopedic surgery who recommended outpatient conservative management given that the patient has been mobile and active for the last week since the incident causing fracture. Ortho so has ordered a new walker for the patient to use at home. Pt advised to follow up with orthopedics. Conservative therapy recommended and discussed. Patient will be discharged home & is agreeable with above plan. Returns precautions discussed. Pt appears safe for discharge.  Final Clinical Impressions(s) / ED Diagnoses   Final diagnoses:  Closed fracture of ramus of left pubis, initial encounter [facility])    New Prescriptions Discharge Medication List as of 09/15/2016  2:22 PM     I personally performed the services described in this documentation, which was scribed in my presence. The recorded information has been reviewed and is accurate.     Joanne Gavel, PA-C 09/15/16 Tomma Lightning, MD 09/17/16 [phone]

## 2016-09-15 NOTE — ED Notes (Signed)
Pt denies any other pain other than left buttock pain.

## 2016-09-15 NOTE — ED Triage Notes (Addendum)
Patient endorses left hip pain for 2 weeks. Patient endorses fall from slipping onto ground when she was pulling something and feel backwards onto bottom.  No LOC. Patient has been ambulatory but has needed a cane- which she has not had to use previously. Patient states thought pain would resolve on its own but she notes it has been worsening over the past two days. Patient states pain states hip hurts with movement and weight bearing.

## 2016-09-15 NOTE — ED Notes (Signed)
Pt here for pain L ischial area since fall onto driveway June [id number]. Pt seems to have exquisite pain with any weight bearing or movement.

## 2016-09-15 NOTE — ED Notes (Signed)
Patient transported to X-ray 

## 2016-09-15 NOTE — ED Notes (Signed)
Dr. Stann Mainland in.

## 2016-09-15 NOTE — Consult Note (Signed)
Reason for Consult:Pelvic fxs Referring Physician: Lulubelle Ballard is an 76 y.o. female.  HPI: Yvonne Ballard was pulling on a tree branch 2d ago when she lost her balance and fell onto her bottom. She had some mild pain but tried to push through it. When she was still having significant pain she came to the ED for evaluation. X-rays showed pubic rami fxs and orthopedic surgery was consulted.  Past Medical History:  Diagnosis Date  . Abnormal ECG   . Anxiety    pt. reports that she is nervous person  . Anxiety disorder   . Ascending aortic aneurysm [facility])    s/p repair with Bental procedure  . Coronary artery disease 03/2014   mild non obstructive CAD  . Dyslipidemia, goal LDL below 70 07/26/2014  . Family history of adverse reaction to anesthesia   . Heart murmur   . History of hiatal hernia   . Hypertension   . 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HCC)   . S/P AVR (aortic valve replacement)    tissue valve  . Weight loss     Past Surgical History:  Procedure Laterality Date  . BENTALL PROCEDURE N/A 03/31/2014   Procedure: BIO-BENTALL PROCEDURE using a 65mm Magna Ease Aortic Tissue Valve, a 26 mm Terumo Gelweave Valsalva Graft, a 14x10x74mm Terumo Gelweave Graft, and a 60mm straight Hemashield Platinum Graft.;  Surgeon: Ivin Poot, MD;  Location: [place];  Service: Open Heart Surgery;  Laterality: N/A;  . LEFT AND RIGHT HEART CATHETERIZATION WITH CORONARY ANGIOGRAM N/A 03/26/2014   Procedure: LEFT AND RIGHT HEART CATHETERIZATION WITH CORONARY ANGIOGRAM;  Surgeon: Peter M Martinique, MD;  Location: [facility] CATH LAB;  Service: Cardiovascular;  Laterality: N/A;  . TEE WITHOUT CARDIOVERSION N/A 03/31/2014   Procedure: TRANSESOPHAGEAL ECHOCARDIOGRAM (TEE);  Surgeon: Ivin Poot, MD;  Location: [place];  Service: Open Heart Surgery;  Laterality: N/A;  . TONSILLECTOMY    . TUBAL LIGATION      Family History  Problem Relation Age of Onset  . Cancer Mother   . Cancer Father   . Cancer Brother      Social History:  reports that she has never smoked. She has never used smokeless tobacco. She reports that she drinks alcohol. She reports that she does not use drugs.  Allergies: No Known Allergies  Medications: I have reviewed the patient's current medications.  No results found for this or any previous visit (from the past 48 hour(s)).  Dg Hip Unilat W Or Wo Pelvis 2-3 Views Left  Result Date: 09/15/2016 CLINICAL DATA:  S post fall last week. Persistent left hip pain. Symptoms have worsened. EXAM: DG HIP (WITH OR WITHOUT PELVIS) 2-3V LEFT COMPARISON:  No recent studies in [facility] FINDINGS: The bones are subjectively osteopenic. The patient has sustained acute minimally displaced fractures of the lateral aspect of the left superior pubic ramus and the mid portion of the inferior pubic ramus. The remainder of the left hemipelvis appears normal. AP and lateral views of the left hip reveal preservation of the joint space. The articular surfaces of the femoral head and acetabulum remain smoothly rounded. The left femoral neck, intertrochanteric, and subtrochanteric regions are normal. The right hemipelvis and right hip are grossly intact. IMPRESSION: Acute minimally displaced fractures of the lateral aspect of the left superior pubic ramus in the midbody of the left inferior pubic ramus. No acute hip fracture. Electronically Signed   By: Yvonne  Ballard M.D.   On: 09/15/2016 12:06    Review  of Systems  Constitutional: Negative for weight loss.  HENT: Negative for ear discharge, ear pain, hearing loss and tinnitus.   Eyes: Negative for blurred vision, double vision, photophobia and pain.  Respiratory: Negative for cough, sputum production and shortness of breath.   Cardiovascular: Negative for chest pain.  Gastrointestinal: Negative for abdominal pain, nausea and vomiting.  Genitourinary: Negative for dysuria, flank pain, frequency and urgency.  Musculoskeletal: Positive for joint pain (Pelvis).  Negative for back pain, falls, myalgias and neck pain.  Neurological: Negative for dizziness, tingling, sensory change, focal weakness, loss of consciousness and headaches.  Endo/Heme/Allergies: Does not bruise/bleed easily.  Psychiatric/Behavioral: Negative for depression, memory loss and substance abuse. The patient is not nervous/anxious.    Blood pressure (!) 179/87, pulse 81, temperature 98.2 F (36.8 C), temperature source Oral, resp. rate 17, height 5' (1.524 m), weight 47.2 kg (104 lb), SpO2 98 %. Physical Exam  Constitutional: She appears well-developed and well-nourished. No distress.  HENT:  Head: Normocephalic.  Eyes: Conjunctivae are normal. Right eye exhibits no discharge. Left eye exhibits no discharge. No scleral icterus.  Cardiovascular: Normal rate and regular rhythm.   Respiratory: Effort normal. No respiratory distress.  Musculoskeletal:  Pelvis--no traumatic wounds or rash, no ecchymosis, stable to manual stress, mild TTP left  BLE No traumatic wounds, ecchymosis, or rash  Nontender  No effusions  Knee stable to varus/ valgus and anterior/posterior stress  Sens DPN, SPN, TN intact  Motor EHL, ext, flex, evers 5/5  DP 2+, PT 2+, No significant edema  Neurological: She is alert.  Skin: Skin is warm and dry. She is not diaphoretic.  Psychiatric: She has a normal mood and affect. Her behavior is normal.    Assessment/Plan: Fall Left sup/inf rami fxs -- Non-operative and can WBAT. Will get her a RW to aid in ambulation for the next month. Ok to d/c home.    Yvonne Abu, PA-C Orthopedic Surgery [phone] 09/15/2016, 2:16 PM

## 2016-10-17 IMAGING — CR DG CHEST 2V
2 series · 2 of 2 positions shown · non-contrast
Comparison: CT 03/24/2014

CLINICAL DATA: Preop for heart valve in aneurysm repair.
Hypertension.

EXAM:
CHEST  2 VIEW

[w chest pa]
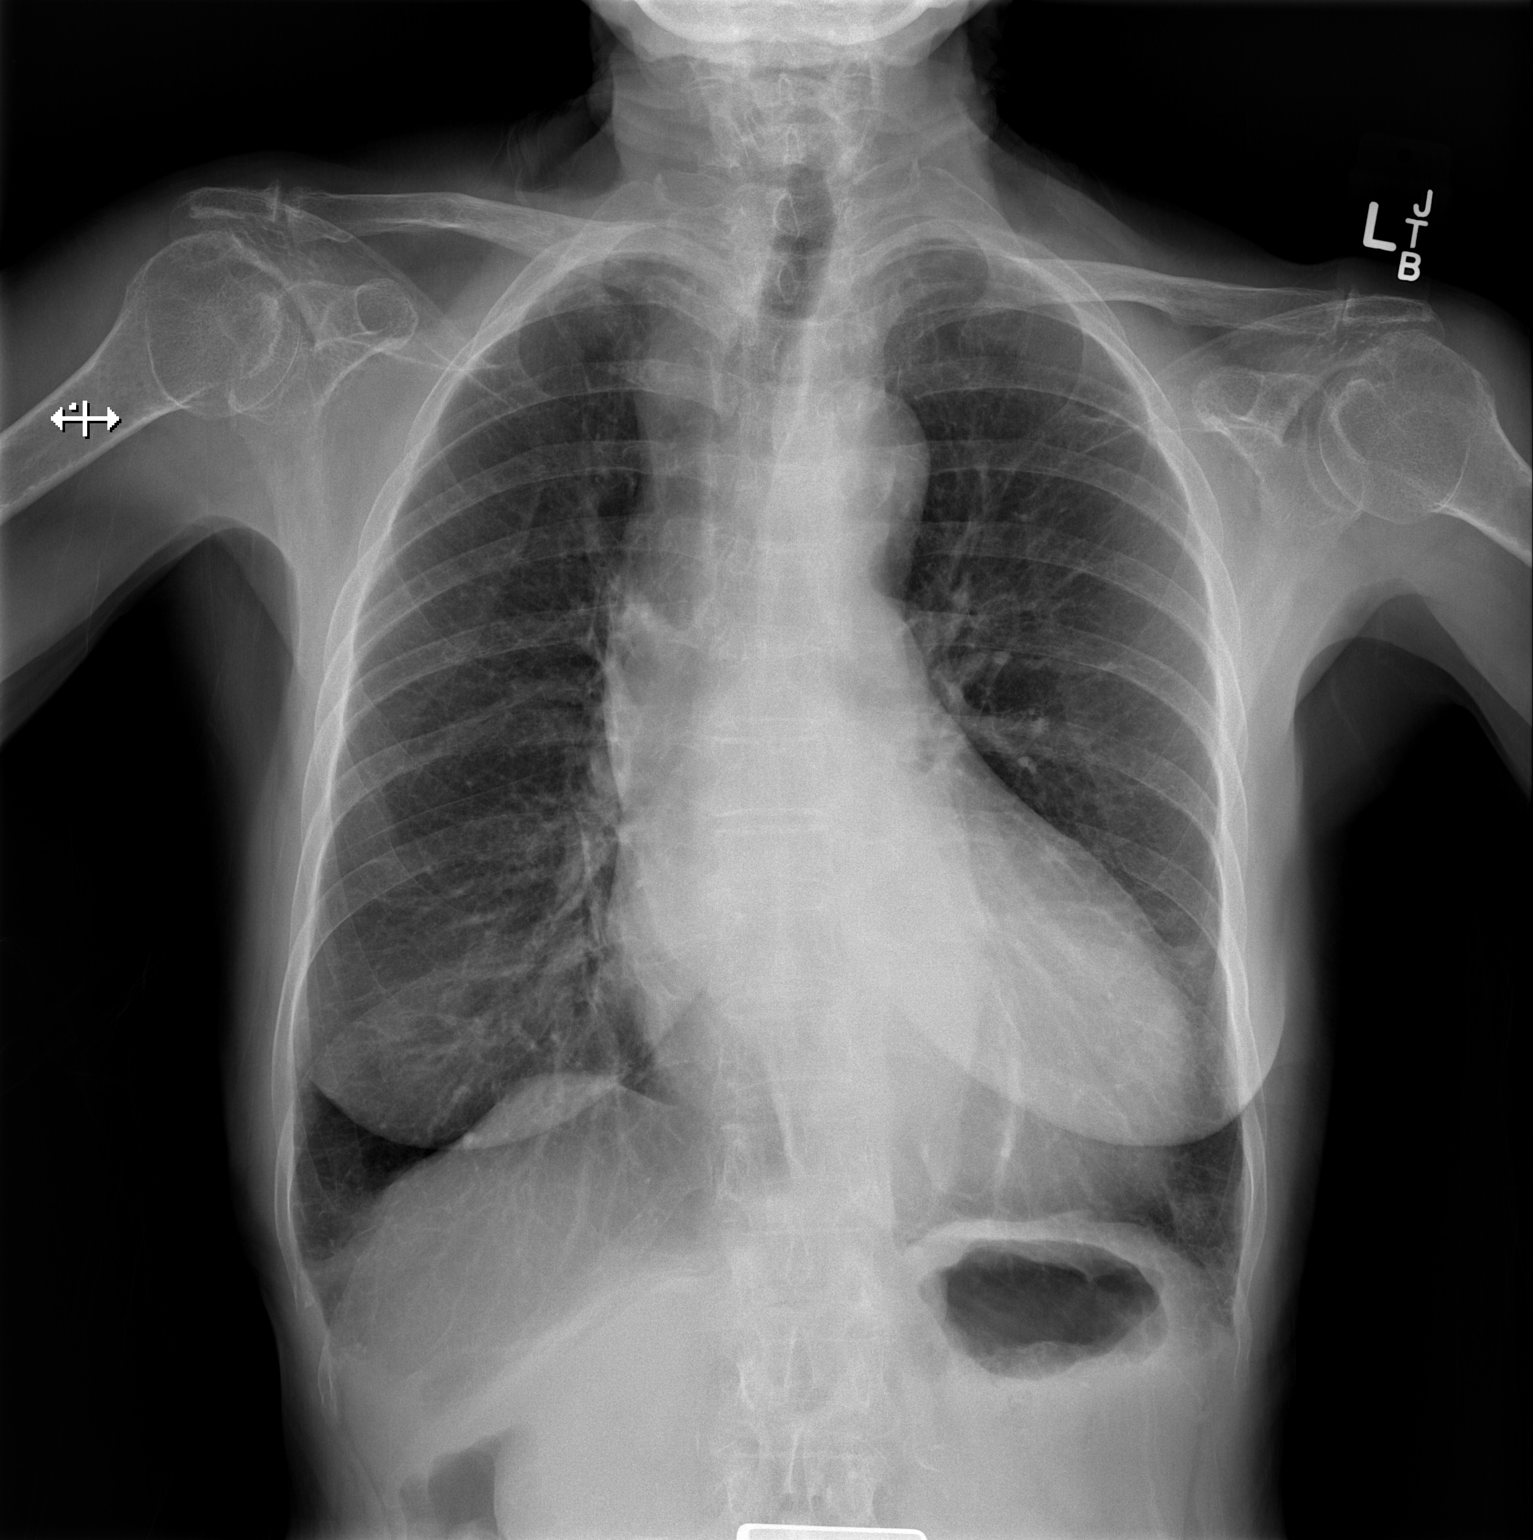

[w chest lat]
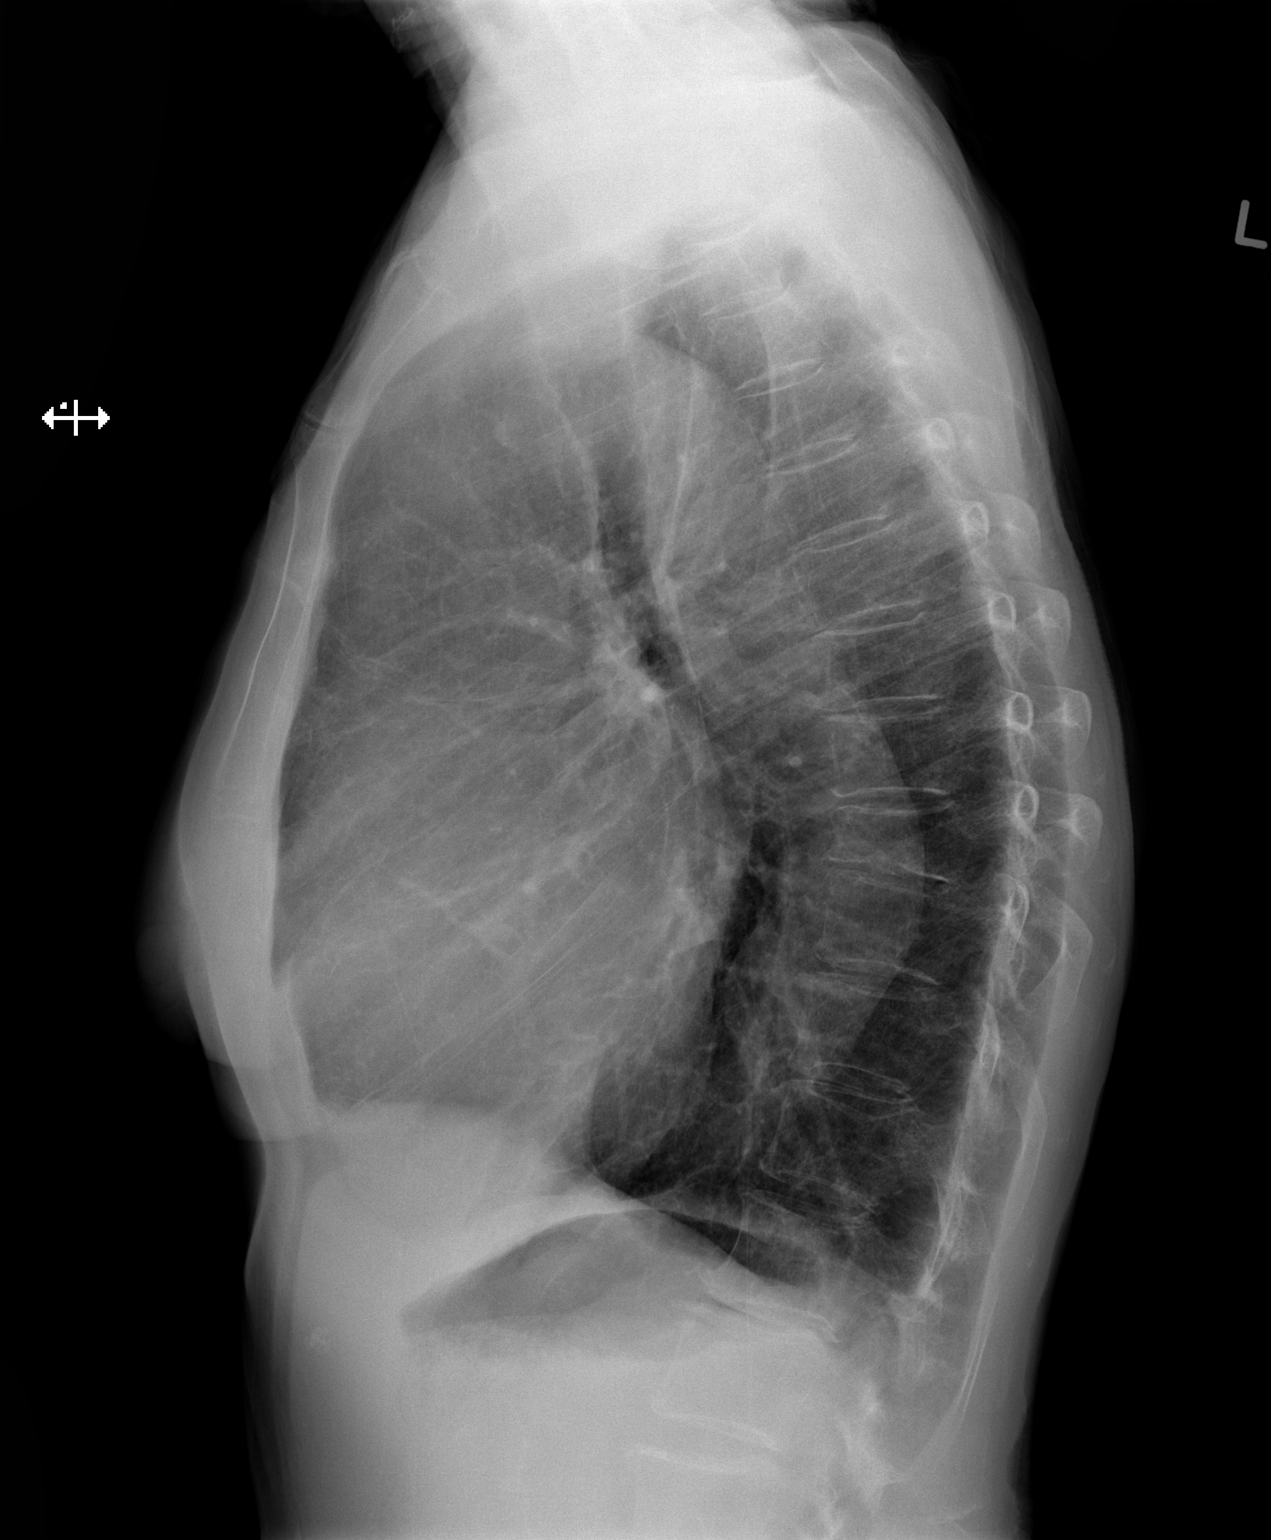

[2 of 2 positions shown; findings below may reference images not displayed]

FINDINGS: Mild hyperinflation. T9 compression deformity as detailed on CT.
Mild to moderate. Osteopenia. Midline trachea. Moderate
cardiomegaly. Ascending aortic aneurysm with aortic tortuosity
throughout. No pleural effusion or pneumothorax. Mild bibasilar
scarring with lower lobe predominant interstitial thickening,
nonspecific.
IMPRESSION: No acute cardiopulmonary disease.

Cardiomegaly without congestive failure.

Aortic aneurysm, as before.

## 2016-10-20 IMAGING — CR DG CHEST 1V PORT
1 series · 1 of 1 positions shown · non-contrast
Comparison: 03/28/2014

CLINICAL DATA: Subsequent encounter for incorrect needle count.
Median sternotomy.

EXAM:
PORTABLE CHEST - 1 VIEW

[AP]
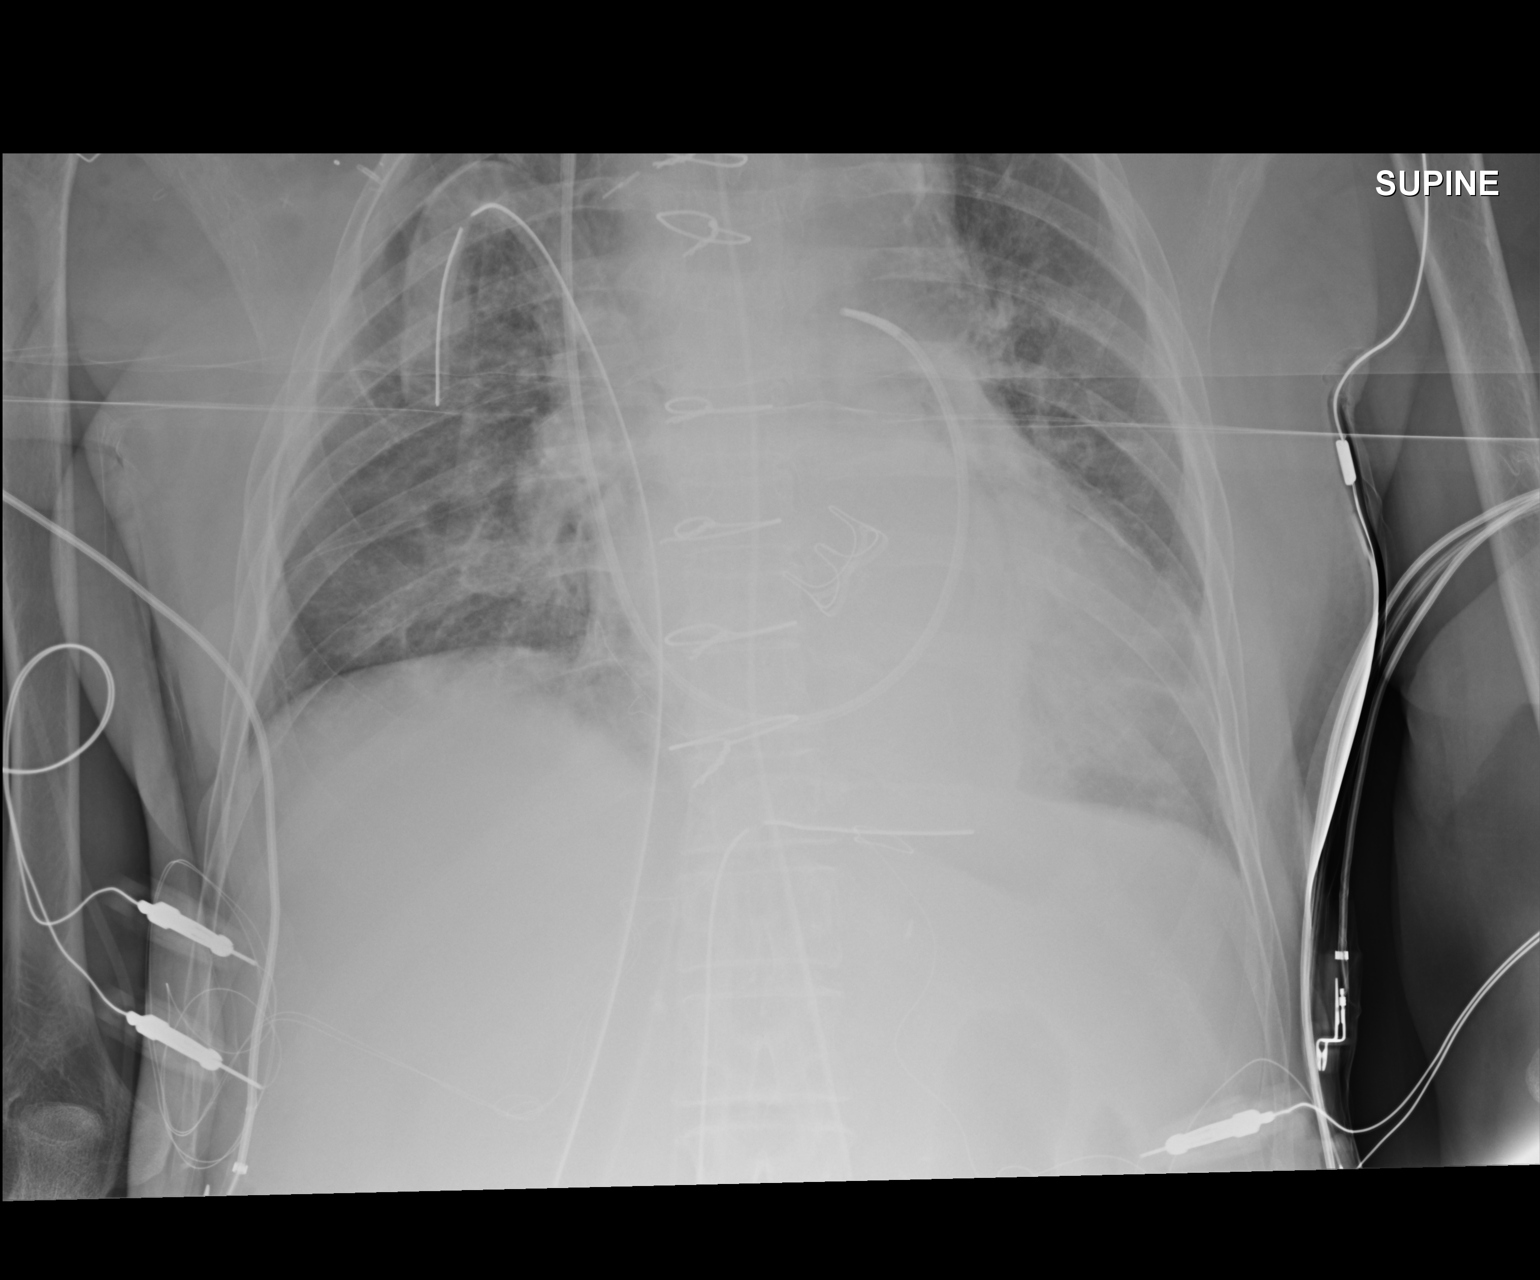

[1 of 1 positions shown; findings below may reference images not displayed]

FINDINGS: Endotracheal tube terminates approximately 5.3 cm above the carina.
Interval median sternotomy. Swan-Ganz catheter with tip the
pulmonary outflow tract. Mediastinal drain. Bilateral chest tubes.

Surgical clips which project over the right axilla. Metallic object
projects to the lung right of the endotracheal tube and is
incompletely imaged. Epicardial pacer wires.

Linear metallic object projects between and to the right of the
first and second median sternotomy wires.

Cardiomegaly accentuated by AP portable technique. Aortic valve
repair. Right hemidiaphragm elevation. No pleural effusion or
pneumothorax. Mild pulmonary venous congestion, superimposed upon
low lung volumes. Patchy left base atelectasis.
IMPRESSION: Extensive surgical changes, as detailed above. No definite medial
identified. Metallic objects as detailed above.

Otherwise, expected appearance after median sternotomy with low lung
volumes and mild pulmonary venous congestion.

Report called to the operating room at [DATE] p.m..

## 2016-10-20 IMAGING — CR DG CHEST 1V PORT
1 series · 1 of 1 positions shown · non-contrast
Comparison: 01/30/2015

CLINICAL DATA: Status post thoracic aortic aneurysm repair

EXAM:
PORTABLE CHEST - 1 VIEW

[AP]
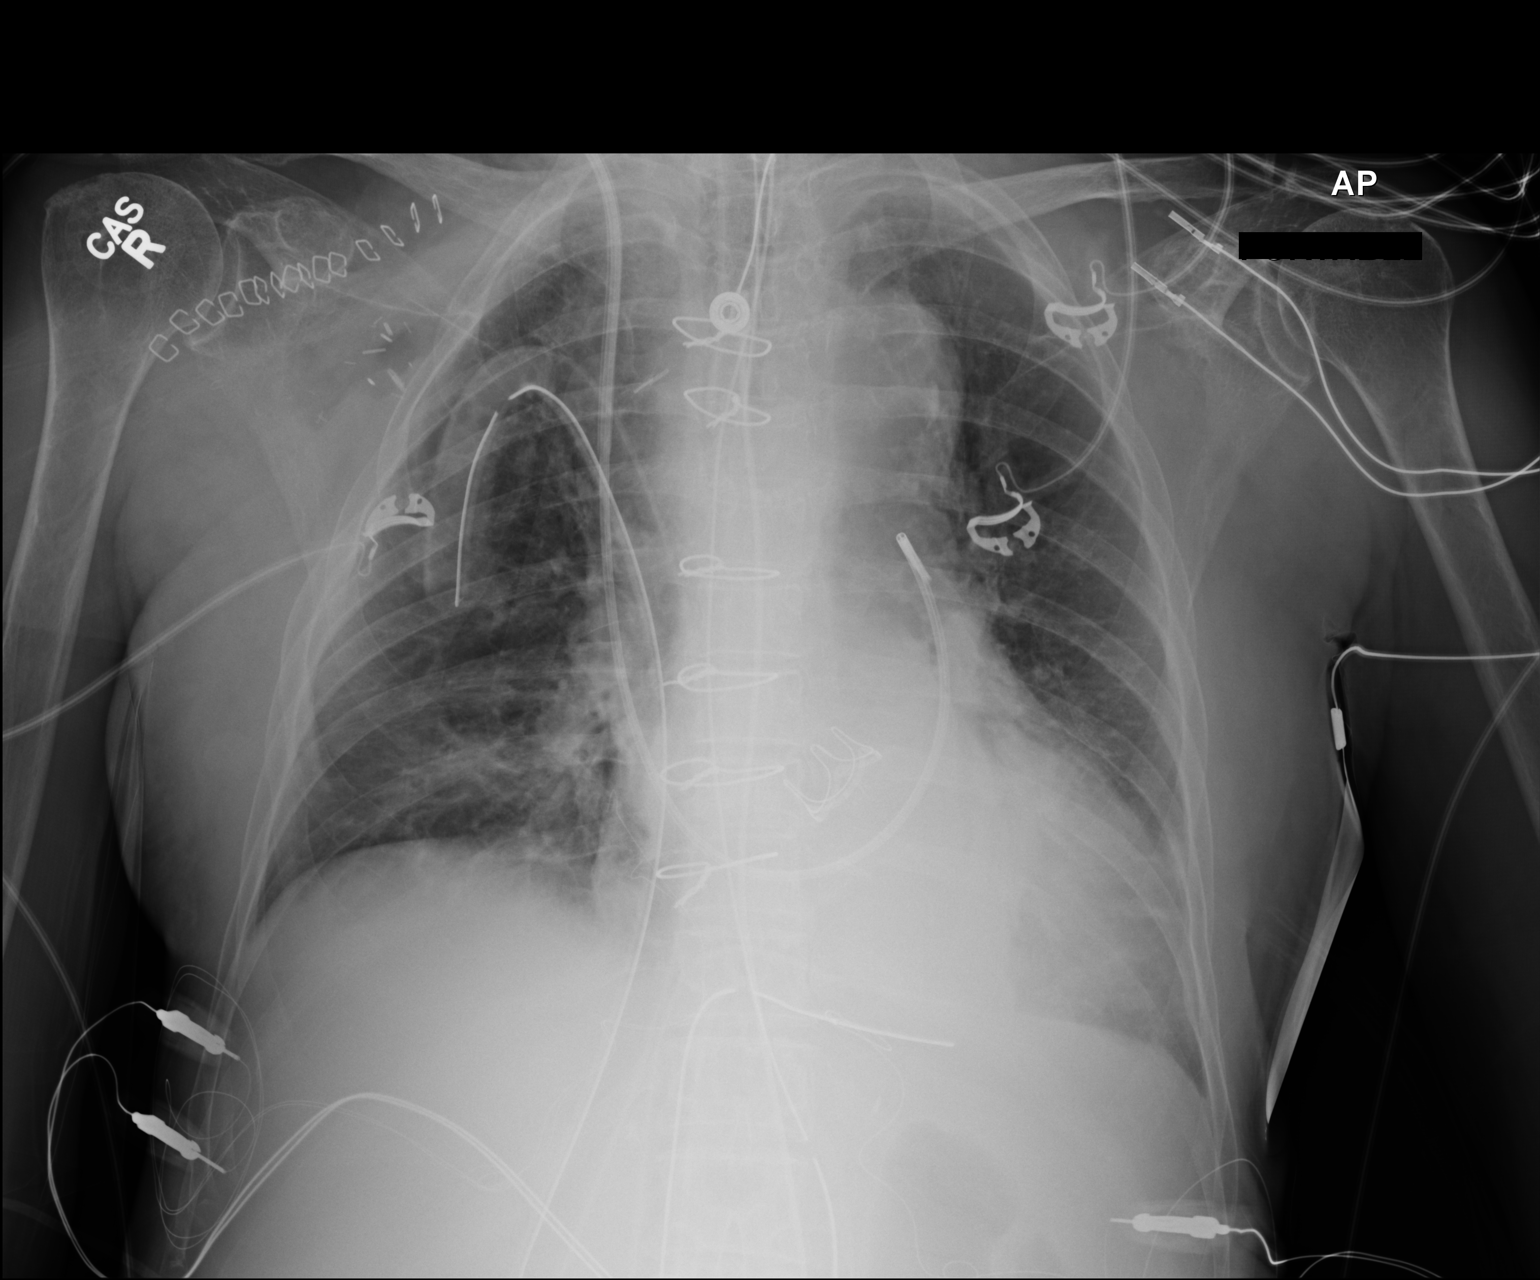

[1 of 1 positions shown; findings below may reference images not displayed]

FINDINGS: Endotracheal tube with the tip 2 cm above the carina. Bilateral
chest tubes are noted. Swan-Ganz catheter with the tip at the level
of the right ventricular outflow tract. Mediastinal drains are
noted.

There are surgical clips over the right axilla. There is soft tissue
air in the right axilla from recent surgery.

Median sternotomy wires are noted. Stable cardiomediastinal
silhouette. No pleural effusion or pneumothorax. Mild pulmonary
vascular congestion. Bibasilar mild atelectasis.

No acute osseous abnormality.
IMPRESSION: 1. Support lines and tubing in unchanged position as detailed above.
2. Mild pulmonary vascular congestion.  Bibasilar atelectasis.

## 2017-02-07 ENCOUNTER — Emergency Department (HOSPITAL_COMMUNITY): Payer: Medicare Other

## 2017-02-07 ENCOUNTER — Encounter (HOSPITAL_COMMUNITY): Payer: Self-pay | Admitting: Neurology

## 2017-02-07 ENCOUNTER — Observation Stay (HOSPITAL_COMMUNITY)
Admission: EM | Admit: 2017-02-07 | Discharge: 2017-02-08 | Disposition: A | Discharge status: Home / Self Care | Payer: Medicare Other | Attending: Family Medicine | Admitting: Family Medicine

## 2017-02-07 ENCOUNTER — Observation Stay (HOSPITAL_COMMUNITY): Payer: Medicare Other

## 2017-02-07 ENCOUNTER — Other Ambulatory Visit: Payer: Self-pay

## 2017-02-07 DIAGNOSIS — Z7982 Long term (current) use of aspirin: Secondary | ICD-10-CM | POA: Diagnosis not present

## 2017-02-07 DIAGNOSIS — E785 Hyperlipidemia, unspecified: Secondary | ICD-10-CM | POA: Diagnosis not present

## 2017-02-07 DIAGNOSIS — I119 Hypertensive heart disease without heart failure: Secondary | ICD-10-CM | POA: Diagnosis present

## 2017-02-07 DIAGNOSIS — I2510 Atherosclerotic heart disease of native coronary artery without angina pectoris: Secondary | ICD-10-CM | POA: Diagnosis present

## 2017-02-07 DIAGNOSIS — I712 Thoracic aortic aneurysm, without rupture, unspecified: Secondary | ICD-10-CM | POA: Diagnosis present

## 2017-02-07 DIAGNOSIS — R29810 Facial weakness: Secondary | ICD-10-CM | POA: Diagnosis present

## 2017-02-07 DIAGNOSIS — Z79899 Other long term (current) drug therapy: Secondary | ICD-10-CM | POA: Insufficient documentation

## 2017-02-07 DIAGNOSIS — I639 Cerebral infarction, unspecified: Principal | ICD-10-CM

## 2017-02-07 DIAGNOSIS — K449 Diaphragmatic hernia without obstruction or gangrene: Secondary | ICD-10-CM | POA: Insufficient documentation

## 2017-02-07 DIAGNOSIS — F419 Anxiety disorder, unspecified: Secondary | ICD-10-CM | POA: Insufficient documentation

## 2017-02-07 DIAGNOSIS — I6300 Cerebral infarction due to thrombosis of unspecified precerebral artery: Secondary | ICD-10-CM

## 2017-02-07 DIAGNOSIS — I10 Essential (primary) hypertension: Secondary | ICD-10-CM | POA: Diagnosis not present

## 2017-02-07 HISTORY — DX: Cerebral infarction, unspecified: I63.9

## 2017-02-07 LAB — DIFFERENTIAL
BASOS PCT: 0 %
Basophils Absolute: 0 10*3/uL (ref 0.0–0.1)
EOS PCT: 0 %
Eosinophils Absolute: 0 10*3/uL (ref 0.0–0.7)
Lymphocytes Relative: 25 %
Lymphs Abs: 1.5 10*3/uL (ref 0.7–4.0)
MONO ABS: 0.3 10*3/uL (ref 0.1–1.0)
MONOS PCT: 5 %
Neutro Abs: 4.1 10*3/uL (ref 1.7–7.7)
Neutrophils Relative %: 70 %

## 2017-02-07 LAB — URINALYSIS, ROUTINE W REFLEX MICROSCOPIC
BILIRUBIN URINE: NEGATIVE
Glucose, UA: NEGATIVE mg/dL
Hgb urine dipstick: NEGATIVE
Ketones, ur: NEGATIVE mg/dL
Leukocytes, UA: NEGATIVE
Nitrite: NEGATIVE
PH: 7 (ref 5.0–8.0)
Protein, ur: NEGATIVE mg/dL
SPECIFIC GRAVITY, URINE: 1.006 (ref 1.005–1.030)

## 2017-02-07 LAB — RAPID URINE DRUG SCREEN, HOSP PERFORMED
AMPHETAMINES: NOT DETECTED
Barbiturates: NOT DETECTED
Benzodiazepines: NOT DETECTED
Cocaine: NOT DETECTED
OPIATES: NOT DETECTED
Tetrahydrocannabinol: NOT DETECTED

## 2017-02-07 LAB — CBC
HCT: 36.8 % (ref 36.0–46.0)
Hemoglobin: 12.2 g/dL (ref 12.0–15.0)
MCH: 30.7 pg (ref 26.0–34.0)
MCHC: 33.2 g/dL (ref 30.0–36.0)
MCV: 92.5 fL (ref 78.0–100.0)
PLATELETS: 185 10*3/uL (ref 150–400)
RBC: 3.98 MIL/uL (ref 3.87–5.11)
RDW: 13.4 % (ref 11.5–15.5)
WBC: 6 10*3/uL (ref 4.0–10.5)

## 2017-02-07 LAB — COMPREHENSIVE METABOLIC PANEL
ALT: 14 U/L (ref 14–54)
ANION GAP: 9 (ref 5–15)
AST: 22 U/L (ref 15–41)
Albumin: 3.8 g/dL (ref 3.5–5.0)
Alkaline Phosphatase: 99 U/L (ref 38–126)
BUN: 11 mg/dL (ref 6–20)
CALCIUM: 8.8 mg/dL — AB (ref 8.9–10.3)
CHLORIDE: 103 mmol/L (ref 101–111)
CO2: 25 mmol/L (ref 22–32)
CREATININE: 0.65 mg/dL (ref 0.44–1.00)
Glucose, Bld: 142 mg/dL — ABNORMAL HIGH (ref 65–99)
Potassium: 3.2 mmol/L — ABNORMAL LOW (ref 3.5–5.1)
SODIUM: 137 mmol/L (ref 135–145)
Total Bilirubin: 0.3 mg/dL (ref 0.3–1.2)
Total Protein: 6.3 g/dL — ABNORMAL LOW (ref 6.5–8.1)

## 2017-02-07 LAB — I-STAT CHEM 8, ED
BUN: 13 mg/dL (ref 6–20)
CALCIUM ION: 1.13 mmol/L — AB (ref 1.15–1.40)
CHLORIDE: 102 mmol/L (ref 101–111)
Creatinine, Ser: 0.6 mg/dL (ref 0.44–1.00)
GLUCOSE: 142 mg/dL — AB (ref 65–99)
HEMATOCRIT: 36 % (ref 36.0–46.0)
HEMOGLOBIN: 12.2 g/dL (ref 12.0–15.0)
Potassium: 3.2 mmol/L — ABNORMAL LOW (ref 3.5–5.1)
Sodium: 139 mmol/L (ref 135–145)
TCO2: 27 mmol/L (ref 22–32)

## 2017-02-07 LAB — APTT: aPTT: 30 seconds (ref 24–36)

## 2017-02-07 LAB — ETHANOL

## 2017-02-07 LAB — PROTIME-INR
INR: 0.96
PROTHROMBIN TIME: 12.7 s (ref 11.4–15.2)

## 2017-02-07 LAB — I-STAT TROPONIN, ED: TROPONIN I, POC: 0 ng/mL (ref 0.00–0.08)

## 2017-02-07 LAB — MAGNESIUM: MAGNESIUM: 2.1 mg/dL (ref 1.7–2.4)

## 2017-02-07 MED ORDER — ATORVASTATIN CALCIUM 80 MG PO TABS: 80.0000 mg | ORAL_TABLET | Freq: Every day | ORAL | Status: DC

## 2017-02-07 MED ORDER — HYDRALAZINE HCL 20 MG/ML IJ SOLN: 5.0000 mg | Freq: Three times a day (TID) | INTRAMUSCULAR | Status: DC | PRN

## 2017-02-07 MED ORDER — ASPIRIN 81 MG PO CHEW
81.0000 mg | CHEWABLE_TABLET | Freq: Once | ORAL | Status: AC
  Administered 2017-02-08: 81 mg via ORAL
  Filled 2017-02-07: qty 1

## 2017-02-07 MED ORDER — LORAZEPAM 2 MG/ML IJ SOLN
0.5000 mg | Freq: Once | INTRAMUSCULAR | Status: AC
  Administered 2017-02-07: 0.5 mg via INTRAVENOUS
  Filled 2017-02-07: qty 1

## 2017-02-07 MED ORDER — STROKE: EARLY STAGES OF RECOVERY BOOK
Freq: Once | Status: AC
  Administered 2017-02-07: 15:00:00
  Filled 2017-02-07: qty 1

## 2017-02-07 MED ORDER — POTASSIUM CHLORIDE CRYS ER 20 MEQ PO TBCR
40.0000 meq | EXTENDED_RELEASE_TABLET | Freq: Once | ORAL | Status: AC
  Administered 2017-02-08: 40 meq via ORAL
  Filled 2017-02-07: qty 2

## 2017-02-07 MED ORDER — GADOBENATE DIMEGLUMINE 529 MG/ML IV SOLN
15.0000 mL | Freq: Once | INTRAVENOUS | Status: AC
  Administered 2017-02-07: 12 mL via INTRAVENOUS

## 2017-02-07 MED ORDER — SODIUM CHLORIDE 0.9 % IV SOLN
INTRAVENOUS | Status: DC
  Administered 2017-02-08: 10:00:00 via INTRAVENOUS

## 2017-02-07 MED ORDER — HEPARIN SODIUM (PORCINE) 5000 UNIT/ML IJ SOLN
5000.0000 [IU] | Freq: Three times a day (TID) | INTRAMUSCULAR | Status: DC
  Administered 2017-02-07 – 2017-02-08 (×3): 5000 [IU] via SUBCUTANEOUS
  Filled 2017-02-07 (×3): qty 1

## 2017-02-07 MED ORDER — ASPIRIN EC 81 MG PO TBEC
81.0000 mg | DELAYED_RELEASE_TABLET | Freq: Every day | ORAL | Status: DC
  Administered 2017-02-08: 81 mg via ORAL
  Filled 2017-02-07: qty 1

## 2017-02-07 MED ORDER — IRBESARTAN 75 MG PO TABS
75.0000 mg | ORAL_TABLET | Freq: Two times a day (BID) | ORAL | Status: DC
  Administered 2017-02-07 – 2017-02-08 (×2): 75 mg via ORAL
  Filled 2017-02-07 (×3): qty 1

## 2017-02-07 MED ORDER — DILTIAZEM HCL ER 90 MG PO CP12
90.0000 mg | ORAL_CAPSULE | Freq: Two times a day (BID) | ORAL | Status: DC
  Administered 2017-02-07 – 2017-02-08 (×2): 90 mg via ORAL
  Filled 2017-02-07 (×3): qty 1

## 2017-02-07 MED ORDER — SENNOSIDES-DOCUSATE SODIUM 8.6-50 MG PO TABS: 1.0000 | ORAL_TABLET | Freq: Every evening | ORAL | Status: DC | PRN

## 2017-02-07 NOTE — ED Provider Notes (Signed)
[facility] EMERGENCY DEPARTMENT Provider Note   CSN: [id number] Arrival date & time: 02/07/17  1207     History   Chief Complaint Chief Complaint  Patient presents with  . Weakness    HPI Yvonne Ballard is a 76 y.o. female.  HPI  A LEVEL 5 CAVEAT PERTAINS DUE TO URGENT NEED FOR INTERVENTION.  Patient presenting as a code stroke notification which was called in triage.  She reported that she developed left-sided facial droop and left hand weakness morning at 10 AM.  She initially reported she woke up at 5 AM and felt normal and symptoms began after that.  Past Medical History:  Diagnosis Date  . Abnormal ECG   . Anxiety    pt. reports that she is nervous person  . Anxiety disorder   . Ascending aortic aneurysm [facility])    s/p repair with Bental procedure  . Coronary artery disease 03/2014   mild non obstructive CAD  . Dyslipidemia, goal LDL below 70 07/26/2014  . Family history of adverse reaction to anesthesia   . Heart murmur   . History of hiatal hernia   . Hypertension   . 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HCC)   . S/P AVR (aortic valve replacement)    tissue valve  . Weight loss     Patient Active Problem List   Diagnosis Date Noted  . Anxiety 02/07/2017  . Hiatal hernia 02/07/2017  . 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 CAD (coronary artery disease), native coronary artery 07/26/2014  . Dyslipidemia, goal LDL below 70 07/26/2014  . S/P AVR (aortic valve replacement)   . 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 Benign essential HTN 03/18/2014  . LVH (left ventricular hypertrophy) due to hypertensive disease 03/18/2014  . Heart murmur 03/18/2014    Past Surgical History:  Procedure Laterality Date  . BENTALL PROCEDURE N/A 03/31/2014   Procedure: BIO-BENTALL PROCEDURE using a 66mm Magna Ease Aortic Tissue Valve, a 26 mm Terumo Gelweave Valsalva Graft, a 14x10x70mm Terumo Gelweave Graft, and a 48mm straight Hemashield Platinum Graft.;   Surgeon: Ivin Poot, MD;  Location: [facility];  Service: Open Heart Surgery;  Laterality: N/A;  . LEFT AND RIGHT HEART CATHETERIZATION WITH CORONARY ANGIOGRAM N/A 03/26/2014   Procedure: LEFT AND RIGHT HEART CATHETERIZATION WITH CORONARY ANGIOGRAM;  Surgeon: Peter M Martinique, MD;  Location: [facility] CATH LAB;  Service: Cardiovascular;  Laterality: N/A;  . TEE WITHOUT CARDIOVERSION N/A 03/31/2014   Procedure: TRANSESOPHAGEAL ECHOCARDIOGRAM (TEE);  Surgeon: Ivin Poot, MD;  Location: [place];  Service: Open Heart Surgery;  Laterality: N/A;  . TONSILLECTOMY    . TUBAL LIGATION      OB History    No data available       Home Medications    Prior to Admission medications   Medication Sig Start Date End Date Taking? Authorizing Provider  ALPRAZolam (NIRAVAM) 0.25 MG dissolvable tablet Take 0.25 mg by mouth daily as needed for anxiety.   Yes [provider]  aspirin 81 MG tablet Take 81 mg by mouth daily.   Yes [provider]  diltiazem (CARDIZEM SR) 90 MG 12 hr capsule Take 1 capsule (90 mg total) by mouth 2 (two) times daily. 09/13/16  Yes Martinique, Peter M, MD  hydroxypropyl methylcellulose / hypromellose (ISOPTO TEARS / GONIOVISC) 2.5 % ophthalmic solution Place 1 drop into both eyes daily as needed for dry eyes.   Yes [provider]  ibuprofen (ADVIL,MOTRIN) 200 MG tablet Take 400 mg  by mouth every 6 (six) hours as needed for headache or mild pain.   Yes [provider]  irbesartan (AVAPRO) 150 MG tablet Take 0.5 tablets (75 mg total) by mouth 2 (two) times daily. 05/27/16  Yes Martinique, Peter M, MD  Multiple Vitamin (MULTIVITAMIN WITH MINERALS) TABS tablet Take 1 tablet by mouth daily. Patient taking differently: Take 1 tablet by mouth every other day.  04/08/14  Yes Gold, Patrick Jupiter E, PA-C  amoxicillin (AMOXIL) 500 MG capsule Take 2 Gms ( 4 tablets ) 1 hour before dental work Patient not taking: Reported on 02/07/2017 11/23/15   Martinique, Peter M, MD    Family  History Family History  Problem Relation Age of Onset  . Cancer Mother   . Cancer Father   . Stroke Father   . Cancer Brother     Social History Social History   Tobacco Use  . Smoking status: Never Smoker  . Smokeless tobacco: Never Used  Substance Use Topics  . Alcohol use: Yes    Comment: " I like wine", - irregular consumption - socially has wine    . Drug use: No     Allergies   Patient has no known allergies.   Review of Systems Review of Systems  UNABLE TO OBTAIN ROS DUE TO LEVEL 5 CAVEAT   Physical Exam Updated Vital Signs BP (!) 170/82   Pulse 61   Temp 98.3 F (36.8 C)   Resp 16   Ht 5\' 2"  (1.575 m)   Wt 47.2 kg (104 lb)   SpO2 100%   BMI 19.02 kg/m  Vitals reviewed Physical Exam  Physical Examination: General appearance - alert, well appearing, and in no distress Mental status - alert, oriented to person, place, and time Eyes - pupils equal and reactive, extraocular eye movements intact Chest - clear to auscultation, no wheezes, rales or rhonchi, symmetric air entry Heart - normal rate, regular rhythm, normal S1, S2, no murmurs, rubs, clicks or gallops Abdomen - soft, nontender, nondistended, no masses or organomegaly Neurological - alert, orientedx 3, mild left sided facial droop, other cranial nerves intact, strength 5/5 in extremities x 4, sensation intact, speech mildly dysarthric, no aphasia Extremities - peripheral pulses normal, no pedal edema, no clubbing or cyanosis Skin - normal coloration and turgor, no rashes   ED Treatments / Results  Labs (all labs ordered are listed, but only abnormal results are displayed) Labs Reviewed  COMPREHENSIVE METABOLIC PANEL - Abnormal; Notable for the following components:      Result Value   Potassium 3.2 (*)    Glucose, Bld 142 (*)    Calcium 8.8 (*)    Total Protein 6.3 (*)    All other components within normal limits  URINALYSIS, ROUTINE W REFLEX MICROSCOPIC - Abnormal; Notable for the  following components:   Color, Urine STRAW (*)    All other components within normal limits  I-STAT CHEM 8, ED - Abnormal; Notable for the following components:   Potassium 3.2 (*)    Glucose, Bld 142 (*)    Calcium, Ion 1.13 (*)    All other components within normal limits  ETHANOL  PROTIME-INR  APTT  CBC  DIFFERENTIAL  RAPID URINE DRUG SCREEN, HOSP PERFORMED  MAGNESIUM  I-STAT TROPONIN, ED    EKG  EKG Interpretation  Date/Time:  Tuesday February 07 2017 13:00:27 EST Ventricular Rate:  60 PR Interval:    QRS Duration: 156 QT Interval:  481 QTC Calculation: 481 R Axis:  60 Text Interpretation:  Sinus or ectopic atrial rhythm Right bundle branch block ST depression, consider ischemia, lateral lds No significant change since last tracing Confirmed by Alfonzo Beers [phone]) on 02/07/2017 1:08:40 PM       Radiology Ct Head Code Stroke Wo Contrast  Result Date: 02/07/2017 CLINICAL DATA:  Code stroke. 76 year old female last seen normal at 2200 hours yesterday. Slurred speech and left facial droop. EXAM: CT HEAD WITHOUT CONTRAST TECHNIQUE: Contiguous axial images were obtained from the base of the skull through the vertex without intravenous contrast. COMPARISON:  None. FINDINGS: Brain: Patchy and confluent bilateral cerebral white matter hypodensity. Some deep white matter capsule involvement bilaterally, perhaps greater on the right. No superimposed midline shift, ventriculomegaly, mass effect, evidence of mass lesion, intracranial hemorrhage or evidence of cortically based acute infarction. No cortical encephalomalacia identified. Vascular: Mild Calcified atherosclerosis at the skull base. No suspicious intracranial vascular hyperdensity. Skull: Negative.  No acute osseous abnormality identified. Sinuses/Orbits: Clear. Other: Visualized orbits and scalp soft tissues are within normal limits. ASPECTS [facility] Stroke Program Early CT Score) - Ganglionic level infarction (caudate,  lentiform nuclei, internal capsule, insula, M1-M3 cortex): 7 - Supraganglionic infarction (M4-M6 cortex): 3 Total score (0-10 with 10 being normal): 10 IMPRESSION: 1. Advanced bilateral cerebral white matter disease. No acute cortically based infarct or acute intracranial hemorrhage identified. 2. ASPECTS is 10. 3. The above was relayed via text pager to Dr. Jerelyn Charles on 02/07/2017 at 13:01 . Electronically Signed   By: Genevie Ann M.D.   On: 02/07/2017 13:01    Procedures Procedures (including critical care time)  Medications Ordered in ED Medications  potassium chloride SA (K-DUR,KLOR-CON) CR tablet 40 mEq (not administered)  aspirin chewable tablet 81 mg (not administered)  aspirin tablet 81 mg (not administered)  diltiazem (CARDIZEM SR) 12 hr capsule 90 mg (not administered)  irbesartan (AVAPRO) tablet 75 mg (not administered)  hydrALAZINE (APRESOLINE) injection 5-10 mg (not administered)  0.9 %  sodium chloride infusion (not administered)  senna-docusate (Senokot-S) tablet 1 tablet (not administered)  atorvastatin (LIPITOR) tablet 80 mg (not administered)  heparin injection 5,000 Units (not administered)  LORazepam (ATIVAN) injection 0.5 mg (not administered)   stroke: mapping our early stages of recovery book ( Does not apply Given 02/07/17 1430)     Initial Impression / Assessment and Plan / ED Course  I have reviewed the triage vital signs and the nursing notes.  Pertinent labs & imaging results that were available during my care of the patient were reviewed by me and considered in my medical decision making (see chart for details).     Patient presenting after code stroke initiated in triage.  Her head CT was reassuring.  She has a left-sided facial droop that is improving during the ED visit.  She was seen by neurology who canceled the code stroke due to her being out of the window for TPA.  Patient started on low-dose aspirin and discussed with medical service for admission and  further management.  Final Clinical Impressions(s) / ED Diagnoses   Final diagnoses:  Cerebrovascular accident (CVA), unspecified mechanism [facility])    ED Discharge Orders    None       Symphanie Cederberg, Forbes Cellar, MD 02/07/17 1559

## 2017-02-07 NOTE — ED Triage Notes (Signed)
Pt reports woke up today at 10 am with right sided facial droop, numbness to right arm, and unsteady on her feet. Woke up at 5 am this morning to use the bathroom and she was okay, no deficits. Now has right sided droop, slurred speech, left arm weakness.

## 2017-02-07 NOTE — Code Documentation (Signed)
76yo female arriving to [facility] via private vehicle at 1207.  Patient from home where she noted left facial droop at 0900 this morning.  LKW yesterday at 2200 prior to bed.  Patient presented to the ED where a code stroke was called.  Patient to CT.  Stroke team to the bedside.  NIHSS 2, see documentation for details and code stroke times.  Patient with left facial droop and dysarthria on exam.  CT completed.  No acute stroke treatment at this time.  Code stroke canceled d/t being outside the 4.5h window and no signs/symptoms of LVO.  Patient to E39.   Bedside handoff with ED RN Santiago Glad.

## 2017-02-07 NOTE — Progress Notes (Signed)
Patient came back from MRI.

## 2017-02-07 NOTE — Progress Notes (Signed)
Pt. arrived to the unit alert and Oriented x4  Left side facial droop  Denies pain  Oriented to equipment in the room. All questions and concerns addressed. bed in lowest position with alarm set.

## 2017-02-07 NOTE — H&P (Signed)
History and Physical    Yvonne Ballard HUD:[id number] DOB: 1940-09-15 DOA: 02/07/2017   PCP: Kelton Pillar, MD   Patient coming from:  Home    Chief Complaint: Left facial droop   HPI: Yvonne Ballard is a 76 y.o. female with medical history significant for HTN, history of CAD, history of ascending aortic aneurysm status post repair with dental procedure, hyperlipidemia, AVR not on anticoagulation, presenting to the ED with left facial droop.   In review, the patient reports waking up this morning about 5 AM, noting that she had facial droop, and her speech was slightly dysarthric.  However, she does not remember if these changes in her face were also present last night.  She did not notice any weakness or tingling or numbness.  She denies any headaches, vision changes, denies any dysphagia.  She denies any chest pain or palpitations.  She denies any shortness of breath or cough.  She denies any abdominal pain, dysuria or hematuria.    She denies any recent infections.  She denies any fever or chills.  She denies any recent long distance trips.  She denies any calf pain, or leg swelling.  She is not on hormonal products.  No tobacco, ETOH or recreational drugs , NO history of DM , She takes aspirin daily. At the ED, code stroke was called, but due to the fact that the patient was out of the window of TPA, this was canceled, she has a NIH stroke scale of 2.    ED Course:  BP (!) 199/84 (BP Location: Right Arm)   Pulse 63   Temp 98.3 F (36.8 C)   Resp 19   Ht 5\' 2"  (1.575 m)   Wt 47.2 kg (104 lb)   SpO2 98%   BMI 19.02 kg/m   CT of the head shows advanced bilateral cerebral white matter disease, no acute cortically based infarct or acute intracranial hemorrhage The patient is on heparin per pharmacy. She is being admitted for further workup, including MRI of the brain, MRA of the brain, and MRA of the neck. Troponin is negative.  EKG without any significant changes, sinus  rhythm. Potassium was 3.2, which was replenished at the ED with 40 mEq of K dur oral   Review of Systems:  As per HPI otherwise all other systems reviewed and are negative  Past Medical History:  Diagnosis Date  . Abnormal ECG   . Anxiety    pt. reports that she is nervous person  . Anxiety disorder   . Ascending aortic aneurysm [facility])    s/p repair with Bental procedure  . Coronary artery disease 03/2014   mild non obstructive CAD  . Dyslipidemia, goal LDL below 70 07/26/2014  . Family history of adverse reaction to anesthesia   . Heart murmur   . History of hiatal hernia   . Hypertension   . 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HCC)   . S/P AVR (aortic valve replacement)    tissue valve  . Weight loss     Past Surgical History:  Procedure Laterality Date  . BENTALL PROCEDURE N/A 03/31/2014   Procedure: BIO-BENTALL PROCEDURE using a 48mm Magna Ease Aortic Tissue Valve, a 26 mm Terumo Gelweave Valsalva Graft, a 14x10x39mm Terumo Gelweave Graft, and a 89mm straight Hemashield Platinum Graft.;  Surgeon: Ivin Poot, MD;  Location: [place];  Service: Open Heart Surgery;  Laterality: N/A;  . LEFT AND RIGHT HEART CATHETERIZATION WITH CORONARY ANGIOGRAM N/A 03/26/2014   Procedure: LEFT AND RIGHT  HEART CATHETERIZATION WITH CORONARY ANGIOGRAM;  Surgeon: Peter M Martinique, MD;  Location: [facility] C[facility] LAB;  Service: Cardiovascular;  Laterality: N/A;  . TEE WITHOUT CARDIOVERSION N/A 03/31/2014   Procedure: TRANSESOPHAGEAL ECHOCARDIOGRAM (TEE);  Surgeon: Ivin Poot, MD;  Location: [place];  Service: Open Heart Surgery;  Laterality: N/A;  . TONSILLECTOMY    . TUBAL LIGATION      Social History Social History   Socioeconomic History  . Marital status: Widowed    Spouse name: Not on file  . Number of children: 2  . Years of education: Not on file  . Highest education level: Not on file  Social Needs  . Financial resource strain: Not on file  . Food insecurity - worry: Not on file  . Food  insecurity - inability: Not on file  . Transportation needs - medical: Not on file  . Transportation needs - non-medical: Not on file  Occupational History  . Not on file  Tobacco Use  . Smoking status: Never Smoker  . Smokeless tobacco: Never Used  Substance and Sexual Activity  . Alcohol use: Yes    Comment: " I like wine", - irregular consumption - socially has wine    . Drug use: No  . Sexual activity: Not on file  Other Topics Concern  . Not on file  Social History Narrative  . Not on file     No Known Allergies  Family History  Problem Relation Age of Onset  . Cancer Mother   . Cancer Father   . Cancer Brother       Prior to Admission medications   Medication Sig Start Date End Date Taking? Authorizing Provider  amoxicillin (AMOXIL) 500 MG capsule Take 2 Gms ( 4 tablets ) 1 hour before dental work 11/23/15   Martinique, Peter M, MD  aspirin 81 MG tablet Take 81 mg by mouth daily.    [provider]  diltiazem (CARDIZEM SR) 90 MG 12 hr capsule Take 1 capsule (90 mg total) by mouth 2 (two) times daily. 09/13/16   Martinique, Peter M, MD  irbesartan (AVAPRO) 150 MG tablet Take 0.5 tablets (75 mg total) by mouth 2 (two) times daily. 05/27/16   Martinique, Peter M, MD  Multiple Vitamin (MULTIVITAMIN WITH MINERALS) TABS tablet Take 1 tablet by mouth daily. Patient taking differently: Take 1 tablet by mouth every other day.  04/08/14   [name], PA-C    Physical Exam:  Vitals:   02/07/17 1220 02/07/17 1258 02/07/17 1419  BP: 103/79 (!) 199/84   Pulse: 73 63   Resp: 16 19   Temp: 98.3 F (36.8 C)  98.3 F (36.8 C)  TempSrc: Oral    SpO2: 100% 98%   Weight: 47.2 kg (104 lb)    Height: 5\' 2"  (1.575 m)     Constitutional: NAD, anxious, constantly moving  Eyes: PERRL, lids and conjunctivae normal ENMT: Mucous membranes are moist, without exudate or lesions  Neck: normal, supple, no masses, no thyromegaly Respiratory: clear to auscultation bilaterally, no wheezing, no  crackles. Normal respiratory effort  Cardiovascular: Regular rate and rhythm,  murmur, rubs or gallops. No extremity edema. 2+ pedal pulses. No carotid bruits.  Abdomen: Soft, non tender, No hepatosplenomegaly. Bowel sounds positive.  Musculoskeletal: no clubbing / cyanosis. Moves all extremities Skin: no jaundice, No lesions.  Neurologic: Sensation intact  Strength equal in all extremities. L facial droop is resolving  Psychiatric:   Alert and oriented x 3. Anxious  Labs on Admission: I have personally reviewed following labs and imaging studies  CBC: Recent Labs  Lab 02/07/17 1221 02/07/17 1235  WBC 6.0  --   NEUTROABS 4.1  --   HGB 12.2 12.2  HCT 36.8 36.0  MCV 92.5  --   PLT 185  --     Basic Metabolic Panel: Recent Labs  Lab 02/07/17 1221 02/07/17 1235  NA 137 139  K 3.2* 3.2*  CL 103 102  CO2 25  --   GLUCOSE 142* 142*  BUN 11 13  CREATININE 0.65 0.60  CALCIUM 8.8*  --     GFR: Estimated Creatinine Clearance: 43.2 mL/min (by C-G formula based on SCr of 0.6 mg/dL).  Liver Function Tests: Recent Labs  Lab 02/07/17 1221  AST 22  ALT 14  ALKPHOS 99  BILITOT 0.3  PROT 6.3*  ALBUMIN 3.8   No results for input(s): LIPASE, AMYLASE in the last 168 hours. No results for input(s): AMMONIA in the last 168 hours.  Coagulation Profile: Recent Labs  Lab 02/07/17 1221  INR 0.96    Cardiac Enzymes: No results for input(s): CKTOTAL, CKMB, CKMBINDEX, TROPONINI in the last 168 hours.  BNP (last 3 results) No results for input(s): PROBNP in the last 8760 hours.  HbA1C: No results for input(s): HGBA1C in the last 72 hours.  CBG: No results for input(s): GLUCAP in the last 168 hours.  Lipid Profile: No results for input(s): CHOL, HDL, LDLCALC, TRIG, CHOLHDL, LDLDIRECT in the last 72 hours.  Thyroid Function Tests: No results for input(s): TSH, T4TOTAL, FREET4, T3FREE, THYROIDAB in the last 72 hours.  Anemia Panel: No results for input(s):  VITAMINB12, FOLATE, FERRITIN, TIBC, IRON, RETICCTPCT in the last 72 hours.  Urine analysis:    Component Value Date/Time   COLORURINE STRAW (A) 02/07/2017 1340   APPEARANCEUR CLEAR 02/07/2017 1340   LABSPEC 1.006 02/07/2017 1340   PHURINE 7.0 02/07/2017 1340   GLUCOSEU NEGATIVE 02/07/2017 1340   HGBUR NEGATIVE 02/07/2017 1340   BILIRUBINUR NEGATIVE 02/07/2017 1340   KETONESUR NEGATIVE 02/07/2017 1340   PROTEINUR NEGATIVE 02/07/2017 1340   UROBILINOGEN 0.2 04/19/2014 0610   NITRITE NEGATIVE 02/07/2017 1340   LEUKOCYTESUR NEGATIVE 02/07/2017 1340    Sepsis Labs: @LABRCNTIP (procalcitonin:4,lacticidven:4) )No results found for this or any previous visit (from the past 240 hour(s)).   Radiological Exams on Admission: Ct Head Code Stroke Wo Contrast  Result Date: 02/07/2017 CLINICAL DATA:  Code stroke. 76 year old female last seen normal at 2200 hours yesterday. Slurred speech and left facial droop. EXAM: CT HEAD WITHOUT CONTRAST TECHNIQUE: Contiguous axial images were obtained from the base of the skull through the vertex without intravenous contrast. COMPARISON:  None. FINDINGS: Brain: Patchy and confluent bilateral cerebral white matter hypodensity. Some deep white matter capsule involvement bilaterally, perhaps greater on the right. No superimposed midline shift, ventriculomegaly, mass effect, evidence of mass lesion, intracranial hemorrhage or evidence of cortically based acute infarction. No cortical encephalomalacia identified. Vascular: Mild Calcified atherosclerosis at the skull base. No suspicious intracranial vascular hyperdensity. Skull: Negative.  No acute osseous abnormality identified. Sinuses/Orbits: Clear. Other: Visualized orbits and scalp soft tissues are within normal limits. ASPECTS [facility] Stroke Program Early CT Score) - Ganglionic level infarction (caudate, lentiform nuclei, internal capsule, insula, M1-M3 cortex): 7 - Supraganglionic infarction (M4-M6 cortex): 3 Total  score (0-10 with 10 being normal): 10 IMPRESSION: 1. Advanced bilateral cerebral white matter disease. No acute cortically based infarct or acute intracranial hemorrhage identified. 2. ASPECTS is 10. 3. The  above was relayed via text pager to Dr. Jerelyn Charles on 02/07/2017 at 13:01 . Electronically Signed   By: Genevie Ann M.D.   On: 02/07/2017 13:01    EKG: Independently reviewed.  Assessment/Plan Active Problems:   Stroke (cerebrum) (HCC)   Benign essential HTN   LVH (left ventricular hypertrophy) due to hypertensive disease   Aortic aneurysm, thoracic (HCC)   CAD (coronary artery disease), native coronary artery   Dyslipidemia, goal LDL below 70   Anxiety   Hiatal hernia   Left facial droop, stroke like symptoms,  Not a TPA candidate as she is out ofd the window. CT of the head shows advanced bilateral cerebral white matter disease, no acute cortically based infarct or acute intracranial hemorrhage Tn neg. EKG no ACS . No prior history of CVA . Neuro consulted, recommending admission for further workup  Risk factors for CVA include  HTN, CAD, hyperlipidemia    Admit to Tele obs  Stroke order set  MRI/MRA brain   MRA neck Allow permissive HTN Echo  SLP  lipid panel A1C Aspirin  Lipitor 80 mg daily  Appreciate Neuro follow up    Hypertension BP  199/84  Pulse 63  Continue home anti-hypertensive medications after she is out of permissive HTN  Add Hydralazine Q6 hours as needed for BP 210/110   Hyperlipidemia Start Lipitor.  Check lipid panel   Hypokalemia, likely dietary, Received  40 meq IVx1orally  Oral replenishment as needed  Check Mg Repeat CMET in am      DVT prophylaxis:  Heparin  Code Status:    Full  Family Communication:  Discussed with patient Disposition Plan: Expect patient to be discharged to home after condition improves Consults called:    Neuro per EDP  Admission status: Tele Obs    Sharene Butters, PA-C [facility]   02/07/2017, 2:30 PM

## 2017-02-07 NOTE — Consult Note (Signed)
Requesting Physician: ED physician assistant    Chief Complaint: Stroke  History obtained from:  Patient    HPI:                                                                                                                                         Yvonne Ballard is an 76 y.o. female who has a past medical history of ascending aortic aneurysm status post repair with Bentyl procedure, coronary artery disease, hyperlipidemia, hypertension, aortic valve replacement not on anticoagulation, who was at her baseline yesterday when she went to bed.   Apparently she woke this morning at approximately 5:00 and noted that she had a facial droop and that her speech was slightly dysarthric.   Did not notice any weakness tingling numbness.  No headache.  No visual symptoms. Code stroke was called while patient was in triage.  Due to the fact that the patient was out of the window for TPA and not LVO code stroke was canceled.  Patient had a NIH stroke scale of 2 Denies any preceding fevers or chills.  Denies any preceding flulike symptoms. Date last known well: Date: 02/06/2017 Time last known well: Unable to determine tPA Given: No: Out of the window NIH stroke scale of 2 Modified Rankin: Rankin Score=0   Past Medical History:  Diagnosis Date  . Abnormal ECG   . Anxiety    pt. reports that she is nervous person  . Anxiety disorder   . Ascending aortic aneurysm [facility])    s/p repair with Bental procedure  . Coronary artery disease 03/2014   mild non obstructive CAD  . Dyslipidemia, goal LDL below 70 07/26/2014  . Family history of adverse reaction to anesthesia   . Heart murmur   . History of hiatal hernia   . Hypertension   . 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HCC)   . S/P AVR (aortic valve replacement)    tissue valve  . Weight loss     Past Surgical History:  Procedure Laterality Date  . BENTALL PROCEDURE N/A 03/31/2014   Procedure: BIO-BENTALL PROCEDURE using a 37mm Magna Ease Aortic Tissue  Valve, a 26 mm Terumo Gelweave Valsalva Graft, a 14x10x56mm Terumo Gelweave Graft, and a 47mm straight Hemashield Platinum Graft.;  Surgeon: Ivin Poot, MD;  Location: [place];  Service: Open Heart Surgery;  Laterality: N/A;  . LEFT AND RIGHT HEART CATHETERIZATION WITH CORONARY ANGIOGRAM N/A 03/26/2014   Procedure: LEFT AND RIGHT HEART CATHETERIZATION WITH CORONARY ANGIOGRAM;  Surgeon: Peter M Martinique, MD;  Location: [facility] CATH LAB;  Service: Cardiovascular;  Laterality: N/A;  . TEE WITHOUT CARDIOVERSION N/A 03/31/2014   Procedure: TRANSESOPHAGEAL ECHOCARDIOGRAM (TEE);  Surgeon: Ivin Poot, MD;  Location: [place];  Service: Open Heart Surgery;  Laterality: N/A;  . TONSILLECTOMY    . TUBAL LIGATION      Family History  Problem Relation Age of Onset  .  Cancer Mother   . Cancer Father   . Cancer Brother    Social History:  reports that  has never smoked. she has never used smokeless tobacco. She reports that she drinks alcohol. She reports that she does not use drugs.  Allergies: No Known Allergies  Medications:                                                                                                                          No current facility-administered medications for this encounter.    Current Outpatient Medications  Medication Sig Dispense Refill  . amoxicillin (AMOXIL) 500 MG capsule Take 2 Gms ( 4 tablets ) 1 hour before dental work 4 capsule 3  . aspirin 81 MG tablet Take 81 mg by mouth daily.    Marland Kitchen diltiazem (CARDIZEM SR) 90 MG 12 hr capsule Take 1 capsule (90 mg total) by mouth 2 (two) times daily. 180 capsule 1  . irbesartan (AVAPRO) 150 MG tablet Take 0.5 tablets (75 mg total) by mouth 2 (two) times daily. 180 tablet 1  . Multiple Vitamin (MULTIVITAMIN WITH MINERALS) TABS tablet Take 1 tablet by mouth daily. (Patient taking differently: Take 1 tablet by mouth every other day. )       ROS:                                                                                                                                        History obtained from the patient  General ROS: negative for - chills, fatigue, fever, night sweats, weight gain or weight loss Psychological ROS: negative for - behavioral disorder, hallucinations, memory difficulties, mood swings or suicidal ideation Ophthalmic ROS: negative for - blurry vision, double vision, eye pain or loss of vision ENT ROS: negative for - epistaxis, nasal discharge, oral lesions, sore throat, tinnitus or vertigo Allergy and Immunology ROS: negative for - hives or itchy/watery eyes Hematological and Lymphatic ROS: negative for - bleeding problems, bruising or swollen lymph nodes Endocrine ROS: negative for - galactorrhea, hair pattern changes, polydipsia/polyuria or temperature intolerance Respiratory ROS: negative for - cough, hemoptysis, shortness of breath or wheezing Cardiovascular ROS: negative for - chest pain, dyspnea on exertion, edema or irregular heartbeat Gastrointestinal ROS: negative for - abdominal pain, diarrhea, hematemesis, nausea/vomiting or stool incontinence Genito-Urinary ROS: negative for - dysuria, hematuria, incontinence or urinary frequency/urgency Musculoskeletal ROS: negative for -  joint swelling or muscular weakness Neurological ROS: as noted in HPI Dermatological ROS: negative for rash and skin lesion changes  Neurologic Examination:                                                                                                      Blood pressure 103/79, pulse 73, temperature 98.3 F (36.8 C), temperature source Oral, resp. rate 16, height 5\' 2"  (1.575 m), weight 47.2 kg (104 lb), SpO2 100 %.  HEENT-  Normocephalic, no lesions, without obvious abnormality.  Normal external eye and conjunctiva.  Normal TM's bilaterally.  Normal auditory canals and external ears. Normal external nose, mucus membranes and septum.  Normal pharynx. Cardiovascular- S1, S2 normal, pulses palpable throughout    Lungs- chest clear, no wheezing, rales, normal symmetric air entry Abdomen- normal findings: bowel sounds normal Extremities- no edema Lymph-no adenopathy palpable Musculoskeletal-no joint tenderness, deformity or swelling Skin-warm and dry, no hyperpigmentation, vitiligo, or suspicious lesions  Neurological Examination Mental Status: Alert, oriented, thought content appropriate.  Speech dysarthric without evidence of aphasia.  Able to follow 3 step commands without difficulty. Cranial Nerves: II: Visual fields grossly normal,  III,IV, VI: ptosis not present, extra-ocular motions intact bilaterally, pupils equal, round, reactive to light and accommodation V,VII: left lower facial droop, facial light touch sensation normal bilaterally VIII: hearing normal bilaterally IX,X: uvula rises symmetrically XI: bilateral shoulder shrug XII: midline tongue extension Motor: Right : Upper extremity   5/5    Left:     Upper extremity   5/5  Lower extremity   5/5     Lower extremity   5/5 Tone and bulk:normal tone throughout; no atrophy noted Sensory: Pinprick and light touch intact throughout, bilaterally Deep Tendon Reflexes: 2+ and symmetric throughout Plantars: Right: downgoing   Left: downgoing Cerebellar: normal finger-to-nose,and normal heel-to-shin test Gait: not tested  Lab Results: Basic Metabolic Panel: Recent Labs  Lab 02/07/17 1235  NA 139  K 3.2*  CL 102  GLUCOSE 142*  BUN 13  CREATININE 0.60   Liver Function Tests: No results for input(s): AST, ALT, ALKPHOS, BILITOT, PROT, ALBUMIN in the last 168 hours. No results for input(s): LIPASE, AMYLASE in the last 168 hours. No results for input(s): AMMONIA in the last 168 hours.  CBC: Recent Labs  Lab 02/07/17 1221 02/07/17 1235  WBC 6.0  --   NEUTROABS 4.1  --   HGB 12.2 12.2  HCT 36.8 36.0  MCV 92.5  --   PLT 185  --    Microbiology: Results for orders placed or performed during the hospital encounter of  04/19/14  Urine culture     Status: None   Collection Time: 04/19/14  6:10 AM  Result Value Ref Range Status   Specimen Description URINE, CLEAN CATCH  Final   Special Requests NONE  Final   Colony Count   Final    30,000 COLONIES/ML Performed at [facility]    Culture   Final    Multiple bacterial morphotypes present, none predominant. Suggest appropriate recollection if clinically indicated. Performed at [facility]    Report Status 04/21/2014 FINAL  Final    I have reviewed CTH: No acute changes.  Chronic white matter disease.   Assessment and plan discussed with with attending physician and they are in agreement.    Etta Quill PA-C [facility] [phone]  02/07/2017, 12:52 PM   Attending Neurohospitalist Addendum Patient seen and examined. Agree with the history and physical as documented above. Agree with the plan as documented, which I helped formulate. I have independently obtaining history, and review of systems on my exam and the patient and reviewed the chart including pertinent labs and pertinent imaging.     Assessment: 76 y.o. female presenting to the hospital with new onset dysarthria and visual droop.  Patient was well out of the window for TPA and did not meet the criteria for LVO.  Code stroke was canceled however patient likely has suffered a small vessel infarct.  Patient will be worked up for subacute/acute stroke.  Stroke Risk Factors - hyperlipidemia and hypertension  Recommend 1. HgbA1c, fasting lipid panel 2. MRI/MRA of the brain without contrast 3. PT consult, OT consult, Speech consult 4. Echocardiogram 5. 80 mg of Atorvistatin 6. Prophylactic therapy-Antiplatelet med: Aspirin 81 mg 7. Risk factor modification 8. Telemetry monitoring 9. Frequent neuro checks 10 NPO until passes stroke swallow screen 11 please page stroke NP  Or  PA  Or MD from 8am -4 pm  as this patient from this time will be  followed by  the stroke.   You can look them up on [email or web address]  Password [email or web address]   --- Amie Portland, MD [facility] [phone]  If 7pm to 7am, please call on call as listed on AMION.

## 2017-02-07 NOTE — Care Management Obs Status (Signed)
[facility] NOTIFICATION   Patient Details  Name: Yvonne Ballard MRN: [id number] Date of Birth: [birth year]   Medicare Observation Status Notification Given:  Yes    Carles Collet, RN 02/07/2017, 3:56 PM

## 2017-02-07 NOTE — ED Notes (Signed)
Pt reports L sided facial droop LSN last night before sleeping

## 2017-02-07 NOTE — ED Notes (Signed)
Spoke with Santiago Glad, [place] about activating code stroke, reports to activate. Will go to CT 1.

## 2017-02-08 ENCOUNTER — Observation Stay (HOSPITAL_BASED_OUTPATIENT_CLINIC_OR_DEPARTMENT_OTHER): Payer: Medicare Other

## 2017-02-08 DIAGNOSIS — I10 Essential (primary) hypertension: Secondary | ICD-10-CM | POA: Diagnosis not present

## 2017-02-08 DIAGNOSIS — I6300 Cerebral infarction due to thrombosis of unspecified precerebral artery: Secondary | ICD-10-CM | POA: Diagnosis not present

## 2017-02-08 DIAGNOSIS — I2510 Atherosclerotic heart disease of native coronary artery without angina pectoris: Secondary | ICD-10-CM | POA: Diagnosis not present

## 2017-02-08 DIAGNOSIS — E785 Hyperlipidemia, unspecified: Secondary | ICD-10-CM | POA: Diagnosis not present

## 2017-02-08 DIAGNOSIS — I361 Nonrheumatic tricuspid (valve) insufficiency: Secondary | ICD-10-CM | POA: Diagnosis not present

## 2017-02-08 DIAGNOSIS — I639 Cerebral infarction, unspecified: Secondary | ICD-10-CM | POA: Diagnosis not present

## 2017-02-08 LAB — LIPID PANEL
CHOL/HDL RATIO: 2.8 ratio
CHOLESTEROL: 189 mg/dL (ref 0–200)
HDL: 67 mg/dL (ref >40)
LDL CALC: 113 mg/dL — AB (ref 0–99)
TRIGLYCERIDES: 45 mg/dL (ref <150)
VLDL: 9 mg/dL (ref 0–40)

## 2017-02-08 LAB — HEMOGLOBIN A1C
Hgb A1c MFr Bld: 5.5 % (ref 4.8–5.6)
Mean Plasma Glucose: 111.15 mg/dL

## 2017-02-08 LAB — ECHOCARDIOGRAM COMPLETE
Height: 62 in
Weight: 1664.91 oz

## 2017-02-08 MED ORDER — ATORVASTATIN CALCIUM 80 MG PO TABS: 80.0000 mg | ORAL_TABLET | Freq: Every day | ORAL | 0 refills | Status: DC

## 2017-02-08 MED ORDER — IRBESARTAN 150 MG PO TABS: 75.0000 mg | ORAL_TABLET | Freq: Two times a day (BID) | ORAL | Status: DC

## 2017-02-08 NOTE — Evaluation (Signed)
Clinical/Bedside Swallow Evaluation Patient Details  Name: Yvonne Ballard MRN: [id number] Date of Birth: 12-11-1940  Today's Date: 02/08/2017 Time: SLP Start Time (ACUTE ONLY): 0810 SLP Stop Time (ACUTE ONLY): 0820 SLP Time Calculation (min) (ACUTE ONLY): 20 min  Past Medical History:  Past Medical History:  Diagnosis Date  . Abnormal ECG   . Anxiety    pt. reports that she is nervous person  . Anxiety disorder   . Ascending aortic aneurysm [facility])    s/p repair with Bental procedure  . Coronary artery disease 03/2014   mild non obstructive CAD  . Dyslipidemia, goal LDL below 70 07/26/2014  . Family history of adverse reaction to anesthesia   . Heart murmur   . History of hiatal hernia   . Hypertension   . 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HCC)   . S/P AVR (aortic valve replacement)    tissue valve  . Weight loss    Past Surgical History:  Past Surgical History:  Procedure Laterality Date  . BENTALL PROCEDURE N/A 03/31/2014   Procedure: BIO-BENTALL PROCEDURE using a 65mm Magna Ease Aortic Tissue Valve, a 26 mm Terumo Gelweave Valsalva Graft, a 14x10x17mm Terumo Gelweave Graft, and a 53mm straight Hemashield Platinum Graft.;  Surgeon: Ivin Poot, MD;  Location: [place];  Service: Open Heart Surgery;  Laterality: N/A;  . LEFT AND RIGHT HEART CATHETERIZATION WITH CORONARY ANGIOGRAM N/A 03/26/2014   Procedure: LEFT AND RIGHT HEART CATHETERIZATION WITH CORONARY ANGIOGRAM;  Surgeon: Peter M Martinique, MD;  Location[facility] C[facility] LAB;  Service: Cardiovascular;  Laterality: N/A;  . TEE WITHOUT CARDIOVERSION N/A 03/31/2014   Procedure: TRANSESOPHAGEAL ECHOCARDIOGRAM (TEE);  Surgeon: Ivin Poot, MD;  Location: [place];  Service: Open Heart Surgery;  Laterality: N/A;  . TONSILLECTOMY    . TUBAL LIGATION     HPI:  Yvonne Lowreyis a 76 y.o.femalewith medical history significant for HTN,history of CAD, history of ascending aortic aneurysm status post repair with dental procedure,  hyperlipidemia,AVR not on anticoagulation, presenting to the ED with left facial droop.MRI  findings of acute infarction involving right posterior lentiform nucleus, mid corona radiata, and caudate body. No hemorrhage or mass effect.   Assessment / Plan / Recommendation Clinical Impression    Pt presents with intermittent delayed throat clearing following sips of thin liquids which appear to most consistently correlate with talking and drinking at the same time.   S/s of aspiration were mitigated with small controlled cup sips and cessation of talking.  Pt has trace left sided oral motor weakness which does not appear to impact her ability to contain, transit or clear boluses from the oral cavity as evidenced by no oral residuals post swallow and no spillage of boluses anteriorly.  As a result, recommend that pt remain on a regular diet with thin liquids.  No further ST needs indicated for swallowing.        Aspiration Risk  No limitations    Diet Recommendation Regular;Thin liquid   Liquid Administration via: Cup Medication Administration: Whole meds with liquid Supervision: Patient able to self feed Compensations: Minimize environmental distractions;Slow rate;Small sips/bites    Other  Recommendations Oral Care Recommendations: Oral care BID   Follow up Recommendations None      Frequency and Duration            Prognosis        Swallow Study   General Date of Onset: 02/07/17 HPI: Yvonne Lowreyis a 76 y.o.femalewith medical history significant for HTN,history of CAD, history of  ascending aortic aneurysm status post repair with dental procedure, hyperlipidemia,AVR not on anticoagulation, presenting to the ED with left facial droop.MRI  findings of acute infarction involving right posterior lentiform nucleus, mid corona radiata, and caudate body. No hemorrhage or mass effect. Type of Study: Bedside Swallow Evaluation Previous Swallow Assessment: none on record Diet  Prior to this Study: Regular;Thin liquids Temperature Spikes Noted: No Respiratory Status: Room air History of Recent Intubation: No Behavior/Cognition: Alert;Cooperative;Pleasant mood Oral Cavity Assessment: Within Functional Limits Oral Cavity - Dentition: Adequate natural dentition Vision: Functional for self-feeding Self-Feeding Abilities: Able to feed self Patient Positioning: Upright in bed Baseline Vocal Quality: Normal Volitional Cough: Strong Volitional Swallow: Able to elicit    Oral/Motor/Sensory Function Overall Oral Motor/Sensory Function: Mild impairment Facial ROM: Reduced left Facial Symmetry: Abnormal symmetry left Facial Sensation: Within Functional Limits Lingual ROM: Within Functional Limits Lingual Symmetry: Within Functional Limits Lingual Strength: Within Functional Limits   Ice Chips     Thin Liquid Thin Liquid: Impaired Presentation: Cup Pharyngeal  Phase Impairments: Throat Clearing - Delayed    Nectar Thick     Honey Thick     Puree     Solid   GO   Solid: Within functional limits    Functional Assessment Tool Used: bedside swallow evaluation completed Functional Limitations: Swallowing Swallow Current Status (C1660): 0 percent impaired, limited or restricted Swallow Goal Status (Y3016): 0 percent impaired, limited or restricted Swallow Discharge Status (W1093): 0 percent impaired, limited or restricted         Yvonne Ballard L 02/08/2017,8:59 AM

## 2017-02-08 NOTE — Evaluation (Signed)
Occupational Therapy Evaluation Patient Details Name: Yvonne Ballard MRN: [id number] DOB: 16-Jul-1940 Today's Date: 02/08/2017    History of Present Illness Yvonne Ballard is a 76 y.o. female with medical history significant for HTN, history of CAD, history of ascending aortic aneurysm status post repair with dental procedure, hyperlipidemia, AVR not on anticoagulation, presenting to the ED with left facial droop.  MRI  findings of acute infarction involving right posterior lentiform nucleus, mid corona radiata, and caudate body. No hemorrhage or mass effect.     Clinical Impression   Pt reports she was independent with ADL PTA. Currently pt overall supervision for ADL and functional mobility. Pt presenting with poor safety awareness, following multi step commands intermittently, and with decreased awareness of her deficits. Pt planning to d/c home with intermittent supervision from family. Recommending HHOT for follow up to maximize independence and safety with ADL and functional mobility upon return home. All further OT needs can be met at the next venue of care; will defer OT to [facility]. Please re-consult if needs change. Thank you for this referral.    Follow Up Recommendations  Home health OT    Equipment Recommendations  None recommended by OT    Recommendations for Other Services       Precautions / Restrictions Precautions Precautions: Fall Restrictions Weight Bearing Restrictions: No      Mobility Bed Mobility Overal bed mobility: Modified Independent Bed Mobility: Supine to Sit     Supine to sit: Modified independent (Device/Increase time)     General bed mobility comments: no assist required  Transfers Overall transfer level: Needs assistance Equipment used: None Transfers: Sit to/from Stand Sit to Stand: Supervision         General transfer comment: for safety    Balance Overall balance assessment: Needs assistance Sitting-balance support: Feet supported;No  upper extremity supported Sitting balance-Leahy Scale: Good     Standing balance support: No upper extremity supported;During functional activity Standing balance-Leahy Scale: Good                         ADL either performed or assessed with clinical judgement   ADL Overall ADL's : Needs assistance/impaired Eating/Feeding: Set up;Sitting   Grooming: Supervision/safety;Standing;Wash/dry hands   Upper Body Bathing: Set up;Supervision/ safety;Sitting   Lower Body Bathing: Supervison/ safety;Sit to/from stand   Upper Body Dressing : Supervision/safety;Sitting   Lower Body Dressing: Supervision/safety;Sit to/from stand   Toilet Transfer: Supervision/safety;Ambulation;Regular Toilet   Toileting- Water quality scientist and Hygiene: Supervision/safety;Sit to/from stand   Tub/ Shower Transfer: Supervision/safety;Tub transfer;Ambulation Tub/Shower Transfer Details (indicate cue type and reason): Recommend use of shower chair for safety with bathing initially upon return home; pt verbalized understanding Functional mobility during ADLs: Supervision/safety       Vision Baseline Vision/History: Wears glasses Wears Glasses: Reading only Patient Visual Report: No change from baseline Vision Assessment?: No apparent visual deficits     Perception     Praxis      Pertinent Vitals/Pain Pain Assessment: No/denies pain     Hand Dominance Right   Extremity/Trunk Assessment Upper Extremity Assessment Upper Extremity Assessment: Overall WFL for tasks assessed   Lower Extremity Assessment Lower Extremity Assessment: Defer to PT evaluation   Cervical / Trunk Assessment Cervical / Trunk Assessment: Kyphotic   Communication Communication Communication: No difficulties   Cognition Arousal/Alertness: Awake/alert Behavior During Therapy: WFL for tasks assessed/performed Overall Cognitive Status: No family/caregiver present to determine baseline cognitive functioning Area  of Impairment: Following commands;Safety/judgement;Problem solving  Following Commands: Follows one step commands consistently;Follows multi-step commands inconsistently Safety/Judgement: Decreased awareness of safety;Decreased awareness of deficits   Problem Solving: Requires verbal cues    General Comments      Exercises     Shoulder Instructions      Home Living Family/patient expects to be discharged to:: Private residence Living Arrangements: Alone Available Help at Discharge: Family;Available PRN/intermittently Type of Home: House Home Access: Stairs to enter [employer] of Steps: 4 Entrance Stairs-Rails: None Home Layout: Other (Comment)(split level home) Alternate Level Stairs-Number of Steps: 3   Bathroom Shower/Tub: Tub/shower unit;Curtain   Biochemist, clinical: Standard Bathroom Accessibility: Yes   Home Equipment: Environmental consultant - 2 wheels;Tub bench;Grab bars - tub/shower          Prior Functioning/Environment Level of Independence: Independent        Comments: community ambulator, independant with ADLs, iADLs and driver        OT Problem List:        OT Treatment/Interventions:      OT Goals(Current goals can be found in the care plan section) Acute Rehab OT Goals Patient Stated Goal: go home soon OT Goal Formulation: All assessment and education complete, DC therapy  OT Frequency:     Barriers to D/C:            Co-evaluation              AM-PAC PT "6 Clicks" Daily Activity     Outcome Measure Help from another person eating meals?: None Help from another person taking care of personal grooming?: A Little Help from another person toileting, which includes using toliet, bedpan, or urinal?: A Little Help from another person bathing (including washing, rinsing, drying)?: A Little Help from another person to put on and taking off regular upper body clothing?: A Little Help from another person to put on  and taking off regular lower body clothing?: A Little 6 Click Score: 19   End of Session    Activity Tolerance: Patient tolerated treatment well Patient left: in chair;with call bell/phone within reach  OT Visit Diagnosis: Other abnormalities of gait and mobility (R26.89);Other symptoms and signs involving cognitive function                Time: 1139-1155 OT Time Calculation (min): 16 min Charges:  OT General Charges $OT Visit: 1 Visit OT Evaluation $OT Eval Moderate Complexity: 1 Mod G-Codes: OT G-codes **NOT FOR INPATIENT CLASS** Functional Assessment Tool Used: Clinical judgement Functional Limitation: Self care Self Care Current Status (M0867): At least 1 percent but less than 20 percent impaired, limited or restricted Self Care Goal Status (Y1950): At least 1 percent but less than 20 percent impaired, limited or restricted Self Care Discharge Status [phone]): At least 1 percent but less than 20 percent impaired, limited or restricted   Mel Almond A. Ulice Brilliant, M.S., OTR/L Pager: [place] 02/08/2017, 1:46 PM

## 2017-02-08 NOTE — Evaluation (Addendum)
Physical Therapy Evaluation Patient Details Name: Yvonne Ballard MRN: [id number] DOB: 22-Apr-1940 Today's Date: 02/08/2017   History of Present Illness  Sharyl Panchal is a 76 y.o. female with medical history significant for HTN, history of CAD, history of ascending aortic aneurysm status post repair with dental procedure, hyperlipidemia, AVR not on anticoagulation, presenting to the ED with left facial droop.  MRI  findings of acute infarction involving right posterior lentiform nucleus, mid corona radiata, and caudate body. No hemorrhage or mass effect.    Clinical Impression  Pt admitted with above diagnosis. Pt currently with functional limitations due to the deficits listed below (see PT Problem List). Pt is limited in safe mobility by decreased dynamic balance and safety awareness. PTA pt independent in ADLs, iADLs and community ambulator. Pt currently is supervision for bed mobility, min guard for transfers and ambulation of 300 feet without AD and minA for ascent/descent of 2 steps without railing. Pt would benefit from HHPT for balance training at d/c. PT will continue to follow acutely.     Follow Up Recommendations Home health PT    Equipment Recommendations  None recommended by PT       Precautions / Restrictions Precautions Precautions: Fall Restrictions Weight Bearing Restrictions: No      Mobility  Bed Mobility Overal bed mobility: Needs Assistance Bed Mobility: Supine to Sit     Supine to sit: Supervision     General bed mobility comments: supervision for safety  Transfers Overall transfer level: Needs assistance Equipment used: None Transfers: Sit to/from Stand Sit to Stand: Min guard         General transfer comment: min guard for safety, vc for scooting hips to EoB before powering up  Ambulation/Gait Ambulation/Gait assistance: Min guard;Supervision Ambulation Distance (Feet): 300 Feet Assistive device: None Gait Pattern/deviations: Step-through  pattern;Drifts right/left;Narrow base of support Gait velocity: increased, with mild instability Gait velocity interpretation: >2.62 ft/sec, indicative of independent community ambulator General Gait Details: hands on min guard progressing to supervision, increased cadence vc for slowing down and for increased base of support  Stairs Stairs: Yes Stairs assistance: Min guard;Supervision Stair Management: No rails;Two rails;Forwards Number of Stairs: 10 General stair comments: pt with safe ascent/descent with use of handrails, when attempted without handrail assist 1xLoB requiring minA for steadying, educated to use steps with rails at front of house for stabiliy or have daughter assist up 2 steps in back without rails   Wheelchair Mobility    Modified Rankin (Stroke Patients Only) Modified Rankin (Stroke Patients Only) Pre-Morbid Rankin Score: No symptoms Modified Rankin: No significant disability     Balance Overall balance assessment: Needs assistance Sitting-balance support: Feet supported Sitting balance-Leahy Scale: Good     Standing balance support: No upper extremity supported Standing balance-Leahy Scale: Good Standing balance comment: static balance good              High level balance activites: Head turns High Level Balance Comments: vc for slowing down to increse safety with dynamic activities             Pertinent Vitals/Pain Pain Assessment: No/denies pain    Home Living Family/patient expects to be discharged to:: Private residence Living Arrangements: Alone Available Help at Discharge: Family;Available PRN/intermittently Type of Home: House Home Access: Stairs to enter Entrance Stairs-Rails: None Entrance Stairs-Number of Steps: 4 Home Layout: One level;Multi-level Home Equipment: Walker - 2 wheels;Tub bench;Grab bars - tub/shower(slim walker for bathroom)      Prior Function Level of Independence: Independent  Comments: community  ambulator, independant with ADLs, iADLs and driver     Hand Dominance        Extremity/Trunk Assessment   Upper Extremity Assessment Upper Extremity Assessment: Defer to OT evaluation    Lower Extremity Assessment Lower Extremity Assessment: Overall WFL for tasks assessed    Cervical / Trunk Assessment Cervical / Trunk Assessment: Kyphotic  Communication   Communication: No difficulties  Cognition Arousal/Alertness: Awake/alert Behavior During Therapy: Anxious Overall Cognitive Status: No family/caregiver present to determine baseline cognitive functioning                                 General Comments: pt with lots of anxious energy, can't sit still, reports she moves alot at baseline however reduces her safety awarness with movement especially with IV in place      General Comments General comments (skin integrity, edema, etc.): prior to activity BP 151/108, SaO2 on RA 98%O2, HR 73bpm    Exercises     Assessment/Plan    PT Assessment Patient needs continued PT services  PT Problem List Decreased balance;Decreased safety awareness       PT Treatment Interventions DME instruction;Gait training;Stair training;Functional mobility training;Therapeutic activities;Therapeutic exercise;Balance training;Neuromuscular re-education;Cognitive remediation;Patient/family education    PT Goals (Current goals can be found in the Care Plan section)  Acute Rehab PT Goals Patient Stated Goal: go home soon PT Goal Formulation: With patient Time For Goal Achievement: 02/22/17 Potential to Achieve Goals: Good    Frequency Min 4X/week   Barriers to discharge Decreased caregiver support      Co-evaluation               AM-PAC PT "6 Clicks" Daily Activity  Outcome Measure Difficulty turning over in bed (including adjusting bedclothes, sheets and blankets)?: None Difficulty moving from lying on back to sitting on the side of the bed? : A Little Difficulty  sitting down on and standing up from a chair with arms (e.g., wheelchair, bedside commode, etc,.)?: A Little Help needed moving to and from a bed to chair (including a wheelchair)?: None Help needed walking in hospital room?: None Help needed climbing 3-5 steps with a railing? : A Little 6 Click Score: 21    End of Session Equipment Utilized During Treatment: Gait belt Activity Tolerance: Patient tolerated treatment well Patient left: in chair;with call bell/phone within reach;with chair alarm set Nurse Communication: Mobility status PT Visit Diagnosis: Unsteadiness on feet (R26.81);Other abnormalities of gait and mobility (R26.89);Other symptoms and signs involving the nervous system (R29.898)    Time: 1610-9604 PT Time Calculation (min) (ACUTE ONLY): 26 min   Charges:   PT Evaluation $PT Eval Moderate Complexity: 1 Mod PT Treatments $Gait Training: 23-37 mins   PT G Codes:   PT G-Codes **NOT FOR INPATIENT CLASS** Functional Assessment Tool Used: AM-PAC 6 Clicks Basic Mobility Functional Limitation: Mobility: Walking and moving around Mobility: Walking and Moving Around Current Status (V4098): At least 20 percent but less than 40 percent impaired, limited or restricted Mobility: Walking and Moving Around Goal Status [phone]): At least 1 percent but less than 20 percent impaired, limited or restricted    Benjamine Mola B. Migdalia Dk PT, DPT Acute Rehabilitation  [phone] Pager [phone]    [place] 02/08/2017, 11:54 AM

## 2017-02-08 NOTE — Progress Notes (Signed)
STROKE TEAM PROGRESS NOTE  Admission History: Yvonne Ballard is an 76 y.o. female who has a past medical history of ascending aortic aneurysm status post repair with Bentyl procedure, coronary artery disease, hyperlipidemia, hypertension, aortic valve replacement not on anticoagulation, who was at her baseline yesterday when she went to bed.   Apparently she woke this morning at approximately 5:00 and noted that she had a facial droop and that her speech was slightly dysarthric.   Did not notice any weakness tingling numbness.  No headache.  No visual symptoms. Code stroke was called while patient was in triage.  Due to the fact that the patient was out of the window for TPA and not LVO code stroke was canceled.  Patient had a NIH stroke scale of 2 Denies any preceding fevers or chills.  Denies any preceding flulike symptoms. Date last known well: Date: 02/06/2017 Time last known well: Unable to determine tPA Given: No: Out of the window NIH stroke scale of 2 Modified Rankin: Rankin Score=0  SUBJECTIVE (INTERVAL HISTORY)  No family at the bedside. Patient is found laying in bed in NAD  Overall she feels her condition is completely resolved. Voices no new complaints. No new events reported overnight. Patient is anxious to be discharged home  OBJECTIVE Lab Results: CBC:  Recent Labs  Lab 02/07/17 1221 02/07/17 1235  WBC 6.0  --   HGB 12.2 12.2  HCT 36.8 36.0  MCV 92.5  --   PLT 185  --    BMP: Recent Labs  Lab 02/07/17 1221 02/07/17 1235 02/07/17 1646  NA 137 139  --   K 3.2* 3.2*  --   CL 103 102  --   CO2 25  --   --   GLUCOSE 142* 142*  --   BUN 11 13  --   CREATININE 0.65 0.60  --   CALCIUM 8.8*  --   --   MG  --   --  2.1   Liver Function Tests:  Recent Labs  Lab 02/07/17 1221  AST 22  ALT 14  ALKPHOS 99  BILITOT 0.3  PROT 6.3*  ALBUMIN 3.8   Coagulation Studies:  Recent Labs    02/07/17 1221  APTT 30  INR 0.96   Urinalysis:  Recent Labs  Lab  02/07/17 1340  [place] 1.006  PHURINE 7.0  GLUCOSEU NEGATIVE  HGBUR NEGATIVE  BILIRUBINUR NEGATIVE  KETONESUR NEGATIVE  PROTEINUR NEGATIVE  NITRITE NEGATIVE  LEUKOCYTESUR NEGATIVE   Urine Drug Screen:     Component Value Date/Time   LABOPIA NONE DETECTED 02/07/2017 1340   COCAINSCRNUR NONE DETECTED 02/07/2017 1340   LABBENZ NONE DETECTED 02/07/2017 1340   AMPHETMU NONE DETECTED 02/07/2017 1340   THCU NONE DETECTED 02/07/2017 1340   LABBARB NONE DETECTED 02/07/2017 1340    Alcohol Level:  Recent Labs  Lab 02/07/17 1221  ETH <10    PHYSICAL EXAM Temp:  [97.5 F (36.4 C)-98.8 F (37.1 C)] 97.5 F (36.4 C) (11/28 0953) Pulse Rate:  [61-80] 72 (11/28 0953) Resp:  [16-20] 17 (11/28 0953) BP: (119-199)/(65-93) 155/84 (11/28 0953) SpO2:  [94 %-100 %] 99 % (11/28 0953) Weight:  [47.2 kg (104 lb 0.9 oz)] 47.2 kg (104 lb 0.9 oz) (11/28 0400) General - Well nourished, well developed, in no apparent distress Respiratory - Lungs clear bilaterally. No wheezing. Cardiovascular - Regular rate and rhythm   Neurological Examination Mental Status: Alert, oriented, thought content appropriate.  Speech slightly  dysarthric without evidence of aphasia on exam today.  Able to follow 3 step commands without difficulty. Cranial Nerves: II: Visual fields grossly normal,  III,IV, VI: ptosis not present, extra-ocular motions intact bilaterally, pupils equal, round, reactive to light and accommodation V,VII: very slight left lower facial droop, facial light touch sensation normal bilaterally VIII: hearing normal bilaterally IX,X: uvula rises symmetrically XI: bilateral shoulder shrug XII: midline tongue extension Motor: Right :  Upper extremity   5/5                                      Left:     Upper extremity   5/5             Lower extremity   5/5                                                  Lower extremity   5/5 Mild diminished fine finger  movements on the left. Orbits right over left upper extremity. Tone and bulk:normal tone throughout; no atrophy noted Sensory: Pinprick and light touch intact throughout, bilaterally Deep Tendon Reflexes: 2+ and symmetric throughout Plantars: Right: downgoing                                Left: downgoing Cerebellar: Mild ataxia with  finger-to-nose Gait: not tested  IMAGING: I have personally reviewed the radiological images below and agree with the radiology interpretations. MRI/MRA Brain Wo Contrast Result Date: 02/07/2017 IMPRESSION: MRI head: 1. Acute infarction involving right posterior lentiform nucleus, mid corona radiata, and caudate body. No hemorrhage or mass effect. 2. Moderate chronic microvascular ischemic changes and moderate parenchymal volume loss of the brain. MRA head: Patent anterior and posterior circulations. No large vessel occlusion, aneurysm, or significant stenosis is identified. MRA neck: 1. Post reconstruction of ascending aorta. Small dissection flap within distal arch at graft aorta margin. Distal arch measures 3.8 cm. 2. Common carotid artery origins not included within the field of view. 3. Patent carotid and vertebral arteries. No hemodynamically significant stenosis by NASCET criteria, dissection, or aneurysm.   Ct Head Code Stroke Wo Contrast Result Date: 02/07/2017 IMPRESSION: 1. Advanced bilateral cerebral white matter disease. No acute cortically based infarct or acute intracranial hemorrhage identified. 2. ASPECTS is 10.   Recent Results (from the past 43800 hour(s))  ECHOCARDIOGRAM COMPLETE   Collection Time: 02/08/17 11:09 AM  Result Value   Weight 1,664.91   Height 62   BP 155/84   Narrative                              *[facility]*                         [address]                        [place], [place] [address]  [phone]  Study Conclusions  - Left  ventricle: The cavity size was normal. Wall thickness was   increased in a pattern of mild LVH. Systolic function was normal.   The estimated ejection fraction was in the range of 60% to 65%.   Left ventricular diastolic function parameters were normal. - Aortic valve: Aoritc sinuses appear somewhat dilated above AVR   Post Bental AV leaflets not well seen mild gradient Root not well   visualized no AR Valve area (VTI): 1.49 cm^2. Valve area (Vmax):   1.37 cm^2. Valve area (Vmean): 1.3 cm^2. - Mitral valve: Calcified annulus. Mildly thickened leaflets . - Atrial septum: No defect or patent foramen ovale was identified.  -      _____________________________________________________________________ ASSESSMENT: Ms. Delphina Schum is a 76 y.o. female with PMH of CAD with Aortic, bovine, value replacement presenting to the hospital with new onset dysarthria and left facial numbness.  Patient was well out of the window for TPA and did not meet the criteria for LVO.  Code stroke was canceled however patient likely has suffered a small vessel infarct. MRI shows  Acute infarction involving right posterior lentiform nucleus, mid corona radiata, and caudate body.  02/08/17: Neuro exam today stable and symptoms improving. Work up completed. Patient states she has not been compliant with daily ASA and does not want to take a statin. Stroke risk factors and prevention reviewed with patient at length. Patient has been cleared by therapies for discharge. Neurology follow up in 6 weeks.  STROKE: Right brain subcortical Suspected Etiology: Likely small vessel. Resultant Symptoms: Minimal Left sided weakness Stroke Risk Factors: hyperlipidemia, hypertension and Coronary artery disease, s/p AVR - bovine, Aortic Aneurysm, Other Stroke Risk Factors: Advanced age, Coronary artery disease  Outstanding Stroke Work-up Studies: Work-up completed  PLAN  02/08/2017: Continue Continue Aspirin/ Statin Ongoing  aggressive stroke risk factor management Patient counseled to be compliant with her antithrombotic medications and statin Neurology follow up in 6 weeks  FLUID/ELECTROLYTE DISORDERS: Hypo Potassium - Replacement per primary  HYPERTENSION: Stable, some elevated B/P's noted overnight Permissive hypertension (OK if <220/120) for 24-48 hours post stroke and then gradually normalized within 5-7 days. Long term BP goal normotensive. May slowly restart home B/P medications after 48 hours Home Meds: Cardizem, Avapro  HYPERLIPIDEMIA:    Component Value Date/Time   CHOL 189 02/08/2017 0343   TRIG 45 02/08/2017 0343   HDL 67 02/08/2017 0343   CHOLHDL 2.8 02/08/2017 0343   VLDL 9 02/08/2017 0343   LDLCALC 113 (H) 02/08/2017 0343  Home Meds:  NONE LDL  goal < 70 Started on  Lipitor to 80 mg daily Continue statin at discharge - Patient not likely to continue taking statin. She states she does not believe they are not a "proven therapy"  R/O DIABETES: Lab Results  Component Value Date   HGBA1C 5.5 02/08/2017  HgbA1c goal < 7.0  Other Active Problems: Active Problems:   Benign essential HTN   LVH (left ventricular hypertrophy) due to hypertensive disease   Aortic aneurysm, thoracic (HCC)   CAD (coronary artery disease), native coronary artery   Dyslipidemia, goal LDL below 70   Anxiety   Hiatal hernia   Stroke (cerebrum) [facility])  Hospital day # 0  VTE prophylaxis: SCD's  Diet : Diet regular Room service appropriate? Yes; Fluid consistency: Thin   Prior Home Stroke Medications: aspirin 81 mg daily and states only taking ASA 4x per week  Hospital Current Stroke Medications: aspirin 81 mg daily and  and Lipitor 80 mg Stroke New Meds Plan: Now on aspirin 81 mg daily and Lipitor 80 mg daily Discharge Stroke Meds:  Please discharge patient on aspirin 81 mg daily and and Lipitor 80 mg   Disposition: 01-Home or Self Care Therapy Recs:  HOME Follow Recs:  Kelton Pillar, MD- PCP in  2-3 weeks Follow up with [facility] in 6 weeks  FAMILY UPDATES: No family at bedside  TEAM UPDATES: Mariel Aloe, MD  Renie Ora Stroke Neurology Team 02/08/2017 12:32 PM I have personally examined this patient, reviewed notes, independently viewed imaging studies, participated in medical decision making and plan of care.ROS completed by me personally and pertinent positives fully documented  I have made any additions or clarifications directly to the above note. Agree with note above.  He presented with a right brain subcortical infarct from small vessel disease. Recommend continue aspirin and Lipitor and ongoing therapies. Follow-up as an outpatient in stroke clinic in 6 weeks. Greater than 50% time during this 25 minute visit was spent on counseling and coordination of care about his lacunar stroke. Discuss with Dr. Lonny Prude.  Antony Contras, MD Medical Director [facility] Stroke Center Pager: [phone] 02/08/2017 3:03 PM  Neurology to sign-off at this time. Please call with any further questions or concerns. Thank you for this consultation.  To contact Stroke Continuity provider, please refer to [email or web address] After hours, contact General Neurology

## 2017-02-08 NOTE — Care Management Note (Signed)
Case Management Note  Patient Details  Name: Josephyne Tarter MRN: [id number] Date of Birth: December 30, 1940  Subjective/Objective:                    Action/Plan: CM consulted for [facility] services. CM met with the patient and she is refusing [place] services. Will update MD.  Patients daughter to assist her at home as needed. Family to provide transportation home.   Expected Discharge Date:  02/08/17               Expected Discharge Plan:  [place]  In-House Referral:     Discharge planning Services  CM Consult  Post Acute Care Choice:  Home Health(pt refused) Choice offered to:  Patient  DME Arranged:    DME Agency:     HH Arranged:  Patient Refused [place] Agency:     Status of Service:  Completed, signed off  If discussed at [employer] of Stay Meetings, dates discussed:    Additional Comments:  Pollie Friar, RN 02/08/2017, 4:13 PM

## 2017-02-08 NOTE — Discharge Summary (Signed)
Physician Discharge Summary  Yvonne Ballard OVF:[id number] DOB: June 16, 1940 DOA: 02/07/2017  PCP: Kelton Pillar, MD  Admit date: 02/07/2017 Discharge date: 02/08/2017  Admitted From: Home Disposition: Home  Recommendations for Outpatient Follow-up:  1. Follow up with PCP in 1 week 2. Follow up with Neurology in 6 weeks 3. Please follow up on the following pending results: None  Home Health: Physical therapy Equipment/Devices: None  Discharge Condition: Stable CODE STATUS: Full code Diet recommendation: Heart healthy   Brief/Interim Summary:  Admission HPI written by Sharene Butters, PA-C and Waldemar Dickens, MD   Chief Complaint: Left facial droop   HPI: Yvonne Ballard is a 76 y.o. female with medical history significant for HTN, history of CAD, history of ascending aortic aneurysm status post repair with dental procedure, hyperlipidemia, AVR not on anticoagulation, presenting to the ED with left facial droop.   In review, the patient reports waking up this morning about 5 AM, noting that she had facial droop, and her speech was slightly dysarthric.  However, she does not remember if these changes in her face were also present last night.  She did not notice any weakness or tingling or numbness.  She denies any headaches, vision changes, denies any dysphagia.  She denies any chest pain or palpitations.  She denies any shortness of breath or cough.  She denies any abdominal pain, dysuria or hematuria.    She denies any recent infections.  She denies any fever or chills.  She denies any recent long distance trips.  She denies any calf pain, or leg swelling.  She is not on hormonal products.  No tobacco, ETOH or recreational drugs , NO history of DM , She takes aspirin daily. At the ED, code stroke was called, but due to the fact that the patient was out of the window of TPA, this was canceled, she has a NIH stroke scale of 2.    ED Course:  BP (!) 199/84 (BP Location: Right Arm)    Pulse 63   Temp 98.3 F (36.8 C)   Resp 19   Ht 5\' 2"  (1.575 m)   Wt 47.2 kg (104 lb)   SpO2 98%   BMI 19.02 kg/m   CT of the head shows advanced bilateral cerebral white matter disease, no acute cortically based infarct or acute intracranial hemorrhage The patient is on heparin per pharmacy. She is being admitted for further workup, including MRI of the brain, MRA of the brain, and MRA of the neck. Troponin is negative.  EKG without any significant changes, sinus rhythm. Potassium was 3.2, which was replenished at the ED with 40 mEq of K dur oral     Hospital course:  Stroke MRI significant for acute infarction involving the right posterior lentiform nucleus, mid corona radiata, and caudate body. She had evidence of moderate chronic microvascular changes. Hemoglobin A1C of 5.5% with an LDL of 113. Patient started on Lipitor and continued Aspirin.  Essential hypertension Permissive hypertension. Continue Irbesartan 4 days after discharge.  Hyperlipidemia LDL of 113. Started on Lipitor  Hypokalemia Supplementation given.  Discharge Diagnoses:  Active Problems:   Benign essential HTN   LVH (left ventricular hypertrophy) due to hypertensive disease   Aortic aneurysm, thoracic (HCC)   CAD (coronary artery disease), native coronary artery   Dyslipidemia, goal LDL below 70   Anxiety   Hiatal hernia   Stroke (cerebrum) [facility])    Discharge Instructions  Discharge Instructions    Ambulatory referral to Neurology   Complete  by:  As directed    An appointment is requested in approximately:6 weeks Follow up with stroke clinic (Dr Leonie Man preferred, if not available, then consider Caesar Chestnut, [facility] or Jaynee Eagles whoever is available) at [facility] in about 6-8 weeks. Thanks.   Call MD for:  persistant dizziness or light-headedness   Complete by:  As directed    Call MD for:  persistant nausea and vomiting   Complete by:  As directed    Call MD for:  severe uncontrolled pain   Complete  by:  As directed    Diet - low sodium heart healthy   Complete by:  As directed    Increase activity slowly   Complete by:  As directed      Allergies as of 02/08/2017   No Known Allergies     Medication List    STOP taking these medications   amoxicillin 500 MG capsule Commonly known as:  AMOXIL     TAKE these medications   ALPRAZolam 0.25 MG dissolvable tablet Commonly known as:  NIRAVAM Take 0.25 mg by mouth daily as needed for anxiety.   aspirin 81 MG tablet Take 81 mg by mouth daily.   atorvastatin 80 MG tablet Commonly known as:  LIPITOR Take 1 tablet (80 mg total) by mouth daily at 6 PM.   diltiazem 90 MG 12 hr capsule Commonly known as:  CARDIZEM SR Take 1 capsule (90 mg total) by mouth 2 (two) times daily.   hydroxypropyl methylcellulose / hypromellose 2.5 % ophthalmic solution Commonly known as:  ISOPTO TEARS / GONIOVISC Place 1 drop into both eyes daily as needed for dry eyes.   ibuprofen 200 MG tablet Commonly known as:  ADVIL,MOTRIN Take 400 mg by mouth every 6 (six) hours as needed for headache or mild pain.   irbesartan 150 MG tablet Commonly known as:  AVAPRO Take 0.5 tablets (75 mg total) by mouth 2 (two) times daily. Start taking on:  02/12/2017 What changed:  These instructions start on 02/12/2017. If you are unsure what to do until then, ask your doctor or other care provider.   multivitamin with minerals Tabs tablet Take 1 tablet by mouth daily. What changed:  when to take this      Follow-up Information    Garvin Fila, MD. Schedule an appointment as soon as possible for a visit in 6 week(s).   Specialties:  Neurology, Radiology Contact information: [address] [place] [address] [phone]        Kelton Pillar, MD. Schedule an appointment as soon as possible for a visit in 1 week(s).   Specialty:  Family Medicine Contact information: [address] [place] [address] [phone]           No Known Allergies  Consultations:  Neurology/Stroke team   Procedures/Studies: Mr Jodene Nam Neck W Wo Contrast  Result Date: 02/07/2017 CLINICAL DATA:  76 y/o  F; left facial droop. EXAM: MRI HEAD WITHOUT CONTRAST MRA HEAD WITHOUT CONTRAST MRA NECK WITHOUT AND WITH CONTRAST TECHNIQUE: Multiplanar, multiecho pulse sequences of the brain and surrounding structures were obtained without intravenous contrast. Angiographic images of the Circle of Willis were obtained using MRA technique without intravenous contrast. Angiographic images of the neck were obtained using MRA technique without and with intravenous contrast. Carotid stenosis measurements (when applicable) are obtained utilizing NASCET criteria, using the distal internal carotid diameter as the denominator. COMPARISON:  02/07/2017 CT of the head CONTRAST:  12 cc MultiHance FINDINGS:  MRI HEAD FINDINGS Brain: Reduced diffusion involving right lentiform nucleus, corona radiata, and caudate body measuring 1.0 x 0.8 x 2.4 cm (volume = 1 cm^3)(AP x ML x CC series 4, image 28 and series 9, image 16). No hemorrhage or mass effect. Patchy nonspecific foci of T2 FLAIR hyperintense signal abnormality in subcortical and periventricular white matter are compatible with moderate chronic microvascular ischemic changes for age. Moderate brain parenchymal volume loss. Two punctate foci of susceptibility hypointensity are present within right frontal subcortical white matter compatible hemosiderin deposition of microhemorrhage. No herniation, hydrocephalus, or extra-axial collection. Vascular: As below. Skull and upper cervical spine: Normal marrow signal. Sinuses/Orbits: Negative. Other: None. MRA HEAD FINDINGS Internal carotid arteries: Patent. Mild irregularity of carotid siphons with mild left paraclinoid stenosis. Anterior cerebral arteries:  Patent. Middle cerebral arteries: Patent. Anterior communicating artery: Patent. Posterior communicating arteries:  Patent right. No left identified, likely hypoplastic or absent. Posterior cerebral arteries:  Patent. Basilar artery:  Patent. Vertebral arteries:  Patent.  Right dominant. No evidence of high-grade stenosis, large vessel occlusion, or aneurysm unless noted above. MRA NECK FINDINGS Aortic arch: Post repair of ascending aortic aneurysm. Small dissection flap within the distal arch and graft aorta margin (series 1901, image 38). Distal arch measures up to 3.8 cm. Origins of common carotid artery is not included within the field of view. Right common carotid artery: Patent. Right internal carotid artery: Patent. Right vertebral artery: Patent. Left common carotid artery: Patent. Left Internal carotid artery: Patent. Left Vertebral artery: Patent. Mild beaded irregularity of upper V2 and right V3 segment may represent fibromuscular dysplasia. There is no evidence of hemodynamically significant stenosis by NASCET criteria, occlusion, or aneurysm unless noted above. IMPRESSION: MRI head: 1. Acute infarction involving right posterior lentiform nucleus, mid corona radiata, and caudate body. No hemorrhage or mass effect. 2. Moderate chronic microvascular ischemic changes and moderate parenchymal volume loss of the brain. MRA head: Patent anterior and posterior circulations. No large vessel occlusion, aneurysm, or significant stenosis is identified. MRA neck: 1. Post reconstruction of ascending aorta. Small dissection flap within distal arch at graft aorta margin. Distal arch measures 3.8 cm. 2. Common carotid artery origins not included within the field of view. 3. Patent carotid and vertebral arteries. No hemodynamically significant stenosis by NASCET criteria, dissection, or aneurysm. These results will be called to the ordering clinician or representative by the Radiologist Assistant, and communication documented in the PACS or zVision Dashboard. Electronically Signed   By: Kristine Garbe M.D.   On: 02/07/2017  20:06   Mr Brain Wo Contrast  Result Date: 02/07/2017 CLINICAL DATA:  76 y/o  F; left facial droop. EXAM: MRI HEAD WITHOUT CONTRAST MRA HEAD WITHOUT CONTRAST MRA NECK WITHOUT AND WITH CONTRAST TECHNIQUE: Multiplanar, multiecho pulse sequences of the brain and surrounding structures were obtained without intravenous contrast. Angiographic images of the Circle of Willis were obtained using MRA technique without intravenous contrast. Angiographic images of the neck were obtained using MRA technique without and with intravenous contrast. Carotid stenosis measurements (when applicable) are obtained utilizing NASCET criteria, using the distal internal carotid diameter as the denominator. COMPARISON:  02/07/2017 CT of the head CONTRAST:  12 cc MultiHance FINDINGS: MRI HEAD FINDINGS Brain: Reduced diffusion involving right lentiform nucleus, corona radiata, and caudate body measuring 1.0 x 0.8 x 2.4 cm (volume = 1 cm^3)(AP x ML x CC series 4, image 28 and series 9, image 16). No hemorrhage or mass effect. Patchy nonspecific foci of T2 FLAIR hyperintense signal abnormality  in subcortical and periventricular white matter are compatible with moderate chronic microvascular ischemic changes for age. Moderate brain parenchymal volume loss. Two punctate foci of susceptibility hypointensity are present within right frontal subcortical white matter compatible hemosiderin deposition of microhemorrhage. No herniation, hydrocephalus, or extra-axial collection. Vascular: As below. Skull and upper cervical spine: Normal marrow signal. Sinuses/Orbits: Negative. Other: None. MRA HEAD FINDINGS Internal carotid arteries: Patent. Mild irregularity of carotid siphons with mild left paraclinoid stenosis. Anterior cerebral arteries:  Patent. Middle cerebral arteries: Patent. Anterior communicating artery: Patent. Posterior communicating arteries: Patent right. No left identified, likely hypoplastic or absent. Posterior cerebral arteries:   Patent. Basilar artery:  Patent. Vertebral arteries:  Patent.  Right dominant. No evidence of high-grade stenosis, large vessel occlusion, or aneurysm unless noted above. MRA NECK FINDINGS Aortic arch: Post repair of ascending aortic aneurysm. Small dissection flap within the distal arch and graft aorta margin (series 1901, image 38). Distal arch measures up to 3.8 cm. Origins of common carotid artery is not included within the field of view. Right common carotid artery: Patent. Right internal carotid artery: Patent. Right vertebral artery: Patent. Left common carotid artery: Patent. Left Internal carotid artery: Patent. Left Vertebral artery: Patent. Mild beaded irregularity of upper V2 and right V3 segment may represent fibromuscular dysplasia. There is no evidence of hemodynamically significant stenosis by NASCET criteria, occlusion, or aneurysm unless noted above. IMPRESSION: MRI head: 1. Acute infarction involving right posterior lentiform nucleus, mid corona radiata, and caudate body. No hemorrhage or mass effect. 2. Moderate chronic microvascular ischemic changes and moderate parenchymal volume loss of the brain. MRA head: Patent anterior and posterior circulations. No large vessel occlusion, aneurysm, or significant stenosis is identified. MRA neck: 1. Post reconstruction of ascending aorta. Small dissection flap within distal arch at graft aorta margin. Distal arch measures 3.8 cm. 2. Common carotid artery origins not included within the field of view. 3. Patent carotid and vertebral arteries. No hemodynamically significant stenosis by NASCET criteria, dissection, or aneurysm. These results will be called to the ordering clinician or representative by the Radiologist Assistant, and communication documented in the PACS or zVision Dashboard. Electronically Signed   By: Kristine Garbe M.D.   On: 02/07/2017 20:06   Mr Jodene Nam Head Wo Contrast  Result Date: 02/07/2017 CLINICAL DATA:  76 y/o  F; left  facial droop. EXAM: MRI HEAD WITHOUT CONTRAST MRA HEAD WITHOUT CONTRAST MRA NECK WITHOUT AND WITH CONTRAST TECHNIQUE: Multiplanar, multiecho pulse sequences of the brain and surrounding structures were obtained without intravenous contrast. Angiographic images of the Circle of Willis were obtained using MRA technique without intravenous contrast. Angiographic images of the neck were obtained using MRA technique without and with intravenous contrast. Carotid stenosis measurements (when applicable) are obtained utilizing NASCET criteria, using the distal internal carotid diameter as the denominator. COMPARISON:  02/07/2017 CT of the head CONTRAST:  12 cc MultiHance FINDINGS: MRI HEAD FINDINGS Brain: Reduced diffusion involving right lentiform nucleus, corona radiata, and caudate body measuring 1.0 x 0.8 x 2.4 cm (volume = 1 cm^3)(AP x ML x CC series 4, image 28 and series 9, image 16). No hemorrhage or mass effect. Patchy nonspecific foci of T2 FLAIR hyperintense signal abnormality in subcortical and periventricular white matter are compatible with moderate chronic microvascular ischemic changes for age. Moderate brain parenchymal volume loss. Two punctate foci of susceptibility hypointensity are present within right frontal subcortical white matter compatible hemosiderin deposition of microhemorrhage. No herniation, hydrocephalus, or extra-axial collection. Vascular: As below. Skull and upper  cervical spine: Normal marrow signal. Sinuses/Orbits: Negative. Other: None. MRA HEAD FINDINGS Internal carotid arteries: Patent. Mild irregularity of carotid siphons with mild left paraclinoid stenosis. Anterior cerebral arteries:  Patent. Middle cerebral arteries: Patent. Anterior communicating artery: Patent. Posterior communicating arteries: Patent right. No left identified, likely hypoplastic or absent. Posterior cerebral arteries:  Patent. Basilar artery:  Patent. Vertebral arteries:  Patent.  Right dominant. No evidence  of high-grade stenosis, large vessel occlusion, or aneurysm unless noted above. MRA NECK FINDINGS Aortic arch: Post repair of ascending aortic aneurysm. Small dissection flap within the distal arch and graft aorta margin (series 1901, image 38). Distal arch measures up to 3.8 cm. Origins of common carotid artery is not included within the field of view. Right common carotid artery: Patent. Right internal carotid artery: Patent. Right vertebral artery: Patent. Left common carotid artery: Patent. Left Internal carotid artery: Patent. Left Vertebral artery: Patent. Mild beaded irregularity of upper V2 and right V3 segment may represent fibromuscular dysplasia. There is no evidence of hemodynamically significant stenosis by NASCET criteria, occlusion, or aneurysm unless noted above. IMPRESSION: MRI head: 1. Acute infarction involving right posterior lentiform nucleus, mid corona radiata, and caudate body. No hemorrhage or mass effect. 2. Moderate chronic microvascular ischemic changes and moderate parenchymal volume loss of the brain. MRA head: Patent anterior and posterior circulations. No large vessel occlusion, aneurysm, or significant stenosis is identified. MRA neck: 1. Post reconstruction of ascending aorta. Small dissection flap within distal arch at graft aorta margin. Distal arch measures 3.8 cm. 2. Common carotid artery origins not included within the field of view. 3. Patent carotid and vertebral arteries. No hemodynamically significant stenosis by NASCET criteria, dissection, or aneurysm. These results will be called to the ordering clinician or representative by the Radiologist Assistant, and communication documented in the PACS or zVision Dashboard. Electronically Signed   By: Kristine Garbe M.D.   On: 02/07/2017 20:06   Ct Head Code Stroke Wo Contrast  Result Date: 02/07/2017 CLINICAL DATA:  Code stroke. 76 year old female last seen normal at 2200 hours yesterday. Slurred speech and left  facial droop. EXAM: CT HEAD WITHOUT CONTRAST TECHNIQUE: Contiguous axial images were obtained from the base of the skull through the vertex without intravenous contrast. COMPARISON:  None. FINDINGS: Brain: Patchy and confluent bilateral cerebral white matter hypodensity. Some deep white matter capsule involvement bilaterally, perhaps greater on the right. No superimposed midline shift, ventriculomegaly, mass effect, evidence of mass lesion, intracranial hemorrhage or evidence of cortically based acute infarction. No cortical encephalomalacia identified. Vascular: Mild Calcified atherosclerosis at the skull base. No suspicious intracranial vascular hyperdensity. Skull: Negative.  No acute osseous abnormality identified. Sinuses/Orbits: Clear. Other: Visualized orbits and scalp soft tissues are within normal limits. ASPECTS [facility] Stroke Program Early CT Score) - Ganglionic level infarction (caudate, lentiform nuclei, internal capsule, insula, M1-M3 cortex): 7 - Supraganglionic infarction (M4-M6 cortex): 3 Total score (0-10 with 10 being normal): 10 IMPRESSION: 1. Advanced bilateral cerebral white matter disease. No acute cortically based infarct or acute intracranial hemorrhage identified. 2. ASPECTS is 10. 3. The above was relayed via text pager to Dr. Jerelyn Charles on 02/07/2017 at 13:01 . Electronically Signed   By: Genevie Ann M.D.   On: 02/07/2017 13:01      Echocardiogram (02/08/2017)  Study Conclusions  - Left ventricle: The cavity size was normal. Wall thickness was   increased in a pattern of mild LVH. Systolic function was normal.   The estimated ejection fraction was in the range of  60% to 65%.   Left ventricular diastolic function parameters were normal. - Aortic valve: Aoritc sinuses appear somewhat dilated above AVR   Post Bental AV leaflets not well seen mild gradient Root not well   visualized no AR Valve area (VTI): 1.49 cm^2. Valve area (Vmax):   1.37 cm^2. Valve area (Vmean): 1.3 cm^2. -  Mitral valve: Calcified annulus. Mildly thickened leaflets . - Atrial septum: No defect or patent foramen ovale was identified.   Subjective: No concerns  Discharge Exam: Vitals:   02/08/17 0953 02/08/17 1334  BP: (!) 155/84 (!) 155/70  Pulse: 72 78  Resp: 17 18  Temp: (!) 97.5 F (36.4 C) 99.1 F (37.3 C)  SpO2: 99% 99%   Vitals:   02/08/17 0400 02/08/17 0600 02/08/17 0953 02/08/17 1334  BP: (!) 142/76 133/75 (!) 155/84 (!) 155/70  Pulse: 73 78 72 78  Resp:   17 18  Temp: 98.4 F (36.9 C)  (!) 97.5 F (36.4 C) 99.1 F (37.3 C)  TempSrc: Oral  Axillary Oral  SpO2: 96% 98% 99% 99%  Weight: 47.2 kg (104 lb 0.9 oz)     Height:        General: Pt is alert, awake, not in acute distress Cardiovascular: RRR, S1/S2 +, no rubs, no gallops Respiratory: CTA bilaterally, no wheezing, no rhonchi Abdominal: Soft, NT, ND, bowel sounds + Extremities: no edema, no cyanosis    The results of significant diagnostics from this hospitalization (including imaging, microbiology, ancillary and laboratory) are listed below for reference.     Microbiology: No results found for this or any previous visit (from the past 240 hour(s)).   Labs: BNP (last 3 results) No results for input(s): BNP in the last 8760 hours. Basic Metabolic Panel: Recent Labs  Lab 02/07/17 1221 02/07/17 1235 02/07/17 1646  NA 137 139  --   K 3.2* 3.2*  --   CL 103 102  --   CO2 25  --   --   GLUCOSE 142* 142*  --   BUN 11 13  --   CREATININE 0.65 0.60  --   CALCIUM 8.8*  --   --   MG  --   --  2.1   Liver Function Tests: Recent Labs  Lab 02/07/17 1221  AST 22  ALT 14  ALKPHOS 99  BILITOT 0.3  PROT 6.3*  ALBUMIN 3.8   No results for input(s): LIPASE, AMYLASE in the last 168 hours. No results for input(s): AMMONIA in the last 168 hours. CBC: Recent Labs  Lab 02/07/17 1221 02/07/17 1235  WBC 6.0  --   NEUTROABS 4.1  --   HGB 12.2 12.2  HCT 36.8 36.0  MCV 92.5  --   PLT 185  --     Cardiac Enzymes: No results for input(s): CKTOTAL, CKMB, CKMBINDEX, TROPONINI in the last 168 hours. BNP: Invalid input(s): POCBNP CBG: No results for input(s): GLUCAP in the last 168 hours. D-Dimer No results for input(s): DDIMER in the last 72 hours. Hgb A1c Recent Labs    02/08/17 0343  HGBA1C 5.5   Lipid Profile Recent Labs    02/08/17 0343  CHOL 189  HDL 67  LDLCALC 113*  TRIG 45  CHOLHDL 2.8   Thyroid function studies No results for input(s): TSH, T4TOTAL, T3FREE, THYROIDAB in the last 72 hours.  Invalid input(s): FREET3 Anemia work up No results for input(s): VITAMINB12, FOLATE, FERRITIN, TIBC, IRON, RETICCTPCT in the last 72 hours. Urinalysis  Component Value Date/Time   COLORURINE STRAW (A) 02/07/2017 1340   APPEARANCEUR CLEAR 02/07/2017 1340   LABSPEC 1.006 02/07/2017 1340   PHURINE 7.0 02/07/2017 1340   GLUCOSEU NEGATIVE 02/07/2017 1340   HGBUR NEGATIVE 02/07/2017 [place] 02/07/2017 1340   KETONESUR NEGATIVE 02/07/2017 1340   PROTEINUR NEGATIVE 02/07/2017 1340   UROBILINOGEN 0.2 04/19/2014 0610   NITRITE NEGATIVE 02/07/2017 1340   LEUKOCYTESUR NEGATIVE 02/07/2017 1340     SIGNED:   Cordelia Poche, MD [facility] 02/08/2017, 3:42 PM Pager [phone]  If 7PM-7AM, please contact night-coverage [email or web address] Password [email or web address]

## 2017-02-08 NOTE — Progress Notes (Signed)
  Echocardiogram 2D Echocardiogram has been performed.  Yvonne Ballard 02/08/2017, 11:09 AM

## 2017-02-08 NOTE — Discharge Instructions (Addendum)
[place],  You were in the hospital and found to have a stroke. Your symptoms have improved since being in the hospital. You will go home with home health physical and occupational therapy. Please follow-up with your primary care physician and neurologist.  Please do not take your Irbesartan for the next 4 days.

## 2017-02-08 NOTE — Progress Notes (Signed)
This nurse went over discharge with pt. And family. Pt. And family verbalized understanding of discharge.  All questions and concerns addressed.   Pt. Has all her belongings.   Discharged home and taken down in a wheelchair.

## 2017-02-08 NOTE — Evaluation (Addendum)
Speech Language Pathology Evaluation Patient Details Name: Yvonne Ballard MRN: [id number] DOB: Jul 14, 1940 Today's Date: 02/08/2017 Time: 9485-4627 SLP Time Calculation (min) (ACUTE ONLY): 20 min  Problem List:  Patient Active Problem List   Diagnosis Date Noted  . Anxiety 02/07/2017  . Hiatal hernia 02/07/2017  . Stroke (cerebrum) ([place]) 02/07/2017  . CAD (coronary artery disease), native coronary artery 07/26/2014  . Dyslipidemia, goal LDL below 70 07/26/2014  . S/P AVR (aortic valve replacement)   . Aortic aneurysm, thoracic ([place]) 03/31/2014  . Benign essential HTN 03/18/2014  . LVH (left ventricular hypertrophy) due to hypertensive disease 03/18/2014  . Heart murmur 03/18/2014   Past Medical History:  Past Medical History:  Diagnosis Date  . Abnormal ECG   . Anxiety    pt. reports that she is nervous person  . Anxiety disorder   . Ascending aortic aneurysm [facility])    s/p repair with Bental procedure  . Coronary artery disease 03/2014   mild non obstructive CAD  . Dyslipidemia, goal LDL below 70 07/26/2014  . Family history of adverse reaction to anesthesia   . Heart murmur   . History of hiatal hernia   . Hypertension   . 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HCC)   . S/P AVR (aortic valve replacement)    tissue valve  . Weight loss    Past Surgical History:  Past Surgical History:  Procedure Laterality Date  . BENTALL PROCEDURE N/A 03/31/2014   Procedure: BIO-BENTALL PROCEDURE using a 80mm Magna Ease Aortic Tissue Valve, a 26 mm Terumo Gelweave Valsalva Graft, a 14x10x96mm Terumo Gelweave Graft, and a 42mm straight Hemashield Platinum Graft.;  Surgeon: Ivin Poot, MD;  Location: [place];  Service: Open Heart Surgery;  Laterality: N/A;  . LEFT AND RIGHT HEART CATHETERIZATION WITH CORONARY ANGIOGRAM N/A 03/26/2014   Procedure: LEFT AND RIGHT HEART CATHETERIZATION WITH CORONARY ANGIOGRAM;  Surgeon: Peter M Martinique, MD;  Location: [facility] CATH LAB;  Service: Cardiovascular;   Laterality: N/A;  . TEE WITHOUT CARDIOVERSION N/A 03/31/2014   Procedure: TRANSESOPHAGEAL ECHOCARDIOGRAM (TEE);  Surgeon: Ivin Poot, MD;  Location: [place];  Service: Open Heart Surgery;  Laterality: N/A;  . TONSILLECTOMY    . TUBAL LIGATION     HPI:  Yvonne Lowreyis a 76 y.o.femalewith medical history significant for HTN,history of CAD, history of ascending aortic aneurysm status post repair with dental procedure, hyperlipidemia,AVR not on anticoagulation, presenting to the ED with left facial droop.MRI  findings of acute infarction involving right posterior lentiform nucleus, mid corona radiata, and caudate body. No hemorrhage or mass effect.   Assessment / Plan / Recommendation Clinical Impression   Pt presents with grossly intact cognitive-linguistic function.  Pt has trace left sided oral motor weakness which does not impact intelligibility.  Pt's speech is fluent and free from word finding impairment.  Pt also demonstrated [facility] memory, problem solving, attention, and awareness on various formal and informal evaluation measures.   Pt does appear anxious and was restless throughout evaluation which at times led her to move somewhat impulsively but therapist suspects this to be baseline given pt's report.  As a result, no further ST needs are indicated at this time.         Follow Up Recommendations  None          SLP Evaluation Cognition  Overall Cognitive Status: Within Functional Limits for tasks assessed Orientation Level: Oriented X4       Comprehension  Auditory Comprehension Overall Auditory Comprehension: Appears within functional limits  for tasks assessed    Expression Expression Primary Mode of Expression: Verbal Verbal Expression Overall Verbal Expression: Appears within functional limits for tasks assessed   Oral / Motor  Oral Motor/Sensory Function Overall Oral Motor/Sensory Function: Mild impairment Facial ROM: Reduced left Facial Symmetry: Abnormal  symmetry left Facial Sensation: Within Functional Limits Lingual ROM: Within Functional Limits Lingual Symmetry: Within Functional Limits Lingual Strength: Within Functional Limits Motor Speech Overall Motor Speech: Appears within functional limits for tasks assessed   GO          Functional Assessment Tool Used: cognitive linguistic evaluation completed Functional Limitations: Spoken language expressive Swallow Current Status (G6770): 0 percent impaired, limited or restricted Swallow Goal Status (H4035): 0 percent impaired, limited or restricted Swallow Discharge Status [phone]): 0 percent impaired, limited or restricted         Emilio Math 02/08/2017, 9:02 AM

## 2017-02-23 ENCOUNTER — Other Ambulatory Visit: Payer: Self-pay

## 2017-02-23 NOTE — Patient Outreach (Signed)
[facility]) Care Management  02/23/2017  Yvonne Ballard 05-Sep-1940 [id number]  EMMI: stroke Referral date: 02/23/17 Referral source: EMMI stroke red alert Referral reason: Went to follow up appointment: YES Day # 13  Telephone call to patient regarding EMMI stroke program. Unable to reach patient. HIPAA compliant voice message left with call back phone number.   PLAN: RNCM will attempt 2nd telephone call to patient within 3 business days.   Quinn Plowman RN,BSN,CCM [facility] Telephonic  [phone]   .

## 2017-02-24 ENCOUNTER — Other Ambulatory Visit: Payer: Self-pay

## 2017-02-24 NOTE — Patient Outreach (Signed)
[facility]) Care Management  02/24/2017  Yvonne Ballard 11/09/1940 [id number]   EMMI: stroke Referral date: 02/23/17 Referral source: EMMI stroke red alert Referral reason: Went to follow up appointment: YES Day # 13  Telephone call to patient regarding EMMI stroke red alert. HIPAA verified with patient Discussed EMMI stroke red alert program with patient. Patient states, " I do not like talking to those automated calls."  RNCM explained to patient reason for the automated calls.  Also advised patient that per the EMMI stroke program she had completed her last and final automated call.  Patient reports she had a follow up with her primary MD on 02/22/17. Patient states she also has a follow up scheduled with her cardiologist.  Patient states she is not sure about follow up with the neurologist because she does not want to take a statin medication and the neurologist is adamant that she does. Patient states she has worked very diligently with her primary MD to bring down her cholesterol level and based on her research she does not want to take the statin medication.  Patient states she is taking her medications as prescribed and is able to afford them at this time.  Patient reports she has transportation to her doctor appointments.  Patient states she lives alone but her daughter comes to her home on the weekends to assist her.  Patient states she is aware of how to reach her doctor after hours if needed.  RNCM reviewed signs /symptoms of stroke with patient. Advised to call 911 for stroke like symptoms.  RNCM advised patient to continue to follow up with her primary MD and discuss her concerns regarding the statin medication with her doctor.  RNCM advised patient to notify MD of any changes in condition prior to scheduled appointment. RNCM verified patient aware of 911 services for urgent/ emergent needs. Patient denies any further nursing needs at this time.     PLAN:  Per patients chart Admit date: 02/07/2017 Discharge date: 02/08/2017 Discharge Diagnoses:  Active Problems:   Benign essential HTN   LVH (left ventricular hypertrophy) due to hypertensive disease   Aortic aneurysm, thoracic (HCC)   CAD (coronary artery disease), native coronary artery   Dyslipidemia, goal LDL below 70   Anxiety   Hiatal hernia   Stroke (cerebrum) ([place] 76 y.o.femalewith medical history significant for HTN,history of CAD, history of ascending aortic aneurysm status post repair with dental procedure, hyperlipidemia,AVR not on anticoagulation,  PLAN:  RNCM will refer patient to care management assistant to close patient due to patient being assessed and having no further needs.

## 2017-03-03 ENCOUNTER — Telehealth: Payer: Self-pay | Admitting: Cardiology

## 2017-03-03 NOTE — Telephone Encounter (Signed)
Please call,cocnerning her Diltiazem.

## 2017-03-03 NOTE — Telephone Encounter (Signed)
Returned call to patient she stated cardizem sr 90 mg on back order.Stated she wanted to make sure ok to take her old cardizem sr 60 mg three times a day.Advised that will be ok until she receives cardizem sr 90 mg.

## 2017-03-08 DIAGNOSIS — F419 Anxiety disorder, unspecified: Secondary | ICD-10-CM | POA: Insufficient documentation

## 2017-03-08 DIAGNOSIS — Z8489 Family history of other specified conditions: Secondary | ICD-10-CM | POA: Insufficient documentation

## 2017-03-08 DIAGNOSIS — R634 Abnormal weight loss: Secondary | ICD-10-CM | POA: Insufficient documentation

## 2017-03-08 DIAGNOSIS — I10 Essential (primary) hypertension: Secondary | ICD-10-CM | POA: Insufficient documentation

## 2017-03-08 DIAGNOSIS — I7121 Aneurysm of the ascending aorta, without rupture: Secondary | ICD-10-CM | POA: Insufficient documentation

## 2017-03-08 DIAGNOSIS — M353 Polymyalgia rheumatica: Secondary | ICD-10-CM | POA: Insufficient documentation

## 2017-03-08 DIAGNOSIS — Z8719 Personal history of other diseases of the digestive system: Secondary | ICD-10-CM | POA: Insufficient documentation

## 2017-03-08 DIAGNOSIS — I712 Thoracic aortic aneurysm, without rupture: Secondary | ICD-10-CM | POA: Insufficient documentation

## 2017-03-08 DIAGNOSIS — R9431 Abnormal electrocardiogram [ECG] [EKG]: Secondary | ICD-10-CM | POA: Insufficient documentation

## 2017-03-11 IMAGING — CR DG CHEST 2V
2 series · 2 of 2 positions shown · non-contrast
Comparison: PA and lateral chest of May 14, 2014

CLINICAL DATA: Status post aortic valve replacement in March 2014, no chest complaints today

EXAM:
CHEST  2 VIEW

[w chest pa]
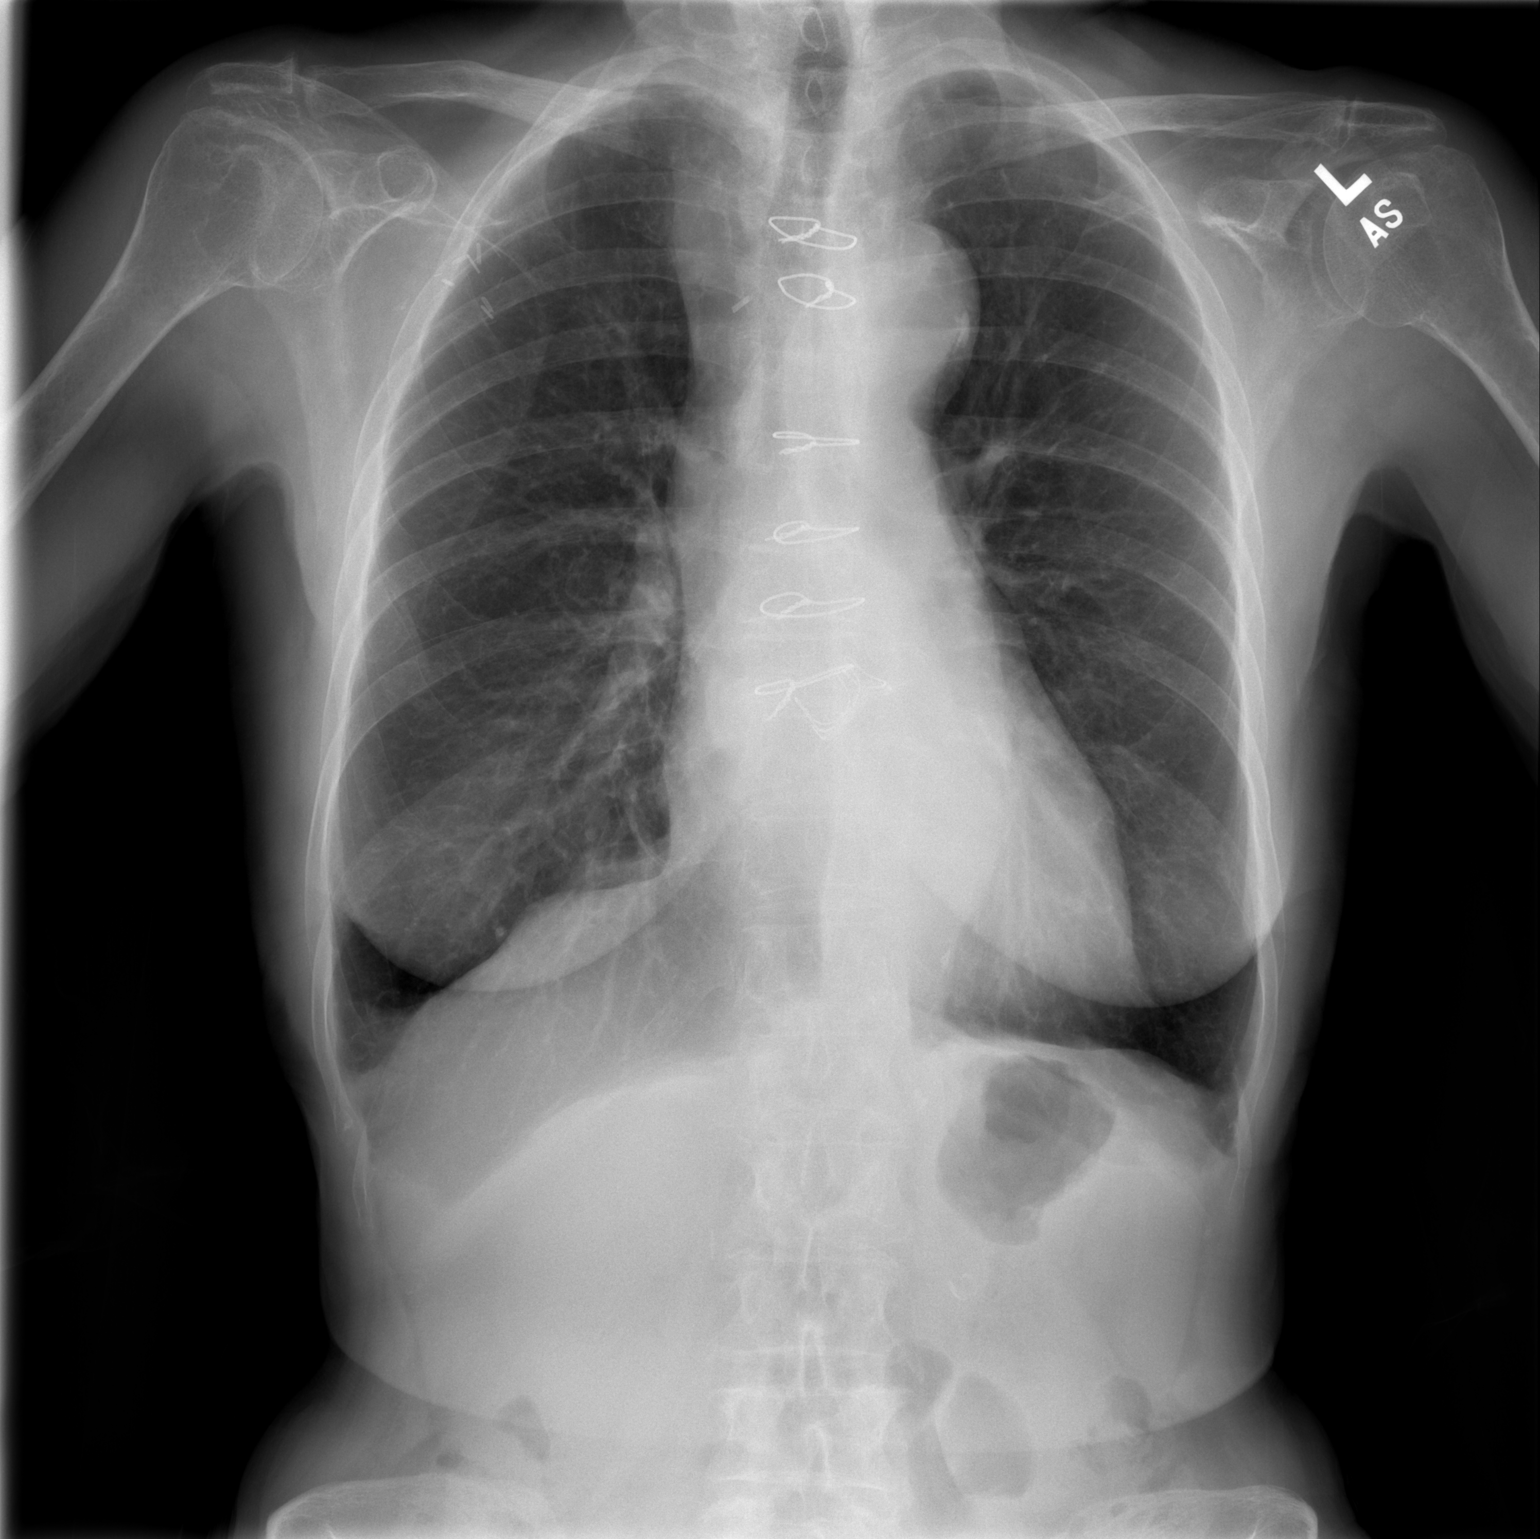

[w chest lat]
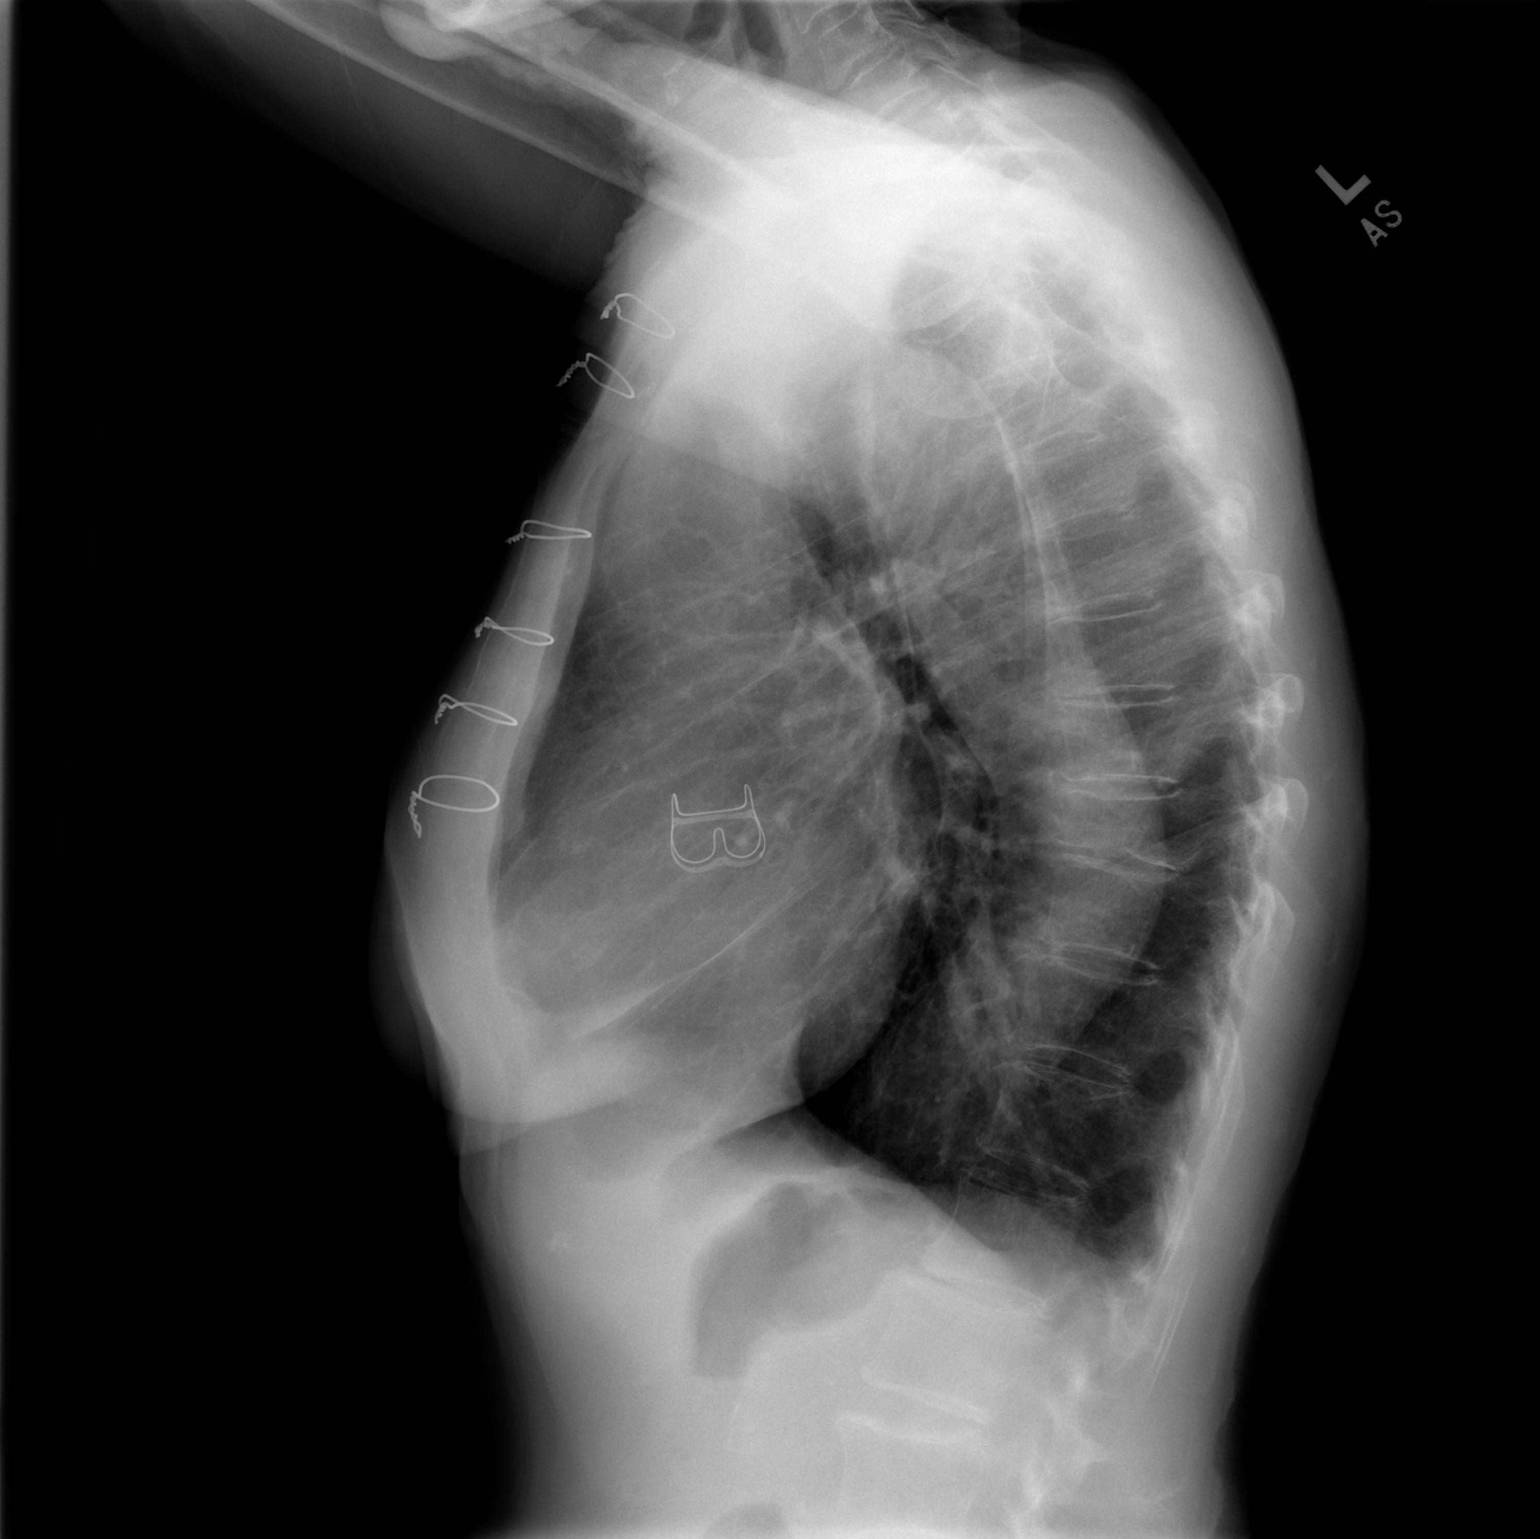

[2 of 2 positions shown; findings below may reference images not displayed]

FINDINGS: The lungs are hyperinflated. There has been interval near total
clearing of a small right pleural effusion. The heart is normal in
size. The prosthetic aortic valve ring is visible.There is mild
stable prominence of the aortic arch. The pulmonary vascularity is
normal. There are 6 intact sternal wires. There is stable surgical
clips in the upper right axillary region.
IMPRESSION: COPD. Interval near total clearing of the small right pleural
effusion.

## 2017-03-13 ENCOUNTER — Other Ambulatory Visit: Payer: Self-pay

## 2017-03-13 DIAGNOSIS — I10 Essential (primary) hypertension: Secondary | ICD-10-CM

## 2017-03-13 MED ORDER — DILTIAZEM HCL ER 90 MG PO CP12: 90.0000 mg | ORAL_CAPSULE | Freq: Two times a day (BID) | ORAL | 1 refills | Status: DC

## 2017-03-20 ENCOUNTER — Telehealth: Payer: Self-pay | Admitting: Cardiology

## 2017-03-20 MED ORDER — DILTIAZEM HCL 60 MG PO TABS: 60.0000 mg | ORAL_TABLET | Freq: Three times a day (TID) | ORAL | 1 refills | Status: DC

## 2017-03-20 NOTE — Telephone Encounter (Signed)
Called [facility] pharmacy - diltiazem 90mg  12 hour capsule is on backorder - does not come in tablet form. Closest doses are 30 and 60mg  but not sustained-released (SR).   Will route to MD to review and advise on possible med change

## 2017-03-20 NOTE — Telephone Encounter (Signed)
New Message   Pt c/o medication issue:  1. Name of Medication: diltiazem  2. How are you currently taking this medication (dosage and times per day)? 90mg   3. Are you having a reaction (difficulty breathing--STAT)? no  4. What is your medication issue? Per pt states the medication is on back order at the pharmacy. Pt would like to discuss what to do further about her medication

## 2017-03-20 NOTE — Telephone Encounter (Signed)
We can go to the short acting 60 mg tid until the original order available.  Bita Cartwright Martinique MD, [facility]

## 2017-03-20 NOTE — Telephone Encounter (Signed)
Returned call to patient Dr.Jordan's recommendations given.

## 2017-03-23 NOTE — Progress Notes (Signed)
Cardiology Office Note   Date:  03/27/2017   ID:  Yvonne Ballard, DOB [birth year], MRN [id number]  PCP:  Kelton Pillar, MD  Cardiologist:  Dr. Martinique   History of Present Illness: Yvonne Ballard is a 77 y.o. female with a hx of HTN, HLD, thoracic aortic aneurysm s/p Bental procedure (03/2014), and nonobst CAD who presents for follow up   She was initially seen in 03/2014. A 2D echo was done which showed a large thoracic aneurysm and the patient underwent heart catheterization showing mild nonobstructive CAD with mild LV dysfunction EF 45% with thoracic aneurysm and mildly elevated PA pressures. She underwent ascending and arch TAA repair with Bio-Bental procedure with tissue AVR and aorto-brachiocephalic bypass and aorto-left common carotid bypass on 03/31/2014.   She was seen in the ED in December 2017 with a right radial fracture following a fall. Managed by Ortho without surgery.  She was seen in the ED in July 2018 after a fall with pelvic fracture. This was treated conservatively.  She was admitted in November 2018 with left facial droop and dysarthria. CT acutely was negative. MRI significant for acute infarction involving the right posterior lentiform nucleus, mid corona radiata, and caudate body. She had evidence of moderate chronic microvascular changes. She was treated with lipitor and ASA.   On follow up today she is doing well from a cardiac standpoint. She denies any SOB, chest pain, palpitations, edema. She continues to complain of being a "nervous wreck".  She is fidgety.  She was switched from diltiazem SR 90 mg bid to plain diltiazem 60 mg tid since the SR formulation is on national back order. BP has been stable. She typically doesn't feel well in the am but does better in the afternoon. She complains her feet and hands stay cold.    Past Medical History:  Diagnosis Date  . Abnormal ECG   . Anxiety    pt. reports that she is nervous person  . Anxiety disorder   .  Ascending aortic aneurysm [facility])    s/p repair with Bental procedure  . Coronary artery disease 03/2014   mild non obstructive CAD  . Dyslipidemia, goal LDL below 70 07/26/2014  . Family history of adverse reaction to anesthesia   . Heart murmur   . History of hiatal hernia   . Hypertension   . 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HCC)   . S/P AVR (aortic valve replacement)    tissue valve  . Weight loss     Past Surgical History:  Procedure Laterality Date  . BENTALL PROCEDURE N/A 03/31/2014   Procedure: BIO-BENTALL PROCEDURE using a 39mm Magna Ease Aortic Tissue Valve, a 26 mm Terumo Gelweave Valsalva Graft, a 14x10x42mm Terumo Gelweave Graft, and a 23mm straight Hemashield Platinum Graft.;  Surgeon: Ivin Poot, MD;  Location: [place];  Service: Open Heart Surgery;  Laterality: N/A;  . LEFT AND RIGHT HEART CATHETERIZATION WITH CORONARY ANGIOGRAM N/A 03/26/2014   Procedure: LEFT AND RIGHT HEART CATHETERIZATION WITH CORONARY ANGIOGRAM;  Surgeon: Peter M Martinique, MD;  Location: [facility] CATH LAB;  Service: Cardiovascular;  Laterality: N/A;  . TEE WITHOUT CARDIOVERSION N/A 03/31/2014   Procedure: TRANSESOPHAGEAL ECHOCARDIOGRAM (TEE);  Surgeon: Ivin Poot, MD;  Location: [place];  Service: Open Heart Surgery;  Laterality: N/A;  . TONSILLECTOMY    . TUBAL LIGATION       Current Outpatient Medications  Medication Sig Dispense Refill  . ALPRAZolam (NIRAVAM) 0.25 MG dissolvable tablet Take 0.25 mg by mouth  daily as needed for anxiety.    Marland Kitchen diltiazem (CARDIZEM) 60 MG tablet Take 1 tablet (60 mg total) by mouth 3 (three) times daily. 90 tablet 1  . hydroxypropyl methylcellulose / hypromellose (ISOPTO TEARS / GONIOVISC) 2.5 % ophthalmic solution Place 1 drop into both eyes as needed for dry eyes.     Marland Kitchen ibuprofen (ADVIL,MOTRIN) 200 MG tablet Take 400 mg by mouth as needed for headache or mild pain.     Marland Kitchen irbesartan (AVAPRO) 150 MG tablet Take 0.5 tablets (75 mg total) by mouth 2 (two) times daily.    .  Multiple Vitamin (MULTIVITAMIN WITH MINERALS) TABS tablet Take 1 tablet by mouth daily. (Patient taking differently: Take 1 tablet by mouth every other day. 2-3 times a week)    . aspirin 81 MG tablet Take 81 mg by mouth daily.    Marland Kitchen atorvastatin (LIPITOR) 80 MG tablet Take 1 tablet (80 mg total) by mouth daily at 6 PM. 30 tablet 0  . diltiazem (CARDIZEM SR) 90 MG 12 hr capsule Take 1 capsule (90 mg total) by mouth 2 (two) times daily. 180 capsule 1   No current facility-administered medications for this visit.     Allergies:   Patient has no known allergies.    Social History:  The patient  reports that  has never smoked. she has never used smokeless tobacco. She reports that she drinks alcohol. She reports that she does not use drugs.   Family History:  The patient's family history includes Cancer in her brother, father, and mother; Stroke in her father.    ROS:  Please see the history of present illness.   Otherwise, review of systems are positive for none.   All other systems are reviewed and negative.    PHYSICAL EXAM: VS:  BP (!) 144/62   Pulse 68   Ht 5\' 1"  (1.549 m)   Wt 105 lb 12.8 oz (48 kg)   BMI 19.99 kg/m  , BMI Body mass index is 19.99 kg/m. GENERAL:  Well appearing, somewhat agitated WF HEENT:  PERRL, EOMI, sclera are clear. Oropharynx is clear. NECK:  No jugular venous distention, carotid upstroke brisk and symmetric, no bruits, no thyromegaly or adenopathy LUNGS:  Clear to auscultation bilaterally CHEST:  Unremarkable HEART:  RRR,  PMI not displaced or sustained,S1 and S2 within normal limits, no S3, no S4: no clicks, no rubs, 2/6 SEM ABD:  Soft, nontender. BS +, no masses or bruits. No hepatomegaly, no splenomegaly EXT:  2 + pulses throughout, no edema, no cyanosis no clubbing SKIN:  Warm and dry.  No rashes NEURO:  Alert and oriented x 3. Cranial nerves II through XII intact. PSYCH:  Cognitively intact  EKG:  EKG is not ordered today.   Recent  Labs: 02/07/2017: ALT 14; BUN 13; Creatinine, Ser 0.60; Hemoglobin 12.2; Magnesium 2.1; Platelets 185; Potassium 3.2; Sodium 139    Lipid Panel    Component Value Date/Time   CHOL 189 02/08/2017 0343   TRIG 45 02/08/2017 0343   HDL 67 02/08/2017 0343   CHOLHDL 2.8 02/08/2017 0343   VLDL 9 02/08/2017 0343   LDLCALC 113 (H) 02/08/2017 0343      Wt Readings from Last 3 Encounters:  03/27/17 105 lb 12.8 oz (48 kg)  02/08/17 104 lb 0.9 oz (47.2 kg)  09/15/16 104 lb (47.2 kg)     Ecg today shows NSR with RBBB. I have personally reviewed and interpreted this study.  Other studies Reviewed:  --  2D ECHO (05/08/14) EF 55-60%, no RWMA, G2DD, s/p biological Bentall procedure w/ total large replacement of a large arch aneurysm. Mean AV grad WNL for bioprosthesis. No AR. Mod MR thickening. L pleural effusion.    Echo 02/08/17: Study Conclusions  - Left ventricle: The cavity size was normal. Wall thickness was   increased in a pattern of mild LVH. Systolic function was normal.   The estimated ejection fraction was in the range of 60% to 65%.   Left ventricular diastolic function parameters were normal. - Aortic valve: Aoritc sinuses appear somewhat dilated above AVR   Post Bental AV leaflets not well seen mild gradient Root not well   visualized no AR Valve area (VTI): 1.49 cm^2. Valve area (Vmax):   1.37 cm^2. Valve area (Vmean): 1.3 cm^2. - Mitral valve: Calcified annulus. Mildly thickened leaflets . - Atrial septum: No defect or patent foramen ovale was identified  ASSESSMENT AND PLAN:  1. HTN -BP appears to be well controlled on current regimen. Continue diltiazem and Avapro at current doses.   2. 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 Continue ASA.  3. HLD- patient is not on statin. Refuses to take.   4. Mild LV dysfunction EF 45%- now resolved on last ECHO. EF now 60%.  5. Ascending aortic aneurysm s/p extensive Bental procedure with tissue AVR -- 2D ECHO ON 05/08/14 with  normal LVF with stable AVR and ascending aortic replacement  6.  CVA 11/18. Continue ASA. Instructed her to call 911 if she experiences stroke symptoms again.   Current medicines are reviewed at length with the patient today.  The patient does not have concerns regarding medicines.  The following changes have been made:  See above  Labs/ tests ordered today include: none     Disposition:   FU in 6 months.   Signed, Peter Martinique, MD  03/27/2017 4:16 PM    [place]

## 2017-03-27 ENCOUNTER — Ambulatory Visit (INDEPENDENT_AMBULATORY_CARE_PROVIDER_SITE_OTHER): Payer: 59 | Admitting: Cardiology

## 2017-03-27 ENCOUNTER — Encounter: Payer: Self-pay | Admitting: Cardiology

## 2017-03-27 VITALS — BP 144/62 | HR 68 | Ht 61.0 in | Wt 105.8 lb

## 2017-03-27 DIAGNOSIS — I10 Essential (primary) hypertension: Secondary | ICD-10-CM

## 2017-03-27 DIAGNOSIS — Z952 Presence of prosthetic heart valve: Secondary | ICD-10-CM

## 2017-03-27 DIAGNOSIS — I712 Thoracic aortic aneurysm, without rupture, unspecified: Secondary | ICD-10-CM

## 2017-03-27 NOTE — Patient Instructions (Signed)
Continue your current therapy  I will see you in 6  Months

## 2017-05-04 ENCOUNTER — Telehealth: Payer: Self-pay | Admitting: Cardiology

## 2017-05-04 MED ORDER — IRBESARTAN 150 MG PO TABS: 75.0000 mg | ORAL_TABLET | Freq: Two times a day (BID) | ORAL | 3 refills | Status: DC

## 2017-05-04 NOTE — Telephone Encounter (Signed)
New message   Patient states her mail order pharmacy is out of stock of medication until March . *STAT* If patient is at the pharmacy, call can be transferred to refill team.   1. Which medications need to be refilled? (please list name of each medication and dose if known) irbesartan (AVAPRO) 150 MG tablet  2. Which pharmacy/location (including street and city if local pharmacy) is medication to be sent to? [facility] [place] - [address] AT [place]  3. Do they need a 30 day or 90 day supply? [place]

## 2017-05-04 NOTE — Telephone Encounter (Signed)
Rx has been sent to the pharmacy electronically. ° °

## 2017-06-09 ENCOUNTER — Telehealth: Payer: Self-pay | Admitting: Cardiology

## 2017-06-09 MED ORDER — DILTIAZEM HCL 60 MG PO TABS: 60.0000 mg | ORAL_TABLET | Freq: Three times a day (TID) | ORAL | 1 refills | Status: DC

## 2017-06-09 NOTE — Telephone Encounter (Signed)
New  Message     *STAT* If patient is at the pharmacy, call can be transferred to refill team.   1. Which medications need to be refilled? (please list name of each medication and dose if known) diltiazem (CARDIZEM) 60 MG tablet  2. Which pharmacy/location (including street and city if local pharmacy) is medication to be sent to? [facility] [place], [place] - [address] AT [place]  3. Do they need a 30 day or 90 day supply? [place]

## 2017-08-14 NOTE — Progress Notes (Signed)
Cardiology Office Note   Date:  08/16/2017   ID:  Yvonne Ballard, DOB 02-Jul-1940, MRN [id number]  PCP:  Kelton Pillar, MD  Cardiologist:  Dr. Martinique   History of Present Illness: Yvonne Ballard is a 77 y.o. female with a hx of HTN, HLD, thoracic aortic aneurysm s/p Bental procedure (03/2014), and nonobst CAD who presents for follow up   She was initially seen in 03/2014. A 2D echo was done which showed a large thoracic aneurysm and the patient underwent heart catheterization showing mild nonobstructive CAD with mild LV dysfunction EF 45% with thoracic aneurysm and mildly elevated PA pressures. She underwent ascending and arch TAA repair with Bio-Bental procedure with tissue AVR and aorto-brachiocephalic bypass and aorto-left common carotid bypass on 03/31/2014.   She was seen in the ED in December 2017 with a right radial fracture following a fall. Managed by Ortho without surgery.  She was seen in the ED in July 2018 after a fall with pelvic fracture. This was treated conservatively.  She was admitted in November 2018 with left facial droop and dysarthria. CT acutely was negative. MRI significant for acute infarction involving the right posterior lentiform nucleus, mid corona radiata, and caudate body. She had evidence of moderate chronic microvascular changes. She was treated with lipitor and ASA.   On follow up today she is doing well from a cardiac standpoint. She denies any chest pain or SOB. She does complain of feeling lightheaded, shaky, and dry mouth. Hands and feet stay cold in the winter. Nerves are bad. She takes Xanax prn. BP is quite labile but she feels really bad if it drops too much.    Past Medical History:  Diagnosis Date  . Abnormal ECG   . Anxiety    pt. reports that she is nervous person  . Anxiety disorder   . Ascending aortic aneurysm [facility])    s/p repair with Bental procedure  . Coronary artery disease 03/2014   mild non obstructive CAD  . Dyslipidemia,  goal LDL below 70 07/26/2014  . Family history of adverse reaction to anesthesia   . Heart murmur   . History of hiatal hernia   . Hypertension   . 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HCC)   . S/P AVR (aortic valve replacement)    tissue valve  . Weight loss     Past Surgical History:  Procedure Laterality Date  . BENTALL PROCEDURE N/A 03/31/2014   Procedure: BIO-BENTALL PROCEDURE using a 16mm Magna Ease Aortic Tissue Valve, a 26 mm Terumo Gelweave Valsalva Graft, a 14x10x70mm Terumo Gelweave Graft, and a 77mm straight Hemashield Platinum Graft.;  Surgeon: Ivin Poot, MD;  Location: [place];  Service: Open Heart Surgery;  Laterality: N/A;  . LEFT AND RIGHT HEART CATHETERIZATION WITH CORONARY ANGIOGRAM N/A 03/26/2014   Procedure: LEFT AND RIGHT HEART CATHETERIZATION WITH CORONARY ANGIOGRAM;  Surgeon: Peter M Martinique, MD;  Location: [facility] CATH LAB;  Service: Cardiovascular;  Laterality: N/A;  . TEE WITHOUT CARDIOVERSION N/A 03/31/2014   Procedure: TRANSESOPHAGEAL ECHOCARDIOGRAM (TEE);  Surgeon: Ivin Poot, MD;  Location: [place];  Service: Open Heart Surgery;  Laterality: N/A;  . TONSILLECTOMY    . TUBAL LIGATION       Current Outpatient Medications  Medication Sig Dispense Refill  . ALPRAZolam (NIRAVAM) 0.25 MG dissolvable tablet Take 0.25 mg by mouth daily as needed for anxiety.    Marland Kitchen aspirin 81 MG tablet Take 81 mg by mouth daily.    Marland Kitchen diltiazem (CARDIZEM) 60  MG tablet Take 1 tablet (60 mg total) by mouth 3 (three) times daily. 90 tablet 1  . hydroxypropyl methylcellulose / hypromellose (ISOPTO TEARS / GONIOVISC) 2.5 % ophthalmic solution Place 1 drop into both eyes as needed for dry eyes.     Marland Kitchen ibuprofen (ADVIL,MOTRIN) 200 MG tablet Take 200 mg by mouth every 4 (four) hours as needed for headache or mild pain.     Marland Kitchen irbesartan (AVAPRO) 150 MG tablet Take 0.5 tablets (75 mg total) by mouth 2 (two) times daily. 90 tablet 3  . Multiple Vitamin (MULTIVITAMIN WITH MINERALS) TABS tablet Take 1  tablet by mouth daily. (Patient taking differently: Take 1 tablet by mouth every other day. 2-3 times a week)     No current facility-administered medications for this visit.     Allergies:   Patient has no known allergies.    Social History:  The patient  reports that she has never smoked. She has never used smokeless tobacco. She reports that she drinks alcohol. She reports that she does not use drugs.   Family History:  The patient's family history includes Cancer in her brother, father, and mother; Stroke in her father.    ROS:  Please see the history of present illness.   Otherwise, review of systems are positive for none.   All other systems are reviewed and negative.    PHYSICAL EXAM: VS:  BP (!) 206/87   Pulse 72   Ht 5\' 1"  (1.549 m)   Wt 107 lb 9.6 oz (48.8 kg)   SpO2 98%   BMI 20.33 kg/m  , BMI Body mass index is 20.33 kg/m. repeat BP 190/90. GENERAL:  Well appearing, elderly WF in NAD HEENT:  PERRL, EOMI, sclera are clear. Oropharynx is clear. NECK:  No jugular venous distention, carotid upstroke brisk and symmetric, no bruits, no thyromegaly or adenopathy LUNGS:  Clear to auscultation bilaterally CHEST:  Unremarkable HEART:  RRR,  PMI not displaced or sustained,S1 and S2 within normal limits, no S3, no S4: no clicks, no rubs, no murmurs ABD:  Soft, nontender. BS +, no masses or bruits. No hepatomegaly, no splenomegaly EXT:  2 + pulses throughout, no edema, no cyanosis no clubbing SKIN:  Warm and dry.  No rashes NEURO:  Alert and oriented x 3. Cranial nerves II through XII intact. PSYCH:  Cognitively intact   EKG:  EKG is not ordered today.   Recent Labs: 02/07/2017: ALT 14; BUN 13; Creatinine, Ser 0.60; Hemoglobin 12.2; Magnesium 2.1; Platelets 185; Potassium 3.2; Sodium 139    Lipid Panel    Component Value Date/Time   CHOL 189 02/08/2017 0343   TRIG 45 02/08/2017 0343   HDL 67 02/08/2017 0343   CHOLHDL 2.8 02/08/2017 0343   VLDL 9 02/08/2017 0343    LDLCALC 113 (H) 02/08/2017 0343      Wt Readings from Last 3 Encounters:  08/16/17 107 lb 9.6 oz (48.8 kg)  03/27/17 105 lb 12.8 oz (48 kg)  02/08/17 104 lb 0.9 oz (47.2 kg)      Other studies Reviewed:  -- 2D ECHO (05/08/14) EF 55-60%, no RWMA, G2DD, s/p biological Bentall procedure w/ total large replacement of a large arch aneurysm. Mean AV grad WNL for bioprosthesis. No AR. Mod MR thickening. L pleural effusion.    Echo 02/08/17: Study Conclusions  - Left ventricle: The cavity size was normal. Wall thickness was   increased in a pattern of mild LVH. Systolic function was normal.   The estimated  ejection fraction was in the range of 60% to 65%.   Left ventricular diastolic function parameters were normal. - Aortic valve: Aoritc sinuses appear somewhat dilated above AVR   Post Bental AV leaflets not well seen mild gradient Root not well   visualized no AR Valve area (VTI): 1.49 cm^2. Valve area (Vmax):   1.37 cm^2. Valve area (Vmean): 1.3 cm^2. - Mitral valve: Calcified annulus. Mildly thickened leaflets . - Atrial septum: No defect or patent foramen ovale was identified  ASSESSMENT AND PLAN:  1. HTN -BP is labile. Much higher today than usual for her. She is on diltiazem and irbesartan. She is going to try and space out doses so she doesn't have as many dips in BP. Continue diltiazem and Avapro at current doses.   2. 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 Continue ASA.  3. HLD- patient is not on statin. Refuses to take.   4. Mild LV dysfunction EF 45%- now resolved on last ECHO. EF now 60%.  5. Ascending aortic aneurysm s/p extensive Bental procedure with tissue AVR -- 2D ECHO in November 2018 with normal LVF with stable AVR and ascending aortic replacement  6.  CVA 11/18. Continue ASA.  7. Anxiety. I wonder whether some maintenance therapy would be beneficial such as an SSRI. She will discuss with Dr. Laurann Montana.    Current medicines are reviewed at length with the  patient today.  The patient does not have concerns regarding medicines.  The following changes have been made:  See above  Labs/ tests ordered today include: none     Disposition:   FU in 6 months.   Signed, Peter Martinique, MD  08/16/2017 4:55 PM    [place]

## 2017-08-16 ENCOUNTER — Encounter: Payer: Self-pay | Admitting: Cardiology

## 2017-08-16 ENCOUNTER — Ambulatory Visit (INDEPENDENT_AMBULATORY_CARE_PROVIDER_SITE_OTHER): Payer: 59 | Admitting: Cardiology

## 2017-08-16 VITALS — BP 206/87 | HR 72 | Ht 61.0 in | Wt 107.6 lb

## 2017-08-16 DIAGNOSIS — I712 Thoracic aortic aneurysm, without rupture, unspecified: Secondary | ICD-10-CM

## 2017-08-16 DIAGNOSIS — Z952 Presence of prosthetic heart valve: Secondary | ICD-10-CM | POA: Diagnosis not present

## 2017-08-16 DIAGNOSIS — I10 Essential (primary) hypertension: Secondary | ICD-10-CM | POA: Diagnosis not present

## 2017-08-16 NOTE — Patient Instructions (Signed)
Continue your current therapy  I will see you in 6 months.   

## 2017-08-29 ENCOUNTER — Other Ambulatory Visit: Payer: Self-pay | Admitting: Cardiology

## 2017-09-01 ENCOUNTER — Telehealth: Payer: Self-pay | Admitting: Cardiology

## 2017-09-01 MED ORDER — DILTIAZEM HCL 60 MG PO TABS: 60.0000 mg | ORAL_TABLET | Freq: Three times a day (TID) | ORAL | 6 refills | Status: DC

## 2017-09-01 NOTE — Telephone Encounter (Signed)
°*  STAT* If patient is at the pharmacy, call can be transferred to refill team.   1. Which medications need to be refilled? (please list name of each medication and dose if known) Diltiazem 60mg   2. Which pharmacy/location (including street and city if local pharmacy) is medication to be sent to? [facility]  3. Do they need a 30 day or 90 day supply? [place]

## 2017-09-05 ENCOUNTER — Telehealth: Payer: Self-pay | Admitting: Cardiology

## 2017-09-05 NOTE — Telephone Encounter (Signed)
New message    *STAT* If patient is at the pharmacy, call can be transferred to refill team.   1. Which medications need to be refilled? (please list name of each medication and dose if known) diltiazem (CARDIZEM) 60 MG tablet  2. Which pharmacy/location (including street and city if local pharmacy) is medication to be sent to? [facility] [place], [place]  3. Do they need a 30 day or 90 day supply? [place]

## 2017-09-06 MED ORDER — DILTIAZEM HCL 60 MG PO TABS: 60.0000 mg | ORAL_TABLET | Freq: Three times a day (TID) | ORAL | 1 refills | Status: DC

## 2017-09-08 MED ORDER — DILTIAZEM HCL 60 MG PO TABS: 60.0000 mg | ORAL_TABLET | Freq: Three times a day (TID) | ORAL | 3 refills | Status: DC

## 2017-09-08 NOTE — Addendum Note (Signed)
Addended by: Diana Eves on: 09/08/2017 03:28 PM   Modules accepted: Orders

## 2017-09-08 NOTE — Telephone Encounter (Signed)
Rx(s) sent to pharmacy electronically.  

## 2017-11-10 ENCOUNTER — Telehealth: Payer: Self-pay | Admitting: Cardiology

## 2017-11-10 NOTE — Telephone Encounter (Signed)
Patient on this medications for over 2 years; therefore, new symptoms are not likely to be related to medication.   Also noted recent BP readying of 206/87 during office visit.  Please schedule OV with PCP or APP to assess numbness and change therapy as needed.

## 2017-11-10 NOTE — Telephone Encounter (Signed)
New Message   Pt c/o medication issue:  1. Name of Medication: diltiazem (CARDIZEM) 60 MG tablet and irbesartan (AVAPRO) 150 MG tablet  2. How are you currently taking this medication (dosage and times per day)? Take 1 tablet (60 mg total) by mouth 3 (three) times daily and TAKE ONE-HALF (1/2) TABLET TWICE A DAY  3. Are you having a reaction (difficulty breathing--STAT)? yes  4. What is your medication issue? Pt states that one of the medications listed is causing her to have numbness in her fingertips and lips but she doesn't know which one. Please call

## 2017-11-10 NOTE — Telephone Encounter (Signed)
Can either of these cause numbness and lips?  Please advise. Thanks!

## 2017-11-10 NOTE — Telephone Encounter (Signed)
Called patient, advised of note from PharmD. Patient would like to be seen to discuss her issues of the numbness in her lips and fingertips. Patient stated she mentioned it to [place] but she is unaware if he heard her at her visit. Patient scheduled to see Fabian Sharp, PA next Friday 09/06 at 11:00, patient verbalized understanding.

## 2017-11-16 NOTE — Progress Notes (Signed)
Cardiology Office Note:    Date:  11/17/2017   ID:  Yvonne Ballard, DOB 1940/05/02, MRN [id number]  PCP:  Kelton Pillar, MD  Cardiologist:  Peter Martinique, MD   Referring MD: Kelton Pillar, MD   Chief Complaint  Patient presents with  . Follow-up    Hypertension    History of Present Illness:    Yvonne Ballard is a 77 y.o. 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  CVA noted in November 2018. Last echo on 02/08/2017 with normal LV function and diastolic parameters.  She was last seen by Dr. Martinique on 08/16/2017.  At that time her blood pressure had been quite labile.  She was maintained on diltiazem and irbesartan.  The plan was to continue her current doses but to space out the blood pressure medication dosage times.  She presents in clinic today for follow-up.  On 11/10/2017, the patient called stating that she was having lip and finger numbness and questioned if these could be side effects of her Cardizem or irbesartan.  On my interview today she shows me blood pressure readings sometimes taken at 3 AM and then throughout the day, systolic has fluctuated between 120s to 160s.  She is only taking 60 mg of Cardizem twice a day.  She thinks the fluctuations in her blood pressure are making her feel bad, and I tend to agree with her.  She denies chest pain, shortness of breath, palpitations, and dizziness/syncope.    Past Medical History:  Diagnosis Date  . Abnormal ECG   . Anxiety    pt. reports that she is nervous person  . Anxiety disorder   . Ascending aortic aneurysm [facility])    s/p repair with Bental procedure  . Coronary artery disease 03/2014   mild non obstructive CAD  . Dyslipidemia, goal LDL below 70 07/26/2014  . Family history of adverse reaction to anesthesia   . Heart murmur   . History of hiatal hernia   . Hypertension   . 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HCC)   . S/P AVR (aortic  valve replacement)    tissue valve  . Weight loss     Past Surgical History:  Procedure Laterality Date  . BENTALL PROCEDURE N/A 03/31/2014   Procedure: BIO-BENTALL PROCEDURE using a 32mm Magna Ease Aortic Tissue Valve, a 26 mm Terumo Gelweave Valsalva Graft, a 14x10x65mm Terumo Gelweave Graft, and a 77mm straight Hemashield Platinum Graft.;  Surgeon: Ivin Poot, MD;  Location: [place];  Service: Open Heart Surgery;  Laterality: N/A;  . LEFT AND RIGHT HEART CATHETERIZATION WITH CORONARY ANGIOGRAM N/A 03/26/2014   Procedure: LEFT AND RIGHT HEART CATHETERIZATION WITH CORONARY ANGIOGRAM;  Surgeon: Peter M Martinique, MD;  Location: [facility] CATH LAB;  Service: Cardiovascular;  Laterality: N/A;  . TEE WITHOUT CARDIOVERSION N/A 03/31/2014   Procedure: TRANSESOPHAGEAL ECHOCARDIOGRAM (TEE);  Surgeon: Ivin Poot, MD;  Location: [place];  Service: Open Heart Surgery;  Laterality: N/A;  . TONSILLECTOMY    . TUBAL LIGATION      Current Medications: Current Meds  Medication Sig  . ALPRAZolam (NIRAVAM) 0.25 MG dissolvable tablet Take 0.25 mg by mouth daily as needed for anxiety.  Marland Kitchen aspirin 81 MG tablet Take 81 mg by mouth daily.  . hydroxypropyl methylcellulose / hypromellose (ISOPTO TEARS / GONIOVISC) 2.5 % ophthalmic solution Place 1 drop into both eyes as needed for dry eyes.   Marland Kitchen ibuprofen (ADVIL,MOTRIN) 200 MG  tablet Take 200 mg by mouth every 4 (four) hours as needed for headache or mild pain.   Marland Kitchen irbesartan (AVAPRO) 150 MG tablet TAKE ONE-HALF (1/2) TABLET TWICE A DAY  . Multiple Vitamin (MULTIVITAMIN WITH MINERALS) TABS tablet Take 1 tablet by mouth daily. (Patient taking differently: Take 1 tablet by mouth every other day. 2-3 times a week)  . [DISCONTINUED] diltiazem (CARDIZEM) 60 MG tablet Take 1 tablet (60 mg total) by mouth 3 (three) times daily.     Allergies:   Patient has no known allergies.   Social History   Socioeconomic History  . Marital status: Widowed    Spouse name: Not on file    . Number of children: 2  . Years of education: Not on file  . Highest education level: Not on file  Occupational History  . Not on file  Social Needs  . Financial resource strain: Not on file  . Food insecurity:    Worry: Not on file    Inability: Not on file  . Transportation needs:    Medical: Not on file    Non-medical: Not on file  Tobacco Use  . Smoking status: Never Smoker  . Smokeless tobacco: Never Used  Substance and Sexual Activity  . Alcohol use: Yes    Comment: " I like wine", - irregular consumption - socially has wine    . Drug use: No  . Sexual activity: Not on file  Lifestyle  . Physical activity:    Days per week: Not on file    Minutes per session: Not on file  . Stress: Not on file  Relationships  . Social connections:    Talks on phone: Not on file    Gets together: Not on file    Attends religious service: Not on file    Active member of club or organization: Not on file    Attends meetings of clubs or organizations: Not on file    Relationship status: Not on file  Other Topics Concern  . Not on file  Social History Narrative  . Not on file     Family History: The patient's family history includes Cancer in her brother, father, and mother; Stroke in her father.  ROS:   Please see the history of present illness.     All other systems reviewed and are negative.  EKGs/Labs/Other Studies Reviewed:    The following studies were reviewed today:  Echocardiogram 02/08/2017 - Left ventricle: The cavity size was normal. Wall thickness was   increased in a pattern of mild LVH. Systolic function was normal.   The estimated ejection fraction was in the range of 60% to 65%.   Left ventricular diastolic function parameters were normal. - Aortic valve: Aoritc sinuses appear somewhat dilated above AVR   Post Bental AV leaflets not well seen mild gradient Root not well   visualized no AR Valve area (VTI): 1.49 cm^2. Valve area (Vmax):   1.37 cm^2. Valve  area (Vmean): 1.3 cm^2. - Mitral valve: Calcified annulus. Mildly thickened leaflets . - Atrial septum: No defect or patent foramen ovale was identified.   EKG:  EKG is ordered today.  The ekg ordered today demonstrates sinus, nonspecific ST changes that are unchanged from prior  Recent Labs: 02/07/2017: ALT 14; BUN 13; Creatinine, Ser 0.60; Hemoglobin 12.2; Magnesium 2.1; Platelets 185; Potassium 3.2; Sodium 139  Recent Lipid Panel    Component Value Date/Time   CHOL 189 02/08/2017 0343   TRIG 45 02/08/2017 0343  HDL 67 02/08/2017 0343   CHOLHDL 2.8 02/08/2017 0343   VLDL 9 02/08/2017 0343   LDLCALC 113 (H) 02/08/2017 0343    Physical Exam:    VS:  BP (!) 165/82   Pulse 63   Ht 5' (1.524 m)   Wt 106 lb 9.6 oz (48.4 kg)   BMI 20.82 kg/m     Wt Readings from Last 3 Encounters:  11/17/17 106 lb 9.6 oz (48.4 kg)  08/16/17 107 lb 9.6 oz (48.8 kg)  03/27/17 105 lb 12.8 oz (48 kg)     GEN: Well nourished, well developed in no acute distress HEENT: Normal NECK: No JVD; No carotid bruits LYMPHATICS: No lymphadenopathy CARDIAC: RRR, no murmurs, rubs, gallops RESPIRATORY:  Clear to auscultation without rales, wheezing or rhonchi  ABDOMEN: Soft, non-tender, non-distended MUSCULOSKELETAL:  No edema; No deformity  SKIN: Warm and dry NEUROLOGIC:  Alert and oriented x 3 PSYCHIATRIC:  Normal affect   ASSESSMENT:    1. Essential hypertension   2. Coronary artery disease involving native coronary artery of native heart without angina pectoris   3. Ascending aortic aneurysm ([place])   4. S/P AVR (aortic valve replacement)    PLAN:    In order of problems listed above:  Essential hypertension - Plan: EKG 12-Lead Her blood pressure readings from home are quite labile.  She is only been taking 60 mg of Cardizem twice per day and 75 mg of irbesartan.  She would like to go back to the long-acting version of Cardizem.  I agree that this will decrease her fluctuations in blood  pressure and likely make her feel better.  Based on her current dose, I will start 120 mg of Cartia every morning and continue 75 mg of irbesartan every night.  We also discussed only checking her blood pressure once per day 2 hours after her morning Cardizem dose.  She will keep a blood pressure log for 2 weeks and call me with the results.  Coronary artery disease involving native coronary artery of native heart without angina pectoris Stable, no anginal symptoms.  Continue aspirin.  Ascending aortic aneurysm (HCC) S/P AVR (aortic valve replacement) Stable.  No medication changes.   Follow-up with me in 1 month.   Medication Adjustments/Labs and Tests Ordered: Current medicines are reviewed at length with the patient today.  Concerns regarding medicines are outlined above.  Orders Placed This Encounter  Procedures  . EKG 12-Lead   Meds ordered this encounter  Medications  . diltiazem (CARDIZEM CD) 120 MG 24 hr capsule    Sig: Take 1 capsule (120 mg total) by mouth every morning.    Dispense:  30 capsule    Refill:  3    Signed, Ledora Bottcher, [place]  11/17/2017 12:57 PM    [facility]

## 2017-11-17 ENCOUNTER — Ambulatory Visit (INDEPENDENT_AMBULATORY_CARE_PROVIDER_SITE_OTHER): Payer: 59 | Admitting: Physician Assistant

## 2017-11-17 ENCOUNTER — Encounter: Payer: Self-pay | Admitting: Physician Assistant

## 2017-11-17 VITALS — BP 165/82 | HR 63 | Ht 60.0 in | Wt 106.6 lb

## 2017-11-17 DIAGNOSIS — I712 Thoracic aortic aneurysm, without rupture: Secondary | ICD-10-CM

## 2017-11-17 DIAGNOSIS — I7121 Aneurysm of the ascending aorta, without rupture: Secondary | ICD-10-CM

## 2017-11-17 DIAGNOSIS — I10 Essential (primary) hypertension: Secondary | ICD-10-CM | POA: Diagnosis not present

## 2017-11-17 DIAGNOSIS — I2510 Atherosclerotic heart disease of native coronary artery without angina pectoris: Secondary | ICD-10-CM | POA: Diagnosis not present

## 2017-11-17 DIAGNOSIS — Z952 Presence of prosthetic heart valve: Secondary | ICD-10-CM

## 2017-11-17 MED ORDER — DILTIAZEM HCL ER COATED BEADS 120 MG PO CP24: 120.0000 mg | ORAL_CAPSULE | ORAL | 3 refills | Status: DC

## 2017-11-17 NOTE — Patient Instructions (Signed)
Medication Instructions: Discontinue Cardizem   START Cartia XT (Diltiazem) 120 mg--take 1 cap by mouth every morning.  Take 1/2 tab of Irbesartan in the evening.    Follow-Up: Your physician recommends that you schedule a follow-up appointment in: 1 month with Fabian Sharp, PA.   Any Other Special Instructions are listed below:  Your physician has requested that you regularly monitor and record your blood pressure readings at home for 2 weeks. Please measure your blood pressure 2 hours after taking the Diltiazem. Please use the same machine at the same time of day to check your readings and record them to bring to your follow-up visit.  If you need a refill on your cardiac medications before your next appointment, please call your pharmacy.

## 2017-12-18 NOTE — Progress Notes (Signed)
Cardiology Office Note:    Date:  12/19/2017   ID:  Yvonne Ballard, DOB 1940-07-28, MRN [id number]  PCP:  Kelton Pillar, MD  Cardiologist:  Peter Martinique, MD   Referring MD: Kelton Pillar, MD   Chief Complaint  Patient presents with  . Follow-up    labile BP    History of Present Illness:    Yvonne Ballard is a 77 y.o. 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  CVA noted in November 2018. Last echo on 02/08/2017 with normal LV function and diastolic parameters.  She was seen by Dr. Martinique on 08/16/2017.  At that time her blood pressure had been quite labile.  She was maintained on diltiazem and irbesartan.  The plan was to continue her current doses but to space out the blood pressure medication dosage times. I saw her in follow up on 11/17/17. She brought BP logs with labile pressures. I changed her back to the long-acting version of cardizem - 120 mg daily in addition to 75 mg irbesartan at night.   She returns today with BP log.  She states that she has been taking 120 mg of Cardizem and 75 mg of irbesartan in the morning. She occasionally takes an extra 60 mg tablet of cardizem at night. She does this 1-2 times per week. She takes ASA sporadically. Her daily BP log shows BP much less labile with systolic ranging in the 703-500X. She feels much better and generally has no complaints. She does not wish to make any changes today and I agree with her. She feels stronger now and less fragile.   Past Medical History:  Diagnosis Date  . Abnormal ECG   . Anxiety    pt. reports that she is nervous person  . Anxiety disorder   . Ascending aortic aneurysm [facility])    s/p repair with Bental procedure  . Coronary artery disease 03/2014   mild non obstructive CAD  . Dyslipidemia, goal LDL below 70 07/26/2014  . Family history of adverse reaction to anesthesia   . Heart murmur   . History of hiatal hernia     . Hypertension   . 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HCC)   . S/P AVR (aortic valve replacement)    tissue valve  . Weight loss     Past Surgical History:  Procedure Laterality Date  . BENTALL PROCEDURE N/A 03/31/2014   Procedure: BIO-BENTALL PROCEDURE using a 48mm Magna Ease Aortic Tissue Valve, a 26 mm Terumo Gelweave Valsalva Graft, a 14x10x84mm Terumo Gelweave Graft, and a 70mm straight Hemashield Platinum Graft.;  Surgeon: Ivin Poot, MD;  Location: [place];  Service: Open Heart Surgery;  Laterality: N/A;  . LEFT AND RIGHT HEART CATHETERIZATION WITH CORONARY ANGIOGRAM N/A 03/26/2014   Procedure: LEFT AND RIGHT HEART CATHETERIZATION WITH CORONARY ANGIOGRAM;  Surgeon: Peter M Martinique, MD;  Location: [facility] CATH LAB;  Service: Cardiovascular;  Laterality: N/A;  . TEE WITHOUT CARDIOVERSION N/A 03/31/2014   Procedure: TRANSESOPHAGEAL ECHOCARDIOGRAM (TEE);  Surgeon: Ivin Poot, MD;  Location: [place];  Service: Open Heart Surgery;  Laterality: N/A;  . TONSILLECTOMY    . TUBAL LIGATION      Current Medications: Current Meds  Medication Sig  . ALPRAZolam (NIRAVAM) 0.25 MG dissolvable tablet Take 0.25 mg by mouth daily as needed for anxiety.  Marland Kitchen aspirin 81 MG tablet Take 81 mg by mouth.   . diltiazem (CARDIZEM CD) 120  MG 24 hr capsule Take 1 capsule (120 mg total) by mouth every morning.  Marland Kitchen ibuprofen (ADVIL,MOTRIN) 200 MG tablet Take 200 mg by mouth every 4 (four) hours as needed for headache or mild pain.   Marland Kitchen irbesartan (AVAPRO) 150 MG tablet TAKE ONE-HALF (1/2) TABLET TWICE A DAY  . Multiple Vitamins-Minerals (MULTIVITAMIN WITH MINERALS) tablet Take 1 tablet by mouth.  . [DISCONTINUED] hydroxypropyl methylcellulose / hypromellose (ISOPTO TEARS / GONIOVISC) 2.5 % ophthalmic solution Place 1 drop into both eyes as needed for dry eyes.   . [DISCONTINUED] Multiple Vitamin (MULTIVITAMIN WITH MINERALS) TABS tablet Take 1 tablet by mouth daily. (Patient taking differently: Take 1 tablet by mouth. )      Allergies:   Patient has no known allergies.   Social History   Socioeconomic History  . Marital status: Widowed    Spouse name: Not on file  . Number of children: 2  . Years of education: Not on file  . Highest education level: Not on file  Occupational History  . Not on file  Social Needs  . Financial resource strain: Not on file  . Food insecurity:    Worry: Not on file    Inability: Not on file  . Transportation needs:    Medical: Not on file    Non-medical: Not on file  Tobacco Use  . Smoking status: Never Smoker  . Smokeless tobacco: Never Used  Substance and Sexual Activity  . Alcohol use: Yes    Comment: " I like wine", - irregular consumption - socially has wine    . Drug use: No  . Sexual activity: Not on file  Lifestyle  . Physical activity:    Days per week: Not on file    Minutes per session: Not on file  . Stress: Not on file  Relationships  . Social connections:    Talks on phone: Not on file    Gets together: Not on file    Attends religious service: Not on file    Active member of club or organization: Not on file    Attends meetings of clubs or organizations: Not on file    Relationship status: Not on file  Other Topics Concern  . Not on file  Social History Narrative  . Not on file     Family History: The patient's family history includes Cancer in her brother, father, and mother; Stroke in her father.  ROS:   Please see the history of present illness.     All other systems reviewed and are negative.  EKGs/Labs/Other Studies Reviewed:    The following studies were reviewed today:  Echo 02/08/17: Study Conclusions - Left ventricle: The cavity size was normal. Wall thickness was   increased in a pattern of mild LVH. Systolic function was normal.   The estimated ejection fraction was in the range of 60% to 65%.   Left ventricular diastolic function parameters were normal. - Aortic valve: Aoritc sinuses appear somewhat dilated above  AVR   Post Bental AV leaflets not well seen mild gradient Root not well   visualized no AR Valve area (VTI): 1.49 cm^2. Valve area (Vmax):   1.37 cm^2. Valve area (Vmean): 1.3 cm^2. - Mitral valve: Calcified annulus. Mildly thickened leaflets . - Atrial septum: No defect or patent foramen ovale was identified.  EKG:  EKG is not ordered today.     Recent Labs: 02/07/2017: ALT 14; BUN 13; Creatinine, Ser 0.60; Hemoglobin 12.2; Magnesium 2.1; Platelets 185; Potassium 3.2;  Sodium 139  Recent Lipid Panel    Component Value Date/Time   CHOL 189 02/08/2017 0343   TRIG 45 02/08/2017 0343   HDL 67 02/08/2017 0343   CHOLHDL 2.8 02/08/2017 0343   VLDL 9 02/08/2017 0343   LDLCALC 113 (H) 02/08/2017 0343    Physical Exam:    VS:  BP 140/62 (BP Location: Right Arm, Patient Position: Sitting, Cuff Size: Normal)   Pulse 65   Ht 5' (1.524 m)   Wt 106 lb (48.1 kg)   BMI 20.70 kg/m     Wt Readings from Last 3 Encounters:  12/19/17 106 lb (48.1 kg)  11/17/17 106 lb 9.6 oz (48.4 kg)  08/16/17 107 lb 9.6 oz (48.8 kg)     GEN: Well nourished, well developed in no acute distress HEENT: Normal NECK: No JVD; No carotid bruits LYMPHATICS: No lymphadenopathy CARDIAC: RRR, + systolic murmurs, rubs, gallops RESPIRATORY:  Clear to auscultation without rales, wheezing or rhonchi  ABDOMEN: Soft, non-tender, non-distended MUSCULOSKELETAL:  No edema; No deformity  SKIN: Warm and dry NEUROLOGIC:  Alert and oriented x 3 PSYCHIATRIC:  Normal affect   ASSESSMENT:    1. Essential hypertension   2. Ascending aortic aneurysm ([place])   3. Coronary artery disease involving native coronary artery of native heart without angina pectoris    PLAN:    In order of problems listed above:  Essential hypertension She is doing well on the 120 mg diltiazem and 75 mg of irbesartan.  She occasionally takes an extra 60 mg Cardizem tablet at night.  She does this 1-3 times per week.  Her blood pressure is much less  labile and she is feeling much stronger and less fragile than previous visit.  I will make no changes today.  Ascending aortic aneurysm (HCC) Stable, baseline shortness of breath.  Coronary artery disease involving native coronary artery of native heart without angina pectoris No chest pain.  She takes aspirin 81 mg sporadically.  Follow-up with Dr. Martinique in 6 months.   Medication Adjustments/Labs and Tests Ordered: Current medicines are reviewed at length with the patient today.  Concerns regarding medicines are outlined above.  No orders of the defined types were placed in this encounter.  No orders of the defined types were placed in this encounter.   Signed, Ledora Bottcher, PA  12/19/2017 11:10 AM    [facility]

## 2017-12-19 ENCOUNTER — Encounter: Payer: Self-pay | Admitting: Physician Assistant

## 2017-12-19 ENCOUNTER — Ambulatory Visit (INDEPENDENT_AMBULATORY_CARE_PROVIDER_SITE_OTHER): Payer: 59 | Admitting: Physician Assistant

## 2017-12-19 VITALS — BP 140/62 | HR 65 | Ht 60.0 in | Wt 106.0 lb

## 2017-12-19 DIAGNOSIS — I7121 Aneurysm of the ascending aorta, without rupture: Secondary | ICD-10-CM

## 2017-12-19 DIAGNOSIS — I712 Thoracic aortic aneurysm, without rupture: Secondary | ICD-10-CM | POA: Diagnosis not present

## 2017-12-19 DIAGNOSIS — I2510 Atherosclerotic heart disease of native coronary artery without angina pectoris: Secondary | ICD-10-CM

## 2017-12-19 DIAGNOSIS — I10 Essential (primary) hypertension: Secondary | ICD-10-CM | POA: Diagnosis not present

## 2017-12-19 NOTE — Patient Instructions (Signed)
Medication Instructions:  Your physician recommends that you continue on your current medications as directed. Please refer to the Current Medication list given to you today.  If you need a refill on your cardiac medications before your next appointment, please call your pharmacy.   Lab work: None  If you have labs (blood work) drawn today and your tests are completely normal, you will receive your results only by: Marland Kitchen MyChart Message (if you have MyChart) OR . A paper copy in the mail If you have any lab test that is abnormal or we need to change your treatment, we will call you to review the results.  Testing/Procedures: None   Follow-Up: At [facility], you and your health needs are our priority.  As part of our continuing mission to provide you with exceptional heart care, we have created designated Provider Care Teams.  These Care Teams include your primary Cardiologist (physician) and Advanced Practice Providers (APPs -  Physician Assistants and Nurse Practitioners) who all work together to provide you with the care you need, when you need it. You will need a follow up appointment in 6 months.  Please call our office 2 months in advance to schedule this appointment.  You may see Peter Martinique, MD or one of the following Advanced Practice Providers on your designated Care Team: [place], [place] . Fabian Sharp, PA-C  Any Other Special Instructions Will Be Listed Below (If Applicable).

## 2017-12-22 ENCOUNTER — Ambulatory Visit: Payer: 59 | Admitting: Physician Assistant

## 2017-12-25 ENCOUNTER — Ambulatory Visit: Payer: 59 | Admitting: Physician Assistant

## 2018-01-03 ENCOUNTER — Other Ambulatory Visit: Payer: Self-pay | Admitting: *Deleted

## 2018-01-03 MED ORDER — DILTIAZEM HCL ER COATED BEADS 120 MG PO CP24: 120.0000 mg | ORAL_CAPSULE | ORAL | 1 refills | Status: DC

## 2018-03-28 ENCOUNTER — Telehealth: Payer: Self-pay | Admitting: Cardiology

## 2018-03-28 NOTE — Telephone Encounter (Signed)
New Message:   Pt is having irregular heart beats.She wants to know what medicine should she take,to make it stop.

## 2018-03-28 NOTE — Telephone Encounter (Signed)
Returned call to patient no answer.LMTC. 

## 2018-03-28 NOTE — Telephone Encounter (Signed)
Received call back from patient she stated when she checked B/P this morning red heart appeared indicating irreg heart beat.Stated she did not noticed irreg heart beat.B/P 159/74 pulse 63.Stated she took morning medications along with a alprazolam.Stated she rechecked B/P after medications 140/69 pulse 50.Stated she feels fine just made her nervous when she saw red heart.Red heart did not appear 2nd time.Advised if she has any more irregular heart beat she can take short acting diltiazem 60 mg.Advised to keep appointment as planned with Dr.Jordan and call sooner if needed.

## 2018-04-03 ENCOUNTER — Telehealth: Payer: Self-pay | Admitting: Cardiology

## 2018-04-03 NOTE — Telephone Encounter (Signed)
Triage call received from operator. The pt has been having elevated BP over the last 3 days and currently has slurred speak on the phone. Spoke with pt. Pt speak is audibly slurred and her words are jumbled. Adv pt that I am concerned about the change in speach and her hx of stroke. Asked pt if she is home alone. Pt sts that she is. Adv pt that I recommend that she hang up and call 911. Pt became irritated and states that she will not go to the ED. She just needs her medications to be re-evaluated. Reiterated my concerns and adv pt that if she is having a neurological event the delay in treatment could lead to a negative outcome. Adv pt that I would again recommend she call 911 and seek emergent care asap. Pt sts that she does not care if she is having a stroke she is not going to the ED. Adv pt again to contact EMS. Pt sts that she would consider my recommendation and then disconnected the call.  DPR on file. Attempted to contact the pt daughter. One of the tel #'s listed was disconnected. I dialed the other number listed and the pt picked up the phone. She asked why I was trying to reach her daughter. Pt stated that I was not to contact her daughter and make her aware of the pt symptoms and my recommendation. Adv pt that the decision to call EMS and got to the ED was ultimately hers, but I strongly recommend that she does. She says she will not and she will see [place] in April. Pt disconnected the call again.

## 2018-04-03 NOTE — Telephone Encounter (Signed)
  Pt c/o BP issue: STAT if pt c/o blurred vision, one-sided weakness or slurred speech  1. What are your last 5 BP readings? 183/100 HR 78 this morning  2. Are you having any other symptoms (ex. Dizziness, headache, blurred vision, passed out)? Some slurred speech  3. What is your BP issue?  Patient has followed instructions regarding medication but her BP is still running high. BP this morning was 183/100 HR 78.

## 2018-04-14 ENCOUNTER — Emergency Department (HOSPITAL_COMMUNITY)
Admission: EM | Admit: 2018-04-14 | Discharge: 2018-04-14 | Disposition: A | Discharge status: Home / Self Care | Payer: 59 | Attending: Emergency Medicine | Admitting: Emergency Medicine

## 2018-04-14 ENCOUNTER — Emergency Department (HOSPITAL_COMMUNITY): Payer: 59

## 2018-04-14 ENCOUNTER — Other Ambulatory Visit: Payer: Self-pay

## 2018-04-14 DIAGNOSIS — I10 Essential (primary) hypertension: Secondary | ICD-10-CM | POA: Insufficient documentation

## 2018-04-14 DIAGNOSIS — R479 Unspecified speech disturbances: Secondary | ICD-10-CM | POA: Diagnosis not present

## 2018-04-14 DIAGNOSIS — R4701 Aphasia: Secondary | ICD-10-CM | POA: Diagnosis present

## 2018-04-14 DIAGNOSIS — R0609 Other forms of dyspnea: Secondary | ICD-10-CM | POA: Insufficient documentation

## 2018-04-14 LAB — COMPREHENSIVE METABOLIC PANEL
ALT: 15 U/L (ref 0–44)
AST: 19 U/L (ref 15–41)
Albumin: 3.7 g/dL (ref 3.5–5.0)
Alkaline Phosphatase: 59 U/L (ref 38–126)
Anion gap: 12 (ref 5–15)
BUN: 12 mg/dL (ref 8–23)
CO2: 21 mmol/L — ABNORMAL LOW (ref 22–32)
Calcium: 9 mg/dL (ref 8.9–10.3)
Chloride: 108 mmol/L (ref 98–111)
Creatinine, Ser: 0.74 mg/dL (ref 0.44–1.00)
Glucose, Bld: 113 mg/dL — ABNORMAL HIGH (ref 70–99)
POTASSIUM: 3.7 mmol/L (ref 3.5–5.1)
Sodium: 141 mmol/L (ref 135–145)
TOTAL PROTEIN: 6.1 g/dL — AB (ref 6.5–8.1)
Total Bilirubin: 0.4 mg/dL (ref 0.3–1.2)

## 2018-04-14 LAB — URINALYSIS, ROUTINE W REFLEX MICROSCOPIC
BILIRUBIN URINE: NEGATIVE
Bacteria, UA: NONE SEEN
GLUCOSE, UA: NEGATIVE mg/dL
Ketones, ur: NEGATIVE mg/dL
Nitrite: NEGATIVE
PROTEIN: NEGATIVE mg/dL
SPECIFIC GRAVITY, URINE: 1.006 (ref 1.005–1.030)
pH: 7 (ref 5.0–8.0)

## 2018-04-14 LAB — CBC
HCT: 36.1 % (ref 36.0–46.0)
Hemoglobin: 11.5 g/dL — ABNORMAL LOW (ref 12.0–15.0)
MCH: 29.4 pg (ref 26.0–34.0)
MCHC: 31.9 g/dL (ref 30.0–36.0)
MCV: 92.3 fL (ref 80.0–100.0)
PLATELETS: 162 10*3/uL (ref 150–400)
RBC: 3.91 MIL/uL (ref 3.87–5.11)
RDW: 13 % (ref 11.5–15.5)
WBC: 5.3 10*3/uL (ref 4.0–10.5)
nRBC: 0 % (ref 0.0–0.2)

## 2018-04-14 LAB — TROPONIN I

## 2018-04-14 NOTE — ED Notes (Signed)
Gray tube (UC) sent down with UA.

## 2018-04-14 NOTE — ED Provider Notes (Signed)
[facility] Emergency Department Provider Note MRN:  [id number]  Arrival date & time: 04/14/18     Chief Complaint   Aphasia and Medication Refill   History of Present Illness   Yvonne Ballard is a 78 y.o. year-old female with a history of CAD, aortic aneurysm, aortic valve replacement presenting to the ED with chief complaint of aphasia.  Over 1 week ago, patient noticed that her speech had changed.  Unable to get the words out as fast as she used to, clumsy with her words.  For several weeks also endorsing increased dyspnea on exertion, pain in the "left kidney".  Patient explains that she lives alone for half of the week and thinks that she is not getting enough care at home.  Denies fever, no cough, no chest pain, no shortness of breath, no abdominal pain, no dysuria.  Symptoms are constant, no exacerbating relieving factors.  Review of Systems  A complete 10 system review of systems was obtained and all systems are negative except as noted in the HPI and PMH.   Patient's Health History    Past Medical History:  Diagnosis Date  . Abnormal ECG   . Anxiety    pt. reports that she is nervous person  . Anxiety disorder   . Ascending aortic aneurysm [facility])    s/p repair with Bental procedure  . Coronary artery disease 03/2014   mild non obstructive CAD  . Dyslipidemia, goal LDL below 70 07/26/2014  . Family history of adverse reaction to anesthesia   . Heart murmur   . History of hiatal hernia   . Hypertension   . 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HCC)   . S/P AVR (aortic valve replacement)    tissue valve  . Weight loss     Past Surgical History:  Procedure Laterality Date  . BENTALL PROCEDURE N/A 03/31/2014   Procedure: BIO-BENTALL PROCEDURE using a 22mm Magna Ease Aortic Tissue Valve, a 26 mm Terumo Gelweave Valsalva Graft, a 14x10x79mm Terumo Gelweave Graft, and a 76mm straight Hemashield Platinum Graft.;  Surgeon: Ivin Poot, MD;  Location: [place];   Service: Open Heart Surgery;  Laterality: N/A;  . LEFT AND RIGHT HEART CATHETERIZATION WITH CORONARY ANGIOGRAM N/A 03/26/2014   Procedure: LEFT AND RIGHT HEART CATHETERIZATION WITH CORONARY ANGIOGRAM;  Surgeon: Peter M Martinique, MD;  Location: [facility] CATH LAB;  Service: Cardiovascular;  Laterality: N/A;  . TEE WITHOUT CARDIOVERSION N/A 03/31/2014   Procedure: TRANSESOPHAGEAL ECHOCARDIOGRAM (TEE);  Surgeon: Ivin Poot, MD;  Location: [place];  Service: Open Heart Surgery;  Laterality: N/A;  . TONSILLECTOMY    . TUBAL LIGATION      Family History  Problem Relation Age of Onset  . Cancer Mother   . Cancer Father   . Stroke Father   . Cancer Brother     Social History   Socioeconomic History  . Marital status: Widowed    Spouse name: Not on file  . Number of children: 2  . Years of education: Not on file  . Highest education level: Not on file  Occupational History  . Not on file  Social Needs  . Financial resource strain: Not on file  . Food insecurity:    Worry: Not on file    Inability: Not on file  . Transportation needs:    Medical: Not on file    Non-medical: Not on file  Tobacco Use  . Smoking status: Never Smoker  . Smokeless tobacco: Never Used  Substance  and Sexual Activity  . Alcohol use: Yes    Comment: " I like wine", - irregular consumption - socially has wine    . Drug use: No  . Sexual activity: Not on file  Lifestyle  . Physical activity:    Days per week: Not on file    Minutes per session: Not on file  . Stress: Not on file  Relationships  . Social connections:    Talks on phone: Not on file    Gets together: Not on file    Attends religious service: Not on file    Active member of club or organization: Not on file    Attends meetings of clubs or organizations: Not on file    Relationship status: Not on file  . Intimate partner violence:    Fear of current or ex partner: Not on file    Emotionally abused: Not on file    Physically abused: Not on file     Forced sexual activity: Not on file  Other Topics Concern  . Not on file  Social History Narrative  . Not on file     Physical Exam  Vital Signs and Nursing Notes reviewed Vitals:   04/14/18 1732 04/14/18 1744  BP: (!) 144/72   Pulse: (!) 54 60  Resp: (!) 22 18  Temp:    SpO2: 100% 100%    CONSTITUTIONAL: Well-appearing, NAD NEURO:  Alert and oriented x 3, normal and symmetric strength and sensation, normal coordination, rapid speech EYES:  eyes equal and reactive, no visual field cuts, no nystagmus ENT/NECK:  no LAD, no JVD CARDIO: Regular rate, well-perfused, normal S1 and S2 PULM:  CTAB no wheezing or rhonchi GI/GU:  normal bowel sounds, non-distended, non-tender MSK/SPINE:  No gross deformities, no edema SKIN:  no rash, atraumatic PSYCH: Anxious speech and behavior  Diagnostic and Interventional Summary    EKG Interpretation  Date/Time:  Saturday April 14 2018 15:44:54 EST Ventricular Rate:  61 PR Interval:    QRS Duration: 136 QT Interval:  449 QTC Calculation: 453 R Axis:   22 Text Interpretation:  Sinus rhythm Right bundle branch block Nonspecific repol abnormality, lateral leads Confirmed by Gerlene Fee [phone]) on 04/14/2018 3:53:42 PM      Labs Reviewed  CBC - Abnormal; Notable for the following components:      Result Value   Hemoglobin 11.5 (*)    All other components within normal limits  COMPREHENSIVE METABOLIC PANEL - Abnormal; Notable for the following components:   CO2 21 (*)    Glucose, Bld 113 (*)    Total Protein 6.1 (*)    All other components within normal limits  URINALYSIS, ROUTINE W REFLEX MICROSCOPIC - Abnormal; Notable for the following components:   Color, Urine STRAW (*)    Hgb urine dipstick SMALL (*)    Leukocytes, UA SMALL (*)    All other components within normal limits  TROPONIN I    MR BRAIN WO CONTRAST  Final Result    DG Chest 2 View  Final Result    CT HEAD WO CONTRAST  Final Result      Medications - No  data to display   Procedures Critical Care  ED Course and Medical Decision Making  I have reviewed the triage vital signs and the nursing notes.  Pertinent labs & imaging results that were available during my care of the patient were reviewed by me and considered in my medical decision making (see below for details).  Will need work-up to evaluate for completed stroke in this 78 year old female with history of CAD, aneurysm, AVR.  With the exception of abnormal speech, no focal neurological deficits.  Unclear baseline of speech.  Will begin with CT head, labs to evaluate for metabolic disarray, UA.  Work-up unremarkable, MRI with no acute changes.  Discussing more with patient and with daughter present, seems to be more of a increased anxiety state recently.  Will follow-up with PCP.  After the discussed management above, the patient was determined to be safe for discharge.  The patient was in agreement with this plan and all questions regarding their care were answered.  ED return precautions were discussed and the patient will return to the ED with any significant worsening of condition.  Barth Kirks. Sedonia Small, MD [employer] [email or web address]  Final Clinical Impressions(s) / ED Diagnoses     ICD-10-CM   1. Speech disturbance, unspecified type R47.9   2. DOE (dyspnea on exertion) R06.09 DG Chest 2 View    DG Chest 2 View    ED Discharge Orders    None         Maudie Flakes, MD 04/14/18 2014

## 2018-04-14 NOTE — ED Triage Notes (Signed)
Patient BIB taxi. Per note in chart and per patient she called her cardiologist last week, staff noticed slurred speech and asked her to come in and get evaluated but she reportedly got upset and hung up and stated she only wanted her blood pressure medicine. Patient has slurred speech and trouble expressing herself during triage.

## 2018-04-14 NOTE — ED Notes (Signed)
Patient transported to CT 

## 2018-04-14 NOTE — ED Notes (Addendum)
Patient transported to MRI 

## 2018-04-14 NOTE — Discharge Instructions (Addendum)
You were evaluated in the Emergency Department and after careful evaluation, we did not find any emergent condition requiring admission or further testing in the hospital. ° °Please return to the Emergency Department if you experience any worsening of your condition.  We encourage you to follow up with a primary care provider.  Thank you for allowing us to be a part of your care. °

## 2018-04-14 NOTE — ED Triage Notes (Addendum)
Pt arrived at front desk with noted slurred speech. Pt arrived at ED by herself. States she has been out of her BP medications and needs a refil. Pt upset when asked about the slurred speech. Noted in the chart, a phone call with cardiology RN where staff noted she had slurred speech on the 21st and was advised to go to ED on 1/21.

## 2018-04-14 NOTE — ED Notes (Signed)
Patient verbalizes understanding of discharge instructions. Opportunity for questioning and answers were provided. Armband removed by staff, pt discharged from ED.  

## 2018-06-08 ENCOUNTER — Encounter: Payer: Self-pay | Admitting: Cardiology

## 2018-06-20 ENCOUNTER — Ambulatory Visit: Payer: 59 | Admitting: Cardiology

## 2018-06-25 ENCOUNTER — Other Ambulatory Visit: Payer: Self-pay | Admitting: Cardiology

## 2018-11-04 NOTE — Progress Notes (Signed)
Cardiology Office Note   Date:  11/05/2018   ID:  Yvonne Ballard, DOB March 28, 1940, MRN [id number]  PCP:  Kelton Pillar, MD  Cardiologist:  Dr. Martinique   History of Present Illness: Yvonne Ballard is a 78 y.o. female with a hx of HTN, HLD, thoracic aortic aneurysm s/p Bental procedure (03/2014), and nonobst CAD who presents for follow up   She was initially seen in 03/2014. A 2D echo was done which showed a large thoracic aneurysm and the patient underwent heart catheterization showing mild nonobstructive CAD with mild LV dysfunction EF 45% with thoracic aneurysm and mildly elevated PA pressures. She underwent ascending and arch TAA repair with Bio-Bental procedure with tissue AVR and aorto-brachiocephalic bypass and aorto-left common carotid bypass on 03/31/2014.   She was seen in the ED in December 2017 with a right radial fracture following a fall. Managed by Ortho without surgery.  She was seen in the ED in July 2018 after a fall with pelvic fracture. This was treated conservatively.  She was admitted in November 2018 with left facial droop and dysarthria. CT acutely was negative. MRI significant for acute infarction involving the right posterior lentiform nucleus, mid corona radiata, and caudate body. She had evidence of moderate chronic microvascular changes. She was treated with lipitor and ASA.   She was seen in February 2020 in the ED with some dysarthria. MRI and CT of the head showed no acute changes with chronic microvascular disease.   On follow up today she is doing well from a cardiac standpoint. She denies any chest pain or SOB. She feels that she is falling apart slowly. She is taking her 120 mg diltiazem in the am and if needed 30 mg in the pm. She takes irbesartan only intermittently- states it makes her feel puffy. Nerves are bad. She takes Xanax prn.    Past Medical History:  Diagnosis Date  . Abnormal ECG   . Anxiety    pt. reports that she is nervous person  .  Anxiety disorder   . Ascending aortic aneurysm [facility])    s/p repair with Bental procedure  . Coronary artery disease 03/2014   mild non obstructive CAD  . Dyslipidemia, goal LDL below 70 07/26/2014  . Family history of adverse reaction to anesthesia   . Heart murmur   . History of hiatal hernia   . Hypertension   . 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HCC)   . S/P AVR (aortic valve replacement)    tissue valve  . Weight loss     Past Surgical History:  Procedure Laterality Date  . BENTALL PROCEDURE N/A 03/31/2014   Procedure: BIO-BENTALL PROCEDURE using a 2mm Magna Ease Aortic Tissue Valve, a 26 mm Terumo Gelweave Valsalva Graft, a 14x10x23mm Terumo Gelweave Graft, and a 69mm straight Hemashield Platinum Graft.;  Surgeon: Ivin Poot, MD;  Location: [place];  Service: Open Heart Surgery;  Laterality: N/A;  . LEFT AND RIGHT HEART CATHETERIZATION WITH CORONARY ANGIOGRAM N/A 03/26/2014   Procedure: LEFT AND RIGHT HEART CATHETERIZATION WITH CORONARY ANGIOGRAM;  Surgeon: Peter M Martinique, MD;  Location: [facility] CATH LAB;  Service: Cardiovascular;  Laterality: N/A;  . TEE WITHOUT CARDIOVERSION N/A 03/31/2014   Procedure: TRANSESOPHAGEAL ECHOCARDIOGRAM (TEE);  Surgeon: Ivin Poot, MD;  Location: [place];  Service: Open Heart Surgery;  Laterality: N/A;  . TONSILLECTOMY    . TUBAL LIGATION       Current Outpatient Medications  Medication Sig Dispense Refill  . ALPRAZolam (NIRAVAM) 0.25 MG  dissolvable tablet Take 0.25 mg by mouth daily as needed for anxiety.    Marland Kitchen aspirin 81 MG tablet Take 81 mg by mouth.     . diltiazem (CARDIZEM CD) 120 MG 24 hr capsule TAKE 1 CAPSULE EVERY MORNING 90 capsule 3  . diltiazem (CARDIZEM) 60 MG tablet Take 30 mg by mouth as needed.     Marland Kitchen ibuprofen (ADVIL,MOTRIN) 200 MG tablet Take 200 mg by mouth every 4 (four) hours as needed for headache or mild pain.     Marland Kitchen irbesartan (AVAPRO) 150 MG tablet TAKE ONE-HALF (1/2) TABLET TWICE A DAY (Patient taking differently: Take 75 mg by  mouth 2 (two) times daily. ) 90 tablet 3  . Multiple Vitamins-Minerals (MULTIVITAMIN WITH MINERALS) tablet Take 1 tablet by mouth.     No current facility-administered medications for this visit.     Allergies:   Patient has no known allergies.    Social History:  The patient  reports that she has never smoked. She has never used smokeless tobacco. She reports current alcohol use. She reports that she does not use drugs.   Family History:  The patient's family history includes Cancer in her brother, father, and mother; Stroke in her father.    ROS:  Please see the history of present illness.   Otherwise, review of systems are positive for none.   All other systems are reviewed and negative.    PHYSICAL EXAM: VS:  BP (!) 175/81   Pulse 63   Temp 97.7 F (36.5 C)   Ht 5' (1.524 m)   Wt 102 lb 9.6 oz (46.5 kg)   SpO2 98%   BMI 20.04 kg/m  , BMI Body mass index is 20.04 kg/m. repeat BP 190/90. GENERAL:  Well appearing, elderly WF in NAD HEENT:  PERRL, EOMI, sclera are clear. Oropharynx is clear. NECK:  No jugular venous distention, carotid upstroke brisk and symmetric, no bruits, no thyromegaly or adenopathy LUNGS:  Clear to auscultation bilaterally CHEST:  Unremarkable HEART:  RRR,  PMI not displaced or sustained,S1 and S2 within normal limits, no S3, no S4: no clicks, no rubs, no murmurs ABD:  Soft, nontender. BS +, no masses or bruits. No hepatomegaly, no splenomegaly EXT:  2 + pulses throughout, no edema, no cyanosis no clubbing SKIN:  Warm and dry.  No rashes NEURO:  Alert and oriented x 3. Cranial nerves II through XII intact. PSYCH:  Cognitively intact   EKG:  EKG is not ordered today.   Recent Labs: 04/14/2018: ALT 15; BUN 12; Creatinine, Ser 0.74; Hemoglobin 11.5; Platelets 162; Potassium 3.7; Sodium 141    Lipid Panel    Component Value Date/Time   CHOL 189 02/08/2017 0343   TRIG 45 02/08/2017 0343   HDL 67 02/08/2017 0343   CHOLHDL 2.8 02/08/2017 0343    VLDL 9 02/08/2017 0343   LDLCALC 113 (H) 02/08/2017 0343    dated 12/13/17: cholesterol 204, triglycerides 84, HDL 62, LDL 125.   Wt Readings from Last 3 Encounters:  11/05/18 102 lb 9.6 oz (46.5 kg)  04/14/18 102 lb (46.3 kg)  12/19/17 106 lb (48.1 kg)      Other studies Reviewed:  -- 2D ECHO (05/08/14) EF 55-60%, no RWMA, G2DD, s/p biological Bentall procedure w/ total large replacement of a large arch aneurysm. Mean AV grad WNL for bioprosthesis. No AR. Mod MR thickening. L pleural effusion.    Echo 02/08/17: Study Conclusions  - Left ventricle: The cavity size was normal. Wall thickness  was   increased in a pattern of mild LVH. Systolic function was normal.   The estimated ejection fraction was in the range of 60% to 65%.   Left ventricular diastolic function parameters were normal. - Aortic valve: Aoritc sinuses appear somewhat dilated above AVR   Post Bental AV leaflets not well seen mild gradient Root not well   visualized no AR Valve area (VTI): 1.49 cm^2. Valve area (Vmax):   1.37 cm^2. Valve area (Vmean): 1.3 cm^2. - Mitral valve: Calcified annulus. Mildly thickened leaflets . - Atrial septum: No defect or patent foramen ovale was identified  ASSESSMENT AND PLAN:  1. HTN -BP is labile but control is not bad. She states readings better at home.  I offered to stop the Avapro but she wants to keep it just in case. She is going to try and space out doses so she doesn't have as many dips in BP.  2. 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 Continue ASA.  3. HLD- patient is not on statin. Refuses to take.   4. Mild LV dysfunction EF 45%- now resolved on last ECHO. EF now 60%.  5. Ascending aortic aneurysm s/p extensive Bental procedure with tissue AVR 2016 -- 2D ECHO in November 2018 with normal LVF with stable AVR and ascending aortic replacement  6.  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 Continue ASA.  7. Anxiety.    Current medicines are reviewed at length with the patient today.  The  patient does not have concerns regarding medicines.  The following changes have been made:  See above  Labs/ tests ordered today include: none     Disposition:   FU in 6 months.   Signed, Peter Martinique, MD  11/05/2018 4:15 PM    [facility]

## 2018-11-05 ENCOUNTER — Ambulatory Visit (INDEPENDENT_AMBULATORY_CARE_PROVIDER_SITE_OTHER): Payer: 59 | Admitting: Cardiology

## 2018-11-05 ENCOUNTER — Encounter: Payer: Self-pay | Admitting: Cardiology

## 2018-11-05 ENCOUNTER — Other Ambulatory Visit: Payer: Self-pay

## 2018-11-05 VITALS — BP 175/81 | HR 63 | Temp 97.7°F | Ht 60.0 in | Wt 102.6 lb

## 2018-11-05 DIAGNOSIS — I10 Essential (primary) hypertension: Secondary | ICD-10-CM | POA: Diagnosis not present

## 2018-11-05 DIAGNOSIS — I7121 Aneurysm of the ascending aorta, without rupture: Secondary | ICD-10-CM

## 2018-11-05 DIAGNOSIS — I712 Thoracic aortic aneurysm, without rupture: Secondary | ICD-10-CM | POA: Diagnosis not present

## 2018-11-05 DIAGNOSIS — Z952 Presence of prosthetic heart valve: Secondary | ICD-10-CM | POA: Diagnosis not present

## 2019-05-03 NOTE — Progress Notes (Signed)
Cardiology Office Note   Date:  05/07/2019   ID:  Yvonne Ballard, DOB [birth year], MRN [id number]  PCP:  Kelton Pillar, MD  Cardiologist:  Dr. Martinique   History of Present Illness: Yvonne Ballard is a 80 y.o. female with a hx of HTN, HLD, thoracic aortic aneurysm s/p Bental procedure (03/2014), and nonobst CAD who presents for follow up   She was initially seen in 03/2014. A 2D echo was done which showed a large thoracic aneurysm and the patient underwent heart catheterization showing mild nonobstructive CAD with mild LV dysfunction EF 45% with thoracic aneurysm and mildly elevated PA pressures. She underwent ascending and arch TAA repair with Bio-Bental procedure with tissue AVR and aorto-brachiocephalic bypass and aorto-left common carotid bypass on 03/31/2014.   She was seen in the ED in December 2017 with a right radial fracture following a fall. Managed by Ortho without surgery.  She was seen in the ED in July 2018 after a fall with pelvic fracture. This was treated conservatively.  She was admitted in November 2018 with left facial droop and dysarthria. CT acutely was negative. MRI significant for acute infarction involving the right posterior lentiform nucleus, mid corona radiata, and caudate body. She had evidence of moderate chronic microvascular changes. She was treated with lipitor and ASA.   She was seen in February 2020 in the ED with some dysarthria. MRI and CT of the head showed no acute changes with chronic microvascular disease.   On follow up today she is doing well from a cardiac standpoint. She denies any chest pain or SOB. . She is taking her 120 mg diltiazem in the am and if needed 30-60 mg in the pm. She is doing this most days.     Past Medical History:  Diagnosis Date  . Abnormal ECG   . Anxiety    pt. reports that she is nervous person  . Anxiety disorder   . Ascending aortic aneurysm [facility])    s/p repair with Bental procedure  . Coronary artery disease  03/2014   mild non obstructive CAD  . Dyslipidemia, goal LDL below 70 07/26/2014  . Family history of adverse reaction to anesthesia   . Heart murmur   . History of hiatal hernia   . Hypertension   . 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HCC)   . S/P AVR (aortic valve replacement)    tissue valve  . Weight loss     Past Surgical History:  Procedure Laterality Date  . BENTALL PROCEDURE N/A 03/31/2014   Procedure: BIO-BENTALL PROCEDURE using a 31mm Magna Ease Aortic Tissue Valve, a 26 mm Terumo Gelweave Valsalva Graft, a 14x10x26mm Terumo Gelweave Graft, and a 71mm straight Hemashield Platinum Graft.;  Surgeon: Ivin Poot, MD;  Location: [place];  Service: Open Heart Surgery;  Laterality: N/A;  . LEFT AND RIGHT HEART CATHETERIZATION WITH CORONARY ANGIOGRAM N/A 03/26/2014   Procedure: LEFT AND RIGHT HEART CATHETERIZATION WITH CORONARY ANGIOGRAM;  Surgeon: Peter M Martinique, MD;  Location: [facility] CATH LAB;  Service: Cardiovascular;  Laterality: N/A;  . TEE WITHOUT CARDIOVERSION N/A 03/31/2014   Procedure: TRANSESOPHAGEAL ECHOCARDIOGRAM (TEE);  Surgeon: Ivin Poot, MD;  Location: [place];  Service: Open Heart Surgery;  Laterality: N/A;  . TONSILLECTOMY    . TUBAL LIGATION       Current Outpatient Medications  Medication Sig Dispense Refill  . ALPRAZolam (NIRAVAM) 0.25 MG dissolvable tablet Take 0.25 mg by mouth daily as needed for anxiety.    Yvonne Ballard Kitchen aspirin 81  MG tablet Take 81 mg by mouth.     . diltiazem (CARDIZEM CD) 120 MG 24 hr capsule TAKE 1 CAPSULE EVERY MORNING 90 capsule 3  . diltiazem (CARDIZEM) 60 MG tablet Take 30 mg by mouth as needed.     Yvonne Ballard Kitchen ibuprofen (ADVIL,MOTRIN) 200 MG tablet Take 200 mg by mouth every 4 (four) hours as needed for headache or mild pain.     Yvonne Ballard Kitchen irbesartan (AVAPRO) 150 MG tablet TAKE ONE-HALF (1/2) TABLET TWICE A DAY (Patient taking differently: Take 75 mg by mouth 2 (two) times daily. ) 90 tablet 3  . Multiple Vitamins-Minerals (MULTIVITAMIN WITH MINERALS) tablet Take 1  tablet by mouth.     No current facility-administered medications for this visit.    Allergies:   Patient has no known allergies.    Social History:  The patient  reports that she has never smoked. She has never used smokeless tobacco. She reports current alcohol use. She reports that she does not use drugs.   Family History:  The patient's family history includes Cancer in her brother, father, and mother; Stroke in her father.    ROS:  Please see the history of present illness.   Otherwise, review of systems are positive for none.   All other systems are reviewed and negative.    PHYSICAL EXAM: VS:  BP (!) 164/71   Pulse 60   Temp 97.7 F (36.5 C)   Ht 5' (1.524 m)   Wt 105 lb (47.6 kg)   SpO2 98%   BMI 20.51 kg/m  , BMI Body mass index is 20.51 kg/m. repeat BP 190/90. GENERAL:  Well appearing, elderly WF in NAD HEENT:  PERRL, EOMI, sclera are clear. Oropharynx is clear. NECK:  No jugular venous distention, carotid upstroke brisk and symmetric, no bruits, no thyromegaly or adenopathy LUNGS:  Clear to auscultation bilaterally CHEST:  Unremarkable HEART:  RRR,  PMI not displaced or sustained,S1 and S2 within normal limits, no S3, no S4: no clicks, no rubs, gr 2/6 systolic murmur RUSB>LSB ABD:  Soft, nontender. BS +, no masses or bruits. No hepatomegaly, no splenomegaly EXT:  2 + pulses throughout, no edema, no cyanosis no clubbing SKIN:  Warm and dry.  No rashes NEURO:  Alert and oriented x 3. Cranial nerves II through XII intact. PSYCH:  Cognitively intact   EKG:  EKG is ordered today.NSR rate 60, RBBB. Lateral T wave inversion. No change. I have personally reviewed and interpreted this study.    Recent Labs: No results found for requested labs within last 8760 hours.    Lipid Panel    Component Value Date/Time   CHOL 189 02/08/2017 0343   TRIG 45 02/08/2017 0343   HDL 67 02/08/2017 0343   CHOLHDL 2.8 02/08/2017 0343   VLDL 9 02/08/2017 0343   LDLCALC 113 (H)  02/08/2017 0343   Dated 12/13/17: cholesterol 204, triglycerides 84, HDL 62, LDL 125.   Wt Readings from Last 3 Encounters:  05/07/19 105 lb (47.6 kg)  11/05/18 102 lb 9.6 oz (46.5 kg)  04/14/18 102 lb (46.3 kg)      Other studies Reviewed:  -- 2D ECHO (05/08/14) EF 55-60%, no RWMA, G2DD, s/p biological Bentall procedure w/ total large replacement of a large arch aneurysm. Mean AV grad WNL for bioprosthesis. No AR. Mod MR thickening. L pleural effusion.    Echo 02/08/17: Study Conclusions  - Left ventricle: The cavity size was normal. Wall thickness was   increased in a pattern of  mild LVH. Systolic function was normal.   The estimated ejection fraction was in the range of 60% to 65%.   Left ventricular diastolic function parameters were normal. - Aortic valve: Aoritc sinuses appear somewhat dilated above AVR   Post Bental AV leaflets not well seen mild gradient Root not well   visualized no AR Valve area (VTI): 1.49 cm^2. Valve area (Vmax):   1.37 cm^2. Valve area (Vmean): 1.3 cm^2. - Mitral valve: Calcified annulus. Mildly thickened leaflets . - Atrial septum: No defect or patent foramen ovale was identified  ASSESSMENT AND PLAN:  1. HTN -BP is labile but control is fair. I offered to increase her basal dose of diltiazem CD but she prefers to take the extra 30-60 mg of the smaller tablet like she is doing.    2. 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 Continue ASA.  3. HLD- patient is not on statin. Refuses to take.   4. Mild LV dysfunction EF 45%- now resolved on last ECHO. EF now 60%.  5. Ascending aortic aneurysm s/p extensive Bental procedure with tissue AVR 2016 -- 2D ECHO in November 2018 with normal LVF with stable AVR and ascending aortic replacement  6.  CVA 11/18. Continue ASA.  7. Anxiety.    Current medicines are reviewed at length with the patient today.  The patient does not have concerns regarding medicines.  The following changes have been made:  See  above  Labs/ tests ordered today include: none     Disposition:   FU in 6 months.   Signed, Peter Martinique, MD  05/07/2019 11:41 AM    [facility]

## 2019-05-07 ENCOUNTER — Other Ambulatory Visit: Payer: Self-pay

## 2019-05-07 ENCOUNTER — Ambulatory Visit (INDEPENDENT_AMBULATORY_CARE_PROVIDER_SITE_OTHER): Payer: 59 | Admitting: Cardiology

## 2019-05-07 ENCOUNTER — Encounter: Payer: Self-pay | Admitting: Cardiology

## 2019-05-07 VITALS — BP 164/71 | HR 60 | Temp 97.7°F | Ht 60.0 in | Wt 105.0 lb

## 2019-05-07 DIAGNOSIS — I7121 Aneurysm of the ascending aorta, without rupture: Secondary | ICD-10-CM

## 2019-05-07 DIAGNOSIS — Z952 Presence of prosthetic heart valve: Secondary | ICD-10-CM

## 2019-05-07 DIAGNOSIS — I712 Thoracic aortic aneurysm, without rupture: Secondary | ICD-10-CM | POA: Diagnosis not present

## 2019-05-07 DIAGNOSIS — I10 Essential (primary) hypertension: Secondary | ICD-10-CM

## 2019-05-07 DIAGNOSIS — I2510 Atherosclerotic heart disease of native coronary artery without angina pectoris: Secondary | ICD-10-CM

## 2019-05-07 MED ORDER — DILTIAZEM HCL ER COATED BEADS 120 MG PO CP24: 120.0000 mg | ORAL_CAPSULE | Freq: Every morning | ORAL | 3 refills | Status: DC

## 2019-05-07 MED ORDER — IRBESARTAN 150 MG PO TABS: ORAL_TABLET | ORAL | 3 refills | Status: DC

## 2019-05-07 MED ORDER — DILTIAZEM HCL 60 MG PO TABS: 30.0000 mg | ORAL_TABLET | ORAL | 3 refills | Status: DC | PRN

## 2019-10-31 NOTE — Progress Notes (Signed)
Cardiology Office Note   Date:  11/04/2019   ID:  Yvonne Ballard, DOB 1940-07-27, MRN [id number]  PCP:  Kelton Pillar, MD  Cardiologist:  Dr. Martinique   History of Present Illness: Yvonne Ballard is a 79 y.o. female with a hx of HTN, HLD, thoracic aortic aneurysm s/p Bental procedure (03/2014), and nonobst CAD who presents for follow up   She was initially seen in 03/2014. A 2D echo was done which showed a large thoracic aneurysm and the patient underwent heart catheterization showing mild nonobstructive CAD with mild LV dysfunction EF 45% with thoracic aneurysm and mildly elevated PA pressures. She underwent ascending and arch TAA repair with Bio-Bental procedure with tissue AVR and aorto-brachiocephalic bypass and aorto-left common carotid bypass on 03/31/2014.   She was admitted in November 2018 with left facial droop and dysarthria. CT acutely was negative. MRI significant for acute infarction involving the right posterior lentiform nucleus, mid corona radiata, and caudate body. She had evidence of moderate chronic microvascular changes. She was treated with lipitor and ASA.   She was seen in February 2020 in the ED with some dysarthria. MRI and CT of the head showed no acute changes with chronic microvascular disease.   On follow up today she is doing well from a cardiac standpoint. She denies any chest pain or SOB. . She is taking her 120 mg diltiazem in the am and if needed 30-60 mg in the pm. She is not taking Avapro. Her BP diary at home shows BP consistently 409-811 systolic with some readings of 170. States she is always nervous.    Past Medical History:  Diagnosis Date  . Abnormal ECG   . Anxiety    pt. reports that she is nervous person  . Anxiety disorder   . Ascending aortic aneurysm [facility])    s/p repair with Bental procedure  . Coronary artery disease 03/2014   mild non obstructive CAD  . Dyslipidemia, goal LDL below 70 07/26/2014  . Family history of adverse reaction  to anesthesia   . Heart murmur   . History of hiatal hernia   . Hypertension   . 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HCC)   . S/P AVR (aortic valve replacement)    tissue valve  . Weight loss     Past Surgical History:  Procedure Laterality Date  . BENTALL PROCEDURE N/A 03/31/2014   Procedure: BIO-BENTALL PROCEDURE using a 48mm Magna Ease Aortic Tissue Valve, a 26 mm Terumo Gelweave Valsalva Graft, a 14x10x42mm Terumo Gelweave Graft, and a 34mm straight Hemashield Platinum Graft.;  Surgeon: Ivin Poot, MD;  Location: [place];  Service: Open Heart Surgery;  Laterality: N/A;  . LEFT AND RIGHT HEART CATHETERIZATION WITH CORONARY ANGIOGRAM N/A 03/26/2014   Procedure: LEFT AND RIGHT HEART CATHETERIZATION WITH CORONARY ANGIOGRAM;  Surgeon: Latarra Eagleton M Martinique, MD;  Location: [facility] CATH LAB;  Service: Cardiovascular;  Laterality: N/A;  . TEE WITHOUT CARDIOVERSION N/A 03/31/2014   Procedure: TRANSESOPHAGEAL ECHOCARDIOGRAM (TEE);  Surgeon: Ivin Poot, MD;  Location: [place];  Service: Open Heart Surgery;  Laterality: N/A;  . TONSILLECTOMY    . TUBAL LIGATION       Current Outpatient Medications  Medication Sig Dispense Refill  . ALPRAZolam (NIRAVAM) 0.25 MG dissolvable tablet Take 0.25 mg by mouth daily as needed for anxiety.    Marland Kitchen amoxicillin (AMOXIL) 500 MG tablet SMARTSIG:4 Tablet(s) By Mouth    . diltiazem (CARDIZEM CD) 120 MG 24 hr capsule Take 1 capsule (120 mg total)  by mouth every morning. 90 capsule 3  . diltiazem (CARDIZEM) 60 MG tablet Take 0.5 tablets (30 mg total) by mouth as needed. 90 tablet 3  . ibuprofen (ADVIL,MOTRIN) 200 MG tablet Take 200 mg by mouth every 4 (four) hours as needed for headache or mild pain.     Marland Kitchen irbesartan (AVAPRO) 150 MG tablet TAKE ONE-HALF (1/2) TABLET TWICE A DAY 90 tablet 3  . Multiple Vitamins-Minerals (MULTIVITAMIN WITH MINERALS) tablet Take 1 tablet by mouth as needed.      No current facility-administered medications for this visit.    Allergies:    Patient has no known allergies.    Social History:  The patient  reports that she has never smoked. She has never used smokeless tobacco. She reports current alcohol use. She reports that she does not use drugs.   Family History:  The patient's family history includes Cancer in her brother, father, and mother; Stroke in her father.    ROS:  Please see the history of present illness.   Otherwise, review of systems are positive for none.   All other systems are reviewed and negative.    PHYSICAL EXAM: VS:  BP 130/90   Pulse 63   Ht 5' (1.524 m)   Wt 103 lb 9.6 oz (47 kg)   SpO2 97%   BMI 20.23 kg/m  , BMI Body mass index is 20.23 kg/m. repeat BP 190/90. GENERAL:  Well appearing, elderly WF in NAD HEENT:  PERRL, EOMI, sclera are clear. Oropharynx is clear. NECK:  No jugular venous distention, carotid upstroke brisk and symmetric, no bruits, no thyromegaly or adenopathy LUNGS:  Clear to auscultation bilaterally CHEST:  Unremarkable HEART:  RRR,  PMI not displaced or sustained,S1 and S2 within normal limits, no S3, no S4: no clicks, no rubs, gr 2/6 systolic murmur RUSB>LSB ABD:  Soft, nontender. BS +, no masses or bruits. No hepatomegaly, no splenomegaly EXT:  2 + pulses throughout, no edema, no cyanosis no clubbing SKIN:  Warm and dry.  No rashes NEURO:  Alert and oriented x 3. Cranial nerves II through XII intact. PSYCH:  Cognitively intact   EKG:  EKG is not today.   Recent Labs: No results found for requested labs within last 8760 hours.    Lipid Panel    Component Value Date/Time   CHOL 189 02/08/2017 0343   TRIG 45 02/08/2017 0343   HDL 67 02/08/2017 0343   CHOLHDL 2.8 02/08/2017 0343   VLDL 9 02/08/2017 0343   LDLCALC 113 (H) 02/08/2017 0343   Dated 12/13/17: cholesterol 204, triglycerides 84, HDL 62, LDL 125.   Wt Readings from Last 3 Encounters:  11/04/19 103 lb 9.6 oz (47 kg)  05/07/19 105 lb (47.6 kg)  11/05/18 102 lb 9.6 oz (46.5 kg)      Other studies  Reviewed:  -- 2D ECHO (05/08/14) EF 55-60%, no RWMA, G2DD, s/p biological Bentall procedure w/ total large replacement of a large arch aneurysm. Mean AV grad WNL for bioprosthesis. No AR. Mod MR thickening. L pleural effusion.    Echo 02/08/17: Study Conclusions  - Left ventricle: The cavity size was normal. Wall thickness was   increased in a pattern of mild LVH. Systolic function was normal.   The estimated ejection fraction was in the range of 60% to 65%.   Left ventricular diastolic function parameters were normal. - Aortic valve: Aoritc sinuses appear somewhat dilated above AVR   Post Bental AV leaflets not well seen mild gradient  Root not well   visualized no AR Valve area (VTI): 1.49 cm^2. Valve area (Vmax):   1.37 cm^2. Valve area (Vmean): 1.3 cm^2. - Mitral valve: Calcified annulus. Mildly thickened leaflets . - Atrial septum: No defect or patent foramen ovale was identified  ASSESSMENT AND PLAN:  1. HTN -BP is labile and control is not optimal.  I offered to increase her basal dose of diltiazem CD but she prefers to take the extra 30-60 mg of the smaller tablet like she is doing.  I recommend she take the Avapro as prescribed.   2. 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 Continue ASA.  3. HLD- patient is not on statin. Refuses to take.   4. Mild LV dysfunction EF 45%-  resolved on last ECHO. EF now 60%.  5. Ascending aortic aneurysm s/p extensive Bental procedure with tissue AVR 2016 -- 2D ECHO in November 2018 with normal LVF with stable AVR and ascending aortic replacement  6.  CVA 11/18. Continue ASA.  7. Anxiety.    Current medicines are reviewed at length with the patient today.  The patient does not have concerns regarding medicines.  The following changes have been made:  See above  Labs/ tests ordered today include: CMET, CBC, lipids.    Disposition:   FU in one year.    Signed, Daekwon Beswick Martinique, MD  11/04/2019 3:38 PM    [facility]

## 2019-11-04 ENCOUNTER — Encounter: Payer: Self-pay | Admitting: Cardiology

## 2019-11-04 ENCOUNTER — Ambulatory Visit (INDEPENDENT_AMBULATORY_CARE_PROVIDER_SITE_OTHER): Payer: 59 | Admitting: Cardiology

## 2019-11-04 ENCOUNTER — Other Ambulatory Visit: Payer: Self-pay

## 2019-11-04 VITALS — BP 130/90 | HR 63 | Ht 60.0 in | Wt 103.6 lb

## 2019-11-04 DIAGNOSIS — I7121 Aneurysm of the ascending aorta, without rupture: Secondary | ICD-10-CM

## 2019-11-04 DIAGNOSIS — I10 Essential (primary) hypertension: Secondary | ICD-10-CM

## 2019-11-04 DIAGNOSIS — I712 Thoracic aortic aneurysm, without rupture: Secondary | ICD-10-CM

## 2019-11-04 DIAGNOSIS — Z952 Presence of prosthetic heart valve: Secondary | ICD-10-CM | POA: Diagnosis not present

## 2019-11-05 LAB — HEPATIC FUNCTION PANEL
ALT: 11 IU/L (ref 0–32)
AST: 18 IU/L (ref 0–40)
Albumin: 4.4 g/dL (ref 3.7–4.7)
Alkaline Phosphatase: 81 IU/L (ref 48–121)
Bilirubin Total: <0.2 mg/dL (ref 0.0–1.2)
Bilirubin, Direct: 0.05 mg/dL (ref 0.00–0.40)
Total Protein: 6.4 g/dL (ref 6.0–8.5)

## 2019-11-05 LAB — LIPID PANEL
Chol/HDL Ratio: 3 ratio (ref 0.0–4.4)
Cholesterol, Total: 215 mg/dL — ABNORMAL HIGH (ref 100–199)
HDL: 72 mg/dL (ref >39)
LDL Chol Calc (NIH): 123 mg/dL — ABNORMAL HIGH (ref 0–99)
Triglycerides: 114 mg/dL (ref 0–149)
VLDL Cholesterol Cal: 20 mg/dL (ref 5–40)

## 2019-11-05 LAB — CBC WITH DIFFERENTIAL/PLATELET
Basophils Absolute: 0 10*3/uL (ref 0.0–0.2)
Basos: 1 %
EOS (ABSOLUTE): 0.1 10*3/uL (ref 0.0–0.4)
Eos: 1 %
Hematocrit: 36.8 % (ref 34.0–46.6)
Hemoglobin: 11.5 g/dL (ref 11.1–15.9)
Immature Grans (Abs): 0 10*3/uL (ref 0.0–0.1)
Immature Granulocytes: 0 %
Lymphocytes Absolute: 2.1 10*3/uL (ref 0.7–3.1)
Lymphs: 38 %
MCH: 28.9 pg (ref 26.6–33.0)
MCHC: 31.3 g/dL — ABNORMAL LOW (ref 31.5–35.7)
MCV: 93 fL (ref 79–97)
Monocytes Absolute: 0.4 10*3/uL (ref 0.1–0.9)
Monocytes: 7 %
Neutrophils Absolute: 3 10*3/uL (ref 1.4–7.0)
Neutrophils: 53 %
Platelets: 179 10*3/uL (ref 150–450)
RBC: 3.98 x10E6/uL (ref 3.77–5.28)
RDW: 13.2 % (ref 11.7–15.4)
WBC: 5.5 10*3/uL (ref 3.4–10.8)

## 2019-11-05 LAB — BASIC METABOLIC PANEL
BUN/Creatinine Ratio: 28 (ref 12–28)
BUN: 19 mg/dL (ref 8–27)
CO2: 25 mmol/L (ref 20–29)
Calcium: 9.1 mg/dL (ref 8.7–10.3)
Chloride: 102 mmol/L (ref 96–106)
Creatinine, Ser: 0.67 mg/dL (ref 0.57–1.00)
GFR calc Af Amer: 96 mL/min/{1.73_m2} (ref >59)
GFR calc non Af Amer: 83 mL/min/{1.73_m2} (ref >59)
Glucose: 95 mg/dL (ref 65–99)
Potassium: 4 mmol/L (ref 3.5–5.2)
Sodium: 140 mmol/L (ref 134–144)

## 2019-11-15 ENCOUNTER — Encounter (HOSPITAL_COMMUNITY): Payer: Self-pay | Admitting: *Deleted

## 2019-11-15 ENCOUNTER — Other Ambulatory Visit: Payer: Self-pay

## 2019-11-15 ENCOUNTER — Inpatient Hospital Stay (HOSPITAL_COMMUNITY): Payer: Medicare Other

## 2019-11-15 ENCOUNTER — Emergency Department (HOSPITAL_COMMUNITY): Payer: Medicare Other

## 2019-11-15 ENCOUNTER — Inpatient Hospital Stay (HOSPITAL_COMMUNITY)
Admission: EM | Admit: 2019-11-15 | Discharge: 2019-11-16 | DRG: 123 | Disposition: A | Discharge status: Home Health | Payer: Medicare Other | Attending: Family Medicine | Admitting: Family Medicine

## 2019-11-15 DIAGNOSIS — Z79899 Other long term (current) drug therapy: Secondary | ICD-10-CM | POA: Diagnosis not present

## 2019-11-15 DIAGNOSIS — I10 Essential (primary) hypertension: Secondary | ICD-10-CM

## 2019-11-15 DIAGNOSIS — Z953 Presence of xenogenic heart valve: Secondary | ICD-10-CM | POA: Diagnosis not present

## 2019-11-15 DIAGNOSIS — I361 Nonrheumatic tricuspid (valve) insufficiency: Secondary | ICD-10-CM | POA: Diagnosis not present

## 2019-11-15 DIAGNOSIS — M353 Polymyalgia rheumatica: Secondary | ICD-10-CM | POA: Diagnosis present

## 2019-11-15 DIAGNOSIS — Z8673 Personal history of transient ischemic attack (TIA), and cerebral infarction without residual deficits: Secondary | ICD-10-CM

## 2019-11-15 DIAGNOSIS — H3411 Central retinal artery occlusion, right eye: Principal | ICD-10-CM | POA: Diagnosis present

## 2019-11-15 DIAGNOSIS — H40053 Ocular hypertension, bilateral: Secondary | ICD-10-CM | POA: Diagnosis present

## 2019-11-15 DIAGNOSIS — Z952 Presence of prosthetic heart valve: Secondary | ICD-10-CM | POA: Diagnosis not present

## 2019-11-15 DIAGNOSIS — I639 Cerebral infarction, unspecified: Secondary | ICD-10-CM

## 2019-11-15 DIAGNOSIS — I2510 Atherosclerotic heart disease of native coronary artery without angina pectoris: Secondary | ICD-10-CM | POA: Diagnosis present

## 2019-11-15 DIAGNOSIS — Z20822 Contact with and (suspected) exposure to covid-19: Secondary | ICD-10-CM | POA: Diagnosis present

## 2019-11-15 DIAGNOSIS — H5461 Unqualified visual loss, right eye, normal vision left eye: Secondary | ICD-10-CM

## 2019-11-15 DIAGNOSIS — E785 Hyperlipidemia, unspecified: Secondary | ICD-10-CM | POA: Diagnosis present

## 2019-11-15 DIAGNOSIS — I350 Nonrheumatic aortic (valve) stenosis: Secondary | ICD-10-CM | POA: Diagnosis not present

## 2019-11-15 LAB — DIFFERENTIAL
Abs Immature Granulocytes: 0.01 10*3/uL (ref 0.00–0.07)
Basophils Absolute: 0 10*3/uL (ref 0.0–0.1)
Basophils Relative: 1 %
Eosinophils Absolute: 0 10*3/uL (ref 0.0–0.5)
Eosinophils Relative: 1 %
Immature Granulocytes: 0 %
Lymphocytes Relative: 30 %
Lymphs Abs: 1.7 10*3/uL (ref 0.7–4.0)
Monocytes Absolute: 0.4 10*3/uL (ref 0.1–1.0)
Monocytes Relative: 7 %
Neutro Abs: 3.3 10*3/uL (ref 1.7–7.7)
Neutrophils Relative %: 61 %

## 2019-11-15 LAB — COMPREHENSIVE METABOLIC PANEL
ALT: 14 U/L (ref 0–44)
AST: 20 U/L (ref 15–41)
Albumin: 3.7 g/dL (ref 3.5–5.0)
Alkaline Phosphatase: 68 U/L (ref 38–126)
Anion gap: 9 (ref 5–15)
BUN: 9 mg/dL (ref 8–23)
CO2: 26 mmol/L (ref 22–32)
Calcium: 8.5 mg/dL — ABNORMAL LOW (ref 8.9–10.3)
Chloride: 101 mmol/L (ref 98–111)
Creatinine, Ser: 0.61 mg/dL (ref 0.44–1.00)
GFR calc Af Amer: >60 mL/min (ref >60)
GFR calc non Af Amer: >60 mL/min (ref >60)
Glucose, Bld: 127 mg/dL — ABNORMAL HIGH (ref 70–99)
Potassium: 3.4 mmol/L — ABNORMAL LOW (ref 3.5–5.1)
Sodium: 136 mmol/L (ref 135–145)
Total Bilirubin: 0.8 mg/dL (ref 0.3–1.2)
Total Protein: 5.9 g/dL — ABNORMAL LOW (ref 6.5–8.1)

## 2019-11-15 LAB — CBC
HCT: 36.9 % (ref 36.0–46.0)
Hemoglobin: 11.5 g/dL — ABNORMAL LOW (ref 12.0–15.0)
MCH: 29 pg (ref 26.0–34.0)
MCHC: 31.2 g/dL (ref 30.0–36.0)
MCV: 93.2 fL (ref 80.0–100.0)
Platelets: 154 10*3/uL (ref 150–400)
RBC: 3.96 MIL/uL (ref 3.87–5.11)
RDW: 13.3 % (ref 11.5–15.5)
WBC: 5.5 10*3/uL (ref 4.0–10.5)
nRBC: 0 % (ref 0.0–0.2)

## 2019-11-15 LAB — SEDIMENTATION RATE: Sed Rate: 7 mm/hr (ref 0–22)

## 2019-11-15 LAB — HEMOGLOBIN A1C
Hgb A1c MFr Bld: 5.4 % (ref 4.8–5.6)
Mean Plasma Glucose: 108.28 mg/dL

## 2019-11-15 LAB — SARS CORONAVIRUS 2 BY RT PCR (HOSPITAL ORDER, PERFORMED IN ~~LOC~~ HOSPITAL LAB): SARS Coronavirus 2: NEGATIVE

## 2019-11-15 LAB — PROTIME-INR
INR: 1 (ref 0.8–1.2)
Prothrombin Time: 12.5 seconds (ref 11.4–15.2)

## 2019-11-15 LAB — C-REACTIVE PROTEIN: CRP: 0.9 mg/dL (ref <1.0)

## 2019-11-15 LAB — APTT: aPTT: 31 seconds (ref 24–36)

## 2019-11-15 MED ORDER — DORZOLAMIDE HCL-TIMOLOL MAL 2-0.5 % OP SOLN
1.0000 [drp] | Freq: Two times a day (BID) | OPHTHALMIC | Status: DC
  Administered 2019-11-15 – 2019-11-16 (×2): 1 [drp] via OPHTHALMIC
  Filled 2019-11-15: qty 10

## 2019-11-15 MED ORDER — ACETAMINOPHEN 650 MG RE SUPP: 650.0000 mg | RECTAL | Status: DC | PRN

## 2019-11-15 MED ORDER — ASPIRIN 300 MG RE SUPP: 300.0000 mg | Freq: Every day | RECTAL | Status: DC

## 2019-11-15 MED ORDER — ACETAMINOPHEN 160 MG/5ML PO SOLN: 650.0000 mg | ORAL | Status: DC | PRN

## 2019-11-15 MED ORDER — ASPIRIN 300 MG RE SUPP
300.0000 mg | Freq: Every day | RECTAL | Status: DC
  Filled 2019-11-15: qty 1

## 2019-11-15 MED ORDER — SODIUM CHLORIDE 0.9% FLUSH
3.0000 mL | Freq: Once | INTRAVENOUS | Status: AC
  Administered 2019-11-15: 3 mL via INTRAVENOUS

## 2019-11-15 MED ORDER — ATORVASTATIN CALCIUM 80 MG PO TABS: 80.0000 mg | ORAL_TABLET | Freq: Every day | ORAL | Status: DC

## 2019-11-15 MED ORDER — FLUORESCEIN SODIUM 1 MG OP STRP
1.0000 | ORAL_STRIP | Freq: Once | OPHTHALMIC | Status: DC
  Filled 2019-11-15: qty 1

## 2019-11-15 MED ORDER — ENOXAPARIN SODIUM 40 MG/0.4ML ~~LOC~~ SOLN
40.0000 mg | SUBCUTANEOUS | Status: DC
  Administered 2019-11-15: 40 mg via SUBCUTANEOUS
  Filled 2019-11-15: qty 0.4

## 2019-11-15 MED ORDER — GADOBUTROL 1 MMOL/ML IV SOLN
4.0000 mL | Freq: Once | INTRAVENOUS | Status: AC | PRN
  Administered 2019-11-15: 4 mL via INTRAVENOUS

## 2019-11-15 MED ORDER — HYDRALAZINE HCL 20 MG/ML IJ SOLN
10.0000 mg | INTRAMUSCULAR | Status: DC | PRN
  Administered 2019-11-16: 10 mg via INTRAVENOUS
  Filled 2019-11-15: qty 1

## 2019-11-15 MED ORDER — ASPIRIN 81 MG PO CHEW
81.0000 mg | CHEWABLE_TABLET | Freq: Every day | ORAL | Status: DC
  Administered 2019-11-15: 81 mg via ORAL
  Filled 2019-11-15: qty 1

## 2019-11-15 MED ORDER — STROKE: EARLY STAGES OF RECOVERY BOOK
Freq: Once | Status: DC
  Filled 2019-11-15: qty 1

## 2019-11-15 MED ORDER — LORAZEPAM 2 MG/ML IJ SOLN
1.0000 mg | INTRAMUSCULAR | Status: DC | PRN
  Administered 2019-11-16: 1 mg via INTRAVENOUS
  Filled 2019-11-15: qty 1

## 2019-11-15 MED ORDER — ACETAMINOPHEN 325 MG PO TABS: 650.0000 mg | ORAL_TABLET | ORAL | Status: DC | PRN

## 2019-11-15 MED ORDER — TETRACAINE HCL 0.5 % OP SOLN
2.0000 [drp] | Freq: Once | OPHTHALMIC | Status: DC
  Filled 2019-11-15: qty 16

## 2019-11-15 MED ORDER — ASPIRIN 81 MG PO CHEW
81.0000 mg | CHEWABLE_TABLET | Freq: Every day | ORAL | Status: DC
  Administered 2019-11-15: 81 mg via ORAL

## 2019-11-15 NOTE — Progress Notes (Signed)
CC:  Chief Complaint  Patient presents with  . Loss of Vision    HPI: Yvonne Ballard is a 79 y.o. female w/ POH of cataracts OU and ?glaucoma (not put on drops) and PMH below who presents for evaluation of sudden painless vision loss OD. Symptoms started at 3 AM this morning, vision has slightly improved since onset. No jaw claudication, no scalp tenderness, no headache. Does have history of PMR, and has been on Prednisone in the past. No history of GCA per patient.   ROS: Denies fever/chills, unintentional weight loss, chest pain, irregular heart rhythm, SOB, cough, wheezing, abdominal pain, melena, hematochezia, weakness, numbness, slurring of speech, facial droop, muscle weakness, joint pain, skin rash, tattoos, depressed mood  PMH: Past Medical History:  Diagnosis Date  . Abnormal ECG   . Anxiety    pt. reports that she is nervous person  . Anxiety disorder   . Ascending aortic aneurysm [facility])    s/p repair with Bental procedure  . Coronary artery disease 03/2014   mild non obstructive CAD  . Dyslipidemia, goal LDL below 70 07/26/2014  . Family history of adverse reaction to anesthesia   . Heart murmur   . History of hiatal hernia   . Hypertension   . 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HCC)   . S/P AVR (aortic valve replacement)    tissue valve  . Weight loss     PSH: Past Surgical History:  Procedure Laterality Date  . BENTALL PROCEDURE N/A 03/31/2014   Procedure: BIO-BENTALL PROCEDURE using a 74m Magna Ease Aortic Tissue Valve, a 26 mm Terumo Gelweave Valsalva Graft, a 14x10x172mTerumo Gelweave Graft, and a 2838mtraight Hemashield Platinum Graft.;  Surgeon: PetIvin PootD;  [facility];  Laterality: N/A;  . LEFT AND RIGHT HEART CATHETERIZATION WITH CORONARY ANGIOGRAM N/A 03/26/2014   Procedure: LEFT AND RIGHT HEART CATHETERIZATION WITH CORONARY ANGIOGRAM;  Surgeon: Peter M JorMartiniqueD;  [facility];  Service: Cardiovascular;   Laterality: N/A;  . TEE WITHOUT CARDIOVERSION N/A 03/31/2014   Procedure: TRANSESOPHAGEAL ECHOCARDIOGRAM (TEE);  Surgeon: PetIvin PootD;  [facility];  Laterality: N/A;  . TONSILLECTOMY    . TUBAL LIGATION      Meds: No current facility-administered medications on file prior to encounter.   Current Outpatient Medications on File Prior to Encounter  Medication Sig Dispense Refill  . ALPRAZolam (NIRAVAM) 0.25 MG dissolvable tablet Take 0.25 mg by mouth daily as needed for anxiety.    . aMarland Kitchenoxicillin (AMOXIL) 500 MG tablet SMARTSIG:4 Tablet(s) By Mouth    . diltiazem (CARDIZEM CD) 120 MG 24 hr capsule Take 1 capsule (120 mg total) by mouth every morning. 90 capsule 3  . diltiazem (CARDIZEM) 60 MG tablet Take 0.5 tablets (30 mg total) by mouth as needed. 90 tablet 3  . ibuprofen (ADVIL,MOTRIN) 200 MG tablet Take 200 mg by mouth every 4 (four) hours as needed for headache or mild pain.     . iMarland Kitchenbesartan (AVAPRO) 150 MG tablet TAKE ONE-HALF (1/2) TABLET TWICE A DAY 90 tablet 3  . Multiple Vitamins-Minerals (MULTIVITAMIN WITH MINERALS) tablet Take 1 tablet by mouth as needed.       SH: Social History   Socioeconomic History  . Marital status: Widowed    Spouse name: Not on file  . Number of children: 2  . Years of education: Not on file  . Highest education level: Not on file  Occupational History  . Not on file  Tobacco Use  . Smoking status: Never Smoker  . Smokeless tobacco: Never Used  Vaping Use  . Vaping Use: Never used  Substance and Sexual Activity  . Alcohol use: Yes    Comment: " I like wine", - irregular consumption - socially has wine    . Drug use: No  . Sexual activity: Not on file  Other Topics Concern  . Not on file  Social History Narrative  . Not on file   Social Determinants of Health   Financial Resource Strain:   . Difficulty of Paying Living Expenses: Not on file  Food Insecurity:   . Worried About Sales executive in the Last Year: Not on file  . Ran Out of Food in the Last Year: Not on file  Transportation Needs:   . Lack of Transportation (Medical): Not on file  . Lack of Transportation (Non-Medical): Not on file  Physical Activity:   . Days of Exercise per Week: Not on file  . Minutes of Exercise per Session: Not on file  Stress:   . Feeling of Stress : Not on file  Social Connections:   . Frequency of Communication with Friends and Family: Not on file  . Frequency of Social Gatherings with Friends and Family: Not on file  . Attends Religious Services: Not on file  . Active Member of Clubs or Organizations: Not on file  . Attends Archivist Meetings: Not on file  . Marital Status: Not on file    FH: Family History  Problem Relation Age of Onset  . Cancer Mother   . Cancer Father   . Stroke Father   . Cancer Brother     Exam:  Lucianne Lei: OD: 20/400 eccentric OS: 5pt (+1.50)  CVF: OD: Constricted with small window temporally OS: full  EOM: OD: full d/v OS: full d/v  Pupils: OD: 4-2.5, + APD OS: 4>2 mm, no APD  IOP: by [place] OD:20 OS: 22  External: OD: no periorbital edema, no proptosis, V1-V3 intact and symmetric, good orbicularis strength OS: no periorbital edema, no proptosis, V1-V3 intact and symmetric, good orbicularis strength  Pen Light Exam: L/L: OD: WNL OS: WNL  C/S: OD: Trace injection OS: white and quiet  K: OD: clear, no abnormal staining OS: clear, no abnormal staining  A/C: OD: grossly deep and quiet appearing by pen light OS: grossly deep and quiet appearing by pen light  I: OD: round and regular OS: round and regular  L: OD: 3+NSC OS: 2+ NSC  DFE: dilated @ 3:25 PM w/ Tropic and Phenyl OU  V: OD: clear OS: clear  N: OD: C/D 0.45, no disc edema OS: C/D 0.45, no disc edema  M: OD: Cherry-red spot OS: flat, no obvious macular pathology  V: OD: No embolus seen OS: normal appearing vessels  P: OD: retina  flat 360, no obvious mass/RT/RD OS: retina flat 360, no obvious mass/RT/RD  A/P:  1. Central Retinal Artery Occlusion OD, last onset of normal vision was previous evening (woke up with vision loss at 3 AM) - Does have history of PMR - Needs ESR/CRP - MRI Brain w/ and w/o contrast, echo, carotid US and admit for stroke - Will add Cosopt BID OU for ocular hypertension and to try to mobilize occlusion more distally  - Discussed likely poor visual outcome and no established treatments for this  2. Ocular Hypertension - ? History of glaucoma per patient  -  Given history of PMR, if ESR/CRP are elevated, would recommend IV solumedrol and vascular surgery consultation for temporal artery biopsy. Please call if this comes back elevated.   Marshall Cork, MD,[facility]

## 2019-11-15 NOTE — ED Provider Notes (Signed)
[facility] EMERGENCY DEPARTMENT Provider Note   CSN: [id number] Arrival date & time: 11/15/19  1055     History Chief Complaint  Patient presents with  . Loss of Vision    Yvonne Ballard is a 79 y.o. female.  The history is provided by the patient and medical records. No language interpreter was used.     79 year old female significant history, heart murmur, hypertension, polymyalgia rheumatica, prior stroke presenting for evaluation of acute vision loss.  Patient report last night she went to bed around 10 PM without any symptoms.  She woke up at 3 AM to use the bathroom when she realized she could not see out of her right eye.  She attempt to pry her eyelid open without any improvement.  Her vision loss has been persistent since.  She report perhaps a very slight recognitions of movement but difficult to even detect light or dark.  She denies any associated headache, confusion, focal numbness or weakness, neck pain, chest pain, heart palpitation.  She denies fever or chills no nausea vomiting diarrhea.  She is currently not on any blood thinner medication.  She is up-to-date with her Covid vaccination.  She came to be via taxi.  She lives at home by herself.  She has history of PMR however denies history of temporal arteritis.  She report that at baseline her eyesight is good, she is reading glasses only.  She does not wear contact lenses or prescription glasses.  Past Medical History:  Diagnosis Date  . Abnormal ECG   . Anxiety    pt. reports that she is nervous person  . Anxiety disorder   . Ascending aortic aneurysm [facility])    s/p repair with Bental procedure  . Coronary artery disease 03/2014   mild non obstructive CAD  . Dyslipidemia, goal LDL below 70 07/26/2014  . Family history of adverse reaction to anesthesia   . Heart murmur   . History of hiatal hernia   . Hypertension   . 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HCC)   . S/P AVR (aortic valve replacement)     tissue valve  . Weight loss     Patient Active Problem List   Diagnosis Date Noted  . Weight loss   . 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HCC)   . Hypertension   . History of hiatal hernia   . Family history of adverse reaction to anesthesia   . Ascending aortic aneurysm ([place])   . Anxiety disorder   . Abnormal ECG   . Anxiety 02/07/2017  . Hiatal hernia 02/07/2017  . Stroke (cerebrum) ([place]) 02/07/2017  . CAD (coronary artery disease), native coronary artery 07/26/2014  . Dyslipidemia, goal LDL below 70 07/26/2014  . S/P AVR (aortic valve replacement)   . 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 Benign essential HTN 03/18/2014  . LVH (left ventricular hypertrophy) due to hypertensive disease 03/18/2014  . Heart murmur 03/18/2014  . Coronary artery disease 03/14/2014    Past Surgical History:  Procedure Laterality Date  . BENTALL PROCEDURE N/A 03/31/2014   Procedure: BIO-BENTALL PROCEDURE using a 89m Magna Ease Aortic Tissue Valve, a 26 mm Terumo Gelweave Valsalva Graft, a 14x10x132mTerumo Gelweave Graft, and a 2829mtraight Hemashield Platinum Graft.;  Surgeon: PetIvin PootD;  [facility];  Laterality: N/A;  . LEFT AND RIGHT HEART CATHETERIZATION WITH CORONARY ANGIOGRAM N/A 03/26/2014   Procedure: LEFT AND RIGHT HEART CATHETERIZATION WITH CORONARY ANGIOGRAM;  Surgeon: PetAnder Slade  Martin[place], MD;  Location: [facility] CATH LAB;  Service: Cardiovascular;  Laterality: N/A;  . TEE WITHOUT CARDIOVERSION N/A 03/31/2014   Procedure: TRANSESOPHAGEAL ECHOCARDIOGRAM (TEE);  Surgeon: Ivin Poot, MD;  Location: [facility];  Service: Open Heart Surgery;  Laterality: N/A;  . TONSILLECTOMY    . TUBAL LIGATION       OB History   No obstetric history on file.     Family History  Problem Relation Age of Onset  . Cancer Mother   . Cancer Father   . Stroke Father   . Cancer Brother     Social History   Tobacco Use  . Smoking status: Never Smoker  . Smokeless  tobacco: Never Used  Vaping Use  . Vaping Use: Never used  Substance Use Topics  . Alcohol use: Yes    Comment: " I like wine", - irregular consumption - socially has wine    . Drug use: No    Home Medications Prior to Admission medications   Medication Sig Start Date End Date Taking? Authorizing Provider  ALPRAZolam (NIRAVAM) 0.25 MG dissolvable tablet Take 0.25 mg by mouth daily as needed for anxiety.    [provider]  amoxicillin (AMOXIL) 500 MG tablet SMARTSIG:4 Tablet(s) By Mouth 10/16/19   [provider]  diltiazem (CARDIZEM CD) 120 MG 24 hr capsule Take 1 capsule (120 mg total) by mouth every morning. 05/07/19   Martinique, Peter M, MD  diltiazem (CARDIZEM) 60 MG tablet Take 0.5 tablets (30 mg total) by mouth as needed. 05/07/19   Martinique, Peter M, MD  ibuprofen (ADVIL,MOTRIN) 200 MG tablet Take 200 mg by mouth every 4 (four) hours as needed for headache or mild pain.     [provider]  irbesartan (AVAPRO) 150 MG tablet TAKE ONE-HALF (1/2) TABLET TWICE A DAY 05/07/19   Martinique, Peter M, MD  Multiple Vitamins-Minerals (MULTIVITAMIN WITH MINERALS) tablet Take 1 tablet by mouth as needed.     [provider]    Allergies    Patient has no known allergies.  Review of Systems   Review of Systems  All other systems reviewed and are negative.   Physical Exam Updated Vital Signs BP (!) 194/74 (BP Location: Right Arm)   Pulse 60   Temp 98.8 F (37.1 C) (Oral)   Resp 16   SpO2 100%   Physical Exam Vitals and nursing note reviewed.  Constitutional:      General: She is not in acute distress.    Appearance: She is well-developed.  HENT:     Head: Atraumatic.  Eyes:     General: Lids are normal. Lids are everted, no foreign bodies appreciated. Visual field deficit present.        Right eye: No foreign body.        Left eye: No foreign body.     Extraocular Movements:     Right eye: No nystagmus.     Left eye: No nystagmus.      Conjunctiva/sclera: Conjunctivae normal.     Right eye: Right conjunctiva is not injected.     Left eye: Left conjunctiva is not injected.     Pupils:     Right eye: Pupil is not reactive.     Left eye: Pupil is reactive.     Comments: Cherry red spot in fundoscopic exam of R eye  Cardiovascular:     Rate and Rhythm: Normal rate and regular rhythm.     Heart sounds: Murmur heard.  Pulmonary:     Effort: Pulmonary effort is normal.     Breath sounds: Normal breath sounds.  Abdominal:     Palpations: Abdomen is soft.     Tenderness: There is no abdominal tenderness.  Musculoskeletal:     Cervical back: Neck supple.  Skin:    Findings: No rash.  Neurological:     Mental Status: She is alert and oriented to person, place, and time.     Comments: Neurologic exam:  Speech clear, R pupil non reactive, L pupil 2+ and reactive, extraocular movements intact  R vision loss, L eye with normal vision Cranial nerves III through XII normal including no facial droop Follows commands, moves all extremities x4, normal strength to bilateral upper and lower extremities at all major muscle groups including grip Sensation normal to light touch  No pronator drift       ED Results / Procedures / Treatments   Labs (all labs ordered are listed, but only abnormal results are displayed) Labs Reviewed  CBC - Abnormal; Notable for the following components:      Result Value   Hemoglobin 11.5 (*)    All other components within normal limits  COMPREHENSIVE METABOLIC PANEL - Abnormal; Notable for the following components:   Potassium 3.4 (*)    Glucose, Bld 127 (*)    Calcium 8.5 (*)    Total Protein 5.9 (*)    All other components within normal limits  SARS CORONAVIRUS 2 BY RT PCR (HOSPITAL ORDER, [facility])  PROTIME-INR  APTT  DIFFERENTIAL  SEDIMENTATION RATE  C-REACTIVE PROTEIN  HEMOGLOBIN A1C  LDL CHOLESTEROL, DIRECT    EKG EKG  Interpretation  Date/Time:  Friday November 15 2019 11:07:17 EDT Ventricular Rate:  60 PR Interval:  222 QRS Duration: 132 QT Interval:  472 QTC Calculation: 472 R Axis:   -3 Text Interpretation: Sinus rhythm with 1st degree A-V block with Premature supraventricular complexes Right bundle branch block T wave abnormality, consider lateral ischemia Abnormal ECG similar to Feb 2020 Confirmed by Sherwood Gambler [phone]) on 11/15/2019 12:11:02 PM   Radiology MR ANGIO HEAD WO CONTRAST  Result Date: 11/15/2019 CLINICAL DATA:  Sudden onset painless vision loss. EXAM: MRI HEAD WITHOUT AND WITH CONTRAST MRA HEAD WITHOUT CONTRAST TECHNIQUE: Multiplanar, multiecho pulse sequences of the brain and surrounding structures were obtained without and with intravenous contrast. Angiographic images of the head were obtained using MRA technique without contrast. CONTRAST:  105m GADAVIST GADOBUTROL 1 MMOL/ML IV SOLN COMPARISON:  Head MRI 04/14/2018 and MRA 02/07/2017 FINDINGS: MRI HEAD FINDINGS Brain: No acute infarct, mass, midline shift, or extra-axial fluid collection is identified. Scattered chronic cerebral microhemorrhages have at most minimally increased in number from the prior MRI. Patchy T2 hyperintensities in the cerebral white matter bilaterally are unchanged and nonspecific but compatible with moderate to severe chronic small vessel ischemic disease. Small chronic infarcts are again seen in the right corona, bilateral basal ganglia, and right pons. Mild cerebral atrophy is within normal limits for age. No abnormal enhancement is identified. Vascular: Major intracranial vascular flow voids are preserved. Skull and upper cervical spine: Unremarkable bone marrow signal. Sinuses/Orbits: Unremarkable orbits. Paranasal sinuses and mastoid air cells are clear. Other: None. MRA HEAD FINDINGS The study is mildly motion degraded. The visualized distal vertebral arteries are patent to the basilar with the right being  strongly dominant. The basilar artery is widely patent. There are right larger than left posterior communicating arteries. Both PCAs are patent with  branch vessel irregularity and attenuation but no evidence of a flow limiting proximal stenosis. The internal carotid arteries are patent from skull base to carotid termini without evidence of a significant stenosis on the right. There is a moderate left paraclinoid ICA stenosis which is stable to mildly progressive with assessment mildly limited by motion on today's study. A 2 mm superiorly projecting left paraophthalmic ICA aneurysm is questioned. ACAs and MCAs are patent without evidence of a proximal branch occlusion or a significant A1 or M1 stenosis. There is motion artifact predominantly through the left MCA branch vessels with a questionable moderate left M2 inferior division stenosis. IMPRESSION: 1. No acute intracranial abnormality. 2. Moderate to severe chronic small vessel ischemic disease. 3. No large or medium vessel arterial occlusion. 4. Moderate left paraclinoid ICA stenosis. 5. 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 Electronically Signed   By: Logan Bores M.D.   On: 11/15/2019 20:49   MR Brain W and Wo Contrast  Result Date: 11/15/2019 CLINICAL DATA:  Sudden onset painless vision loss. EXAM: MRI HEAD WITHOUT AND WITH CONTRAST MRA HEAD WITHOUT CONTRAST TECHNIQUE: Multiplanar, multiecho pulse sequences of the brain and surrounding structures were obtained without and with intravenous contrast. Angiographic images of the head were obtained using MRA technique without contrast. CONTRAST:  4m GADAVIST GADOBUTROL 1 MMOL/ML IV SOLN COMPARISON:  Head MRI 04/14/2018 and MRA 02/07/2017 FINDINGS: MRI HEAD FINDINGS Brain: No acute infarct, mass, midline shift, or extra-axial fluid collection is identified. Scattered chronic cerebral microhemorrhages have at most minimally increased in number from the prior MRI. Patchy T2 hyperintensities in the  cerebral white matter bilaterally are unchanged and nonspecific but compatible with moderate to severe chronic small vessel ischemic disease. Small chronic infarcts are again seen in the right corona, bilateral basal ganglia, and right pons. Mild cerebral atrophy is within normal limits for age. No abnormal enhancement is identified. Vascular: Major intracranial vascular flow voids are preserved. Skull and upper cervical spine: Unremarkable bone marrow signal. Sinuses/Orbits: Unremarkable orbits. Paranasal sinuses and mastoid air cells are clear. Other: None. MRA HEAD FINDINGS The study is mildly motion degraded. The visualized distal vertebral arteries are patent to the basilar with the right being strongly dominant. The basilar artery is widely patent. There are right larger than left posterior communicating arteries. Both PCAs are patent with branch vessel irregularity and attenuation but no evidence of a flow limiting proximal stenosis. The internal carotid arteries are patent from skull base to carotid termini without evidence of a significant stenosis on the right. There is a moderate left paraclinoid ICA stenosis which is stable to mildly progressive with assessment mildly limited by motion on today's study. A 2 mm superiorly projecting left paraophthalmic ICA aneurysm is questioned. ACAs and MCAs are patent without evidence of a proximal branch occlusion or a significant A1 or M1 stenosis. There is motion artifact predominantly through the left MCA branch vessels with a questionable moderate left M2 inferior division stenosis. IMPRESSION: 1. No acute intracranial abnormality. 2. Moderate to severe chronic small vessel ischemic disease. 3. No large or medium vessel arterial occlusion. 4. Moderate left paraclinoid ICA stenosis. 5. 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 Electronically Signed   By: ALogan BoresM.D.   On: 11/15/2019 20:49    Procedures .Critical Care Performed by: TDomenic Moras  PA-C Authorized by: TDomenic Moras PA-C   Critical care provider statement:    Critical care time (minutes):  50   Critical care was time spent personally by me  on the following activities:  Discussions with consultants, evaluation of patient's response to treatment, examination of patient, ordering and performing treatments and interventions, ordering and review of laboratory studies, ordering and review of radiographic studies, pulse oximetry, re-evaluation of patient's condition, obtaining history from patient or surrogate and review of old charts   (including critical care time)  Medications Ordered in ED Medications  sodium chloride flush (NS) 0.9 % injection 3 mL (has no administration in time range)  tetracaine (PONTOCAINE) 0.5 % ophthalmic solution 2 drop (has no administration in time range)  fluorescein ophthalmic strip 1 strip (has no administration in time range)  LORazepam (ATIVAN) injection 1 mg (has no administration in time range)  dorzolamide-timolol (COSOPT) 22.3-6.8 MG/ML ophthalmic solution 1 drop (has no administration in time range)  aspirin chewable tablet 81 mg (has no administration in time range)    Or  aspirin suppository 300 mg (has no administration in time range)  atorvastatin (LIPITOR) tablet 80 mg (has no administration in time range)    ED Course  I have reviewed the triage vital signs and the nursing notes.  Pertinent labs & imaging results that were available during my care of the patient were reviewed by me and considered in my medical decision making (see chart for details).    MDM Rules/Calculators/A&P                          BP (!) 194/74 (BP Location: Right Arm)   Pulse 60   Temp 98.8 F (37.1 C) (Oral)   Resp 16   SpO2 100%   Final Clinical Impression(s) / ED Diagnoses Final diagnoses:  Vision loss of right eye    Rx / DC Orders ED Discharge Orders    None     Patient presents with painless monocular vision loss involving the  right eye.  Last known normal 10 PM last night.  No associated headache, no focal neuro deficit aside from focal acute vision loss.  She does have history of PMR but no history of temporal arteritis.  I have consulted with neurology who recommend ophthalmology evaluation.  Appreciate consultation from ophthalmologist, Dr. Kathlen Mody, who agrees to see patient in the ER to help with further work-up.  He also request for ESR and CRP.    Patient has an elevated blood pressure of 194/74.  She denies active headache.  Care discussed with Dr. Verner Chol who performed bedside ocular ultrasound without any obvious finding to suggest retinal detachment.  Currently concern for central retinal artery occlusion.  4:02 PM Dr. Kathlen Mody have evaluated pt and voice strong concerns for CRAO.  He does recommend admission for stroke work up.  But also recommend check on normal., if positive then start pt on 1g of solumedrol and consult vascular for temporal artery biopsy and assess for GCA.  Otherwise admission for stroke work up.    4:17 PM Appreciate consultation from neurology who agrees to be involve in pt care and request medicine admission for stroke work up.   4:48 PM I have consulted [facility] hospitalist, and spoke with Dr. Dan Maker. At this time she request for MRI to result and if patient does have evidence of stroke then she can be consulted for admission. Will await for MRI results.  9:01 PM Sed rate and CRP are normal.  Low suspicion for giant cell arteritis.  Brain and MRA MRI and MRA obtained show no acute intracranial abnormalities.  Moderate to severe chronic small vessel  ischemic disease but no large or medium vessel arterial occlusion.  Mild left paraclinoid ICA stenosis and possible 2 mm left paraophthalmic ICA aneurysm.  I have discussed this finding with on-call neurologist, Dr. Leonel Ramsay, who agrees with admission for stroke work-up as this is concerning for a potential embolic stroke.  Patient is made  aware of plan.  9:30 PM Appreciate consultation from [facility]ist Dr. Alcario Drought who agrees to see and admit pt for stroke work up.   Jezel Basto was evaluated in Emergency Department on 11/15/2019 for the symptoms described in the history of present illness. She was evaluated in the context of the global COVID-19 pandemic, which necessitated consideration that the patient might be at risk for infection with the SARS-CoV-2 virus that causes COVID-19. Institutional protocols and algorithms that pertain to the evaluation of patients at risk for COVID-19 are in a state of rapid change based on information released by regulatory bodies including the CDC and federal and state organizations. These policies and algorithms were followed during the patient's care in the ED.    Domenic Moras, PA-C 11/15/19 2131    Sherwood Gambler, MD 11/18/19 [phone]

## 2019-11-15 NOTE — ED Notes (Addendum)
This RN notified MD on pt HTN crisis   0009 Orders receive for PRN Hydralazine

## 2019-11-15 NOTE — ED Triage Notes (Signed)
Pt reports being at baseline when she went to bed last night. Pt woke up at 3am and noticed loss of vision to right eye. No other deficits are noted. Grips are strong, no facial droop, no arm drift, speech is clear.

## 2019-11-15 NOTE — ED Provider Notes (Signed)
Ultrasound ED Ocular  Date/Time: 11/15/2019 12:39 PM Performed by: Sherwood Gambler, MD Authorized by: Sherwood Gambler, MD   PROCEDURE DETAILS:    Indications: visual change     Assessed:  Right eye   Images: archived     Limitations:  None RIGHT EYE FINDINGS:     no foreign body noted in right eye    right eye lens not dislodged    no evidence of retinal detachment of the right eye    no ruptured globe in right eye Comments:     No obvious abnormalities on bedside ocular ultrasound      Sherwood Gambler, MD 11/15/19 1240

## 2019-11-15 NOTE — Consult Note (Signed)
NEUROLOGY CONSULTATION NOTE   Date of service: November 15, 2019 Patient Name: Yvonne Ballard MRN:  [id number] DOB:  03/27/1940 Reason for consult: "CRAO R visual loss"  History of Present Illness  Yvonne Ballard is a 79 y.o. female with PMH significant for HTN, PMR, HLD, s/p AVR who presents with sudden onset of painless vision loss. She went to bed at 10pm, woke up at [facility] and could not see our of her R eye. She did not think too much of it. Her eye was not hurting. She woke up in the morning with no improvement in her vision.  No prior history of stroke, she takes aspirin 325 mg daily at home. Her dad had a stroke. She does not smoke, drinks alcohol on rare social occasions only.   ROS   Constitutional Denies weight loss, fever and chills.  HEENT Denies changes in vision and hearing.  Respiratory Denies SOB and cough.  CV Denies palpitations and CP  GI Denies abdominal pain, nausea, vomiting and diarrhea.  GU Denies dysuria and urinary frequency.  MSK Denies myalgia and joint pain.  Skin Denies rash and pruritus.  Neurological Denies headache and syncope.  Psychiatric Denies recent changes in mood. Denies anxiety and depression.   Past History   Past Medical History:  Diagnosis Date   Abnormal ECG    Anxiety    pt. reports that she is nervous person   Anxiety disorder    Ascending aortic aneurysm [facility])    s/p repair with Bental procedure   Coronary artery disease 03/2014   mild non obstructive CAD   Dyslipidemia, goal LDL below 70 07/26/2014   Family history of adverse reaction to anesthesia    Heart murmur    History of hiatal hernia    Hypertension    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HCC)    S/P AVR (aortic valve replacement)    tissue valve   Weight loss    Past Surgical History:  Procedure Laterality Date   BENTALL PROCEDURE N/A 03/31/2014   Procedure: BIO-BENTALL PROCEDURE using a 29mm Magna Ease Aortic Tissue Valve, a 26 mm Terumo Gelweave Valsalva  Graft, a 14x10x36mm Terumo Gelweave Graft, and a 15mm straight Hemashield Platinum Graft.;  Surgeon: Ivin Poot, MD;  Location: [place];  Service: Open Heart Surgery;  Laterality: N/A;   LEFT AND RIGHT HEART CATHETERIZATION WITH CORONARY ANGIOGRAM N/A 03/26/2014   Procedure: LEFT AND RIGHT HEART CATHETERIZATION WITH CORONARY ANGIOGRAM;  Surgeon: Peter M Martinique, MD;  Location: [facility] CATH LAB;  Service: Cardiovascular;  Laterality: N/A;   TEE WITHOUT CARDIOVERSION N/A 03/31/2014   Procedure: TRANSESOPHAGEAL ECHOCARDIOGRAM (TEE);  Surgeon: Ivin Poot, MD;  Location: [place];  Service: Open Heart Surgery;  Laterality: N/A;   TONSILLECTOMY     TUBAL LIGATION     Family History  Problem Relation Age of Onset   Cancer Mother    Cancer Father    Stroke Father    Cancer Brother    Social History   Socioeconomic History   Marital status: Widowed    Spouse name: Not on file   Number of children: 2   Years of education: Not on file   Highest education level: Not on file  Occupational History   Not on file  Tobacco Use   Smoking status: Never Smoker   Smokeless tobacco: Never Used  Vaping Use   Vaping Use: Never used  Substance and Sexual Activity   Alcohol use: Yes    Comment: " I like  wine", - irregular consumption - socially has wine     Drug use: No   Sexual activity: Not on file  Other Topics Concern   Not on file  Social History Narrative   Not on file   Social Determinants of Health   Financial Resource Strain:    Difficulty of Paying Living Expenses: Not on file  Food Insecurity:    Worried About [place] in the Last Year: Not on file   Ran Out of Food in the Last Year: Not on file  Transportation Needs:    Lack of Transportation (Medical): Not on file   Lack of Transportation (Non-Medical): Not on file  Physical Activity:    Days of Exercise per Week: Not on file   Minutes of Exercise per Session: Not on file  Stress:     Feeling of Stress : Not on file  Social Connections:    Frequency of Communication with Friends and Family: Not on file   Frequency of Social Gatherings with Friends and Family: Not on file   Attends Religious Services: Not on file   Active Member of Clubs or Organizations: Not on file   Attends Archivist Meetings: Not on file   Marital Status: Not on file   No Known Allergies  Medications  (Not in a hospital admission)    Vitals  Temp:  [98.8 F (37.1 C)] 98.8 F (37.1 C) (09/03 1059) Pulse Rate:  [60] 60 (09/03 1059) Resp:  [16] 16 (09/03 1059) BP: (194)/(74) 194/74 (09/03 1059) SpO2:  [100 %] 100 % (09/03 1059)  There is no height or weight on file to calculate BMI.  Physical Exam   General: Laying comfortably in bed; in no acute distress.  HENT: Normal oropharynx and mucosa. Normal external appearance of ears and nose. Neck: Supple, no pain or tenderness CV: No JVD. No peripheral edema. Pulmonary: Symmetric Chest rise. Normal respiratory effort. Abdomen: Soft to touch, non-tender Ext: No cyanosis, edema, or deformity  Skin: No rash. Normal palpation of skin.   Musculoskeletal: Normal digits and nails by inspection. No clubbing.  Neurologic Examination  Mental status/Cognition: Alert, oriented to self, place, month and year, good attention. Speech/language: Fluent, comprehension intact, object naming intact, repetition intact. Cranial nerves:   CN II BL pupils Dilated to 61mm with no response to light(optho dilated them). R eye monoccular vision loss.   CN III,IV,VI EOM intact, no gaze preference or deviation, no nystagmus   CN V normal sensation in V1, V2, and V3 segments bilaterally   CN VII no asymmetry, no nasolabial fold flattening   CN VIII normal hearing to speech   CN IX & X normal palatal elevation, no uvular deviation   CN XI 5/5 head turn and 5/5 shoulder shrug bilaterally   CN XII midline tongue protrusion    Motor:  Muscle bulk:  normal, tone normal, pronator drift none Mvmt Root Nerve  Muscle Right Left Comments  SA C5/6 Ax Deltoid 5 5   EF C5/6 Mc Biceps 5 5   EE C6/7/8 Rad Triceps 5 5   WF C6/7 Med FCR 5 5   WE C7/8 PIN ECU 5 5   F Ab C8/T1 U ADM/FDI 5 5   HF L1/2/3 Fem Illopsoas 5 5   KE L2/3/4 Fem Quad 5 5   DF L4/5 D Peron Tib Ant 5 5   PF S1/2 Tibial Grc/Sol 5 5    Reflexes:  Right Left Comments  Pectoralis  Biceps (C5/6) 1 1   Brachioradialis (C5/6) 1 1    Triceps (C6/7) 0 0    Patellar (L3/4) 1 1    Achilles (S1) 0 0    Hoffman      Plantar     Jaw jerk    Sensation:  Light touch Intact   Pin prick    Temperature    Vibration   Proprioception    Coordination/Complex Motor:  - Finger to Nose intact BL - Heel to shin intact BL - Rapid alternating movement normal   Labs   Lab Results  Component Value Date   NA 136 11/15/2019   K 3.4 (L) 11/15/2019   CL 101 11/15/2019   CO2 26 11/15/2019   GLUCOSE 127 (H) 11/15/2019   BUN 9 11/15/2019   CREATININE 0.61 11/15/2019   CALCIUM 8.5 (L) 11/15/2019   ALBUMIN 3.7 11/15/2019   AST 20 11/15/2019   ALT 14 11/15/2019   ALKPHOS 68 11/15/2019   BILITOT 0.8 11/15/2019   GFRNONAA >60 11/15/2019   GFRAA >60 11/15/2019     Imaging and Diagnostic studies  MRI Brain without contrast from Feb 2020: 1. No acute intracranial abnormality. 2. 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     Impression   Maleigha Colvard is a 79 y.o. female with PMH significant for HTN, PMR, HLD, s/p AVR who presents with sudden onset of painless vision loss. Her neurologic examination is notable for R eye vision loss. Ophthalmology concerned about CRAO, want to rule out temporal arteritis with hx of PMR.  Recommendations   - Brain imaging- MRI Brain pending - Vascular imaging- MRA Head and Carotid doppler. - TTE - pending - Lipid panel - ordered  - Statin - ordered Atorvastatin 80mg   daily - A1C - ordered  - Antithrombotic - She took aspirin 325mg  today in AM. Aspirin 81mg  daily ordered from tomorrow. - DVT ppx - sq Heparin. - SBP goal - permissive hypertension first 24 h < 220/110. Held home meds.  - Telemetry monitoring for arrythmia - Swallow screen - Stroke education - PT/OT/SLP consults  ______________________________________________________________________   Thank you for the opportunity to take part in the care of this patient. If you have any further questions, please contact the neurology consultation attending.  Signed,  [place] Pager Number [phone]

## 2019-11-15 NOTE — ED Notes (Signed)
Patient transported to X-ray 

## 2019-11-15 NOTE — H&P (Signed)
History and Physical    Yvonne Ballard BVQ:[id number] DOB: [birth year] DOA: 11/15/2019  PCP: Kelton Pillar, MD  Patient coming from: Home  I have personally briefly reviewed patient's old medical records in [place]  Chief Complaint: Vision loss R eye  HPI: Yvonne Ballard is a 79 y.o. female with medical history significant of ascending aortic aneurysm repair and AVR with bioprosthetic valve, PMR, HTN, HLD.  Pt presents to the ED with c/o vision loss in R eye.  LKW when she went to bed last night at 10pm, woke up at 3am with vision loss, thought nothing of it, went back to bed, vision loss was still present when she woke up this morning and has persisted throughout day.  Came to ER at 11 am.   ED Course: Optho saw pt, concerned for CRAO, neurology also saw pt.  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  ESR of 7 and CRP of 0.9.  Hospitalist asked to admit.   Review of Systems: As per HPI, otherwise all review of systems negative.  Past Medical History:  Diagnosis Date  . Abnormal ECG   . Anxiety    pt. reports that she is nervous person  . Anxiety disorder   . Ascending aortic aneurysm ([facility]    s/p repair with Bental procedure  . Coronary artery disease 03/2014   mild non obstructive CAD  . Dyslipidemia, goal LDL below 70 07/26/2014  . Family history of adverse reaction to anesthesia   . Heart murmur   . History of hiatal hernia   . Hypertension   . 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HCC)   . S/P AVR (aortic valve replacement)    tissue valve  . Weight loss     Past Surgical History:  Procedure Laterality Date  . BENTALL PROCEDURE N/A 03/31/2014   Procedure: BIO-BENTALL PROCEDURE using a 239mMagna Ease Aortic Tissue Valve, a 26 mm Terumo Gelweave Valsalva Graft, a 14x10x1067merumo Gelweave Graft, and a 17m60mraight Hemashield Platinum Graft.;  Surgeon: PeteIvin Poot;  [facility];  Laterality: N/A;   . LEFT AND RIGHT HEART CATHETERIZATION WITH CORONARY ANGIOGRAM N/A 03/26/2014   Procedure: LEFT AND RIGHT HEART CATHETERIZATION WITH CORONARY ANGIOGRAM;  Surgeon: Peter M JordMartinique;  [facility];  Service: Cardiovascular;  Laterality: N/A;  . TEE WITHOUT CARDIOVERSION N/A 03/31/2014   Procedure: TRANSESOPHAGEAL ECHOCARDIOGRAM (TEE);  Surgeon: PeteIvin Poot;  [facility];  Laterality: N/A;  . TONSILLECTOMY    . TUBAL LIGATION       reports that she has never smoked. She has never used smokeless tobacco. She reports current alcohol use. She reports that she does not use drugs.  No Known Allergies  Family History  Problem Relation Age of Onset  . Cancer Mother   . Cancer Father   . Stroke Father   . Cancer Brother      Prior to Admission medications   Medication Sig Start Date End Date Taking? Authorizing Provider  ALPRAZolam (XANAX) 0.25 MG tablet Take 0.125 mg by mouth daily as needed for anxiety.   Yes [provider]  amoxicillin (AMOXIL) 500 MG tablet Take 2,000 mg by mouth See admin instructions. Take four tablets (2000 mg) by mouth one hour before dental appointments 10/16/19  Yes [provider]  diltiazem (CARDIZEM CD) 120 MG 24 hr capsule Take 1 capsule (120 mg total) by mouth  every morning. 05/07/19  Yes Martinique, Peter M, MD  diltiazem (CARDIZEM) 60 MG tablet Take 0.5 tablets (30 mg total) by mouth as needed. Patient taking differently: Take 30 mg by mouth daily as needed (SBP >150).  05/07/19  Yes Martinique, Peter M, MD  irbesartan (AVAPRO) 150 MG tablet TAKE ONE-HALF (1/2) TABLET TWICE A DAY Patient taking differently: Take 75 mg by mouth 2 (two) times daily. TAKE ONE-HALF (1/2) TABLET TWICE A DAY 05/07/19  Yes Martinique, Peter M, MD  Multiple Vitamins-Minerals (MULTIVITAMIN WITH MINERALS) tablet Take 1 tablet by mouth every 14 (fourteen) days.    Yes [provider]    Physical Exam: Vitals:   11/15/19  1059 11/15/19 1700 11/15/19 1742 11/15/19 1745  BP: (!) 194/74 (!) 157/131  (!) 177/140  Pulse: 60 (!) 58 65 71  Resp: 16 13 15    Temp: 98.8 F (37.1 C)     TempSrc: Oral     SpO2: 100% 96% 98% 99%    Constitutional: NAD, calm, comfortable Eyes: PERRL, lids and conjunctivae normal ENMT: Mucous membranes are moist. Posterior pharynx clear of any exudate or lesions.Normal dentition.  Neck: normal, supple, no masses, no thyromegaly Respiratory: clear to auscultation bilaterally, no wheezing, no crackles. Normal respiratory effort. No accessory muscle use.  Cardiovascular: Regular rate and rhythm, no murmurs / rubs / gallops. No extremity edema. 2+ pedal pulses. No carotid bruits.  Abdomen: no tenderness, no masses palpated. No hepatosplenomegaly. Bowel sounds positive.  Musculoskeletal: no clubbing / cyanosis. No joint deformity upper and lower extremities. Good ROM, no contractures. Normal muscle tone.  Skin: no rashes, lesions, ulcers. No induration Neurologic: CN 2-12 grossly intact. Sensation intact, DTR normal. Strength 5/5 in all 4.  Psychiatric: Normal judgment and insight. Alert and oriented x 3. Normal mood.    Labs on Admission: I have personally reviewed following labs and imaging studies  CBC: Recent Labs  Lab 11/15/19 1123  WBC 5.5  NEUTROABS 3.3  HGB 11.5*  HCT 36.9  MCV 93.2  PLT 829   Basic Metabolic Panel: Recent Labs  Lab 11/15/19 1123  NA 136  K 3.4*  CL 101  CO2 26  GLUCOSE 127*  BUN 9  CREATININE 0.61  CALCIUM 8.5*   GFR: Estimated Creatinine Clearance: 40.3 mL/min (by C-G formula based on SCr of 0.61 mg/dL). Liver Function Tests: Recent Labs  Lab 11/15/19 1123  AST 20  ALT 14  ALKPHOS 68  BILITOT 0.8  PROT 5.9*  ALBUMIN 3.7   No results for input(s): LIPASE, AMYLASE in the last 168 hours. No results for input(s): AMMONIA in the last 168 hours. Coagulation Profile: Recent Labs  Lab 11/15/19 1123  INR 1.0   Cardiac Enzymes: No  results for input(s): CKTOTAL, CKMB, CKMBINDEX, TROPONINI in the last 168 hours. BNP (last 3 results) No results for input(s): PROBNP in the last 8760 hours. HbA1C: Recent Labs    11/15/19 1735  HGBA1C 5.4   CBG: No results for input(s): GLUCAP in the last 168 hours. Lipid Profile: No results for input(s): CHOL, HDL, LDLCALC, TRIG, CHOLHDL, LDLDIRECT in the last 72 hours. Thyroid Function Tests: No results for input(s): TSH, T4TOTAL, FREET4, T3FREE, THYROIDAB in the last 72 hours. Anemia Panel: No results for input(s): VITAMINB12, FOLATE, FERRITIN, TIBC, IRON, RETICCTPCT in the last 72 hours. Urine analysis:    Component Value Date/Time   COLORURINE STRAW (A) 04/14/2018 1717   APPEARANCEUR CLEAR 04/14/2018 1717   LABSPEC 1.006 04/14/2018 1717   PHURINE 7.0 04/14/2018  [place] 04/14/2018 1717   HGBUR SMALL (A) 04/14/2018 1717   BILIRUBINUR NEGATIVE 04/14/2018 1717   KETONESUR NEGATIVE 04/14/2018 1717   PROTEINUR NEGATIVE 04/14/2018 1717   UROBILINOGEN 0.2 04/19/2014 0610   NITRITE NEGATIVE 04/14/2018 1717   LEUKOCYTESUR SMALL (A) 04/14/2018 1717    Radiological Exams on Admission: MR ANGIO HEAD WO CONTRAST  Result Date: 11/15/2019 CLINICAL DATA:  Sudden onset painless vision loss. EXAM: MRI HEAD WITHOUT AND WITH CONTRAST MRA HEAD WITHOUT CONTRAST TECHNIQUE: Multiplanar, multiecho pulse sequences of the brain and surrounding structures were obtained without and with intravenous contrast. Angiographic images of the head were obtained using MRA technique without contrast. CONTRAST:  55m GADAVIST GADOBUTROL 1 MMOL/ML IV SOLN COMPARISON:  Head MRI 04/14/2018 and MRA 02/07/2017 FINDINGS: MRI HEAD FINDINGS Brain: No acute infarct, mass, midline shift, or extra-axial fluid collection is identified. Scattered chronic cerebral microhemorrhages have at most minimally increased in number from the prior MRI. Patchy T2 hyperintensities in the cerebral white matter bilaterally are  unchanged and nonspecific but compatible with moderate to severe chronic small vessel ischemic disease. Small chronic infarcts are again seen in the right corona, bilateral basal ganglia, and right pons. Mild cerebral atrophy is within normal limits for age. No abnormal enhancement is identified. Vascular: Major intracranial vascular flow voids are preserved. Skull and upper cervical spine: Unremarkable bone marrow signal. Sinuses/Orbits: Unremarkable orbits. Paranasal sinuses and mastoid air cells are clear. Other: None. MRA HEAD FINDINGS The study is mildly motion degraded. The visualized distal vertebral arteries are patent to the basilar with the right being strongly dominant. The basilar artery is widely patent. There are right larger than left posterior communicating arteries. Both PCAs are patent with branch vessel irregularity and attenuation but no evidence of a flow limiting proximal stenosis. The internal carotid arteries are patent from skull base to carotid termini without evidence of a significant stenosis on the right. There is a moderate left paraclinoid ICA stenosis which is stable to mildly progressive with assessment mildly limited by motion on today's study. A 2 mm superiorly projecting left paraophthalmic ICA aneurysm is questioned. ACAs and MCAs are patent without evidence of a proximal branch occlusion or a significant A1 or M1 stenosis. There is motion artifact predominantly through the left MCA branch vessels with a questionable moderate left M2 inferior division stenosis. IMPRESSION: 1. No acute intracranial abnormality. 2. Moderate to severe chronic small vessel ischemic disease. 3. No large or medium vessel arterial occlusion. 4. Moderate left paraclinoid ICA stenosis. 5. 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 Electronically Signed   By: ALogan BoresM.D.   On: 11/15/2019 20:49   MR Brain W and Wo Contrast  Result Date: 11/15/2019 CLINICAL DATA:  Sudden onset painless vision  loss. EXAM: MRI HEAD WITHOUT AND WITH CONTRAST MRA HEAD WITHOUT CONTRAST TECHNIQUE: Multiplanar, multiecho pulse sequences of the brain and surrounding structures were obtained without and with intravenous contrast. Angiographic images of the head were obtained using MRA technique without contrast. CONTRAST:  459mGADAVIST GADOBUTROL 1 MMOL/ML IV SOLN COMPARISON:  Head MRI 04/14/2018 and MRA 02/07/2017 FINDINGS: MRI HEAD FINDINGS Brain: No acute infarct, mass, midline shift, or extra-axial fluid collection is identified. Scattered chronic cerebral microhemorrhages have at most minimally increased in number from the prior MRI. Patchy T2 hyperintensities in the cerebral white matter bilaterally are unchanged and nonspecific but compatible with moderate to severe chronic small vessel ischemic disease. Small chronic infarcts are again seen in the right corona, bilateral  basal ganglia, and right pons. Mild cerebral atrophy is within normal limits for age. No abnormal enhancement is identified. Vascular: Major intracranial vascular flow voids are preserved. Skull and upper cervical spine: Unremarkable bone marrow signal. Sinuses/Orbits: Unremarkable orbits. Paranasal sinuses and mastoid air cells are clear. Other: None. MRA HEAD FINDINGS The study is mildly motion degraded. The visualized distal vertebral arteries are patent to the basilar with the right being strongly dominant. The basilar artery is widely patent. There are right larger than left posterior communicating arteries. Both PCAs are patent with branch vessel irregularity and attenuation but no evidence of a flow limiting proximal stenosis. The internal carotid arteries are patent from skull base to carotid termini without evidence of a significant stenosis on the right. There is a moderate left paraclinoid ICA stenosis which is stable to mildly progressive with assessment mildly limited by motion on today's study. A 2 mm superiorly projecting left  paraophthalmic ICA aneurysm is questioned. ACAs and MCAs are patent without evidence of a proximal branch occlusion or a significant A1 or M1 stenosis. There is motion artifact predominantly through the left MCA branch vessels with a questionable moderate left M2 inferior division stenosis. IMPRESSION: 1. No acute intracranial abnormality. 2. Moderate to severe chronic small vessel ischemic disease. 3. No large or medium vessel arterial occlusion. 4. Moderate left paraclinoid ICA stenosis. 5. 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 Electronically Signed   By: Logan Bores M.D.   On: 11/15/2019 20:49    EKG: Independently reviewed.  Assessment/Plan Principal Problem:   CRAO (central retinal artery occlusion), right Active Problems:   Benign essential HTN   S/P AVR (aortic valve replacement)    1. CRAO - 1. Stroke pathway 2. Neuro consult 3. ASA 325 4. Tele monitor 5. 2d echo 6. Carotid dopplers 7. CXR 8. A1C, FLP 2. HTN - 1. Holding home BP meds and allowing permissive HTN 3. S/p AVR -  1. 2d echo as above  DVT prophylaxis: Lovenox Code Status: Full Family Communication: No family in room Disposition Plan: Home after stroke work up [employer] called: Neuro Admission status: Admit to inpatient  Severity of Illness: The appropriate patient status for this patient is INPATIENT. Inpatient status is judged to be reasonable and necessary in order to provide the required intensity of service to ensure the patient's safety. The patient's presenting symptoms, physical exam findings, and initial radiographic and laboratory data in the context of their chronic comorbidities is felt to place them at high risk for further clinical deterioration. Furthermore, it is not anticipated that the patient will be medically stable for discharge from the hospital within 2 midnights of admission. The following factors support the patient status of inpatient.   IP status for stroke work up.   * I  certify that at the point of admission it is my clinical judgment that the patient will require inpatient hospital care spanning beyond 2 midnights from the point of admission due to high intensity of service, high risk for further deterioration and high frequency of surveillance required.*    Amarilis Belflower M. DO [facility]  How to contact the [facility] Attending or Consulting provider [place] or covering provider during after hours [place], for this patient?  1. Check the care team in [facility] and look for a) attending/consulting [facility] provider listed and b) the [facility] team listed 2. Log into [email or web address]  Amion Physician Scheduling and messaging for groups and whole hospitals  On call and physician scheduling software  for group practices, residents, hospitalists and other medical providers for call, clinic, rotation and shift schedules. OnCall Enterprise is a hospital-wide system for scheduling doctors and paging doctors on call. EasyPlot is for scientific plotting and data analysis.  [email or web address]  and use [place]'s universal password to access. If you do not have the password, please contact the hospital operator.  3. Locate the [facility] provider you are looking for under [facility] and page to a number that you can be directly reached. 4. If you still have difficulty reaching the provider, please page the [facility] (Director on Call) for the Hospitalists listed on amion for assistance.  11/15/2019, 9:59 PM

## 2019-11-16 ENCOUNTER — Inpatient Hospital Stay (HOSPITAL_COMMUNITY): Payer: Medicare Other

## 2019-11-16 ENCOUNTER — Encounter (HOSPITAL_COMMUNITY): Payer: 59

## 2019-11-16 DIAGNOSIS — H3411 Central retinal artery occlusion, right eye: Principal | ICD-10-CM

## 2019-11-16 DIAGNOSIS — I361 Nonrheumatic tricuspid (valve) insufficiency: Secondary | ICD-10-CM

## 2019-11-16 DIAGNOSIS — I350 Nonrheumatic aortic (valve) stenosis: Secondary | ICD-10-CM

## 2019-11-16 LAB — ECHOCARDIOGRAM COMPLETE
AR max vel: 1.3 cm2
AV Area VTI: 1.49 cm2
AV Area mean vel: 1.25 cm2
AV Mean grad: 10.8 mmHg
AV Peak grad: 22.9 mmHg
Ao pk vel: 2.4 m/s
Area-P 1/2: 3.31 cm2
Height: 62 in
S' Lateral: 2.7 cm
Weight: 1548.51 oz

## 2019-11-16 LAB — LIPID PANEL
Cholesterol: 212 mg/dL — ABNORMAL HIGH (ref 0–200)
HDL: 71 mg/dL
LDL Cholesterol: 130 mg/dL — ABNORMAL HIGH (ref 0–99)
Total CHOL/HDL Ratio: 3 ratio
Triglycerides: 57 mg/dL
VLDL: 11 mg/dL (ref 0–40)

## 2019-11-16 LAB — LDL CHOLESTEROL, DIRECT: Direct LDL: 124.5 mg/dL — ABNORMAL HIGH (ref 0–99)

## 2019-11-16 MED ORDER — ASPIRIN 325 MG PO TBEC
325.0000 mg | DELAYED_RELEASE_TABLET | Freq: Every day | ORAL | 0 refills | Status: DC
Start: 2019-11-17 — End: 2019-11-16

## 2019-11-16 MED ORDER — DORZOLAMIDE HCL-TIMOLOL MAL 2-0.5 % OP SOLN: 1.0000 [drp] | Freq: Two times a day (BID) | OPHTHALMIC | 0 refills | Status: AC

## 2019-11-16 MED ORDER — ATORVASTATIN CALCIUM 40 MG PO TABS
40.0000 mg | ORAL_TABLET | Freq: Every day | ORAL | Status: DC
  Administered 2019-11-16: 40 mg via ORAL
  Filled 2019-11-16: qty 1

## 2019-11-16 MED ORDER — ATORVASTATIN CALCIUM 40 MG PO TABS
40.0000 mg | ORAL_TABLET | Freq: Every day | ORAL | 3 refills | Status: DC
Start: 2019-11-17 — End: 2020-10-28

## 2019-11-16 MED ORDER — ASPIRIN 325 MG PO TBEC
325.0000 mg | DELAYED_RELEASE_TABLET | Freq: Every day | ORAL | 10 refills | Status: DC
Start: 2019-11-17 — End: 2021-11-04

## 2019-11-16 MED ORDER — IOHEXOL 300 MG/ML  SOLN
50.0000 mL | Freq: Once | INTRAMUSCULAR | Status: AC | PRN
  Administered 2019-11-16: 50 mL via INTRAVENOUS

## 2019-11-16 MED ORDER — ASPIRIN EC 325 MG PO TBEC
325.0000 mg | DELAYED_RELEASE_TABLET | Freq: Every day | ORAL | Status: DC
  Administered 2019-11-16: 325 mg via ORAL
  Filled 2019-11-16: qty 1

## 2019-11-16 NOTE — Progress Notes (Signed)
  Echocardiogram 2D Echocardiogram has been performed.  Yvonne Ballard 11/16/2019, 9:47 AM

## 2019-11-16 NOTE — Progress Notes (Signed)
SLP Cancellation Note  Patient Details Name: Yvonne Ballard MRN: [id number] DOB: 1941/02/19   Cancelled treatment:       Reason Eval/Treat Not Completed: SLP screened, no needs identified, will sign off   Elena Davia, Katherene Ponto 11/16/2019, 7:35 AM

## 2019-11-16 NOTE — Discharge Summary (Addendum)
Physician Discharge Summary  Yvonne Ballard NTZ:[id number] DOB: April 07, 1940 DOA: 11/15/2019  PCP: Kelton Pillar, MD  Admit date: 11/15/2019 Discharge date: 11/16/2019  Time spent: 50 minutes  Recommendations for Outpatient Follow-up:  1. Follow-up ophthalmology in 4 weeks 2. Follow-up cardiology in 1 week to discuss TEE as outpatient 3. 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  Ophthalmology saw the patient and was concern for central retinal artery occlusion.  Neurology also was consulted.  ESR 7, CRP 0.9, MRI brain was negative for stroke.  Hospital Course:  Central retinal artery occlusion-neurology saw the patient, patient had CTA neck, which did not show significant lesions.  Patient had Bentall procedure for thoracic aortic aneurysm, prosthetic aortic valve was not well visualized on echocardiogram.  TEE was recommended, however patient does not want to stay in the hospital for TEE.  She will follow up with Dr. Martinique to discuss TEE as outpatient.  Neurology recommends sending patient on aspirin 325 mg daily, Lipitor 40 mg daily. Continue dorzolamide timolol eyedrops for 6 more days Follow-up ophthalmology in 4 weeks Follow-up neurology in 4 weeks  Hypertension-blood pressure is mildly elevated, permissive hypertension.  Restart taking diltiazem, irbesartan at home.   Procedures:  Echocardiogram  Consultations:  Neurology  Ophthalmology  Discharge Exam: Vitals:    11/16/19 0808 11/16/19 1244  BP: (!) 155/81 (!) 173/88  Pulse: 86 82  Resp: 18 18  Temp: 98.3 F (36.8 C) 97.9 F (36.6 C)  SpO2: 98% 94%    General: Appears in no acute distress Cardiovascular: S1-S2, regular, no murmur auscultated Respiratory: Clear to auscultation bilaterally, no wheezing or crackles auscultated  Discharge Instructions   Discharge Instructions    Ambulatory referral to Neurology   Complete by: As directed    Follow up with stroke clinic NP (Jessica Vanschaick or Cecille Rubin, if both not available, consider Zachery Dauer, or Ahern) at [facility] in about 4 weeks. Thanks.   Diet - low sodium heart healthy   Complete by: As directed    Discharge instructions   Complete by: As directed    Follow-up with cardiology in 1 week to discuss TEE as outpatient   Increase activity slowly   Complete by: As directed      Allergies as of 11/16/2019   No Known Allergies     Medication List    STOP taking these medications   amoxicillin 500 MG tablet Commonly known as: AMOXIL     TAKE these medications   ALPRAZolam 0.25 MG tablet Commonly known as: XANAX Take 0.125 mg by mouth daily as needed for anxiety.   aspirin 325 MG EC tablet Take 1 tablet (325 mg total) by mouth daily. Start taking on: November 17, 2019   atorvastatin 40 MG tablet Commonly known as: LIPITOR Take 1 tablet (40 mg total) by mouth daily. Start taking on: November 17, 2019   diltiazem 120 MG 24 hr capsule Commonly known as: CARDIZEM CD Take 1 capsule (120 mg total) by mouth every morning.  diltiazem 60 MG tablet Commonly known as: CARDIZEM Take 0.5 tablets (30 mg total) by mouth as needed. What changed:   when to take this  reasons to take this   dorzolamide-timolol 22.3-6.8 MG/ML ophthalmic solution Commonly known as: COSOPT Place 1 drop into both eyes 2 (two) times daily for 6 days.   irbesartan 150 MG tablet Commonly known as: AVAPRO TAKE ONE-HALF (1/2) TABLET TWICE A  DAY What changed:   how much to take  how to take this  when to take this   multivitamin with minerals tablet Take 1 tablet by mouth every 14 (fourteen) days.      No Known Allergies  Follow-up Information    [facility]. Schedule an appointment as soon as possible for a visit in 4 week(s).   Specialty: Neurology Contact information: [address] [place]. Schedule an appointment as soon as possible for a visit in 4 week(s).        Martinique, Peter M, MD. Schedule an appointment as soon as possible for a visit in 1 week(s).   Specialty: Cardiology Contact information: [address] [place] [address] [phone]                The results of significant diagnostics from this hospitalization (including imaging, microbiology, ancillary and laboratory) are listed below for reference.    Significant Diagnostic Studies: DG Chest 2 View  Result Date: 11/15/2019 CLINICAL DATA:  Stroke. EXAM: CHEST - 2 VIEW COMPARISON:  Radiograph 04/14/2018.  CT 03/24/2014 FINDINGS: Median sternotomy with prosthetic aortic cardiac valve. Diffuse aortic tortuosity and atherosclerosis. Heart is normal in size. No pulmonary edema, focal airspace disease, pleural effusion or pneumothorax. Chronic compression deformity in the midthoracic spine. No acute osseous abnormalities are seen. IMPRESSION: No acute abnormality. Again seen aortic atherosclerosis and tortuosity. Aortic Atherosclerosis (ICD10-I70.0). Electronically Signed   By: Keith Rake M.D.   On: 11/15/2019 22:16   CT ANGIO NECK W OR WO CONTRAST  Result Date: 11/16/2019 CLINICAL DATA:  Stroke/TIA. EXAM: CT ANGIOGRAPHY NECK TECHNIQUE: Multidetector CT imaging of the neck was performed using the standard protocol during bolus administration of intravenous contrast. Multiplanar CT image reconstructions and MIPs were obtained to  evaluate the vascular anatomy. Carotid stenosis measurements (when applicable) are obtained utilizing NASCET criteria, using the distal internal carotid diameter as the denominator. CONTRAST:  31m OMNIPAQUE IOHEXOL 300 MG/ML  SOLN COMPARISON:  None similar FINDINGS: Arteries: Aneurysmal descending aorta, measuring up to 4.2 cm on axial slices. There has been an ascending and arch repair with great vessel reimplantation, ostia not covered. Where covered, the great vessels are widely patent. There is ICA tortuosity and right more than left kinking without atheromatous stenosis. The left vertebral artery arises from the left subclavian artery which appears native and less intensely enhancing than the right. No superimposed stenosis is noted. The left vertebral artery is progressively dense towards the basilar and possibly flowing retrograde. Skeleton: No acute or aggressive finding. Diffuse disc narrowing and endplate degeneration with degenerative sclerosis anteriorly at T1-2 there is disc collapse. Other neck: No evidence of inflammation or mass. Upper chest: Aneurysmal aorta is noted above. IMPRESSION: 1. Aneurysmal thoracic aorta with ascending and arch repair and great vessel reimplantation (which is not visualized). The left subclavian has a native position with graft covering the ostium and asymmetrically faint enhancement. There could be left vertebral retrograde flow. 2. No significant atherosclerosis in  the neck. Electronically Signed   By: Monte Fantasia M.D.   On: 11/16/2019 13:16   MR ANGIO HEAD WO CONTRAST  Result Date: 11/15/2019 CLINICAL DATA:  Sudden onset painless vision loss. EXAM: MRI HEAD WITHOUT AND WITH CONTRAST MRA HEAD WITHOUT CONTRAST TECHNIQUE: Multiplanar, multiecho pulse sequences of the brain and surrounding structures were obtained without and with intravenous contrast. Angiographic images of the head were obtained using MRA technique without contrast. CONTRAST:  28m GADAVIST  GADOBUTROL 1 MMOL/ML IV SOLN COMPARISON:  Head MRI 04/14/2018 and MRA 02/07/2017 FINDINGS: MRI HEAD FINDINGS Brain: No acute infarct, mass, midline shift, or extra-axial fluid collection is identified. Scattered chronic cerebral microhemorrhages have at most minimally increased in number from the prior MRI. Patchy T2 hyperintensities in the cerebral white matter bilaterally are unchanged and nonspecific but compatible with moderate to severe chronic small vessel ischemic disease. Small chronic infarcts are again seen in the right corona, bilateral basal ganglia, and right pons. Mild cerebral atrophy is within normal limits for age. No abnormal enhancement is identified. Vascular: Major intracranial vascular flow voids are preserved. Skull and upper cervical spine: Unremarkable bone marrow signal. Sinuses/Orbits: Unremarkable orbits. Paranasal sinuses and mastoid air cells are clear. Other: None. MRA HEAD FINDINGS The study is mildly motion degraded. The visualized distal vertebral arteries are patent to the basilar with the right being strongly dominant. The basilar artery is widely patent. There are right larger than left posterior communicating arteries. Both PCAs are patent with branch vessel irregularity and attenuation but no evidence of a flow limiting proximal stenosis. The internal carotid arteries are patent from skull base to carotid termini without evidence of a significant stenosis on the right. There is a moderate left paraclinoid ICA stenosis which is stable to mildly progressive with assessment mildly limited by motion on today's study. A 2 mm superiorly projecting left paraophthalmic ICA aneurysm is questioned. ACAs and MCAs are patent without evidence of a proximal branch occlusion or a significant A1 or M1 stenosis. There is motion artifact predominantly through the left MCA branch vessels with a questionable moderate left M2 inferior division stenosis. IMPRESSION: 1. No acute intracranial  abnormality. 2. Moderate to severe chronic small vessel ischemic disease. 3. No large or medium vessel arterial occlusion. 4. Moderate left paraclinoid ICA stenosis. 5. 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 Electronically Signed   By: ALogan BoresM.D.   On: 11/15/2019 20:49   MR Brain W and Wo Contrast  Result Date: 11/15/2019 CLINICAL DATA:  Sudden onset painless vision loss. EXAM: MRI HEAD WITHOUT AND WITH CONTRAST MRA HEAD WITHOUT CONTRAST TECHNIQUE: Multiplanar, multiecho pulse sequences of the brain and surrounding structures were obtained without and with intravenous contrast. Angiographic images of the head were obtained using MRA technique without contrast. CONTRAST:  467mGADAVIST GADOBUTROL 1 MMOL/ML IV SOLN COMPARISON:  Head MRI 04/14/2018 and MRA 02/07/2017 FINDINGS: MRI HEAD FINDINGS Brain: No acute infarct, mass, midline shift, or extra-axial fluid collection is identified. Scattered chronic cerebral microhemorrhages have at most minimally increased in number from the prior MRI. Patchy T2 hyperintensities in the cerebral white matter bilaterally are unchanged and nonspecific but compatible with moderate to severe chronic small vessel ischemic disease. Small chronic infarcts are again seen in the right corona, bilateral basal ganglia, and right pons. Mild cerebral atrophy is within normal limits for age. No abnormal enhancement is identified. Vascular: Major intracranial vascular flow voids are preserved. Skull and upper cervical spine: Unremarkable bone marrow signal. Sinuses/Orbits: Unremarkable orbits. Paranasal  sinuses and mastoid air cells are clear. Other: None. MRA HEAD FINDINGS The study is mildly motion degraded. The visualized distal vertebral arteries are patent to the basilar with the right being strongly dominant. The basilar artery is widely patent. There are right larger than left posterior communicating arteries. Both PCAs are patent with branch vessel irregularity and  attenuation but no evidence of a flow limiting proximal stenosis. The internal carotid arteries are patent from skull base to carotid termini without evidence of a significant stenosis on the right. There is a moderate left paraclinoid ICA stenosis which is stable to mildly progressive with assessment mildly limited by motion on today's study. A 2 mm superiorly projecting left paraophthalmic ICA aneurysm is questioned. ACAs and MCAs are patent without evidence of a proximal branch occlusion or a significant A1 or M1 stenosis. There is motion artifact predominantly through the left MCA branch vessels with a questionable moderate left M2 inferior division stenosis. IMPRESSION: 1. No acute intracranial abnormality. 2. Moderate to severe chronic small vessel ischemic disease. 3. No large or medium vessel arterial occlusion. 4. Moderate left paraclinoid ICA stenosis. 5. 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 Electronically Signed   By: Logan Bores M.D.   On: 11/15/2019 20:49   ECHOCARDIOGRAM COMPLETE  Result Date: 11/16/2019    ECHOCARDIOGRAM REPORT   Patient Name:   Yvonne Ballard Date of Exam: 11/16/2019 Medical Rec #:  [id number]      Height:       62.0 in Accession #:    [id number]     Weight:       96.8 lb Date of Birth:  1940/12/12     BSA:          1.404 m Patient Age:    62 years       BP:           155/81 mmHg Patient Gender: F              HR:           86 bpm. Exam Location:  Inpatient Procedure: 2D Echo, Cardiac Doppler and Color Doppler Indications:    Stroke  History:        Patient has prior history of Echocardiogram examinations, most                 recent 02/08/2017. CAD, Aortic Valve Disease; Risk                 Factors:Hypertension and Dyslipidemia. BIO-BENTALL PROCEDURE                 using a 76m Magna Ease Aortic Tissue Valve.  Sonographer:    AClayton LefortRDCS (AE) Referring Phys: [id number] 1. Left ventricular ejection fraction, by estimation, is 60 to 65%.  The left ventricle has normal function. The left ventricle has no regional wall motion abnormalities. There is moderate left ventricular hypertrophy. Left ventricular diastolic parameters are consistent with Grade II diastolic dysfunction (pseudonormalization). Elevated left atrial pressure.  2. Right ventricular systolic function is normal. The right ventricular size is normal. There is normal pulmonary artery systolic pressure. The estimated right ventricular systolic pressure is 264.3mmHg.  3. Left atrial size was moderately dilated.  4. Right atrial size was mildly dilated.  5. The mitral valve was not well visualized. Trivial mitral valve regurgitation.  6. Tricuspid valve regurgitation is mild to moderate.  7. The aortic valve has been repaired/replaced. Aortic valve regurgitation is  not visualized. Mild aortic valve stenosis. Vmax 2.6 m/s, MG 12 mmHg, AVA 1.4 cm^2, DI 0.34  8. The inferior vena cava is normal in size with greater than 50% respiratory variability, suggesting right atrial pressure of 3 mmHg.  9. Filamentous structure in right atrium likely represents Chiari network 10. S/p Bentall procedure, prosthetic aortic valve not well visualized. Given presentation with CVA, would recommend TEE for further evaluation FINDINGS  Left Ventricle: Left ventricular ejection fraction, by estimation, is 60 to 65%. The left ventricle has normal function. The left ventricle has no regional wall motion abnormalities. The left ventricular internal cavity size was normal in size. There is  moderate left ventricular hypertrophy. Left ventricular diastolic parameters are consistent with Grade II diastolic dysfunction (pseudonormalization). Elevated left atrial pressure. Right Ventricle: The right ventricular size is normal. Right vetricular wall thickness was not assessed. Right ventricular systolic function is normal. There is normal pulmonary artery systolic pressure. The tricuspid regurgitant velocity is 2.32 m/s,  and with an assumed right atrial pressure of 3 mmHg, the estimated right ventricular systolic pressure is 37.3 mmHg. Left Atrium: Left atrial size was moderately dilated. Right Atrium: Right atrial size was mildly dilated. Prominent Chiari network. Pericardium: There is no evidence of pericardial effusion. Mitral Valve: The mitral valve was not well visualized. Trivial mitral valve regurgitation. MV peak gradient, 3.9 mmHg. The mean mitral valve gradient is 2.0 mmHg. Tricuspid Valve: The tricuspid valve is normal in structure. Tricuspid valve regurgitation is mild to moderate. Aortic Valve: The aortic valve has been repaired/replaced. Aortic valve regurgitation is not visualized. Mild aortic stenosis is present. Aortic valve mean gradient measures 10.8 mmHg. Aortic valve peak gradient measures 22.9 mmHg. Aortic valve area, by VTI measures 1.49 cm. There is a bioprosthetic valve present in the aortic position. Pulmonic Valve: The pulmonic valve was not well visualized. Pulmonic valve regurgitation is not visualized. Aorta: The aortic root and ascending aorta are structurally normal, with no evidence of dilitation. Venous: The inferior vena cava is normal in size with greater than 50% respiratory variability, suggesting right atrial pressure of 3 mmHg. IAS/Shunts: No atrial level shunt detected by color flow Doppler.  LEFT VENTRICLE PLAX 2D LVIDd:         3.90 cm  Diastology LVIDs:         2.70 cm  LV e' lateral:   6.37 cm/s LV PW:         1.10 cm  LV E/e' lateral: 14.6 LV IVS:        1.40 cm  LV e' medial:    3.00 cm/s LVOT diam:     2.30 cm  LV E/e' medial:  31.0 LV SV:         65 LV SV Index:   46 LVOT Area:     4.15 cm  RIGHT VENTRICLE            IVC RV Basal diam:  3.20 cm    IVC diam: 1.00 cm RV S prime:     8.18 cm/s TAPSE (M-mode): 1.5 cm LEFT ATRIUM             Index       RIGHT ATRIUM           Index LA diam:        3.10 cm 2.21 cm/m  RA Area:     16.00 cm LA Vol (A2C):   62.2 ml 44.29 ml/m RA Volume:    40.30 ml  28.70 ml/m LA  Vol (A4C):   53.6 ml 38.17 ml/m LA Biplane Vol: 60.7 ml 43.23 ml/m  AORTIC VALVE AV Area (Vmax):    1.30 cm AV Area (Vmean):   1.25 cm AV Area (VTI):     1.49 cm AV Vmax:           239.50 cm/s AV Vmean:          149.750 cm/s AV VTI:            0.436 m AV Peak Grad:      22.9 mmHg AV Mean Grad:      10.8 mmHg LVOT Vmax:         74.70 cm/s LVOT Vmean:        44.900 cm/s LVOT VTI:          0.156 m LVOT/AV VTI ratio: 0.36  AORTA Ao Root diam: 3.50 cm Ao Asc diam:  3.00 cm MITRAL VALVE               TRICUSPID VALVE MV Area (PHT): 3.31 cm    TR Peak grad:   21.5 mmHg MV Peak grad:  3.9 mmHg    TR Vmax:        232.00 cm/s MV Mean grad:  2.0 mmHg MV Vmax:       0.98 m/s    SHUNTS MV Vmean:      63.7 cm/s   Systemic VTI:  0.16 m MV Decel Time: 229 msec    Systemic Diam: 2.30 cm MV E velocity: 93.10 cm/s MV A velocity: 80.00 cm/s MV E/A ratio:  1.16 Oswaldo Milian MD Electronically signed by Oswaldo Milian MD Signature Date/Time: 11/16/2019/1:31:45 PM    Final     Microbiology: Recent Results (from the past 240 hour(s))  SARS Coronavirus 2 by RT PCR (hospital order, performed in [place] hospital lab) Nasopharyngeal Nasopharyngeal Swab     Status: None   Collection Time: 11/15/19  5:38 PM   Specimen: Nasopharyngeal Swab  Result Value Ref Range Status   SARS Coronavirus 2 NEGATIVE NEGATIVE Final    Comment: (NOTE) SARS-CoV-2 target nucleic acids are NOT DETECTED.  The SARS-CoV-2 RNA is generally detectable in upper and lower respiratory specimens during the acute phase of infection. The lowest concentration of SARS-CoV-2 viral copies this assay can detect is 250 copies / mL. A negative result does not preclude SARS-CoV-2 infection and should not be used as the sole basis for treatment or other patient management decisions.  A negative result may occur with improper specimen collection / handling, submission of specimen other than nasopharyngeal swab, presence of  viral mutation(s) within the areas targeted by this assay, and inadequate number of viral copies (<250 copies / mL). A negative result must be combined with clinical observations, patient history, and epidemiological information.  Fact Sheet for Patients:   StrictlyIdeas.no  Fact Sheet for Healthcare Providers: BankingDealers.co.za  This test is not yet approved or  cleared by the [place] FDA and has been authorized for detection and/or diagnosis of SARS-CoV-2 by FDA under an Emergency Use Authorization (EUA).  This EUA will remain in effect (meaning this test can be used) for the duration of the COVID-19 declaration under Section 564(b)(1) of the Act, 21 U.S.C. section 360bbb-3(b)(1), unless the authorization is terminated or revoked sooner.  Performed at [facility], [place] [address], [place], [place] [address]      Labs: Basic Metabolic Panel: Recent Labs  Lab 11/15/19 1123  NA 136  K 3.4*  CL 101  CO2 26  GLUCOSE 127*  BUN 9  CREATININE 0.61  CALCIUM 8.5*   Liver Function Tests: Recent Labs  Lab 11/15/19 1123  AST 20  ALT 14  ALKPHOS 68  BILITOT 0.8  PROT 5.9*  ALBUMIN 3.7   No results for input(s): LIPASE, AMYLASE in the last 168 hours. No results for input(s): AMMONIA in the last 168 hours. CBC: Recent Labs  Lab 11/15/19 1123  WBC 5.5  NEUTROABS 3.3  HGB 11.5*  HCT 36.9  MCV 93.2  PLT 154       Signed:  Oswald Hillock MD.  [facility] 11/16/2019, 2:29 PM

## 2019-11-16 NOTE — Evaluation (Signed)
Physical Therapy Evaluation Patient Details Name: Yvonne Ballard MRN: [id number] DOB: 01-Dec-1940 Today's Date: 11/16/2019   History of Present Illness  This 79 y.o. 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  MRI was negative for acute intracranial abnormality, moderate to severe chronic small vessel ischemic disease, moderate Lt paraclinoid ICA stenosis, and possibel 50m Lt paraopthalmic ICA aneurysm. PMH includes: s/p AVR, polymyalgia rheumatica, HTN, CAD,AAA, anxiety  Clinical Impression  Pt is not at baseline  functioning and should be safe at home with use of the cane and available assist from family.. There are no further acute PT needs.  Will sign off at this time.     Follow Up Recommendations Home health PT    Equipment Recommendations  CKasandra Knudsen(which she has)    Recommendations for Other Services       Precautions / Restrictions Precautions Precautions: Fall Precaution Comments: Pt with impaired depth perception due to vision loss Rt eye       Mobility  Bed Mobility Overal bed mobility: Independent                Transfers Overall transfer level: Needs assistance Equipment used: None Transfers: Sit to/from Stand;Stand Pivot Transfers Sit to Stand: Supervision Stand pivot transfers: Supervision          Ambulation/Gait Ambulation/Gait assistance: Min guard Gait Distance (Feet): 180 Feet Assistive device: None Gait Pattern/deviations: Step-through pattern;Decreased stride length;Scissoring;Drifts right/left   Gait velocity interpretation: <1.8 ft/sec, indicate of risk for recurrent falls General Gait Details: pt did not have and assisstive device handy and could have benefited from a cane or "walking" stick for minor stability issues during scanning that caused scissoring, stepping and mild stagger to maintain balance.  Stairs Stairs: Yes   Stair Management: One rail Right;Step to pattern;Forwards Number of Stairs: 10 General stair  comments: safe with rail and specifica step to pattern for control  Wheelchair Mobility    Modified Rankin (Stroke Patients Only)       Balance Overall balance assessment: Mild deficits observed, not formally tested                                           Pertinent Vitals/Pain Pain Assessment: No/denies pain    Home Living Family/patient expects to be discharged to:: Private residence Living Arrangements: Alone Available Help at Discharge: Family;Available PRN/intermittently;Friend(s) Type of Home: House Home Access: Stairs to enter Entrance Stairs-Rails: Right Entrance Stairs-Number of Steps: 2+1 Home Layout: One level Home Equipment: Walker - 2 wheels;Cane - single point;Tub bench;Grab bars - tub/shower      Prior Function Level of Independence: Independent         Comments: Pt reports she ambulates with SPC when outside as she tends to loose her balance when looking up.  She reports she drives, and gets her groceries via [employer].  She denies falls, and reports she stays home most of the time due to the pandemic.  She reports good support from family and friends      Hand Dominance   Dominant Hand: Right    Extremity/Trunk Assessment   Upper Extremity Assessment Upper Extremity Assessment: Overall WFL for tasks assessed    Lower Extremity Assessment Lower Extremity Assessment: Overall WFL for tasks assessed    Cervical / Trunk Assessment Cervical / Trunk Assessment: Kyphotic  Communication   Communication: No difficulties  Cognition Arousal/Alertness: Awake/alert  Behavior During Therapy: WFL for tasks assessed/performed Overall Cognitive Status: Within Functional Limits for tasks assessed                                 General Comments: appears grossly intact.  She is tangential with conversation       General Comments General comments (skin integrity, edema, etc.): pt's loss of R vision has tended to accentuate  balance issues.  We discussed definite need for a cane/stick when not in the home where she can usually "cruise" about.    Exercises Other Exercises Other Exercises: discussed effects of loss of vision of one eye including reduced field and need for head turns to compensate (she demonstrates good awareness of her defiicts.  Also discussed impacts and safety issues associated with impaired depth perception - increased lighting, increased vigilance with tub transfers and to use grab bars; increased vigilance on stairs and use of hand rails as well as good lighting; be careful retrieving hot items from stove/oven; use a cane when outside and be extra vigilant in the community and identify curbs or changes in terrain.  She verbalizes good understanding. She reports she has been reading without difficulty    Assessment/Plan    PT Assessment All further PT needs can be met in the next venue of care  PT Problem List         PT Treatment Interventions      PT Goals (Current goals can be found in the Care Plan section)  Acute Rehab PT Goals Patient Stated Goal: to go home  PT Goal Formulation: All assessment and education complete, DC therapy    Frequency     Barriers to discharge        Co-evaluation               AM-PAC PT "6 Clicks" Mobility  Outcome Measure Help needed turning from your back to your side while in a flat bed without using bedrails?: None Help needed moving from lying on your back to sitting on the side of a flat bed without using bedrails?: A Little Help needed moving to and from a bed to a chair (including a wheelchair)?: A Little Help needed standing up from a chair using your arms (e.g., wheelchair or bedside chair)?: A Little Help needed to walk in hospital room?: A Little Help needed climbing 3-5 steps with a railing? : A Little 6 Click Score: 19    End of Session   Activity Tolerance: Patient tolerated treatment well Patient left: in chair;with call  bell/phone within reach Nurse Communication: Mobility status PT Visit Diagnosis: Unsteadiness on feet (R26.81)    Time: 1583-0940 PT Time Calculation (min) (ACUTE ONLY): 23 min   Charges:   PT Evaluation $PT Eval Moderate Complexity: 1 Mod PT Treatments $Gait Training: 8-22 mins        11/16/2019  Ginger Carne., PT Acute Rehabilitation Services [phone]  (pager) [phone]  (office)  Tessie Fass Geovonni Meyerhoff 11/16/2019, 3:43 PM

## 2019-11-16 NOTE — Progress Notes (Signed)
Occupational Therapy Evaluation  Continued education re: safety with ADLs, IADLs, and community activities and compensatory strategies for loss of depth and reduced field of vision.  She verbalized understanding of all.  She currently is requiring supervision with ADLs  Recommend [place].    11/16/19 1400  OT Visit Information  Last OT Received On 11/16/19  Assistance Needed +1  History of Present Illness This 79 y.o. 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 PMH includes: s/p AVR, polymyalgia rheumatica, HTN, CAD,AAA, anxiety  Precautions  Precautions Fall  Precaution Comments Pt with impaired depth perception due to vision loss Rt eye   Pain Assessment  Pain Assessment No/denies pain  Cognition  Arousal/Alertness Awake/alert  Behavior During Therapy WFL for tasks assessed/performed  Overall Cognitive Status Within Functional Limits for tasks assessed  Upper Extremity Assessment  Upper Extremity Assessment Overall WFL for tasks assessed  Lower Extremity Assessment  Lower Extremity Assessment Defer to PT evaluation  ADL  General ADL Comments Pt able to perform ADLs with supervision - she dressed self prior to OTs arrival   Bed Mobility  Overal bed mobility Independent  Balance  Overall balance assessment Mild deficits observed, not formally tested  Transfers  Overall transfer level Needs assistance  Equipment used None  Transfers Stand Pivot Transfers;Sit to/from Stand  Sit to Stand Supervision  Stand pivot transfers Supervision  Other Exercises  Other Exercises discussed effects of loss of vision of one eye including reduced field and need for head turns to compensate (she demonstrates good awareness of her defiicts.  Also discussed impacts and safety issues  associated with impaired depth perception - increased lighting, increased vigilance with tub transfers and to use grab bars; increased vigilance on stairs and use of hand rails as well as good lighting; be careful retrieving hot items from stove/oven; use a cane when outside and be extra vigilant in the community and identify curbs or changes in terrain.  She verbalizes good understanding. She reports she has been reading without difficulty   OT - End of Session  Activity Tolerance Patient tolerated treatment well  Patient left Other (comment) (with PT )  OT Assessment/Plan  OT Plan Discharge plan remains appropriate  OT Visit Diagnosis Unsteadiness on feet (R26.81);Low vision, both eyes (H54.2)  OT Frequency (ACUTE ONLY) Min 2X/week  Follow Up Recommendations Home health OT;Supervision - Intermittent  OT Equipment None recommended by OT  AM-PAC OT "6 Clicks" Daily Activity Outcome Measure (Version 2)  Help from another person eating meals? 4  Help from another person taking care of personal grooming? 3  Help from another person toileting, which includes using toliet, bedpan, or urinal? 3  Help from another person bathing (including washing, rinsing, drying)? 3  Help from another person to put on and taking off regular upper body clothing? 3  Help from another person to put on and taking off regular lower body clothing? 3  6 Click Score 19  OT Goal Progression  Progress towards OT goals Progressing toward goals  OT Time Calculation  OT Start Time (ACUTE ONLY) 1304  OT Stop Time (ACUTE ONLY) 1335  OT Time Calculation (min) 31 min  OT General Charges  $OT Visit 1 Visit  OT Treatments  $Self Care/Home Management  23-37 mins  Nilsa Nutting., OTR/L Acute Rehabilitation Services Pager [phone] Office  [phone]  

## 2019-11-16 NOTE — Evaluation (Signed)
Occupational Therapy Evaluation Patient Details Name: Yvonne Ballard MRN: [id number] DOB: 1940/08/07 Today's Date: 11/16/2019    History of Present Illness This 79 y.o. 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 PMH includes: s/p AVR, polymyalgia rheumatica, HTN, CAD,AAA, anxiety   Clinical Impression   Pt admitted with above. She demonstrates the below listed deficits and will benefit from continued OT to maximize safety and independence with BADLs.  Pt presents to OT with decreased vision in Rt eye resulting in deficits with depth perception.  She also has some mild balance deficits at baseline.  She currently requires supervision for ADLs.  She reports she lives alone and was fully independent PTA.  Recommend no driving and she agrees, and also recommend HHOT at discharge.  Will follow acutely.       Follow Up Recommendations  Home health OT;Supervision - Intermittent    Equipment Recommendations  None recommended by OT    Recommendations for Other Services       Precautions / Restrictions Precautions Precautions: Fall Precaution Comments: Pt with impaired depth perception due to vision loss Rt eye       Mobility Bed Mobility Overal bed mobility: Independent                Transfers Overall transfer level: Needs assistance                    Balance                                           ADL either performed or assessed with clinical judgement   ADL Overall ADL's : Needs assistance/impaired Eating/Feeding: Independent   Grooming: Wash/dry hands;Wash/dry face;Oral care;Brushing hair;Supervision/safety   Upper Body Bathing: Set up;Sitting   Lower Body Bathing: Supervison/ safety;Sit to/from stand   Upper Body Dressing  : Set up;Sitting   Lower Body Dressing: Supervision/safety;Sit to/from stand   Toilet Transfer: Supervision/safety;Ambulation Toilet Transfer Details (indicate cue type and reason): pt tends to furniture walk          Functional mobility during ADLs: Supervision/safety       Vision Baseline Vision/History: Wears glasses Wears Glasses: Reading only Patient Visual Report:  (loss of vision in Rt eye ) Additional Comments: Pt reports that she is now able to see light in the periphery of Rt eye, and can see some nondescript images in her central field with the Rt eye.  She reports this is an improvement from yesterday as everything was black/dark in the Rt eye yesterday      Perception Perception Perception Tested?: Yes   Praxis Praxis Praxis tested?: Within functional limits    Pertinent Vitals/Pain Pain Assessment: No/denies pain     Hand Dominance Right   Extremity/Trunk Assessment Upper Extremity Assessment Upper Extremity Assessment: Overall WFL for tasks assessed (arthritic deformity noted )   Lower Extremity Assessment Lower Extremity Assessment: Overall WFL for tasks assessed   Cervical / Trunk Assessment Cervical / Trunk Assessment: Kyphotic   Communication Communication Communication: No difficulties   Cognition Arousal/Alertness: Awake/alert Behavior During Therapy: WFL for tasks assessed/performed Overall Cognitive Status: Within Functional Limits for tasks assessed  General Comments: appears grossly intact.  She is tangential with conversation    General Comments  session limited due to transporter arrived to take pt to [place].       Exercises Exercises: Other exercises Other Exercises Other Exercises: Discussed loss of depth perception with pt and she acknowledged that she is experiencing difficulties with certain tasks like pouring water into a cup (missed the cup), and does report over and undershooting.   Discussed with her recommendation for no driving and she was okay with that, agreeing that it would not be safe.  She reports her friends will assist with transport as needed and that she already uses [employer] for grocery delivery.  Discussed other areas/activities in which loss of depth perception could be especially troublesome such as stairs, bath tub, etc.  Pt was able to assist in problem solving through compensations    Shoulder Instructions      Home Living Family/patient expects to be discharged to:: Private residence Living Arrangements: Alone Available Help at Discharge: Family;Available PRN/intermittently;Friend(s) Type of Home: House Home Access: Stairs to enter [employer] of Steps: 2+1 Entrance Stairs-Rails: Right Home Layout: One level     Bathroom Shower/Tub: Teacher, early years/pre: Standard     Home Equipment: Environmental consultant - 2 wheels;Cane - single point;Tub bench;Grab bars - tub/shower          Prior Functioning/Environment Level of Independence: Independent        Comments: Pt reports she ambulates with SPC when outside as she tends to loose her balance when looking up.  She reports she drives, and gets her groceries via [employer].  She denies falls, and reports she stays home most of the time due to the pandemic.  She reports good support from family and friends         OT Problem List: Impaired balance (sitting and/or standing);Impaired vision/perception;Decreased knowledge of use of DME or AE      OT Treatment/Interventions: Self-care/ADL training;DME and/or AE instruction;Neuromuscular education;Therapeutic activities;Visual/perceptual remediation/compensation;Patient/family education;Balance training    OT Goals(Current goals can be found in the care plan section) Acute Rehab OT Goals Patient Stated Goal: to go home  OT Goal Formulation: With patient Time For Goal Achievement: 11/30/19 Potential to Achieve Goals: Good ADL Goals Pt  Will Perform Grooming: with modified independence;standing Pt Will Perform Upper Body Bathing: with modified independence;sitting Pt Will Perform Lower Body Bathing: with modified independence;sit to/from stand Pt Will Perform Upper Body Dressing: with modified independence;sitting Pt Will Perform Lower Body Dressing: with modified independence;sit to/from stand Pt Will Transfer to Toilet: with modified independence;ambulating;regular height toilet Pt Will Perform Toileting - Clothing Manipulation and hygiene: with modified independence;sit to/from stand Pt Will Perform Tub/Shower Transfer: Tub transfer;with modified independence;ambulating;shower seat;tub bench;grab bars Additional ADL Goal #1: Pt will demonstrate understanding of depth perception deficits and how to safely compensate during daily activities  OT Frequency: Min 2X/week   Barriers to D/C: Decreased caregiver support  Pt lives alone, but does have good supports from local daughter and neighbors        Co-evaluation              AM-PAC OT "6 Clicks" Daily Activity     Outcome Measure Help from another person eating meals?: None Help from another person taking care of personal grooming?: A Little Help from another person toileting, which includes using toliet, bedpan, or urinal?: A Little Help from another person bathing (including washing, rinsing, drying)?: A Little Help from another  person to put on and taking off regular upper body clothing?: A Little Help from another person to put on and taking off regular lower body clothing?: A Little 6 Click Score: 19   End of Session Nurse Communication: Other (comment) (transport to [place] )  Activity Tolerance: Patient tolerated treatment well Patient left: in bed;with call bell/phone within reach;with bed alarm set  OT Visit Diagnosis: Unsteadiness on feet (R26.81);Low vision, both eyes (H54.2)                Time: 9924-2683 OT Time Calculation (min): 25 min Charges:   OT General Charges $OT Visit: 1 Visit OT Evaluation $OT Eval Moderate Complexity: 1 Mod OT Treatments $Self Care/Home Management : 8-22 mins  Nilsa Nutting., OTR/L Acute Rehabilitation Services Pager [phone] Office [phone]   Lucille Passy M 11/16/2019, 11:07 AM

## 2019-11-16 NOTE — Progress Notes (Signed)
STROKE TEAM PROGRESS NOTE   INTERVAL HISTORY Her OT is at the bedside.  Pt sitting in chair, stated that her right eye vision has some improvement and can see light and shadow on the right upper quadrant. Denies any HA or pain. She stated that she has to go home by 2pm today. CTA neck and TTE result pending.   OBJECTIVE Vitals:   11/15/19 2300 11/16/19 0407 11/16/19 0438 11/16/19 0808  BP: (!) 184/107 102/86 104/66 (!) 155/81  Pulse: 71 66 70 86  Resp:  18  18  Temp:  98.4 F (36.9 C)  98.3 F (36.8 C)  TempSrc:  Oral    SpO2: 97% 96% 99% 98%  Weight:   43.9 kg   Height:   5\' 2"  (1.575 m)     CBC:  Recent Labs  Lab 11/15/19 1123  WBC 5.5  NEUTROABS 3.3  HGB 11.5*  HCT 36.9  MCV 93.2  PLT 259    Basic Metabolic Panel:  Recent Labs  Lab 11/15/19 1123  NA 136  K 3.4*  CL 101  CO2 26  GLUCOSE 127*  BUN 9  CREATININE 0.61  CALCIUM 8.5*    Lipid Panel:     Component Value Date/Time   CHOL 212 (H) 11/16/2019 0545   CHOL 215 (H) 11/04/2019 1545   TRIG 57 11/16/2019 0545   HDL 71 11/16/2019 0545   HDL 72 11/04/2019 1545   CHOLHDL 3.0 11/16/2019 0545   VLDL 11 11/16/2019 0545   LDLCALC 130 (H) 11/16/2019 0545   LDLCALC 123 (H) 11/04/2019 1545   HgbA1c:  Lab Results  Component Value Date   HGBA1C 5.4 11/15/2019   Urine Drug Screen:     Component Value Date/Time   LABOPIA NONE DETECTED 02/07/2017 1340   COCAINSCRNUR NONE DETECTED 02/07/2017 1340   LABBENZ NONE DETECTED 02/07/2017 1340   AMPHETMU NONE DETECTED 02/07/2017 1340   THCU NONE DETECTED 02/07/2017 1340   LABBARB NONE DETECTED 02/07/2017 1340    Alcohol Level     Component Value Date/Time   ETH <10 02/07/2017 1221    IMAGING  DG Chest 2 View 11/15/2019 IMPRESSION:  No acute abnormality. Again seen aortic atherosclerosis and tortuosity. Aortic Atherosclerosis (ICD10-I70.0).   MR Brain W and Wo Contrast MR ANGIO HEAD WO CONTRAST 11/15/2019 IMPRESSION:  1. No acute intracranial  abnormality.  2. Moderate to severe chronic small vessel ischemic disease.  3. No large or medium vessel arterial occlusion.  4. Moderate left paraclinoid ICA stenosis.  5. 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   CTA Head and Neck - pending  Transthoracic Echocardiogram  00/00/2021 Pending  ECG - SR rate 60 BPM. (See cardiology reading for complete details)   PHYSICAL EXAM  Temp:  [97.9 F (36.6 C)-98.4 F (36.9 C)] 97.9 F (36.6 C) (09/04 1244) Pulse Rate:  [58-86] 82 (09/04 1244) Resp:  [13-18] 18 (09/04 1244) BP: (102-184)/(66-154) 173/88 (09/04 1244) SpO2:  [94 %-99 %] 94 % (09/04 1244) Weight:  [43.9 kg] 43.9 kg (09/04 0438)  General - Well nourished, well developed, in no apparent distress.  Ophthalmologic - fundi not visualized due to noncooperation.  Cardiovascular - Regular rhythm and rate.  Mental Status -  Level of arousal and orientation to time, place, and person were intact. Language including expression, naming, repetition, comprehension was assessed and found intact. Fund of Knowledge was assessed and was intact.  Cranial Nerves II - XII - II - Visual field intact OS. OD right upper  quadrant can seen FC and lower quadrant can see HW, left half no vision.  III, IV, VI - Extraocular movements intact. V - Facial sensation intact bilaterally. VII - Facial movement intact bilaterally. VIII - Hearing & vestibular intact bilaterally. X - Palate elevates symmetrically. XI - Chin turning & shoulder shrug intact bilaterally. XII - Tongue protrusion intact.  Motor Strength - The patient's strength was normal in all extremities and pronator drift was absent.  Bulk was normal and fasciculations were absent.   Motor Tone - Muscle tone was assessed at the neck and appendages and was normal.  Reflexes - The patient's reflexes were symmetrical in all extremities and she had no pathological reflexes.  Sensory - Light touch, temperature/pinprick were  assessed and were symmetrical.    Coordination - The patient had normal movements in the hands and feet with no ataxia or dysmetria.  Tremor was absent.  Gait and Station - deferred.    ASSESSMENT/PLAN [name] is a 79 y.o. female with history of HTN, AAA, HLD, anxiety, mild non obstructive CAD, s/p AVR (tissue valve) who presents with sudden onset of painless vision loss right eye.  She did not receive IV t-PA due to late presentation (>4.5 hours from time of onset)  CRAO, right - likely atherosclerosis, although cardiac source can not be completely ruled out at this time  MRI head - No acute intracranial abnormality. Moderate to severe chronic small vessel ischemic disease.  MRA head - Moderate left paraclinoid ICA stenosis. 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   CTA Neck - Aneurysmal thoracic aorta with ascending and arch repair and great vessel reimplantation.   2D Echo - EF 60-65%. 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 S/p Bentall procedure, prosthetic aortic valve not well visualized. Given presentation with CVA, would recommend TEE for further evaluation   Yvonne Ballard Virus 2 - negative  Sed Rate and CRP both normal  LDL - 123  HgbA1c - 5.4  VTE prophylaxis - Lovenox  No antithrombotic prior to admission, now on aspirin 325 mg daily. Continue on discharge.  Patient counseled to be compliant with her antithrombotic medications  Ongoing aggressive stroke risk factor management  Therapy recommendations:  HH OT   Disposition:  Pt requested to go home by 2pm today - follow up with ophthalmology and cardilogy  Hx of stroke   01/2017 admitted for slurry and left facial numbness. MRI showed right BG/CR/caudate infarct. EF 60-65% MRA head and neck showed negative except small aortic dissection flap. She was discharged with ASA and lipitor.  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   Discussed with pt and Dr. Darrick Ballard, pt follows with Dr. Martinique cardiology. Recommended to see Dr. Martinique ASAP to discuss about outpt TEE and to rule out other possible source of emboli  Hypertension  Home BP meds: diltiazem ; irbesartan   Current BP meds: apresoline prn  BP somewhat high at times  . 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   Hospital day # 1  Neurology will sign off. Please call with questions. Pt will follow up with stroke clinic NP at [facility] in about 4 weeks. Thanks for the consult.   Rosalin Hawking, MD PhD Stroke Neurology 11/17/2019 7:17 AM    To contact Stroke Continuity provider, please refer to [email or web address] After hours, contact General Neurology

## 2019-11-17 ENCOUNTER — Other Ambulatory Visit: Payer: Self-pay | Admitting: Neurology

## 2019-11-20 ENCOUNTER — Telehealth: Payer: Self-pay | Admitting: Cardiology

## 2019-11-20 NOTE — Telephone Encounter (Signed)
    Pt would like to speak with Malachy Mood.

## 2019-11-20 NOTE — Telephone Encounter (Signed)
Spoke to patient she stated she did not want to have TEE.Stated if she decides to have done she will call back.Advised I will make Dr.Jordan aware.

## 2019-11-20 NOTE — Telephone Encounter (Signed)
Can follow up with APP for consideration of outpatient TEE.  Aland Chestnutt Martinique MD, [facility]

## 2019-11-20 NOTE — Telephone Encounter (Signed)
Returned call to patient. Patient would like Dr. Martinique to know she was in the hospital last weekend due to vision loss in her right eye, patient stated that the Dr. She saw told her to let her cardiologist know. Patient wants to know if she needs to follow up with Dr. Martinique and if she should continue her current medication regimen. Patient stated she currently feels fine and denies any symptoms. Advised patient I would forward this message to Dr. Martinique for review.

## 2020-01-06 ENCOUNTER — Other Ambulatory Visit: Payer: Self-pay | Admitting: Cardiology

## 2020-01-06 MED ORDER — DILTIAZEM HCL ER COATED BEADS 120 MG PO CP24
120.0000 mg | ORAL_CAPSULE | Freq: Two times a day (BID) | ORAL | 3 refills | Status: DC
Start: 2020-01-06 — End: 2021-03-22

## 2020-08-18 NOTE — Telephone Encounter (Signed)
Spoke to patient follow up appointment scheduled with Dr.Jordan 10/28/20 at 1:30 pm.

## 2020-10-22 NOTE — Progress Notes (Signed)
Cardiology Office Note   Date:  10/28/2020   ID:  Yvonne Ballard, DOB 10/12/1940, MRN [id number]  PCP:  Kelton Pillar, MD  Cardiologist:  Dr. Martinique   History of Present Illness: Yvonne Ballard is a 80 y.o. female with a hx of HTN, HLD, thoracic aortic aneurysm s/p Bental procedure (03/2014), and nonobst CAD who presents for follow up   She was initially seen in 03/2014. A 2D echo was done which showed a large thoracic aneurysm and the patient underwent heart catheterization showing mild nonobstructive CAD with mild LV dysfunction EF 45% with thoracic aneurysm and mildly elevated PA pressures.  She underwent ascending and arch TAA repair with Bio-Bental procedure with tissue AVR and aorto-brachiocephalic bypass and aorto-left common carotid bypass on 03/31/2014.   She was admitted in November 2018 with left facial droop and dysarthria. CT acutely was negative. MRI significant for acute infarction involving the right posterior lentiform nucleus, mid corona radiata, and caudate body. She had evidence of moderate chronic microvascular changes. She was treated with lipitor and ASA.   She was seen in February 2020 in the ED with some dysarthria. MRI and CT of the head showed no acute changes with chronic microvascular disease.   In September she was admitted with a right central retinal artery occlusion. MRI showed small vessel disease without CVA. CT chest done with findings noted below. Echo was stable. There was consideration of TEE as outpatient but patient never followed up with cardiology.   On follow up today she is doing well. States she gets very nervous when she has things to do. BP is better at home. Vision has improved but lost 1/2 her vision in one eye. No chest pain or dyspnea.   Past Medical History:  Diagnosis Date   Abnormal ECG    Anxiety    pt. reports that she is nervous person   Anxiety disorder    Ascending aortic aneurysm [facility])    s/p repair with Bental procedure    Coronary artery disease 03/2014   mild non obstructive CAD   Dyslipidemia, goal LDL below 70 07/26/2014   Family history of adverse reaction to anesthesia    Heart murmur    History of hiatal hernia    Hypertension    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HCC)    S/P AVR (aortic valve replacement)    tissue valve   Weight loss     Past Surgical History:  Procedure Laterality Date   BENTALL PROCEDURE N/A 03/31/2014   Procedure: BIO-BENTALL PROCEDURE using a 61mm Magna Ease Aortic Tissue Valve, a 26 mm Terumo Gelweave Valsalva Graft, a 14x10x67mm Terumo Gelweave Graft, and a 47mm straight Hemashield Platinum Graft.;  Surgeon: Ivin Poot, MD;  Location: [place];  Service: Open Heart Surgery;  Laterality: N/A;   LEFT AND RIGHT HEART CATHETERIZATION WITH CORONARY ANGIOGRAM N/A 03/26/2014   Procedure: LEFT AND RIGHT HEART CATHETERIZATION WITH CORONARY ANGIOGRAM;  Surgeon: Keiji Melland M Martinique, MD;  Location: [facility] CATH LAB;  Service: Cardiovascular;  Laterality: N/A;   TEE WITHOUT CARDIOVERSION N/A 03/31/2014   Procedure: TRANSESOPHAGEAL ECHOCARDIOGRAM (TEE);  Surgeon: Ivin Poot, MD;  Location: [place];  Service: Open Heart Surgery;  Laterality: N/A;   TONSILLECTOMY     TUBAL LIGATION       Current Outpatient Medications  Medication Sig Dispense Refill   ALPRAZolam (XANAX) 0.25 MG tablet Take 0.125 mg by mouth daily as needed for anxiety.     aspirin 325 MG EC tablet Take  1 tablet (325 mg total) by mouth daily. 30 tablet 10   diltiazem (CARDIZEM CD) 120 MG 24 hr capsule Take 1 capsule (120 mg total) by mouth in the morning and at bedtime. 180 capsule 3   diltiazem (CARDIZEM) 60 MG tablet Take 0.5 tablets (30 mg total) by mouth as needed. (Patient taking differently: Take 30 mg by mouth daily as needed (SBP >150).) 90 tablet 3   irbesartan (AVAPRO) 150 MG tablet TAKE ONE-HALF (1/2) TABLET TWICE A DAY (Patient taking differently: Take 75 mg by mouth 2 (two) times daily. TAKE ONE-HALF (1/2) TABLET TWICE A  DAY) 90 tablet 3   Multiple Vitamins-Minerals (MULTIVITAMIN WITH MINERALS) tablet Take 1 tablet by mouth every 14 (fourteen) days.      No current facility-administered medications for this visit.    Allergies:   Patient has no known allergies.    Social History:  The patient  reports that she has never smoked. She has never used smokeless tobacco. She reports current alcohol use. She reports that she does not use drugs.   Family History:  The patient's family history includes Cancer in her brother, father, and mother; Stroke in her father.    ROS:  Please see the history of present illness.   Otherwise, review of systems are positive for none.   All other systems are reviewed and negative.    PHYSICAL EXAM: VS:  BP (!) 187/88   Pulse 60   Ht 5' (1.524 m)   Wt 99 lb 12.8 oz (45.3 kg)   SpO2 98%   BMI 19.49 kg/m  , BMI Body mass index is 19.49 kg/m. repeat BP 190/90. GENERAL:  Well appearing, elderly WF in NAD HEENT:  PERRL, EOMI, sclera are clear. Oropharynx is clear. NECK:  No jugular venous distention, carotid upstroke brisk and symmetric, no bruits, no thyromegaly or adenopathy LUNGS:  Clear to auscultation bilaterally CHEST:  Unremarkable HEART:  RRR,  PMI not displaced or sustained,S1 and S2 within normal limits, no S3, no S4: no clicks, no rubs, gr 2/6 systolic murmur RUSB>LSB ABD:  Soft, nontender. BS +, no masses or bruits. No hepatomegaly, no splenomegaly EXT:  2 + pulses throughout, no edema, no cyanosis no clubbing SKIN:  Warm and dry.  No rashes NEURO:  Alert and oriented x 3. Cranial nerves II through XII intact. PSYCH:  Cognitively intact   EKG:  EKG is  today.  NSR rate 60. LAD, RBBB. I have personally reviewed and interpreted this study.    Recent Labs: 11/15/2019: ALT 14; BUN 9; Creatinine, Ser 0.61; Hemoglobin 11.5; Platelets 154; Potassium 3.4; Sodium 136    Lipid Panel    Component Value Date/Time   CHOL 212 (H) 11/16/2019 0545   CHOL 215 (H)  11/04/2019 1545   TRIG 57 11/16/2019 0545   HDL 71 11/16/2019 0545   HDL 72 11/04/2019 1545   CHOLHDL 3.0 11/16/2019 0545   VLDL 11 11/16/2019 0545   LDLCALC 130 (H) 11/16/2019 0545   LDLCALC 123 (H) 11/04/2019 1545   LDLDIRECT 124.5 (H) 11/16/2019 0545   Dated 12/13/17: cholesterol 204, triglycerides 84, HDL 62, LDL 125.  Dated 03/27/20: cholesterol 190, triglycerides 74, HDL 68, LDL 108. CMET normal.  Wt Readings from Last 3 Encounters:  10/28/20 99 lb 12.8 oz (45.3 kg)  11/16/19 96 lb 12.5 oz (43.9 kg)  11/04/19 103 lb 9.6 oz (47 kg)      Other studies Reviewed:  -- 2D ECHO (05/08/14) EF 55-60%, no RWMA, G2DD,  s/p biological Bentall procedure w/ total large replacement of a large arch aneurysm. Mean AV grad WNL for bioprosthesis. No AR. Mod MR thickening. L pleural effusion.    Echo 02/08/17: Study Conclusions   - Left ventricle: The cavity size was normal. Wall thickness was   increased in a pattern of mild LVH. Systolic function was normal.   The estimated ejection fraction was in the range of 60% to 65%.   Left ventricular diastolic function parameters were normal. - Aortic valve: Aoritc sinuses appear somewhat dilated above AVR   Post Bental AV leaflets not well seen mild gradient Root not well   visualized no AR Valve area (VTI): 1.49 cm^2. Valve area (Vmax):   1.37 cm^2. Valve area (Vmean): 1.3 cm^2. - Mitral valve: Calcified annulus. Mildly thickened leaflets . - Atrial septum: No defect or patent foramen ovale was identified  ADDENDUM: Dr. Erlinda Hong called to discuss the patient's thoracic aortic aneurysm. The luminal flap at the isthmus is contiguous with anastomosis between the prosthetic arch and the native aorta and the flap is attributed to graft material (elephant trunk). In this system the aorta has not been imaged since before her repair and the patient is presumably under the care of a cardiovascular surgeon. The aneurysmal descending aorta has increased size  since a 2016 comparison, 4.2 cm today compared to 3.5 cm ~6 years ago.     Electronically Signed   By: Monte Fantasia M.D.   On: 11/17/2019 09:09    Addended by Gilford Silvius, MD on 11/17/2019  9:11 AM   Study Result  Narrative & Impression  CLINICAL DATA:  Stroke/TIA.   EXAM: CT ANGIOGRAPHY NECK   TECHNIQUE: Multidetector CT imaging of the neck was performed using the standard protocol during bolus administration of intravenous contrast. Multiplanar CT image reconstructions and MIPs were obtained to evaluate the vascular anatomy. Carotid stenosis measurements (when applicable) are obtained utilizing NASCET criteria, using the distal internal carotid diameter as the denominator.   CONTRAST:  76mL OMNIPAQUE IOHEXOL 300 MG/ML  SOLN   COMPARISON:  None similar   FINDINGS: Arteries: Aneurysmal descending aorta, measuring up to 4.2 cm on axial slices. There has been an ascending and arch repair with great vessel reimplantation, ostia not covered. Where covered, the great vessels are widely patent. There is ICA tortuosity and right more than left kinking without atheromatous stenosis. The left vertebral artery arises from the left subclavian artery which appears native and less intensely enhancing than the right. No superimposed stenosis is noted. The left vertebral artery is progressively dense towards the basilar and possibly flowing retrograde.   Skeleton: No acute or aggressive finding. Diffuse disc narrowing and endplate degeneration with degenerative sclerosis anteriorly at T1-2 there is disc collapse.   Other neck: No evidence of inflammation or mass.   Upper chest: Aneurysmal aorta is noted above.   IMPRESSION: 1. Aneurysmal thoracic aorta with ascending and arch repair and great vessel reimplantation (which is not visualized). The left subclavian has a native position with graft covering the ostium and asymmetrically faint enhancement. There could be left  vertebral retrograde flow. 2. No significant atherosclerosis in the neck.   Electronically Signed: By: Monte Fantasia M.D. On: 11/16/2019 13:16   Echo 11/16/19: IMPRESSIONS     1. Left ventricular ejection fraction, by estimation, is 60 to 65%. The  left ventricle has normal function. The left ventricle has no regional  wall motion abnormalities. There is moderate left ventricular hypertrophy.  Left ventricular diastolic  parameters are consistent with Grade II diastolic dysfunction  (pseudonormalization). Elevated left atrial pressure.   2. Right ventricular systolic function is normal. The right ventricular  size is normal. There is normal pulmonary artery systolic pressure. The  estimated right ventricular systolic pressure is 54.6 mmHg.   3. Left atrial size was moderately dilated.   4. Right atrial size was mildly dilated.   5. The mitral valve was not well visualized. Trivial mitral valve  regurgitation.   6. Tricuspid valve regurgitation is mild to moderate.   7. The aortic valve has been repaired/replaced. Aortic valve  regurgitation is not visualized. Mild aortic valve stenosis. Vmax 2.6 m/s,  MG 12 mmHg, AVA 1.4 cm^2, DI 0.34   8. The inferior vena cava is normal in size with greater than 50%  respiratory variability, suggesting right atrial pressure of 3 mmHg.   9. 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 S/p Bentall procedure, prosthetic aortic valve not well visualized.  Given presentation with CVA, would recommend TEE for further evaluation   ASSESSMENT AND PLAN:  1. HTN -BP is labile. Large anxiety component.  Continue Avapro and Cardizem as prescribed.   2. 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  Continue ASA.   3. HLD- patient is not on statin. Refuses to take.   4. Mild LV dysfunction EF 45%-  resolved on last ECHO. EF now 60%.  5. Ascending aortic aneurysm s/p extensive Bental procedure with tissue AVR 2016 -- 2D ECHO  in November 2018 with normal LVF with stable AVR and ascending aortic replacement -- CT done September 2021 as noted above - Echo in Sept 2021 stable.   6.  CVA 11/18. Continue ASA.  7. Anxiety.   8. Right central retinal artery occlusion. September 2021. We discussed possible TEE but she is not interested in doing this.   Current medicines are reviewed at length with the patient today.  The patient does not have concerns regarding medicines.  The following changes have been made:  See above  Labs/ tests ordered today include: CMET, CBC, lipids.    Disposition:   FU in one year.    Signed, Rodrigues Urbanek Martinique, MD  10/28/2020 1:58 PM    [place]

## 2020-10-28 ENCOUNTER — Other Ambulatory Visit: Payer: Self-pay

## 2020-10-28 ENCOUNTER — Ambulatory Visit (INDEPENDENT_AMBULATORY_CARE_PROVIDER_SITE_OTHER): Payer: 59 | Admitting: Cardiology

## 2020-10-28 ENCOUNTER — Encounter: Payer: Self-pay | Admitting: Cardiology

## 2020-10-28 VITALS — BP 187/88 | HR 60 | Ht 60.0 in | Wt 99.8 lb

## 2020-10-28 DIAGNOSIS — I2510 Atherosclerotic heart disease of native coronary artery without angina pectoris: Secondary | ICD-10-CM | POA: Diagnosis not present

## 2020-10-28 DIAGNOSIS — I712 Thoracic aortic aneurysm, without rupture: Secondary | ICD-10-CM

## 2020-10-28 DIAGNOSIS — Z952 Presence of prosthetic heart valve: Secondary | ICD-10-CM

## 2020-10-28 DIAGNOSIS — I10 Essential (primary) hypertension: Secondary | ICD-10-CM | POA: Diagnosis not present

## 2020-10-28 DIAGNOSIS — I7121 Aneurysm of the ascending aorta, without rupture: Secondary | ICD-10-CM

## 2021-03-22 ENCOUNTER — Encounter: Payer: Self-pay | Admitting: Cardiology

## 2021-03-22 MED ORDER — DILTIAZEM HCL ER COATED BEADS 120 MG PO CP24: 120.0000 mg | ORAL_CAPSULE | Freq: Two times a day (BID) | ORAL | 2 refills | Status: DC

## 2021-04-12 ENCOUNTER — Encounter: Payer: Self-pay | Admitting: Cardiology

## 2021-04-12 NOTE — Telephone Encounter (Signed)
The aches don't really sound cardiac to me. BP and HR OK. I would just monitor for now. I can't really recommend anything for her aches other than Tylenol  Chadwick Reiswig Martinique MD, [facility]

## 2021-10-19 ENCOUNTER — Telehealth: Payer: Self-pay | Admitting: Cardiology

## 2021-10-19 NOTE — Telephone Encounter (Signed)
*  STAT* If patient is at the pharmacy, call can be transferred to refill team.   1. Which medications need to be refilled? (please list name of each medication and dose if known) irbesartan (AVAPRO) 150 MG tablet  2. Which pharmacy/location (including street and city if local pharmacy) is medication to be sent to? [place], [place] - [address] AT [place]  3. Do they need a 30 day or 90 day supply? 90 day

## 2021-10-20 MED ORDER — IRBESARTAN 150 MG PO TABS: ORAL_TABLET | ORAL | 0 refills | Status: DC

## 2021-10-31 NOTE — Progress Notes (Unsigned)
Cardiology Office Note   Date:  11/04/2021   ID:  Yvonne Ballard, DOB [birth year], MRN [id number]  PCP:  Kelton Pillar, MD  Cardiologist:  Dr. Martinique   History of Present Illness: Yvonne Ballard is a 81 y.o. female with a hx of HTN, HLD, thoracic aortic aneurysm s/p Bental procedure (03/2014), and nonobst CAD who presents for follow up   She was initially seen in 03/2014. A 2D echo was done which showed a large thoracic aneurysm and the patient underwent heart catheterization showing mild nonobstructive CAD with mild LV dysfunction EF 45% with thoracic aneurysm and mildly elevated PA pressures.  She underwent ascending and arch TAA repair with Bio-Bental procedure with tissue AVR and aorto-brachiocephalic bypass and aorto-left common carotid bypass on 03/31/2014.   She was admitted in November 2018 with left facial droop and dysarthria. CT acutely was negative. MRI significant for acute infarction involving the right posterior lentiform nucleus, mid corona radiata, and caudate body. She had evidence of moderate chronic microvascular changes. She was treated with lipitor and ASA.   She was seen in February 2020 in the ED with some dysarthria. MRI and CT of the head showed no acute changes with chronic microvascular disease.   In September she was admitted with a right central retinal artery occlusion. MRI showed small vessel disease without CVA. CT chest done with findings noted below. Echo was stable. There was consideration of TEE as outpatient but patient never followed up with cardiology.   On follow up today she is doing well. States she gets very nervous.  BP is OK at home.  No chest pain or dyspnea. States she doesn't take ASA regularly.   Past Medical History:  Diagnosis Date   Abnormal ECG    Anxiety    pt. reports that she is nervous person   Anxiety disorder    Ascending aortic aneurysm [facility])    s/p repair with Bental procedure   Coronary artery disease 03/2014   mild non  obstructive CAD   Dyslipidemia, goal LDL below 70 07/26/2014   Family history of adverse reaction to anesthesia    Heart murmur    History of hiatal hernia    Hypertension    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HCC)    S/P AVR (aortic valve replacement)    tissue valve   Weight loss     Past Surgical History:  Procedure Laterality Date   BENTALL PROCEDURE N/A 03/31/2014   Procedure: BIO-BENTALL PROCEDURE using a 25mm Magna Ease Aortic Tissue Valve, a 26 mm Terumo Gelweave Valsalva Graft, a 14x10x56mm Terumo Gelweave Graft, and a 27mm straight Hemashield Platinum Graft.;  Surgeon: Ivin Poot, MD;  Location: [place];  Service: Open Heart Surgery;  Laterality: N/A;   LEFT AND RIGHT HEART CATHETERIZATION WITH CORONARY ANGIOGRAM N/A 03/26/2014   Procedure: LEFT AND RIGHT HEART CATHETERIZATION WITH CORONARY ANGIOGRAM;  Surgeon: Mccade Sullenberger M Martinique, MD;  Location: [facility] CATH LAB;  Service: Cardiovascular;  Laterality: N/A;   TEE WITHOUT CARDIOVERSION N/A 03/31/2014   Procedure: TRANSESOPHAGEAL ECHOCARDIOGRAM (TEE);  Surgeon: Ivin Poot, MD;  Location: [place];  Service: Open Heart Surgery;  Laterality: N/A;   TONSILLECTOMY     TUBAL LIGATION       Current Outpatient Medications  Medication Sig Dispense Refill   ALPRAZolam (XANAX) 0.25 MG tablet Take 0.125 mg by mouth daily as needed for anxiety.     aspirin EC 81 MG tablet Take 1 tablet (81 mg total) by mouth daily. Swallow  whole. 90 tablet 3   diltiazem (CARDIZEM CD) 120 MG 24 hr capsule Take 1 capsule (120 mg total) by mouth in the morning and at bedtime. 180 capsule 2   diltiazem (CARDIZEM) 60 MG tablet Take 0.5 tablets (30 mg total) by mouth as needed. (Patient taking differently: Take 30 mg by mouth daily as needed (SBP >150).) 90 tablet 3   Multiple Vitamins-Minerals (MULTIVITAMIN WITH MINERALS) tablet Take 1 tablet by mouth every 14 (fourteen) days.      irbesartan (AVAPRO) 150 MG tablet TAKE ONE-HALF (1/2) TABLET TWICE A DAY. KEEP UPCOMING  APPOINTMENT FOR FUTURE REFILLS. 90 tablet 3   No current facility-administered medications for this visit.    Allergies:   Patient has no known allergies.    Social History:  The patient  reports that she has never smoked. She has never used smokeless tobacco. She reports current alcohol use. She reports that she does not use drugs.   Family History:  The patient's family history includes Cancer in her brother, father, and mother; Stroke in her father.    ROS:  Please see the history of present illness.   Otherwise, review of systems are positive for none.   All other systems are reviewed and negative.    PHYSICAL EXAM: VS:  BP 138/82   Pulse 66   Ht 5' (1.524 m)   Wt 95 lb (43.1 kg)   SpO2 97%   BMI 18.55 kg/m  , BMI Body mass index is 18.55 kg/m. repeat BP 190/90. GENERAL:  Well appearing, elderly WF in NAD HEENT:  PERRL, EOMI, sclera are clear. Oropharynx is clear. NECK:  No jugular venous distention, carotid upstroke brisk and symmetric, no bruits, no thyromegaly or adenopathy LUNGS:  Clear to auscultation bilaterally CHEST:  Unremarkable HEART:  RRR,  PMI not displaced or sustained,S1 and S2 within normal limits, no S3, no S4: no clicks, no rubs, gr 2/6 systolic murmur RUSB>LSB ABD:  Soft, nontender. BS +, no masses or bruits. No hepatomegaly, no splenomegaly EXT:  2 + pulses throughout, no edema, no cyanosis no clubbing SKIN:  Warm and dry.  No rashes NEURO:  Alert and oriented x 3. Cranial nerves II through XII intact. PSYCH:  Cognitively intact   EKG:  EKG is  today.  NSR rate 66. LAD, RBBB. I have personally reviewed and interpreted this study.    Recent Labs: No results found for requested labs within last 365 days.    Lipid Panel    Component Value Date/Time   CHOL 212 (H) 11/16/2019 0545   CHOL 215 (H) 11/04/2019 1545   TRIG 57 11/16/2019 0545   HDL 71 11/16/2019 0545   HDL 72 11/04/2019 1545   CHOLHDL 3.0 11/16/2019 0545   VLDL 11 11/16/2019 0545    LDLCALC 130 (H) 11/16/2019 0545   LDLCALC 123 (H) 11/04/2019 1545   LDLDIRECT 124.5 (H) 11/16/2019 0545   Dated 12/13/17: cholesterol 204, triglycerides 84, HDL 62, LDL 125.  Dated 03/27/20: cholesterol 190, triglycerides 74, HDL 68, LDL 108. CMET normal. Dated 05/25/21: cholesterol 215, triglycerides 93, HDL 75, LDL 123. CMET normal  Wt Readings from Last 3 Encounters:  11/04/21 95 lb (43.1 kg)  10/28/20 99 lb 12.8 oz (45.3 kg)  11/16/19 96 lb 12.5 oz (43.9 kg)      Other studies Reviewed:  -- 2D ECHO (05/08/14) EF 55-60%, no RWMA, G2DD, s/p biological Bentall procedure w/ total large replacement of a large arch aneurysm. Mean AV grad WNL for bioprosthesis.  No AR. Mod MR thickening. L pleural effusion.    Echo 02/08/17: Study Conclusions   - Left ventricle: The cavity size was normal. Wall thickness was   increased in a pattern of mild LVH. Systolic function was normal.   The estimated ejection fraction was in the range of 60% to 65%.   Left ventricular diastolic function parameters were normal. - Aortic valve: Aoritc sinuses appear somewhat dilated above AVR   Post Bental AV leaflets not well seen mild gradient Root not well   visualized no AR Valve area (VTI): 1.49 cm^2. Valve area (Vmax):   1.37 cm^2. Valve area (Vmean): 1.3 cm^2. - Mitral valve: Calcified annulus. Mildly thickened leaflets . - Atrial septum: No defect or patent foramen ovale was identified  ADDENDUM: Dr. Erlinda Hong called to discuss the patient's thoracic aortic aneurysm. The luminal flap at the isthmus is contiguous with anastomosis between the prosthetic arch and the native aorta and the flap is attributed to graft material (elephant trunk). In this system the aorta has not been imaged since before her repair and the patient is presumably under the care of a cardiovascular surgeon. The aneurysmal descending aorta has increased size since a 2016 comparison, 4.2 cm today compared to 3.5 cm ~6 years ago.      Electronically Signed   By: Monte Fantasia M.D.   On: 11/17/2019 09:09    Addended by Gilford Silvius, MD on 11/17/2019  9:11 AM   Study Result  Narrative & Impression  CLINICAL DATA:  Stroke/TIA.   EXAM: CT ANGIOGRAPHY NECK   TECHNIQUE: Multidetector CT imaging of the neck was performed using the standard protocol during bolus administration of intravenous contrast. Multiplanar CT image reconstructions and MIPs were obtained to evaluate the vascular anatomy. Carotid stenosis measurements (when applicable) are obtained utilizing NASCET criteria, using the distal internal carotid diameter as the denominator.   CONTRAST:  30mL OMNIPAQUE IOHEXOL 300 MG/ML  SOLN   COMPARISON:  None similar   FINDINGS: Arteries: Aneurysmal descending aorta, measuring up to 4.2 cm on axial slices. There has been an ascending and arch repair with great vessel reimplantation, ostia not covered. Where covered, the great vessels are widely patent. There is ICA tortuosity and right more than left kinking without atheromatous stenosis. The left vertebral artery arises from the left subclavian artery which appears native and less intensely enhancing than the right. No superimposed stenosis is noted. The left vertebral artery is progressively dense towards the basilar and possibly flowing retrograde.   Skeleton: No acute or aggressive finding. Diffuse disc narrowing and endplate degeneration with degenerative sclerosis anteriorly at T1-2 there is disc collapse.   Other neck: No evidence of inflammation or mass.   Upper chest: Aneurysmal aorta is noted above.   IMPRESSION: 1. Aneurysmal thoracic aorta with ascending and arch repair and great vessel reimplantation (which is not visualized). The left subclavian has a native position with graft covering the ostium and asymmetrically faint enhancement. There could be left vertebral retrograde flow. 2. No significant atherosclerosis in the neck.    Electronically Signed: By: Monte Fantasia M.D. On: 11/16/2019 13:16   Echo 11/16/19: IMPRESSIONS     1. Left ventricular ejection fraction, by estimation, is 60 to 65%. The  left ventricle has normal function. The left ventricle has no regional  wall motion abnormalities. There is moderate left ventricular hypertrophy.  Left ventricular diastolic  parameters are consistent with Grade II diastolic dysfunction  (pseudonormalization). Elevated left atrial pressure.   2. Right  ventricular systolic function is normal. The right ventricular  size is normal. There is normal pulmonary artery systolic pressure. The  estimated right ventricular systolic pressure is 26.3 mmHg.   3. Left atrial size was moderately dilated.   4. Right atrial size was mildly dilated.   5. The mitral valve was not well visualized. Trivial mitral valve  regurgitation.   6. Tricuspid valve regurgitation is mild to moderate.   7. The aortic valve has been repaired/replaced. Aortic valve  regurgitation is not visualized. Mild aortic valve stenosis. Vmax 2.6 m/s,  MG 12 mmHg, AVA 1.4 cm^2, DI 0.34   8. The inferior vena cava is normal in size with greater than 50%  respiratory variability, suggesting right atrial pressure of 3 mmHg.   9. 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 S/p Bentall procedure, prosthetic aortic valve not well visualized.  Given presentation with CVA, would recommend TEE for further evaluation   ASSESSMENT AND PLAN:  1. HTN -BP is labile. Large anxiety component.  Continue Avapro and Cardizem as prescribed.   2. 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  Continue ASA.   3. HLD- patient is not on statin. Refuses to take.   4. Mild LV dysfunction EF 45%-  resolved on last ECHO. EF now 60%.  5. Ascending aortic aneurysm s/p extensive Bental procedure with tissue AVR 2016 -- 2D ECHO in November 2018 with normal LVF with stable AVR and ascending aortic  replacement -- CT done September 2021 as noted above - Echo in Sept 2021 stable.   6.  CVA 11/18. Recommend she take ASA 81 mg daily.,  7. Anxiety.   8. Right central retinal artery occlusion. September 2021.    Current medicines are reviewed at length with the patient today.  The patient does not have concerns regarding medicines.  The following changes have been made:  See above  Labs/ tests ordered today include: none    Disposition:   FU in one year.    Signed, Kamin Niblack Martinique, MD  11/04/2021 4:43 PM    [place]

## 2021-11-04 ENCOUNTER — Encounter: Payer: Self-pay | Admitting: Cardiology

## 2021-11-04 ENCOUNTER — Ambulatory Visit (INDEPENDENT_AMBULATORY_CARE_PROVIDER_SITE_OTHER): Payer: 59 | Admitting: Cardiology

## 2021-11-04 VITALS — BP 138/82 | HR 66 | Ht 60.0 in | Wt 95.0 lb

## 2021-11-04 DIAGNOSIS — I7121 Aneurysm of the ascending aorta, without rupture: Secondary | ICD-10-CM | POA: Diagnosis not present

## 2021-11-04 DIAGNOSIS — I10 Essential (primary) hypertension: Secondary | ICD-10-CM

## 2021-11-04 DIAGNOSIS — Z952 Presence of prosthetic heart valve: Secondary | ICD-10-CM

## 2021-11-04 MED ORDER — ASPIRIN 81 MG PO TBEC: 81.0000 mg | DELAYED_RELEASE_TABLET | Freq: Every day | ORAL | 3 refills | Status: DC

## 2021-11-04 MED ORDER — IRBESARTAN 150 MG PO TABS: ORAL_TABLET | ORAL | 3 refills | Status: DC

## 2021-11-26 ENCOUNTER — Other Ambulatory Visit: Payer: Self-pay | Admitting: Cardiology

## 2021-12-13 ENCOUNTER — Other Ambulatory Visit: Payer: Self-pay

## 2021-12-13 MED ORDER — DILTIAZEM HCL 60 MG PO TABS: 30.0000 mg | ORAL_TABLET | ORAL | 3 refills | Status: DC | PRN

## 2021-12-13 MED ORDER — DILTIAZEM HCL ER COATED BEADS 120 MG PO CP24: 120.0000 mg | ORAL_CAPSULE | Freq: Two times a day (BID) | ORAL | 2 refills | Status: DC

## 2022-03-14 ENCOUNTER — Encounter (HOSPITAL_COMMUNITY): Payer: Self-pay

## 2022-03-14 ENCOUNTER — Emergency Department (HOSPITAL_COMMUNITY): Payer: Medicare Other

## 2022-03-14 ENCOUNTER — Other Ambulatory Visit: Payer: Self-pay

## 2022-03-14 ENCOUNTER — Encounter (HOSPITAL_COMMUNITY): Payer: 59

## 2022-03-14 ENCOUNTER — Observation Stay (HOSPITAL_COMMUNITY): Payer: Medicare Other

## 2022-03-14 ENCOUNTER — Inpatient Hospital Stay (HOSPITAL_COMMUNITY)
Admission: EM | Admit: 2022-03-14 | Discharge: 2022-03-18 | DRG: 064 | Disposition: A | Discharge status: Skilled Nursing Facility | Payer: Medicare Other | Attending: Internal Medicine | Admitting: Internal Medicine

## 2022-03-14 DIAGNOSIS — Z681 Body mass index (BMI) 19 or less, adult: Secondary | ICD-10-CM

## 2022-03-14 DIAGNOSIS — E876 Hypokalemia: Secondary | ICD-10-CM | POA: Diagnosis present

## 2022-03-14 DIAGNOSIS — F419 Anxiety disorder, unspecified: Secondary | ICD-10-CM | POA: Diagnosis present

## 2022-03-14 DIAGNOSIS — E871 Hypo-osmolality and hyponatremia: Secondary | ICD-10-CM | POA: Diagnosis present

## 2022-03-14 DIAGNOSIS — I10 Essential (primary) hypertension: Secondary | ICD-10-CM | POA: Diagnosis present

## 2022-03-14 DIAGNOSIS — M353 Polymyalgia rheumatica: Secondary | ICD-10-CM | POA: Diagnosis present

## 2022-03-14 DIAGNOSIS — Z953 Presence of xenogenic heart valve: Secondary | ICD-10-CM

## 2022-03-14 DIAGNOSIS — I639 Cerebral infarction, unspecified: Secondary | ICD-10-CM | POA: Diagnosis not present

## 2022-03-14 DIAGNOSIS — Z66 Do not resuscitate: Secondary | ICD-10-CM | POA: Diagnosis present

## 2022-03-14 DIAGNOSIS — R29810 Facial weakness: Secondary | ICD-10-CM | POA: Diagnosis present

## 2022-03-14 DIAGNOSIS — Z823 Family history of stroke: Secondary | ICD-10-CM

## 2022-03-14 DIAGNOSIS — G8194 Hemiplegia, unspecified affecting left nondominant side: Secondary | ICD-10-CM | POA: Diagnosis present

## 2022-03-14 DIAGNOSIS — Z8673 Personal history of transient ischemic attack (TIA), and cerebral infarction without residual deficits: Secondary | ICD-10-CM

## 2022-03-14 DIAGNOSIS — Z79899 Other long term (current) drug therapy: Secondary | ICD-10-CM

## 2022-03-14 DIAGNOSIS — Z9851 Tubal ligation status: Secondary | ICD-10-CM

## 2022-03-14 DIAGNOSIS — R000 Tachycardia, unspecified: Secondary | ICD-10-CM | POA: Diagnosis present

## 2022-03-14 DIAGNOSIS — I6381 Other cerebral infarction due to occlusion or stenosis of small artery: Secondary | ICD-10-CM | POA: Diagnosis not present

## 2022-03-14 DIAGNOSIS — Z7982 Long term (current) use of aspirin: Secondary | ICD-10-CM

## 2022-03-14 DIAGNOSIS — Z8679 Personal history of other diseases of the circulatory system: Secondary | ICD-10-CM

## 2022-03-14 DIAGNOSIS — I5022 Chronic systolic (congestive) heart failure: Secondary | ICD-10-CM | POA: Diagnosis present

## 2022-03-14 DIAGNOSIS — I4891 Unspecified atrial fibrillation: Secondary | ICD-10-CM | POA: Diagnosis present

## 2022-03-14 DIAGNOSIS — Z952 Presence of prosthetic heart valve: Secondary | ICD-10-CM

## 2022-03-14 DIAGNOSIS — D649 Anemia, unspecified: Secondary | ICD-10-CM | POA: Diagnosis present

## 2022-03-14 DIAGNOSIS — E43 Unspecified severe protein-calorie malnutrition: Secondary | ICD-10-CM | POA: Diagnosis present

## 2022-03-14 DIAGNOSIS — I2510 Atherosclerotic heart disease of native coronary artery without angina pectoris: Secondary | ICD-10-CM | POA: Diagnosis present

## 2022-03-14 DIAGNOSIS — E785 Hyperlipidemia, unspecified: Secondary | ICD-10-CM | POA: Diagnosis present

## 2022-03-14 DIAGNOSIS — I110 Hypertensive heart disease with heart failure: Secondary | ICD-10-CM | POA: Diagnosis present

## 2022-03-14 DIAGNOSIS — G250 Essential tremor: Secondary | ICD-10-CM | POA: Diagnosis present

## 2022-03-14 HISTORY — DX: Cerebral infarction, unspecified: I63.9

## 2022-03-14 LAB — I-STAT CHEM 8, ED
BUN: 11 mg/dL (ref 8–23)
Calcium, Ion: 1.07 mmol/L — ABNORMAL LOW (ref 1.15–1.40)
Chloride: 101 mmol/L (ref 98–111)
Creatinine, Ser: 0.6 mg/dL (ref 0.44–1.00)
Glucose, Bld: 121 mg/dL — ABNORMAL HIGH (ref 70–99)
HCT: 33 % — ABNORMAL LOW (ref 36.0–46.0)
Hemoglobin: 11.2 g/dL — ABNORMAL LOW (ref 12.0–15.0)
Potassium: 3.1 mmol/L — ABNORMAL LOW (ref 3.5–5.1)
Sodium: 139 mmol/L (ref 135–145)
TCO2: 27 mmol/L (ref 22–32)

## 2022-03-14 LAB — COMPREHENSIVE METABOLIC PANEL
ALT: 17 U/L (ref 0–44)
AST: 26 U/L (ref 15–41)
Albumin: 3.7 g/dL (ref 3.5–5.0)
Alkaline Phosphatase: 63 U/L (ref 38–126)
Anion gap: 10 (ref 5–15)
BUN: 11 mg/dL (ref 8–23)
CO2: 25 mmol/L (ref 22–32)
Calcium: 8.6 mg/dL — ABNORMAL LOW (ref 8.9–10.3)
Chloride: 102 mmol/L (ref 98–111)
Creatinine, Ser: 0.69 mg/dL (ref 0.44–1.00)
GFR, Estimated: >60 mL/min (ref >60)
Glucose, Bld: 123 mg/dL — ABNORMAL HIGH (ref 70–99)
Potassium: 3.1 mmol/L — ABNORMAL LOW (ref 3.5–5.1)
Sodium: 137 mmol/L (ref 135–145)
Total Bilirubin: 0.6 mg/dL (ref 0.3–1.2)
Total Protein: 6.1 g/dL — ABNORMAL LOW (ref 6.5–8.1)

## 2022-03-14 LAB — CBC
HCT: 32.7 % — ABNORMAL LOW (ref 36.0–46.0)
Hemoglobin: 10.9 g/dL — ABNORMAL LOW (ref 12.0–15.0)
MCH: 30.4 pg (ref 26.0–34.0)
MCHC: 33.3 g/dL (ref 30.0–36.0)
MCV: 91.1 fL (ref 80.0–100.0)
Platelets: 169 10*3/uL (ref 150–400)
RBC: 3.59 MIL/uL — ABNORMAL LOW (ref 3.87–5.11)
RDW: 13.8 % (ref 11.5–15.5)
WBC: 5.2 10*3/uL (ref 4.0–10.5)
nRBC: 0 % (ref 0.0–0.2)

## 2022-03-14 LAB — DIFFERENTIAL
Abs Immature Granulocytes: 0.02 10*3/uL (ref 0.00–0.07)
Basophils Absolute: 0 10*3/uL (ref 0.0–0.1)
Basophils Relative: 1 %
Eosinophils Absolute: 0.1 10*3/uL (ref 0.0–0.5)
Eosinophils Relative: 2 %
Immature Granulocytes: 0 %
Lymphocytes Relative: 43 %
Lymphs Abs: 2.3 10*3/uL (ref 0.7–4.0)
Monocytes Absolute: 0.3 10*3/uL (ref 0.1–1.0)
Monocytes Relative: 6 %
Neutro Abs: 2.5 10*3/uL (ref 1.7–7.7)
Neutrophils Relative %: 48 %

## 2022-03-14 LAB — SEDIMENTATION RATE: Sed Rate: 7 mm/hr (ref 0–22)

## 2022-03-14 LAB — ETHANOL: Alcohol, Ethyl (B): <10 mg/dL (ref <10)

## 2022-03-14 LAB — CBG MONITORING, ED: Glucose-Capillary: 118 mg/dL — ABNORMAL HIGH (ref 70–99)

## 2022-03-14 LAB — PROTIME-INR
INR: 1.1 (ref 0.8–1.2)
Prothrombin Time: 13.8 seconds (ref 11.4–15.2)

## 2022-03-14 LAB — C-REACTIVE PROTEIN: CRP: 0.8 mg/dL (ref <1.0)

## 2022-03-14 LAB — APTT: aPTT: 32 seconds (ref 24–36)

## 2022-03-14 MED ORDER — ACETAMINOPHEN 325 MG PO TABS: 650.0000 mg | ORAL_TABLET | ORAL | Status: DC | PRN

## 2022-03-14 MED ORDER — ACETAMINOPHEN 160 MG/5ML PO SOLN: 650.0000 mg | ORAL | Status: DC | PRN

## 2022-03-14 MED ORDER — LORAZEPAM 2 MG/ML IJ SOLN
0.5000 mg | Freq: Once | INTRAMUSCULAR | Status: AC
  Administered 2022-03-14: 0.5 mg via INTRAVENOUS
  Filled 2022-03-14 (×2): qty 1

## 2022-03-14 MED ORDER — ALPRAZOLAM 0.25 MG PO TABS: 0.1250 mg | ORAL_TABLET | Freq: Every day | ORAL | Status: DC | PRN

## 2022-03-14 MED ORDER — ASPIRIN 325 MG PO TABS
325.0000 mg | ORAL_TABLET | Freq: Every day | ORAL | Status: DC
  Administered 2022-03-14 – 2022-03-18 (×5): 325 mg via ORAL
  Filled 2022-03-14 (×5): qty 1

## 2022-03-14 MED ORDER — POTASSIUM CHLORIDE CRYS ER 20 MEQ PO TBCR
40.0000 meq | EXTENDED_RELEASE_TABLET | Freq: Once | ORAL | Status: AC
  Administered 2022-03-14: 40 meq via ORAL
  Filled 2022-03-14: qty 2

## 2022-03-14 MED ORDER — LORAZEPAM 2 MG/ML IJ SOLN: 0.5000 mg | Freq: Once | INTRAMUSCULAR | Status: DC | PRN

## 2022-03-14 MED ORDER — LORAZEPAM 2 MG/ML IJ SOLN: 1.0000 mg | Freq: Once | INTRAMUSCULAR | Status: DC | PRN

## 2022-03-14 MED ORDER — STROKE: EARLY STAGES OF RECOVERY BOOK: Freq: Once | Status: AC

## 2022-03-14 MED ORDER — ENOXAPARIN SODIUM 30 MG/0.3ML IJ SOSY
30.0000 mg | PREFILLED_SYRINGE | INTRAMUSCULAR | Status: DC
  Administered 2022-03-14 – 2022-03-16 (×3): 30 mg via SUBCUTANEOUS
  Filled 2022-03-14 (×4): qty 0.3

## 2022-03-14 MED ORDER — SENNOSIDES-DOCUSATE SODIUM 8.6-50 MG PO TABS: 1.0000 | ORAL_TABLET | Freq: Every evening | ORAL | Status: DC | PRN

## 2022-03-14 MED ORDER — ASPIRIN 81 MG PO TBEC: 81.0000 mg | DELAYED_RELEASE_TABLET | Freq: Every day | ORAL | Status: DC

## 2022-03-14 MED ORDER — HYDRALAZINE HCL 20 MG/ML IJ SOLN
5.0000 mg | Freq: Four times a day (QID) | INTRAMUSCULAR | Status: DC | PRN
  Administered 2022-03-15: 5 mg via INTRAVENOUS
  Filled 2022-03-14: qty 1

## 2022-03-14 MED ORDER — SODIUM CHLORIDE 0.9 % IV SOLN: INTRAVENOUS | Status: AC

## 2022-03-14 MED ORDER — ACETAMINOPHEN 650 MG RE SUPP: 650.0000 mg | RECTAL | Status: DC | PRN

## 2022-03-14 MED ORDER — CLOPIDOGREL BISULFATE 75 MG PO TABS
75.0000 mg | ORAL_TABLET | Freq: Every day | ORAL | Status: DC
  Administered 2022-03-14 – 2022-03-18 (×5): 75 mg via ORAL
  Filled 2022-03-14 (×5): qty 1

## 2022-03-14 MED ORDER — SODIUM CHLORIDE 0.9 % IV BOLUS: 1000.0000 mL | INTRAVENOUS | Status: DC

## 2022-03-14 MED ORDER — SODIUM CHLORIDE 0.9% FLUSH
3.0000 mL | Freq: Once | INTRAVENOUS | Status: AC
  Administered 2022-03-14: 3 mL via INTRAVENOUS

## 2022-03-14 NOTE — ED Triage Notes (Signed)
Pt bib [facility] from [place] where she is in independent living. Pt woke up at 3am to go to the bathroom with no issues, pt then woke up at 9am with Left leg and arm weakness. Pt was unable to control her left arm when she was trying to make coffee after waking up. Pt also has facial droop and right sided twitching. Pt normally has some twitching on both sides.  Pt arrives AOx4 and daughter at bedside EMS vitals: 180/110, 99%RA, 60-110 controlled afib  Pt has hx of afib but does not take thinners. Pt also has hx of right eye problems.

## 2022-03-14 NOTE — ED Provider Notes (Signed)
[place] EMERGENCY DEPARTMENT Provider Note   CSN: [id number] Arrival date & time: 03/14/22  1020  An emergency department physician performed an initial assessment on this suspected stroke patient at 1020.  History  No chief complaint on file.   Yvonne Ballard is a 82 y.o. female.  HPI 82 year old female last known normal at 3 AM presents today with right-sided weakness.  She states she went to bed around 11 but woke up around 3:00 went to the bathroom.  She then awoke again at 9 AM and as soon as she went to the left relaxing her left leg was weak.  She presented to the hospital as a code stroke.     Home Medications Prior to Admission medications   Medication Sig Start Date End Date Taking? Authorizing Provider  ALPRAZolam (XANAX) 0.25 MG tablet Take 0.125 mg by mouth daily as needed for anxiety.    [provider]  aspirin EC 81 MG tablet Take 1 tablet (81 mg total) by mouth daily. Swallow whole. 11/04/21   Martinique, Peter M, MD  diltiazem (CARDIZEM CD) 120 MG 24 hr capsule Take 1 capsule (120 mg total) by mouth in the morning and at bedtime. 12/13/21   Martinique, Peter M, MD  diltiazem (CARDIZEM) 60 MG tablet Take 0.5 tablets (30 mg total) by mouth as needed. 12/13/21   Martinique, Peter M, MD  irbesartan (AVAPRO) 150 MG tablet TAKE 1/2 TABLET TWICE A DAY. 11/26/21   Martinique, Peter M, MD  Multiple Vitamins-Minerals (MULTIVITAMIN WITH MINERALS) tablet Take 1 tablet by mouth every 14 (fourteen) days.     [provider]      Allergies    Patient has no known allergies.    Review of Systems   Review of Systems  Physical Exam Updated Vital Signs BP 128/87   Pulse 99   Temp 98.8 F (37.1 C) (Oral)   Resp 12   Wt 43.2 kg   SpO2 99%   BMI 18.60 kg/m  Physical Exam Vitals and nursing note reviewed.  Constitutional:      Appearance: Normal appearance.  HENT:     Head: Normocephalic.     Right Ear: External ear normal.     Left Ear: External  ear normal.     Mouth/Throat:     Pharynx: Oropharynx is clear.  Eyes:     Extraocular Movements: Extraocular movements intact.     Pupils: Pupils are equal, round, and reactive to light.  Cardiovascular:     Rate and Rhythm: Normal rate.  Pulmonary:     Effort: Pulmonary effort is normal.     Breath sounds: Normal breath sounds.  Abdominal:     General: Abdomen is flat. Bowel sounds are normal.  Musculoskeletal:        General: Normal range of motion.     Cervical back: Normal range of motion.  Skin:    General: Skin is warm.     Capillary Refill: Capillary refill takes less than 2 seconds.  Neurological:     Mental Status: She is alert.     Comments: Left facial droop and left arm drift There is no leg drift or leg weakness noted on my exam  Psychiatric:        Mood and Affect: Mood normal.        Behavior: Behavior normal.     ED Results / Procedures / Treatments   Labs (all labs ordered are listed, but only abnormal results are displayed) Labs  Reviewed  CBC - Abnormal; Notable for the following components:      Result Value   RBC 3.59 (*)    Hemoglobin 10.9 (*)    HCT 32.7 (*)    All other components within normal limits  COMPREHENSIVE METABOLIC PANEL - Abnormal; Notable for the following components:   Potassium 3.1 (*)    Glucose, Bld 123 (*)    Calcium 8.6 (*)    Total Protein 6.1 (*)    All other components within normal limits  I-STAT CHEM 8, ED - Abnormal; Notable for the following components:   Potassium 3.1 (*)    Glucose, Bld 121 (*)    Calcium, Ion 1.07 (*)    Hemoglobin 11.2 (*)    HCT 33.0 (*)    All other components within normal limits  CBG MONITORING, ED - Abnormal; Notable for the following components:   Glucose-Capillary 118 (*)    All other components within normal limits  PROTIME-INR  APTT  DIFFERENTIAL  ETHANOL  SEDIMENTATION RATE  C-REACTIVE PROTEIN    EKG EKG Interpretation  Date/Time:  Monday March 14 2022 10:58:40  EST Ventricular Rate:  65 PR Interval:  212 QRS Duration: 147 QT Interval:  465 QTC Calculation: 484 R Axis:   -30 Text Interpretation: Unknown rhythm, irregular rate Right bundle branch block Confirmed by Pattricia Boss [phone]) on 03/14/2022 11:10:53 AM  Radiology CT HEAD CODE STROKE WO CONTRAST  Result Date: 03/14/2022 CLINICAL DATA:  Code stroke.  82 year old female right side deficit. EXAM: CT HEAD WITHOUT CONTRAST TECHNIQUE: Contiguous axial images were obtained from the base of the skull through the vertex without intravenous contrast. RADIATION DOSE REDUCTION: This exam was performed according to the departmental dose-optimization program which includes automated exposure control, adjustment of the mA and/or kV according to patient size and/or use of iterative reconstruction technique. COMPARISON:  Brain MRI 11/15/2019. Head CT 04/14/2018. CTA neck 11/16/2019. FINDINGS: Brain: Cerebral volume remains normal for age. No midline shift, mass effect, or evidence of intracranial mass lesion. No acute intracranial hemorrhage identified. No ventriculomegaly. Patchy and confluent bilateral cerebral white matter hypodensity, with chronic asymmetric involvement of the right corona radiata and lentiform, appears stable. No cortically based acute infarct identified. Vascular: Calcified atherosclerosis at the skull base. No suspicious intracranial vascular hyperdensity. Skull: No acute osseous abnormality identified. Sinuses/Orbits: Visualized paranasal sinuses and mastoids are stable and well aerated. Other: No acute orbit or scalp soft tissue finding. ASPECTS [facility] Stroke Program Early CT Score) Total score (0-10 with 10 being normal): 10 IMPRESSION: 1. No acute cortically based infarct or acute intracranial hemorrhage identified. ASPECTS 10. 2. Stable since 2021 non contrast CT appearance of chronic small vessel disease. 3. These results were communicated to Dr. Curly Shores at 10:37 am on 03/14/2022 by text page  via the [facility] messaging system. Electronically Signed   By: Genevie Ann M.D.   On: 03/14/2022 10:38    Procedures .Critical Care  Performed by: Pattricia Boss, MD Authorized by: Pattricia Boss, MD   Critical care provider statement:    Critical care time (minutes):  45   Critical care was time spent personally by me on the following activities:  Development of treatment plan with patient or surrogate, discussions with consultants, evaluation of patient's response to treatment, examination of patient, ordering and review of laboratory studies, ordering and review of radiographic studies, ordering and performing treatments and interventions, pulse oximetry, re-evaluation of patient's condition and review of old charts     Medications Ordered in  ED Medications  sodium chloride flush (NS) 0.9 % injection 3 mL (3 mLs Intravenous Given 03/14/22 1120)  LORazepam (ATIVAN) injection 0.5 mg (0.5 mg Intravenous Given 03/14/22 1246)    ED Course/ Medical Decision Making/ A&P Clinical Course as of 03/14/22 1253  Mon Mar 14, 2022  1610 Complete metabolic panel reviewed and interpreted significant for mild hypokalemia with potassium of 3.1 [DR]  1251 CBC reviewed and interpreted significant for mild anemia with hemoglobin 10.9 which is slightly decreased from first prior [DR]  1251 CT reviewed interpreted Lenze acute abnormality noted [DR]    Clinical Course User Index [DR] Pattricia Boss, MD                           Medical Decision Making 82 year old female presents today with new onset of left leg weakness. Patient initiated as code stroke and head CT performed rapidly. Patient refused CT angiogram Patient outside of TNK window Neurology advises that they will further evaluate with MR MRI and carotid ultrasounds Request admission to hospitalist team Consult added to medicine Discussed care with Dr. Roosevelt Locks who will see for admission  Amount and/or Complexity of Data Reviewed Labs: ordered.  Decision-making details documented in ED Course. Radiology: ordered and independent interpretation performed. Decision-making details documented in ED Course. ECG/medicine tests: ordered and independent interpretation performed. Decision-making details documented in ED Course. Discussion of management or test interpretation with external provider(s): 1 acute stroke with left-sided weakness 2 mild hypokalemia 3 mild anemia  Risk Prescription drug management. Decision regarding hospitalization.   Neurology saw and evaluated.  Noted to be in a fib. CT head no acute abnormalities Refused cta Plan further evaluation with mr, mra, carotid US- nueurology is entering.        Final Clinical Impression(s) / ED Diagnoses Final diagnoses:  Cerebrovascular accident (CVA), unspecified mechanism ([place])    Rx / DC Orders ED Discharge Orders     None         Pattricia Boss, MD 03/14/22 1253

## 2022-03-14 NOTE — Consult Note (Addendum)
Neurology Consultation  Reason for Consult: Code Stroke  Referring Physician: Ray  CC: Left-sided weakness History is obtained from: Patient, daughter, chart review  HPI: Yvonne Ballard is a 82 y.o. female with a past medical history of anxiety, thoracic ascending aortic aneurysm, HTN, CAD, and HLD who is presenting with left sided weakness and left facial droop.  She reports that she went to sleep last night in her usual state of health and then woke up around 3 AM to use the bathroom.  She did not notice anything abnormal at that time.  She got up at 9 AM to make coffee and noticed that her left leg was weak and she ended up falling to the ground.  She denies hitting her head.  Blood pressure on arrival was 169/105, Glucose was 118.  Her daughter tells me that she has tardive dyskinesia movements at baseline from a medication reaction.  On arrival to the ED these movements are only happening in the right upper and lower extremity.  There are no movements noted on the left side.  She does have a history of postoperative A-fib documented in 2016, however she has never been on anticoagulation and follow-up EKGs since that time have shown normal sinus rhythm.  Per discharge summary from 04/07/2014 (when she was admitted for bio Bentall procedure) she was not started on anticoagulation due to the surgery and increased risk of pericardial effusion.  There is some concern for atrial fibrillation on telemetry monitoring, but appears to be sinus arrhythmia on EKG  LKW: 0300 tpa given?: no, outside of window Premorbid modified Rankin scale (mRS): 2 -ambulates with a cane, lives in an independent living facility   ROS: Full ROS was performed and is negative except as noted in the HPI.   Past Medical History:  Diagnosis Date   Abnormal ECG    Anxiety    pt. reports that she is nervous person   Anxiety disorder    Ascending aortic aneurysm [facility])    s/p repair with Bental procedure   Coronary artery  disease 03/2014   mild non obstructive CAD   Dyslipidemia, goal LDL below 70 07/26/2014   Family history of adverse reaction to anesthesia    Heart murmur    History of hiatal hernia    Hypertension    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HCC)    S/P AVR (aortic valve replacement)    tissue valve   Weight loss    Family History  Problem Relation Age of Onset   Cancer Mother    Cancer Father    Stroke Father    Cancer Brother    Social History:   reports that she has never smoked. She has never used smokeless tobacco. She reports current alcohol use. She reports that she does not use drugs.  Medications  Current Facility-Administered Medications:    LORazepam (ATIVAN) injection 0.5 mg, 0.5 mg, Intravenous, Once, Ray, Andee Poles, MD   sodium chloride flush (NS) 0.9 % injection 3 mL, 3 mL, Intravenous, Once, Pattricia Boss, MD  Current Outpatient Medications:    ALPRAZolam (XANAX) 0.25 MG tablet, Take 0.125 mg by mouth daily as needed for anxiety., Disp: , Rfl:    aspirin EC 81 MG tablet, Take 1 tablet (81 mg total) by mouth daily. Swallow whole., Disp: 90 tablet, Rfl: 3   diltiazem (CARDIZEM CD) 120 MG 24 hr capsule, Take 1 capsule (120 mg total) by mouth in the morning and at bedtime., Disp: 180 capsule, Rfl: 2   diltiazem (  CARDIZEM) 60 MG tablet, Take 0.5 tablets (30 mg total) by mouth as needed., Disp: 90 tablet, Rfl: 3   irbesartan (AVAPRO) 150 MG tablet, TAKE 1/2 TABLET TWICE A DAY., Disp: 90 tablet, Rfl: 3   Multiple Vitamins-Minerals (MULTIVITAMIN WITH MINERALS) tablet, Take 1 tablet by mouth every 14 (fourteen) days. , Disp: , Rfl:    Exam: Current vital signs: Wt 43.2 kg   BMI 18.60 kg/m  Vital signs in last 24 hours: Weight:  [43.2 kg] 43.2 kg (01/01 1027)  GENERAL: Awake, alert in NAD HEENT: - Normocephalic and atraumatic, dry mm, no LN++, no Thyromegally.  Frequently closing the right eye LUNGS - Clear to auscultation bilaterally with no wheezes CV -irregularly  irregular, perfusing extremities well ABDOMEN - Soft, nontender, nondistended with normoactive BS Ext: warm, well perfused, intact peripheral pulses, no edema  NEURO:  Mental Status: AA&Ox3  Language: speech is clear.  Naming, repetition, fluency, and comprehension intact. 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 No evidence of tongue atrophy or fasciculations Motor:  Left arm drift RUE 5/5 Strength in bilateral lower extremities is equal, no drift Tone: is normal and bulk is normal Sensation- Intact to light touch bilaterally Coordination: Rhythmic shaking of the right upper and lower extremity, occasionally with some right face twitching as well.  Baseline movements per family at bedside although these are usually bilateral until today Gait- deferred  NIHSS components Score: Comment  1a Level of Conscious 0[x]  1[]  2[]  3[]         1b LOC Questions 0[x]  1[]  2[]           1c LOC Commands 0[x]  1[]  2[]           2 Best Gaze 0[x]  1[]  2[]           3 Visual 0[]  1[]  2[]  3[]         4 Facial Palsy 0[]  1[]  2[x]  3[]         5a Motor Arm - left 0[]  1[x]  2[]  3[]  4[]  UN[]     5b Motor Arm - Right 0[x]  1[]  2[]  3[]  4[]  UN[]     6a Motor Leg - Left 0[x]  1[]  2[]  3[]  4[]  UN[]     6b Motor Leg - Right 0[x]  1[]  2[]  3[]  4[]  UN[]     7 Limb Ataxia 0[x]  1[]  2[]  3[]  UN[]       8 Sensory 0[x]  1[]  2[]  UN[]         9 Best Language 0[x]  1[]  2[]  3[]         10 Dysarthria 0[x]  1[]  2[]  UN[]         11 Extinct. and Inattention 0[x]  1[]  2[]           TOTAL: 3        Labs I have reviewed labs in epic and the results pertinent to this consultation are:   Basic Metabolic Panel: Recent Labs  Lab 03/14/22 1023 03/14/22 1041  NA 137 139  K 3.1* 3.1*  CL 102 101  CO2 25  --   GLUCOSE 123* 121*  BUN 11 11  CREATININE 0.69 0.60  CALCIUM 8.6*  --      CBC: Recent Labs  Lab 03/14/22 1023 03/14/22 1041  WBC 5.2  --   NEUTROABS 2.5  --   HGB 10.9* 11.2*  HCT 32.7* 33.0*  MCV 91.1  --   PLT 169  --     Coagulation Studies: Recent Labs  03/14/22 1023  LABPROT 13.8  INR 1.1    Lab Results  Component Value Date   CHOL 212 (H) 11/16/2019   HDL 71 11/16/2019   LDLCALC 130 (H) 11/16/2019   LDLDIRECT 124.5 (H) 11/16/2019   TRIG 57 11/16/2019   CHOLHDL 3.0 11/16/2019   Lab Results  Component Value Date   HGBA1C 5.4 11/15/2019       Imaging I have reviewed the images obtained:  CT-head  1. No acute cortically based infarct or acute intracranial hemorrhage identified. ASPECTS 10. 2. Stable since 2021 non contrast CT appearance of chronic small vessel disease.  MRI examination of the brain MRA Brain 1. Acute nonhemorrhagic infarct of the posterior limb right internal capsule measures up to 9 mm. 2. Remote infarct of the posterior right lentiform nucleus extending superiorly to the corona radiata, stable. 3. Extensive periventricular and subcortical white matter disease bilaterally is moderately advanced for age. This likely reflects the sequela of chronic microvascular ischemia. 4. The image was discontinued due to severe patient motion. MRA could not be completed at this time.  Assessment:  82 y.o. female with a past medical history of anxiety, thoracic ascending aortic aneurysm, HTN, CAD, and HLD who is presenting with left sided weakness and left facial droop as well as absence of tardive dyskinesia movements in the left side of her body.  Patient declined CT angio due to contrast, she is agreeable to an MRI with an MRA and carotid ultrasound.  However, given that she did not tolerate MRA, we subsequently reordered CTA head and neck with Ativan as needed for anxiety.  If patient refuses this exam, would obtain carotid duplex as a minimum  MRI brain reveals an acute small vessel infarct; cannot rule out  embolic etiology given she is found to be in atrial fibrillation this admission  Impression: Acute ischemic infarct  Possible atrial fibrillation  Recommendations: -MRI/MRA and carotid US (if she continues to refuse CTA, please order carotid ultrasound) -Stroke workup given MRI is positive (CTA head and neck given she failed MRA), echocardiogram, lipid panel, A1c, PT/OT/SLP evaluations, -Careful review of telemetry for concern of atrial fibrillation.  If no atrial fibrillation recommend dual antiplatelet therapy with aspirin and Plavix (300 mg loading dose of Plavix followed by 75 mg daily after she passes swallow evaluation, course to be determined pending vessel imaging).  If she does have atrial fibrillation, aspirin 325 daily until cleared to start anticoagulation from an abdominal aortic aneurysm perspective.  From a stroke perspective would wait until at least 1/3 to reduce risk of ICH -Statin for goal LDL less than 70 is indicated, however not ordered at this time given that she has refused it in the past -Stroke team to follow  Patient seen and examined by NP/APP with MD. MD to update note as needed.   Janine Ores, DNP, FNP-BC [facility] Pager: [phone]  Attending Neurologist's note:  I personally saw this patient, gathering history, performing a full neurologic examination, reviewing relevant labs, personally reviewing relevant imaging including head CT, MRI brain, and formulated the assessment and plan, adding the note above for completeness and clarity to accurately reflect my thoughts   Lesleigh Noe MD-PhD [facility] [phone]  CRITICAL CARE Performed by: Lorenza Chick   Total critical care time: 40 minutes  Critical care time was exclusive of separately billable procedures and treating other patients.  Critical care was necessary to treat or prevent imminent or life-threatening deterioration --emergent evaluation for  consideration of  thrombectomy or thrombolytic administration  Critical care was time spent personally by me on the following activities: development of treatment plan with patient and/or surrogate as well as nursing, discussions with consultants, evaluation of patient's response to treatment, examination of patient, obtaining history from patient or surrogate, ordering and performing treatments and interventions, ordering and review of laboratory studies, ordering and review of radiographic studies, pulse oximetry and re-evaluation of patient's condition.

## 2022-03-14 NOTE — Code Documentation (Signed)
Stroke Response Nurse Documentation Code Documentation  Kathlee Barnhardt is a 82 y.o. female arriving to [facility]  via [facility] on 03-14-2022 with past medical hx of HTN, AAA, CAD,. On aspirin 81 mg daily. Code stroke was activated by EMS.   Patient from independent living apartment where she was LKW at 0300 and now complaining of Left weakness.  She states that she was up to the bathroom at 0300 and had no issues.  At 0900 when she woke up she had weakness of her left arm and leg  Stroke team at the bedside on patient arrival. Labs drawn and patient cleared for CT by Dr. Jeanell Sparrow. Patient to CT with team. NIHSS 4, see documentation for details and code stroke times. Patient with left facial droop, left arm weakness, and dysarthria  on exam. The following imaging was completed:  CT Head. Patient is not a candidate for IV Thrombolytic due to being outside the window. Patient refused CTA.   Care Plan: mNIHSS and VS q 2 hour.   Bedside handoff with ED RN Lysbeth Galas.    Raliegh Ip  Stroke Response RN

## 2022-03-14 NOTE — H&P (Signed)
History and Physical    Yvonne Ballard WLN:[id number] DOB: 1940-08-16 DOA: 03/14/2022  PCP: Kelton Pillar, MD (Confirm with patient/family/NH records and if not entered, this has to be entered at [facility] point of entry) Patient coming from: [place] [facility]  I have personally briefly reviewed patient's old medical records in [place]  Chief Complaint: Left-sided weakness HPI: Yvonne Ballard is a 82 y.o. female with medical history significant of TIA, HTN, chronic HFrEF, aortic stenosis status post biosynthetic TAVR, ascending aortic aneurysm, nonobstructive CAD, presented with new onset of left-sided weakness.  Symptoms started this morning, patient woke up around 9 AM and found new onset of left-sided weakness including left arm and left leg, and staff found she also has a new onset of left-sided facial droop.  She was last known normal around 3 AM this morning.  History of TIA and hypertension on aspirin and Cardizem for BP control.  No recent medication changes  ED Course: Afebrile, none tachycardia nonhypotensive nonhypoxic.  CT head negative for acute findings, brain MRI showed no acute respiratory or limb of internal capsule stroke and chronic remote stroke.  Review of Systems: As per HPI otherwise 14 point review of systems negative.    Past Medical History:  Diagnosis Date   Abnormal ECG    Anxiety    pt. reports that she is nervous person   Anxiety disorder    Ascending aortic aneurysm [facility])    s/p repair with Bental procedure   Coronary artery disease 03/2014   mild non obstructive CAD   Dyslipidemia, goal LDL below 70 07/26/2014   Family history of adverse reaction to anesthesia    Heart murmur    History of hiatal hernia    Hypertension    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HCC)    S/P AVR (aortic valve replacement)    tissue valve   Weight loss     Past Surgical History:  Procedure Laterality Date   BENTALL PROCEDURE N/A 03/31/2014   Procedure: BIO-BENTALL  PROCEDURE using a 7mm Magna Ease Aortic Tissue Valve, a 26 mm Terumo Gelweave Valsalva Graft, a 14x10x62mm Terumo Gelweave Graft, and a 3mm straight Hemashield Platinum Graft.;  Surgeon: Ivin Poot, MD;  Location: [place];  Service: Open Heart Surgery;  Laterality: N/A;   LEFT AND RIGHT HEART CATHETERIZATION WITH CORONARY ANGIOGRAM N/A 03/26/2014   Procedure: LEFT AND RIGHT HEART CATHETERIZATION WITH CORONARY ANGIOGRAM;  Surgeon: Peter M Martinique, MD;  Location: [facility] CATH LAB;  Service: Cardiovascular;  Laterality: N/A;   TEE WITHOUT CARDIOVERSION N/A 03/31/2014   Procedure: TRANSESOPHAGEAL ECHOCARDIOGRAM (TEE);  Surgeon: Ivin Poot, MD;  Location: [place];  Service: Open Heart Surgery;  Laterality: N/A;   TONSILLECTOMY     TUBAL LIGATION       reports that she has never smoked. She has never used smokeless tobacco. She reports current alcohol use. She reports that she does not use drugs.  No Known Allergies  Family History  Problem Relation Age of Onset   Cancer Mother    Cancer Father    Stroke Father    Cancer Brother      Prior to Admission medications   Medication Sig Start Date End Date Taking? Authorizing Provider  ALPRAZolam (XANAX) 0.25 MG tablet Take 0.125 mg by mouth daily as needed for anxiety.    [provider]  aspirin EC 81 MG tablet Take 1 tablet (81 mg total) by mouth daily. Swallow whole. 11/04/21   Martinique, Peter M, MD  diltiazem (  CARDIZEM CD) 120 MG 24 hr capsule Take 1 capsule (120 mg total) by mouth in the morning and at bedtime. 12/13/21   Martinique, Peter M, MD  diltiazem (CARDIZEM) 60 MG tablet Take 0.5 tablets (30 mg total) by mouth as needed. 12/13/21   Martinique, Peter M, MD  irbesartan (AVAPRO) 150 MG tablet TAKE 1/2 TABLET TWICE A DAY. 11/26/21   Martinique, Peter M, MD  Multiple Vitamins-Minerals (MULTIVITAMIN WITH MINERALS) tablet Take 1 tablet by mouth every 14 (fourteen) days.     [provider]    Physical Exam: Vitals:   03/14/22 1130  03/14/22 1200 03/14/22 1230 03/14/22 1345  BP: 132/87 128/87 (!) 124/109 (!) 140/71  Pulse: (!) 56 99 62 (!) 56  Resp: 18 12 18 14   Temp:      TempSrc:      SpO2: 97% 99% 98% 99%  Weight:        Constitutional: NAD, calm, comfortable Vitals:   03/14/22 1130 03/14/22 1200 03/14/22 1230 03/14/22 1345  BP: 132/87 128/87 (!) 124/109 (!) 140/71  Pulse: (!) 56 99 62 (!) 56  Resp: 18 12 18 14   Temp:      TempSrc:      SpO2: 97% 99% 98% 99%  Weight:       Eyes: PERRL, lids and conjunctivae normal ENMT: Mucous membranes are moist. Posterior pharynx clear of any exudate or lesions.Normal dentition.  Neck: normal, supple, no masses, no thyromegaly Respiratory: clear to auscultation bilaterally, no wheezing, no crackles. Normal respiratory effort. No accessory muscle use.  Cardiovascular: Regular rate and rhythm, no murmurs / rubs / gallops. No extremity edema. 2+ pedal pulses. No carotid bruits.  Abdomen: no tenderness, no masses palpated. No hepatosplenomegaly. Bowel sounds positive.  Musculoskeletal: no clubbing / cyanosis. No joint deformity upper and lower extremities. Good ROM, no contractures. Normal muscle tone.  Skin: no rashes, lesions, ulcers. No induration Neurologic: Left-sided facial droop and left-sided tongue deviation. Sensation intact, DTR normal.  Strength 4/5 on left arm and left leg compared to right side.  Psychiatric: Normal judgment and insight. Alert and oriented x 3. Normal mood.     Labs on Admission: I have personally reviewed following labs and imaging studies  CBC: Recent Labs  Lab 03/14/22 1023 03/14/22 1041  WBC 5.2  --   NEUTROABS 2.5  --   HGB 10.9* 11.2*  HCT 32.7* 33.0*  MCV 91.1  --   PLT 169  --    Basic Metabolic Panel: Recent Labs  Lab 03/14/22 1023 03/14/22 1041  NA 137 139  K 3.1* 3.1*  CL 102 101  CO2 25  --   GLUCOSE 123* 121*  BUN 11 11  CREATININE 0.69 0.60  CALCIUM 8.6*  --    GFR: Estimated Creatinine Clearance: 36.3  mL/min (by C-G formula based on SCr of 0.6 mg/dL). Liver Function Tests: Recent Labs  Lab 03/14/22 1023  AST 26  ALT 17  ALKPHOS 63  BILITOT 0.6  PROT 6.1*  ALBUMIN 3.7   No results for input(s): "LIPASE", "AMYLASE" in the last 168 hours. No results for input(s): "AMMONIA" in the last 168 hours. Coagulation Profile: Recent Labs  Lab 03/14/22 1023  INR 1.1   Cardiac Enzymes: No results for input(s): "CKTOTAL", "CKMB", "CKMBINDEX", "TROPONINI" in the last 168 hours. BNP (last 3 results) No results for input(s): "PROBNP" in the last 8760 hours. HbA1C: No results for input(s): "HGBA1C" in the last 72 hours. CBG: Recent Labs  Lab 03/14/22 1022  GLUCAP 118*   Lipid Profile: No results for input(s): "CHOL", "HDL", "LDLCALC", "TRIG", "CHOLHDL", "LDLDIRECT" in the last 72 hours. Thyroid Function Tests: No results for input(s): "TSH", "T4TOTAL", "FREET4", "T3FREE", "THYROIDAB" in the last 72 hours. Anemia Panel: No results for input(s): "VITAMINB12", "FOLATE", "FERRITIN", "TIBC", "IRON", "RETICCTPCT" in the last 72 hours. Urine analysis:    Component Value Date/Time   COLORURINE STRAW (A) 04/14/2018 1717   APPEARANCEUR CLEAR 04/14/2018 1717   LABSPEC 1.006 04/14/2018 1717   PHURINE 7.0 04/14/2018 1717   GLUCOSEU NEGATIVE 04/14/2018 1717   HGBUR SMALL (A) 04/14/2018 1717   BILIRUBINUR NEGATIVE 04/14/2018 1717   KETONESUR NEGATIVE 04/14/2018 1717   PROTEINUR NEGATIVE 04/14/2018 1717   UROBILINOGEN 0.2 04/19/2014 0610   NITRITE NEGATIVE 04/14/2018 1717   LEUKOCYTESUR SMALL (A) 04/14/2018 1717    Radiological Exams on Admission: MR BRAIN WO CONTRAST  Result Date: 03/14/2022 CLINICAL DATA:  Neuro deficit, acute, stroke suspected. Confused and agitated. Left upper and lower extremity weakness. EXAM: MRI HEAD WITHOUT CONTRAST TECHNIQUE: Multiplanar, multiecho pulse sequences of the brain and surrounding structures were obtained without intravenous contrast. COMPARISON:  CT  head without contrast/1/24. MR head and MRA head 11/15/2019 FINDINGS: Brain: An acute nonhemorrhagic infarct of the posterior limb right internal capsule measures up to 9 mm. No other acute infarcts are present. The remainder the study is severely degraded by patient motion. Remote infarct is present in the posterior right lentiform nucleus extending superiorly to the corona radiata, stable. Extensive periventricular and subcortical white matter changes bilaterally are moderately advanced for age. Wallerian degeneration extends into the brainstem. Brainstem and cerebellum are otherwise within normal limits. The ventricles are proportionate to the degree of atrophy. No significant extraaxial fluid collection is present. Vascular: Flow is present in the major intracranial arteries. Skull and upper cervical spine: The craniocervical junction is normal. Upper cervical spine is within normal limits. Marrow signal is unremarkable. Sinuses/Orbits: The paranasal sinuses and mastoid air cells are clear. The globes and orbits are within normal limits. IMPRESSION: 1. Acute nonhemorrhagic infarct of the posterior limb right internal capsule measures up to 9 mm. 2. Remote infarct of the posterior right lentiform nucleus extending superiorly to the corona radiata, stable. 3. Extensive periventricular and subcortical white matter disease bilaterally is moderately advanced for age. This likely reflects the sequela of chronic microvascular ischemia. 4. The image was discontinued due to severe patient motion. MRA could not be completed at this time. The above was relayed via text pager to Dr. Curly Shores On 03/14/2022 at 13:51 . Electronically Signed   By: San Morelle M.D.   On: 03/14/2022 13:52   CT HEAD CODE STROKE WO CONTRAST  Result Date: 03/14/2022 CLINICAL DATA:  Code stroke.  82 year old female right side deficit. EXAM: CT HEAD WITHOUT CONTRAST TECHNIQUE: Contiguous axial images were obtained from the base of the skull  through the vertex without intravenous contrast. RADIATION DOSE REDUCTION: This exam was performed according to the departmental dose-optimization program which includes automated exposure control, adjustment of the mA and/or kV according to patient size and/or use of iterative reconstruction technique. COMPARISON:  Brain MRI 11/15/2019. Head CT 04/14/2018. CTA neck 11/16/2019. FINDINGS: Brain: Cerebral volume remains normal for age. No midline shift, mass effect, or evidence of intracranial mass lesion. No acute intracranial hemorrhage identified. No ventriculomegaly. Patchy and confluent bilateral cerebral white matter hypodensity, with chronic asymmetric involvement of the right corona radiata and lentiform, appears stable. No cortically based acute infarct identified. Vascular: Calcified atherosclerosis at the skull  base. No suspicious intracranial vascular hyperdensity. Skull: No acute osseous abnormality identified. Sinuses/Orbits: Visualized paranasal sinuses and mastoids are stable and well aerated. Other: No acute orbit or scalp soft tissue finding. ASPECTS [facility] Stroke Program Early CT Score) Total score (0-10 with 10 being normal): 10 IMPRESSION: 1. No acute cortically based infarct or acute intracranial hemorrhage identified. ASPECTS 10. 2. Stable since 2021 non contrast CT appearance of chronic small vessel disease. 3. These results were communicated to Dr. Curly Shores at 10:37 am on 03/14/2022 by text page via the [facility] messaging system. Electronically Signed   By: Genevie Ann M.D.   On: 03/14/2022 10:38    EKG: Independently reviewed.  Sinus arrhythmia, no acute ST changes.  Assessment/Plan Principal Problem:   Stroke [facility]) Active Problems:   S/P AVR (aortic valve replacement)   Stroke (cerebrum) (HCC)   Hypertension  (please populate well all problems here in Problem List. (For example, if patient is on BP meds at home and you resume or decide to hold them, it is a problem that needs to be her.  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 Long time discussion with daughter at bedside, daughter will try to convince patient again about MRA, which I ordered for tomorrow. -There is a question of PAF, as neurology reported seeing A-fib on telemetry monitoring monitoring in the ED.  Currently, patient moved to another ED room so the initial ED telemetry monitoring record unavailable.  Will monitor patient for another 24 hours, if there is PAF, the patient will be discharged on anticoagulation.  Dr. Curly Shores with neurology found that patient had one-time A-fib after thoracic aneurysm repair in 2016, otherwise, there is no documented potation no A-fib episode since as per her cardiologist office visit.  If no A-fib caught on telemetry monitoring or EKG during this hospital stay, may need to consider discharge patient on Zio patch.  HTN -PRN hydralazine for now  Thoracic aortic aneurysm -Status post repair in 2016, as per family, condition is considered to be stable, no follow-up since.  Aortic stenosis -Status post biosynthetic aortic valve repair in 2016 same time as aortic aneurysm repair  Chronic HFrEF -With recovered LVEF -No acute concern  Moderate protein calorie malnutrition -BMI= 18, recommend protein shake  DVT prophylaxis: Lovenox Code Status: Full code Family Communication: Daughter at bedside Disposition Plan: Expect less than 2 midnight hospital stay Consults called: Neurology Admission status: Tele observation   Lequita Halt MD [facility] Pager [phone]  03/14/2022, 2:34 PM

## 2022-03-14 NOTE — ED Notes (Signed)
Patient continues to be drowsy from Ativan, unable to do swallow exam at this time or give medications. Will continue to monitor

## 2022-03-15 ENCOUNTER — Observation Stay (HOSPITAL_COMMUNITY): Payer: Medicare Other

## 2022-03-15 ENCOUNTER — Encounter (HOSPITAL_COMMUNITY): Payer: 59

## 2022-03-15 DIAGNOSIS — I6389 Other cerebral infarction: Secondary | ICD-10-CM

## 2022-03-15 DIAGNOSIS — I4891 Unspecified atrial fibrillation: Secondary | ICD-10-CM | POA: Diagnosis present

## 2022-03-15 DIAGNOSIS — R29810 Facial weakness: Secondary | ICD-10-CM | POA: Diagnosis present

## 2022-03-15 DIAGNOSIS — I6310 Cerebral infarction due to embolism of unspecified precerebral artery: Secondary | ICD-10-CM

## 2022-03-15 DIAGNOSIS — I10 Essential (primary) hypertension: Secondary | ICD-10-CM | POA: Diagnosis not present

## 2022-03-15 DIAGNOSIS — D649 Anemia, unspecified: Secondary | ICD-10-CM | POA: Diagnosis present

## 2022-03-15 DIAGNOSIS — G8194 Hemiplegia, unspecified affecting left nondominant side: Secondary | ICD-10-CM | POA: Diagnosis present

## 2022-03-15 DIAGNOSIS — I5022 Chronic systolic (congestive) heart failure: Secondary | ICD-10-CM | POA: Diagnosis present

## 2022-03-15 DIAGNOSIS — E785 Hyperlipidemia, unspecified: Secondary | ICD-10-CM | POA: Diagnosis present

## 2022-03-15 DIAGNOSIS — Z9851 Tubal ligation status: Secondary | ICD-10-CM | POA: Diagnosis not present

## 2022-03-15 DIAGNOSIS — I2510 Atherosclerotic heart disease of native coronary artery without angina pectoris: Secondary | ICD-10-CM | POA: Diagnosis present

## 2022-03-15 DIAGNOSIS — I6381 Other cerebral infarction due to occlusion or stenosis of small artery: Secondary | ICD-10-CM | POA: Diagnosis present

## 2022-03-15 DIAGNOSIS — F419 Anxiety disorder, unspecified: Secondary | ICD-10-CM | POA: Diagnosis present

## 2022-03-15 DIAGNOSIS — Z953 Presence of xenogenic heart valve: Secondary | ICD-10-CM | POA: Diagnosis not present

## 2022-03-15 DIAGNOSIS — Z66 Do not resuscitate: Secondary | ICD-10-CM | POA: Diagnosis present

## 2022-03-15 DIAGNOSIS — G250 Essential tremor: Secondary | ICD-10-CM | POA: Diagnosis present

## 2022-03-15 DIAGNOSIS — E871 Hypo-osmolality and hyponatremia: Secondary | ICD-10-CM | POA: Diagnosis present

## 2022-03-15 DIAGNOSIS — Z8679 Personal history of other diseases of the circulatory system: Secondary | ICD-10-CM | POA: Diagnosis not present

## 2022-03-15 DIAGNOSIS — I6300 Cerebral infarction due to thrombosis of unspecified precerebral artery: Secondary | ICD-10-CM

## 2022-03-15 DIAGNOSIS — M353 Polymyalgia rheumatica: Secondary | ICD-10-CM | POA: Diagnosis present

## 2022-03-15 DIAGNOSIS — Z823 Family history of stroke: Secondary | ICD-10-CM | POA: Diagnosis not present

## 2022-03-15 DIAGNOSIS — Z7982 Long term (current) use of aspirin: Secondary | ICD-10-CM | POA: Diagnosis not present

## 2022-03-15 DIAGNOSIS — Z8673 Personal history of transient ischemic attack (TIA), and cerebral infarction without residual deficits: Secondary | ICD-10-CM | POA: Diagnosis not present

## 2022-03-15 DIAGNOSIS — Z681 Body mass index (BMI) 19 or less, adult: Secondary | ICD-10-CM | POA: Diagnosis not present

## 2022-03-15 DIAGNOSIS — R000 Tachycardia, unspecified: Secondary | ICD-10-CM | POA: Diagnosis present

## 2022-03-15 DIAGNOSIS — I639 Cerebral infarction, unspecified: Secondary | ICD-10-CM | POA: Diagnosis present

## 2022-03-15 DIAGNOSIS — E876 Hypokalemia: Secondary | ICD-10-CM | POA: Diagnosis present

## 2022-03-15 DIAGNOSIS — I110 Hypertensive heart disease with heart failure: Secondary | ICD-10-CM | POA: Diagnosis present

## 2022-03-15 DIAGNOSIS — E43 Unspecified severe protein-calorie malnutrition: Secondary | ICD-10-CM | POA: Diagnosis present

## 2022-03-15 LAB — LIPID PANEL
Cholesterol: 216 mg/dL — ABNORMAL HIGH (ref 0–200)
HDL: 77 mg/dL (ref >40)
LDL Cholesterol: 127 mg/dL — ABNORMAL HIGH (ref 0–99)
Total CHOL/HDL Ratio: 2.8 RATIO
Triglycerides: 61 mg/dL (ref <150)
VLDL: 12 mg/dL (ref 0–40)

## 2022-03-15 LAB — ECHOCARDIOGRAM COMPLETE
AR max vel: 1.23 cm2
AV Area VTI: 1.4 cm2
AV Area mean vel: 1.3 cm2
AV Mean grad: 11 mmHg
AV Peak grad: 21.3 mmHg
Ao pk vel: 2.31 m/s
Area-P 1/2: 3.45 cm2
S' Lateral: 2.2 cm
Weight: 1523.82 oz

## 2022-03-15 LAB — HEMOGLOBIN A1C
Hgb A1c MFr Bld: 5.7 % — ABNORMAL HIGH (ref 4.8–5.6)
Mean Plasma Glucose: 117 mg/dL

## 2022-03-15 NOTE — ED Notes (Signed)
Patient was able to swallow pills without difficulty. After administration of medications this RN noted that patient had significant drooling and intermittent coughing. Provider made aware of same and requests speech to evaluate patient. Will place order for speech therapy.

## 2022-03-15 NOTE — Progress Notes (Signed)
PROGRESS NOTE    Deaisha Welborn  IFO:[id number] DOB: May 03, 1940 DOA: 03/14/2022 PCP: Kelton Pillar, MD   Brief Narrative:  Yvonne Ballard is a 82 y.o. female with medical history significant of TIA, HTN, chronic HFrEF, aortic stenosis status post biosynthetic TAVR, ascending aortic aneurysm, nonobstructive CAD, presented with new onset of left-sided weakness.   Patient admitted as above with acute onset left-sided weakness, MRI consistent with acute CVA.  Neurology following and appreciate insight and recommendations.  Tentative plan for disposition currently to SNF pending clinical course.  Patient currently lives in an independent living facility.   Assessment & Plan:   Principal Problem:   Stroke [facility]) Active Problems:   S/P AVR (aortic valve replacement)   Stroke (cerebrum) (HCC)   Hypertension  Acute internal capsule right stroke, questionably thrombotic -Aspirin Plavix x 3 weeks transition to Plavix only thereafter. -Patient refusing statin, discussed low-fat diet -MRI confirms acute CVA, MRA without acute findings -PT OT following, current recommendations for SNF -Speech to follow  Hypertension -Permissive hypertension, goal normotension in the next 48 to 72 hours  History of thoracic aortic aneurysm Aortic stenosis status post biosynthetic TAVR -Continue blood pressure control as above, no acute indication for further evaluation imaging or workup at this time.   Chronic heart failure with reduced ejection fraction -EF 55 to 60% during this hospitalization with left ventricle regional wall motion abnormalities and mild hypertrophy -Bioprosthetic aortic valve without stenosis or regurgitation  Moderate to severe protein caloric malnutrition BMI 18 -Continue to advance diet as tolerated  DVT prophylaxis: Lovenox Code Status: Full Family Communication: Daughter at bedside  Status is: Inpatient  Dispo: The patient is from: Independent living               Anticipated d/c is to: SNF              Anticipated d/c date is: Pending safe disposition, medically stable in the next 24 hours  Consultants:  Neurology  Procedures:  None  Antimicrobials:  None indicated  Subjective: No acute issues or events overnight, weakness appears to be improving but not yet resolved.  Otherwise denies nausea vomiting diarrhea constipation any fevers chills or chest pain  Objective: Vitals:   03/14/22 1945 03/14/22 2025 03/15/22 0038 03/15/22 0606  BP: 138/74  (!) 125/91 (!) 153/84  Pulse: (!) 56  (!) 113 80  Resp: 18  20 16   Temp:  98.6 F (37 C) 98.4 F (36.9 C) 98.4 F (36.9 C)  TempSrc:   Oral Oral  SpO2: 100%  95% 97%  Weight:        Intake/Output Summary (Last 24 hours) at 03/15/2022 0756 Last data filed at 03/15/2022 0542 Gross per 24 hour  Intake 1000 ml  Output --  Net 1000 ml   Filed Weights   03/14/22 1027  Weight: 43.2 kg    Examination:  General:  Pleasantly resting in bed, No acute distress. HEENT:  Normocephalic atraumatic.  Sclerae nonicteric, noninjected.  Extraocular movements intact bilaterally. Neck:  Without mass or deformity.  Trachea is midline. Lungs:  Clear to auscultate bilaterally without rhonchi, wheeze, or rales. Heart:  Regular rate and rhythm.  Without murmurs, rubs, or gallops. Abdomen:  Soft, nontender, nondistended.  Without guarding or rebound. Extremities: Without cyanosis, clubbing, edema, or obvious deformity. Vascular:  Dorsalis pedis and posterior tibial pulses palpable bilaterally. Skin:  Warm and dry, no erythema   Data Reviewed: I have personally reviewed following labs and imaging studies  CBC: Recent Labs  Lab 03/14/22 1023 03/14/22 1041  WBC 5.2  --   NEUTROABS 2.5  --   HGB 10.9* 11.2*  HCT 32.7* 33.0*  MCV 91.1  --   PLT 169  --    Basic Metabolic Panel: Recent Labs  Lab 03/14/22 1023 03/14/22 1041  NA 137 139  K 3.1* 3.1*  CL 102 101  CO2 25  --   GLUCOSE 123* 121*  BUN  11 11  CREATININE 0.69 0.60  CALCIUM 8.6*  --    GFR: Estimated Creatinine Clearance: 36.3 mL/min (by C-G formula based on SCr of 0.6 mg/dL). Liver Function Tests: Recent Labs  Lab 03/14/22 1023  AST 26  ALT 17  ALKPHOS 63  BILITOT 0.6  PROT 6.1*  ALBUMIN 3.7   No results for input(s): "LIPASE", "AMYLASE" in the last 168 hours. No results for input(s): "AMMONIA" in the last 168 hours. Coagulation Profile: Recent Labs  Lab 03/14/22 1023  INR 1.1   Cardiac Enzymes: No results for input(s): "CKTOTAL", "CKMB", "CKMBINDEX", "TROPONINI" in the last 168 hours. BNP (last 3 results) No results for input(s): "PROBNP" in the last 8760 hours. HbA1C: No results for input(s): "HGBA1C" in the last 72 hours. CBG: Recent Labs  Lab 03/14/22 1022  GLUCAP 118*   Lipid Profile: No results for input(s): "CHOL", "HDL", "LDLCALC", "TRIG", "CHOLHDL", "LDLDIRECT" in the last 72 hours. Thyroid Function Tests: No results for input(s): "TSH", "T4TOTAL", "FREET4", "T3FREE", "THYROIDAB" in the last 72 hours. Anemia Panel: No results for input(s): "VITAMINB12", "FOLATE", "FERRITIN", "TIBC", "IRON", "RETICCTPCT" in the last 72 hours. Sepsis Labs: No results for input(s): "PROCALCITON", "LATICACIDVEN" in the last 168 hours.  No results found for this or any previous visit (from the past 240 hour(s)).   Radiology Studies: MR ANGIO NECK WO CONTRAST  Result Date: 03/14/2022 CLINICAL DATA:  Stroke, follow-up. Acute infarct involving the posterior limb right internal capsule. EXAM: MRA NECK WITHOUT CONTRAST TECHNIQUE: Angiographic images of the neck were acquired using MRA technique without intravenous contrast. Carotid stenosis measurements (when applicable) are obtained utilizing NASCET criteria, using the distal internal carotid diameter as the denominator. COMPARISON:  CTA neck 11/16/2019 FINDINGS: The time-of-flight images demonstrate no significant flow disturbance at either carotid bifurcation.  Flow is antegrade in the right vertebral artery. No antegrade flow is evident in the left vertebral artery. IMPRESSION: 1. No significant carotid artery disease. 2. No antegrade flow is evident in the left vertebral artery. Electronically Signed   By: San Morelle M.D.   On: 03/14/2022 17:17   MR ANGIO HEAD WO CONTRAST  Result Date: 03/14/2022 CLINICAL DATA:  Acute infarct scratched at stroke, follow-up. Acute infarct of the posterior limb right internal capsule. EXAM: MRA HEAD WITHOUT CONTRAST TECHNIQUE: Angiographic images of the Circle of Willis were acquired using MRA technique without intravenous contrast. COMPARISON:  MR head without contrast 03/14/2022. MRA of the head 11/15/2019 FINDINGS: Anterior circulation: Extensive atherosclerotic irregularity is present within the cavernous left internal carotid artery. Signal loss is present at the left ICA terminus scratched at signal loss at the left ICA terminus is artifactual at the slab overlap. The right A1 segment is dominant. Signal loss in the distal right M1 segment is artifactual. Mild irregularity is present throughout the ACA and MCA branch vessels without a significant proximal stenosis or occlusion. Posterior circulation: Distal vertebral arteries are incompletely visualized. Basilar artery is within normal limits. Signal loss in the posterior cerebral arteries is artifactual proximally. There is asymmetric attenuation of distal branch vessels on  the right. Anatomic variants: None Other: None. IMPRESSION: 1. Significant motion artifact resulting in multiple areas of signal loss. 2. Extensive atherosclerotic changes within the cavernous left internal carotid artery are similar the prior study. 3. Distal small vessel disease without definite proximal stenosis or occlusion. Electronically Signed   By: San Morelle M.D.   On: 03/14/2022 17:15   MR BRAIN WO CONTRAST  Result Date: 03/14/2022 CLINICAL DATA:  Neuro deficit, acute, stroke  suspected. Confused and agitated. Left upper and lower extremity weakness. EXAM: MRI HEAD WITHOUT CONTRAST TECHNIQUE: Multiplanar, multiecho pulse sequences of the brain and surrounding structures were obtained without intravenous contrast. COMPARISON:  CT head without contrast/1/24. MR head and MRA head 11/15/2019 FINDINGS: Brain: An acute nonhemorrhagic infarct of the posterior limb right internal capsule measures up to 9 mm. No other acute infarcts are present. The remainder the study is severely degraded by patient motion. Remote infarct is present in the posterior right lentiform nucleus extending superiorly to the corona radiata, stable. Extensive periventricular and subcortical white matter changes bilaterally are moderately advanced for age. Wallerian degeneration extends into the brainstem. Brainstem and cerebellum are otherwise within normal limits. The ventricles are proportionate to the degree of atrophy. No significant extraaxial fluid collection is present. Vascular: Flow is present in the major intracranial arteries. Skull and upper cervical spine: The craniocervical junction is normal. Upper cervical spine is within normal limits. Marrow signal is unremarkable. Sinuses/Orbits: The paranasal sinuses and mastoid air cells are clear. The globes and orbits are within normal limits. IMPRESSION: 1. Acute nonhemorrhagic infarct of the posterior limb right internal capsule measures up to 9 mm. 2. Remote infarct of the posterior right lentiform nucleus extending superiorly to the corona radiata, stable. 3. Extensive periventricular and subcortical white matter disease bilaterally is moderately advanced for age. This likely reflects the sequela of chronic microvascular ischemia. 4. The image was discontinued due to severe patient motion. MRA could not be completed at this time. The above was relayed via text pager to Dr. Curly Shores On 03/14/2022 at 13:51 . Electronically Signed   By: San Morelle M.D.   On:  03/14/2022 13:52   CT HEAD CODE STROKE WO CONTRAST  Result Date: 03/14/2022 CLINICAL DATA:  Code stroke.  82 year old female right side deficit. EXAM: CT HEAD WITHOUT CONTRAST TECHNIQUE: Contiguous axial images were obtained from the base of the skull through the vertex without intravenous contrast. RADIATION DOSE REDUCTION: This exam was performed according to the departmental dose-optimization program which includes automated exposure control, adjustment of the mA and/or kV according to patient size and/or use of iterative reconstruction technique. COMPARISON:  Brain MRI 11/15/2019. Head CT 04/14/2018. CTA neck 11/16/2019. FINDINGS: Brain: Cerebral volume remains normal for age. No midline shift, mass effect, or evidence of intracranial mass lesion. No acute intracranial hemorrhage identified. No ventriculomegaly. Patchy and confluent bilateral cerebral white matter hypodensity, with chronic asymmetric involvement of the right corona radiata and lentiform, appears stable. No cortically based acute infarct identified. Vascular: Calcified atherosclerosis at the skull base. No suspicious intracranial vascular hyperdensity. Skull: No acute osseous abnormality identified. Sinuses/Orbits: Visualized paranasal sinuses and mastoids are stable and well aerated. Other: No acute orbit or scalp soft tissue finding. ASPECTS [facility] Stroke Program Early CT Score) Total score (0-10 with 10 being normal): 10 IMPRESSION: 1. No acute cortically based infarct or acute intracranial hemorrhage identified. ASPECTS 10. 2. Stable since 2021 non contrast CT appearance of chronic small vessel disease. 3. These results were communicated to Dr. Curly Shores at  10:37 am on 03/14/2022 by text page via the [facility] messaging system. Electronically Signed   By: Genevie Ann M.D.   On: 03/14/2022 10:38    Scheduled Meds:   stroke: early stages of recovery book   Does not apply Once   aspirin  325 mg Oral Daily   clopidogrel  75 mg Oral Daily    enoxaparin (LOVENOX) injection  30 mg Subcutaneous Q24H   Continuous Infusions:   LOS: 0 days   Time spent: 28min  Guynell Kleiber C Telisa Ohlsen, DO [facility]  If 7PM-7AM, please contact night-coverage [email or web address]  03/15/2022, 7:56 AM

## 2022-03-15 NOTE — Progress Notes (Signed)
Transition of Care [facility]) - Emergency Department Mini Assessment   Patient Details  Name: Sanyla Summey MRN: [id number] Date of Birth: May 21, 1940  Transition of Care [facility]) CM/SW Contact:    Fuller Mandril, RN Phone Number: 03/15/2022, 1:55 PM   Clinical Narrative:  RNCM consulted regarding disposition plan.  Pt and family considering home with home health vs skilled nursing facility placement.  RNCM met with pt and daughter at bedside and they decided that SNF would be the best option as pt needs more physical therapy and nursing care than home health services can provide.  ED Mini Assessment:                  Patient Contact and Communications     Spoke with: (P) patient and daughter  ,                 Admission diagnosis:  Stroke [facility]) [I63.9] Acute CVA (cerebrovascular accident) [facility]) [I63.9] Patient Active Problem List   Diagnosis Date Noted   Acute CVA (cerebrovascular accident) ([place]) 03/15/2022   Stroke ([place]) 03/14/2022   CRAO (central retinal artery occlusion), right 11/15/2019   Weight loss    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[place])    Hypertension    History of hiatal hernia    Family history of adverse reaction to anesthesia    Ascending aortic aneurysm (HCC)    Anxiety disorder    Abnormal ECG    Anxiety 02/07/2017   Hiatal hernia 02/07/2017   Stroke (cerebrum) ([place]) 02/07/2017   CAD (coronary artery disease), native coronary artery 07/26/2014   Dyslipidemia, goal LDL below 70 07/26/2014   S/P AVR (aortic valve replacement)    Aortic aneurysm, thoracic ([place]) 03/31/2014   Benign essential HTN 03/18/2014   LVH (left ventricular hypertrophy) due to hypertensive disease 03/18/2014   Heart murmur 03/18/2014   Coronary artery disease 03/14/2014   PCP:  Kelton Pillar, MD Pharmacy:   [facility] [place], [address] AT [facility] [place] [address] Phone: [phone] Fax:  [phone]  [facility] [place], [place] [address] [place] [address] Phone: [phone] Fax: [phone]

## 2022-03-15 NOTE — Evaluation (Addendum)
Speech Language Pathology Evaluation Patient Details Name: Yvonne Ballard MRN: [id number] DOB: October 26, 1940 Today's Date: 03/15/2022 Time: 1345-1400 SLP Time Calculation (min) (ACUTE ONLY): 15 min  Problem List:  Patient Active Problem List   Diagnosis Date Noted   Acute CVA (cerebrovascular accident) ([place]) 03/15/2022   Stroke ([place]) 03/14/2022   CRAO (central retinal artery occlusion), right 11/15/2019   Weight loss    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[place])    Hypertension    History of hiatal hernia    Family history of adverse reaction to anesthesia    Ascending aortic aneurysm (HCC)    Anxiety disorder    Abnormal ECG    Anxiety 02/07/2017   Hiatal hernia 02/07/2017   Stroke (cerebrum) ([place]) 02/07/2017   CAD (coronary artery disease), native coronary artery 07/26/2014   Dyslipidemia, goal LDL below 70 07/26/2014   S/P AVR (aortic valve replacement)    Aortic aneurysm, thoracic ([place]) 03/31/2014   Benign essential HTN 03/18/2014   LVH (left ventricular hypertrophy) due to hypertensive disease 03/18/2014   Heart murmur 03/18/2014   Coronary artery disease 03/14/2014   Past Medical History:  Past Medical History:  Diagnosis Date   Abnormal ECG    Anxiety    pt. reports that she is nervous person   Anxiety disorder    Ascending aortic aneurysm (HCC)    s/p repair with Bental procedure   Coronary artery disease 03/2014   mild non obstructive CAD   Dyslipidemia, goal LDL below 70 07/26/2014   Family history of adverse reaction to anesthesia    Heart murmur    History of hiatal hernia    Hypertension    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HCC)    S/P AVR (aortic valve replacement)    tissue valve   Weight loss    Past Surgical History:  Past Surgical History:  Procedure Laterality Date   BENTALL PROCEDURE N/A 03/31/2014   Procedure: BIO-BENTALL PROCEDURE using a 67mm Magna Ease Aortic Tissue Valve, a 26 mm Terumo Gelweave Valsalva Graft, a 14x10x60mm Terumo Gelweave Graft, and a  73mm straight Hemashield Platinum Graft.;  Surgeon: Ivin Poot, MD;  Location: [place];  Service: Open Heart Surgery;  Laterality: N/A;   LEFT AND RIGHT HEART CATHETERIZATION WITH CORONARY ANGIOGRAM N/A 03/26/2014   Procedure: LEFT AND RIGHT HEART CATHETERIZATION WITH CORONARY ANGIOGRAM;  Surgeon: Peter M Martinique, MD;  Location: [facility] CATH LAB;  Service: Cardiovascular;  Laterality: N/A;   TEE WITHOUT CARDIOVERSION N/A 03/31/2014   Procedure: TRANSESOPHAGEAL ECHOCARDIOGRAM (TEE);  Surgeon: Ivin Poot, MD;  Location: [place];  Service: Open Heart Surgery;  Laterality: N/A;   TONSILLECTOMY     TUBAL LIGATION     HPI:  82yo female admitted 1/1/224 with left side weakness. PMH: TIA, HTN, chronic HFrEF, aortic stenosis s/p biosynthetic TAVR, ascending aortic aneurysm, nonobstructive CAD. MRI = Acute nonhemorrhagic infarct of the posterior limb right internal capsule.   Assessment / Plan / Recommendation Clinical Impression  Pt seen at bedside for cognitive-linguistic evaluation. The Mini Mental State Exam (MMSE) was administered. Pt scored 27/27 (items requiring writing were not administered due to jerky movements of upper extremities. Pts speech is mildly dysarthric. Receptive and Expressive Language are intact. No further ST intervention is recommended for cognitive-linguistic status, as pt is currently at baseline. SLP will continue to follow for dysphagia therapy and strengthing oral motor misculature to maximize intelligibility and swallow safety.   Daughter arrived later, and stated that pt is "all there". Explained test results.  SLP Assessment  SLP Recommendation/Assessment: Patient does not need any further Speech Language Pathology Services  SLP Visit Diagnosis: Cognitive communication deficit (R41.841)    Recommendations for follow up therapy are one component of a multi-disciplinary discharge planning process, led by the attending physician.  Recommendations may be updated based on  patient status, additional functional criteria and insurance authorization.    Follow Up Recommendations  No SLP follow up (for cognitive-linguistics)    Functional Status Assessment Patient has not had a recent decline in their functional status     SLP Evaluation Cognition  Overall Cognitive Status: Impaired/Different from baseline Orientation Level: Oriented X4 Year: 2024 Month: January Day of Week: Correct       Comprehension  Auditory Comprehension Overall Auditory Comprehension: Appears within functional limits for tasks assessed Conversation: Simple EffectiveTechniques: Extra processing time;Pausing;Repetition;Increased volume;Slowed speech Reading Comprehension Reading Status: Not tested    Expression Expression Primary Mode of Expression: Verbal Verbal Expression Overall Verbal Expression: Appears within functional limits for tasks assessed Written Expression Dominant Hand: Right   Oral / Motor  Oral Motor/Sensory Function Overall Oral Motor/Sensory Function: Mild impairment Facial ROM: Reduced left Facial Symmetry: Abnormal symmetry left Facial Strength: Reduced left Facial Sensation: Within Functional Limits Lingual ROM: Within Functional Limits Lingual Symmetry: Within Functional Limits Lingual Strength: Reduced Lingual Sensation: Within Functional Limits Mandible: Within Functional Limits Motor Speech Overall Motor Speech: Appears within functional limits for tasks assessed Intelligibility: Intelligible           Yvonne Ballard B. Quentin Ore, [facility], [place] Speech Language Pathologist Office: [phone]  Yvonne Ballard 03/15/2022, 2:49 PM

## 2022-03-15 NOTE — ED Notes (Signed)
ED TO INPATIENT HANDOFF REPORT  ED Nurse Name and Phone #: Philip Aspen Name/Age/Gender Yvonne Ballard 82 y.o. female Room/Bed: H024C/H024C  Code Status   Code Status: Full Code  Home/SNF/Other Home Patient oriented to: self, place, time, and situation Is this baseline? No   Triage Complete: Triage complete  Chief Complaint Stroke [facility]) [I63.9] Acute CVA (cerebrovascular accident) [facility]) [I63.9]  Triage Note Pt bib [facility] from [place] where she is in independent living. Pt woke up at 3am to go to the bathroom with no issues, pt then woke up at 9am with Left leg and arm weakness. Pt was unable to control her left arm when she was trying to make coffee after waking up. Pt also has facial droop and right sided twitching. Pt normally has some twitching on both sides.  Pt arrives AOx4 and daughter at bedside EMS vitals: 180/110, 99%RA, 60-110 controlled afib  Pt has hx of afib but does not take thinners. Pt also has hx of right eye problems.   Allergies No Known Allergies  Level of Care/Admitting Diagnosis ED Disposition     ED Disposition  Admit   Condition  --   Comment  Hospital Area: [facility] [[id number]]  Level of Care: Med-Surg [16]  May admit patient to Yvonne Ballard or Yvonne Ballard if equivalent level of care is available:: No  Covid Evaluation: Confirmed COVID Negative  Diagnosis: Acute CVA (cerebrovascular accident) [facility]) [[id number]]  Admitting Physician: Little Ishikawa [[id number]]  Attending Physician: Little Ishikawa [[id number]]  Certification:: I certify this patient will need inpatient services for at least 2 midnights  Estimated Length of Stay: 2          B Medical/Surgery History Past Medical History:  Diagnosis Date   Abnormal ECG    Anxiety    pt. reports that she is nervous person   Anxiety disorder    Ascending aortic aneurysm ([place])    s/p repair with Bental procedure   Coronary artery disease 03/2014   mild non  obstructive CAD   Dyslipidemia, goal LDL below 70 07/26/2014   Family history of adverse reaction to anesthesia    Heart murmur    History of hiatal hernia    Hypertension    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HCC)    S/P AVR (aortic valve replacement)    tissue valve   Weight loss    Past Surgical History:  Procedure Laterality Date   BENTALL PROCEDURE N/A 03/31/2014   Procedure: BIO-BENTALL PROCEDURE using a 80mm Magna Ease Aortic Tissue Valve, a 26 mm Terumo Gelweave Valsalva Graft, a 14x10x65mm Terumo Gelweave Graft, and a 38mm straight Hemashield Platinum Graft.;  Surgeon: Ivin Poot, MD;  Location: [place];  Service: Open Heart Surgery;  Laterality: N/A;   LEFT AND RIGHT HEART CATHETERIZATION WITH CORONARY ANGIOGRAM N/A 03/26/2014   Procedure: LEFT AND RIGHT HEART CATHETERIZATION WITH CORONARY ANGIOGRAM;  Surgeon: Peter M Martinique, MD;  Location: [facility] CATH LAB;  Service: Cardiovascular;  Laterality: N/A;   TEE WITHOUT CARDIOVERSION N/A 03/31/2014   Procedure: TRANSESOPHAGEAL ECHOCARDIOGRAM (TEE);  Surgeon: Ivin Poot, MD;  Location: [place];  Service: Open Heart Surgery;  Laterality: N/A;   TONSILLECTOMY     TUBAL LIGATION       A IV Location/Drains/Wounds Patient Lines/Drains/Airways Status     Active Line/Drains/Airways     Name Placement date Placement time Site Days   Peripheral IV 03/14/22 18 G Left Antecubital 03/14/22  1107  Antecubital  1   External Urinary Catheter 03/15/22  1207  --  less than 1            Intake/Output Last 24 hours  Intake/Output Summary (Last 24 hours) at 03/15/2022 1733 Last data filed at 03/15/2022 0542 Gross per 24 hour  Intake 1000 ml  Output --  Net 1000 ml    Labs/Imaging Results for orders placed or performed during the hospital encounter of 03/14/22 (from the past 48 hour(s))  CBG monitoring, ED     Status: Abnormal   Collection Time: 03/14/22 10:22 AM  Result Value Ref Range   Glucose-Capillary 118 (H) 70 - 99 mg/dL    Comment:  Glucose reference range applies only to samples taken after fasting for at least 8 hours.  Protime-INR     Status: None   Collection Time: 03/14/22 10:23 AM  Result Value Ref Range   Prothrombin Time 13.8 11.4 - 15.2 seconds   INR 1.1 0.8 - 1.2    Comment: (NOTE) INR goal varies based on device and disease states. Performed at [facility], [place] [address], [place], [place] [address]   APTT     Status: None   Collection Time: 03/14/22 10:23 AM  Result Value Ref Range   aPTT 32 24 - 36 seconds    Comment: Performed at [facility] [address], [place], [place] [address]  CBC     Status: Abnormal   Collection Time: 03/14/22 10:23 AM  Result Value Ref Range   WBC 5.2 4.0 - 10.5 K/uL   RBC 3.59 (L) 3.87 - 5.11 MIL/uL   Hemoglobin 10.9 (L) 12.0 - 15.0 g/dL   HCT 32.7 (L) 36.0 - 46.0 %   MCV 91.1 80.0 - 100.0 fL   MCH 30.4 26.0 - 34.0 pg   MCHC 33.3 30.0 - 36.0 g/dL   RDW 13.8 11.5 - 15.5 %   Platelets 169 150 - 400 K/uL   nRBC 0.0 0.0 - 0.2 %    Comment: Performed at [facility], [place] [address] [place], [place] [address]  Differential     Status: None   Collection Time: 03/14/22 10:23 AM  Result Value Ref Range   Neutrophils Relative % 48 %   Neutro Abs 2.5 1.7 - 7.7 K/uL   Lymphocytes Relative 43 %   Lymphs Abs 2.3 0.7 - 4.0 K/uL   Monocytes Relative 6 %   Monocytes Absolute 0.3 0.1 - 1.0 K/uL   Eosinophils Relative 2 %   Eosinophils Absolute 0.1 0.0 - 0.5 K/uL   Basophils Relative 1 %   Basophils Absolute 0.0 0.0 - 0.1 K/uL   Immature Granulocytes 0 %   Abs Immature Granulocytes 0.02 0.00 - 0.07 K/uL    Comment: Performed at [facility] [address], [place], [place] [address]  Comprehensive metabolic panel     Status: Abnormal   Collection Time: 03/14/22 10:23 AM  Result Value Ref Range   Sodium 137 135 - 145 mmol/L   Potassium 3.1 (L) 3.5 - 5.1 mmol/L   Chloride 102 98 - 111 mmol/L   CO2 25 22 - 32 mmol/L   Glucose, Bld 123 (H) 70 -  99 mg/dL    Comment: Glucose reference range applies only to samples taken after fasting for at least 8 hours.   BUN 11 8 - 23 mg/dL   Creatinine, Ser 0.69 0.44 - 1.00 mg/dL   Calcium 8.6 (L) 8.9 - 10.3  mg/dL   Total Protein 6.1 (L) 6.5 - 8.1 g/dL   Albumin 3.7 3.5 - 5.0 g/dL   AST 26 15 - 41 U/L   ALT 17 0 - 44 U/L   Alkaline Phosphatase 63 38 - 126 U/L   Total Bilirubin 0.6 0.3 - 1.2 mg/dL   GFR, Estimated >60 >60 mL/min    Comment: (NOTE) Calculated using the CKD-EPI Creatinine Equation (2021)    Anion gap 10 5 - 15    Comment: Performed at [facility] [address], [place], [place] [address]  Ethanol     Status: None   Collection Time: 03/14/22 10:23 AM  Result Value Ref Range   Alcohol, Ethyl (B) <10 <10 mg/dL    Comment: (NOTE) Lowest detectable limit for serum alcohol is 10 mg/dL.  For medical purposes only. Performed at [facility], [place] [address] [place], [place] [address]   Sedimentation rate     Status: None   Collection Time: 03/14/22 10:23 AM  Result Value Ref Range   Sed Rate 7 0 - 22 mm/hr    Comment: Performed at [facility] [address] [place], [place] [address]  Lipid panel     Status: Abnormal   Collection Time: 03/14/22 10:23 AM  Result Value Ref Range   Cholesterol 216 (H) 0 - 200 mg/dL   Triglycerides 61 <150 mg/dL   HDL 77 >40 mg/dL   Total CHOL/HDL Ratio 2.8 RATIO   VLDL 12 0 - 40 mg/dL   LDL Cholesterol 127 (H) 0 - 99 mg/dL    Comment:        Total Cholesterol/HDL:CHD Risk Coronary Heart Disease Risk Table                     Men   Women  1/2 Average Risk   3.4   3.3  Average Risk       5.0   4.4  2 X Average Risk   9.6   7.1  3 X Average Risk  23.4   11.0        Use the calculated Patient Ratio above and the CHD Risk Table to determine the patient's CHD Risk.        ATP III CLASSIFICATION (LDL):  <100     mg/dL   Optimal  100-129  mg/dL   Near or Above                    Optimal  130-159  mg/dL    Borderline  160-189  mg/dL   High  >190     mg/dL   Very High Performed at [facility] [address], [place], [place] [address]   I-stat chem 8, ED     Status: Abnormal   Collection Time: 03/14/22 10:41 AM  Result Value Ref Range   Sodium 139 135 - 145 mmol/L   Potassium 3.1 (L) 3.5 - 5.1 mmol/L   Chloride 101 98 - 111 mmol/L   BUN 11 8 - 23 mg/dL   Creatinine, Ser 0.60 0.44 - 1.00 mg/dL   Glucose, Bld 121 (H) 70 - 99 mg/dL    Comment: Glucose reference range applies only to samples taken after fasting for at least 8 hours.   Calcium, Ion 1.07 (L) 1.15 - 1.40 mmol/L   TCO2 27 22 - 32 mmol/L   Hemoglobin 11.2 (L) 12.0 - 15.0 g/dL   HCT 33.0 (L)  36.0 - 46.0 %  C-reactive protein     Status: None   Collection Time: 03/14/22  2:30 PM  Result Value Ref Range   CRP 0.8 <1.0 mg/dL    Comment: Performed at [facility], [place] [address], [place], [place] [address]   ECHOCARDIOGRAM COMPLETE  Result Date: 03/15/2022    ECHOCARDIOGRAM REPORT   Patient Name:   Yvonne Ballard Date of Exam: 03/15/2022 Medical Rec #:  [id number]      Height:       60.0 in Accession #:    [id number]     Weight:       95.2 lb Date of Birth:  [birth year]     BSA:          1.362 m Patient Age:    57 years       BP:           153/84 mmHg Patient Gender: F              HR:           71 bpm. Exam Location:  Inpatient Procedure: 2D Echo, Color Doppler and Cardiac Doppler Indications:    Stroke i63.9  History:        Patient has prior history of Echocardiogram examinations, most                 recent 11/16/2019. CAD; Risk Factors:Hypertension and                 Dyslipidemia. 03/31/14 Bentall Procedure with 53mm Magna Ease                 Bioprosthetic Aortic Valve.                 Aortic Valve: valve is present in the aortic position.  Sonographer:    Raquel Sarna Senior RDCS Referring Phys: Lequita Halt  Sonographer Comments: Technically difficult due to thin body habitus. IMPRESSIONS  1. Left ventricular ejection fraction, by  estimation, is 55 to 60%. The left ventricle has normal function. The left ventricle demonstrates regional wall motion abnormalities (see scoring diagram/findings for description). There is mild concentric left ventricular hypertrophy. Left ventricular diastolic parameters are consistent with Grade II diastolic dysfunction (pseudonormalization). Elevated left atrial pressure.  2. Right ventricular systolic function is mildly reduced. The right ventricular size is normal. There is normal pulmonary artery systolic pressure. The estimated right ventricular systolic pressure is 53.9 mmHg.  3. Left atrial size was mildly dilated.  4. The mitral valve is myxomatous. Mild mitral valve regurgitation.  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  6. The aortic valve has been repaired/replaced. Aortic valve regurgitation is not visualized. No aortic stenosis is present. There is a valve present in the aortic position. Echo findings are consistent with normal structure and function of the aortic valve prosthesis. Aortic valve mean gradient measures 11.0 mmHg. Aortic valve Vmax measures 2.31 m/s. Aortic valve acceleration time measures 49 msec.  7. Aortic root/ascending aorta has been repaired/replaced and dilatation noted. 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 Comparison(s): No significant change from prior study. Prior images reviewed side by side. FINDINGS  Left Ventricle: Left ventricular ejection fraction, by estimation, is 55 to 60%. The left ventricle has normal function. The left ventricle demonstrates regional wall motion abnormalities. The left ventricular internal cavity size was normal in size. There is mild concentric left ventricular hypertrophy. Abnormal (paradoxical)  septal motion consistent with post-operative status. Left ventricular diastolic parameters are consistent with Grade II diastolic dysfunction (pseudonormalization).  Elevated left atrial pressure. Right Ventricle: The right ventricular size is normal. No increase in right ventricular wall thickness. Right ventricular systolic function is mildly reduced. There is normal pulmonary artery systolic pressure. The tricuspid regurgitant velocity is 2.33 m/s, and with an assumed right atrial pressure of 3 mmHg, the estimated right ventricular systolic pressure is 64.3 mmHg. Left Atrium: Left atrial size was mildly dilated. Right Atrium: Right atrial size was normal in size. Prominent Chiari network. Pericardium: There is no evidence of pericardial effusion. Mitral Valve: The mitral valve is myxomatous. Mild mitral valve regurgitation. Tricuspid Valve: 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 Aortic Valve: The aortic valve has been repaired/replaced. Aortic valve regurgitation is not visualized. No aortic stenosis is present. Aortic valve mean gradient measures 11.0 mmHg. Aortic valve peak gradient measures 21.3 mmHg. Aortic valve area, by VTI measures 1.40 cm. There is a valve present in the aortic position. Echo findings are consistent with normal structure and function of the aortic valve prosthesis. Pulmonic Valve: The pulmonic valve was grossly normal. Pulmonic valve regurgitation is not visualized. Aorta: The aortic root and ascending aorta are structurally normal, with no evidence of dilitation, the aortic root/ascending aorta has been repaired/replaced and aortic dilatation noted. 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 IAS/Shunts: No atrial level shunt detected by color flow Doppler.  LEFT VENTRICLE PLAX 2D LVIDd:         3.90 cm   Diastology LVIDs:         2.20 cm   LV e' medial:    5.55 cm/s LV PW:         1.10 cm   LV E/e' medial:  16.6 LV IVS:        1.25 cm   LV e' lateral:   6.09 cm/s LVOT diam:     2.00 cm   LV E/e' lateral: 15.1 LV SV:          54 LV SV Index:   40 LVOT Area:     3.14 cm  RIGHT VENTRICLE RV S prime:     8.81 cm/s TAPSE (M-mode): 1.5 cm LEFT ATRIUM             Index        RIGHT ATRIUM           Index LA diam:        3.20 cm 2.35 cm/m   RA Area:     15.70 cm LA Vol (A2C):   42.7 ml 31.36 ml/m  RA Volume:   33.20 ml  24.38 ml/m LA Vol (A4C):   43.0 ml 31.58 ml/m LA Biplane Vol: 44.2 ml 32.46 ml/m  AORTIC VALVE AV Area (Vmax):    1.23 cm AV Area (Vmean):   1.30 cm AV Area (VTI):     1.40 cm AV Vmax:           231.00 cm/s AV Vmean:          160.000 cm/s AV VTI:            0.387 m AV Peak Grad:      21.3 mmHg AV Mean Grad:      11.0 mmHg LVOT Vmax:         90.80 cm/s LVOT Vmean:  66.100 cm/s LVOT VTI:          0.172 m LVOT/AV VTI ratio: 0.44  AORTA Ao Root diam: 3.40 cm Ao Asc diam:  2.70 cm MITRAL VALVE               TRICUSPID VALVE MV Area (PHT): 3.45 cm    TR Peak grad:   21.7 mmHg MV Decel Time: 220 msec    TR Vmax:        233.00 cm/s MV E velocity: 92.00 cm/s MV A velocity: 68.10 cm/s  SHUNTS MV E/A ratio:  1.35        Systemic VTI:  0.17 m                            Systemic Diam: 2.00 cm Dani Gobble Croitoru MD Electronically signed by Sanda Klein MD Signature Date/Time: 03/15/2022/10:48:38 AM    Final    MR ANGIO NECK WO CONTRAST  Result Date: 03/14/2022 CLINICAL DATA:  Stroke, follow-up. Acute infarct involving the posterior limb right internal capsule. EXAM: MRA NECK WITHOUT CONTRAST TECHNIQUE: Angiographic images of the neck were acquired using MRA technique without intravenous contrast. Carotid stenosis measurements (when applicable) are obtained utilizing NASCET criteria, using the distal internal carotid diameter as the denominator. COMPARISON:  CTA neck 11/16/2019 FINDINGS: The time-of-flight images demonstrate no significant flow disturbance at either carotid bifurcation. Flow is antegrade in the right vertebral artery. No antegrade flow is evident in the left vertebral artery. IMPRESSION: 1. No significant  carotid artery disease. 2. No antegrade flow is evident in the left vertebral artery. Electronically Signed   By: San Morelle M.D.   On: 03/14/2022 17:17   MR ANGIO HEAD WO CONTRAST  Result Date: 03/14/2022 CLINICAL DATA:  Acute infarct scratched at stroke, follow-up. Acute infarct of the posterior limb right internal capsule. EXAM: MRA HEAD WITHOUT CONTRAST TECHNIQUE: Angiographic images of the Circle of Willis were acquired using MRA technique without intravenous contrast. COMPARISON:  MR head without contrast 03/14/2022. MRA of the head 11/15/2019 FINDINGS: Anterior circulation: Extensive atherosclerotic irregularity is present within the cavernous left internal carotid artery. Signal loss is present at the left ICA terminus scratched at signal loss at the left ICA terminus is artifactual at the slab overlap. The right A1 segment is dominant. Signal loss in the distal right M1 segment is artifactual. Mild irregularity is present throughout the ACA and MCA branch vessels without a significant proximal stenosis or occlusion. Posterior circulation: Distal vertebral arteries are incompletely visualized. Basilar artery is within normal limits. Signal loss in the posterior cerebral arteries is artifactual proximally. There is asymmetric attenuation of distal branch vessels on the right. Anatomic variants: None Other: None. IMPRESSION: 1. Significant motion artifact resulting in multiple areas of signal loss. 2. Extensive atherosclerotic changes within the cavernous left internal carotid artery are similar the prior study. 3. Distal small vessel disease without definite proximal stenosis or occlusion. Electronically Signed   By: San Morelle M.D.   On: 03/14/2022 17:15   MR BRAIN WO CONTRAST  Result Date: 03/14/2022 CLINICAL DATA:  Neuro deficit, acute, stroke suspected. Confused and agitated. Left upper and lower extremity weakness. EXAM: MRI HEAD WITHOUT CONTRAST TECHNIQUE: Multiplanar,  multiecho pulse sequences of the brain and surrounding structures were obtained without intravenous contrast. COMPARISON:  CT head without contrast/1/24. MR head and MRA head 11/15/2019 FINDINGS: Brain: An acute nonhemorrhagic infarct of the posterior limb right internal capsule measures up to 9  mm. No other acute infarcts are present. The remainder the study is severely degraded by patient motion. Remote infarct is present in the posterior right lentiform nucleus extending superiorly to the corona radiata, stable. Extensive periventricular and subcortical white matter changes bilaterally are moderately advanced for age. Wallerian degeneration extends into the brainstem. Brainstem and cerebellum are otherwise within normal limits. The ventricles are proportionate to the degree of atrophy. No significant extraaxial fluid collection is present. Vascular: Flow is present in the major intracranial arteries. Skull and upper cervical spine: The craniocervical junction is normal. Upper cervical spine is within normal limits. Marrow signal is unremarkable. Sinuses/Orbits: The paranasal sinuses and mastoid air cells are clear. The globes and orbits are within normal limits. IMPRESSION: 1. Acute nonhemorrhagic infarct of the posterior limb right internal capsule measures up to 9 mm. 2. Remote infarct of the posterior right lentiform nucleus extending superiorly to the corona radiata, stable. 3. Extensive periventricular and subcortical white matter disease bilaterally is moderately advanced for age. This likely reflects the sequela of chronic microvascular ischemia. 4. The image was discontinued due to severe patient motion. MRA could not be completed at this time. The above was relayed via text pager to Dr. Curly Shores On 03/14/2022 at 13:51 . Electronically Signed   By: San Morelle M.D.   On: 03/14/2022 13:52   CT HEAD CODE STROKE WO CONTRAST  Result Date: 03/14/2022 CLINICAL DATA:  Code stroke.  82 year old female  right side deficit. EXAM: CT HEAD WITHOUT CONTRAST TECHNIQUE: Contiguous axial images were obtained from the base of the skull through the vertex without intravenous contrast. RADIATION DOSE REDUCTION: This exam was performed according to the departmental dose-optimization program which includes automated exposure control, adjustment of the mA and/or kV according to patient size and/or use of iterative reconstruction technique. COMPARISON:  Brain MRI 11/15/2019. Head CT 04/14/2018. CTA neck 11/16/2019. FINDINGS: Brain: Cerebral volume remains normal for age. No midline shift, mass effect, or evidence of intracranial mass lesion. No acute intracranial hemorrhage identified. No ventriculomegaly. Patchy and confluent bilateral cerebral white matter hypodensity, with chronic asymmetric involvement of the right corona radiata and lentiform, appears stable. No cortically based acute infarct identified. Vascular: Calcified atherosclerosis at the skull base. No suspicious intracranial vascular hyperdensity. Skull: No acute osseous abnormality identified. Sinuses/Orbits: Visualized paranasal sinuses and mastoids are stable and well aerated. Other: No acute orbit or scalp soft tissue finding. ASPECTS [facility] Stroke Program Early CT Score) Total score (0-10 with 10 being normal): 10 IMPRESSION: 1. No acute cortically based infarct or acute intracranial hemorrhage identified. ASPECTS 10. 2. Stable since 2021 non contrast CT appearance of chronic small vessel disease. 3. These results were communicated to Dr. Curly Shores at 10:37 am on 03/14/2022 by text page via the [facility] messaging system. Electronically Signed   By: Genevie Ann M.D.   On: 03/14/2022 10:38    Pending Labs Unresulted Labs (From admission, onward)     Start     Ordered   03/16/22 0500  CBC  Every 48 hours,   R (with TIMED occurrences)      03/15/22 1356   03/16/22 6720  Basic metabolic panel  Every 48 hours,   R (with TIMED occurrences)      03/15/22 1356    03/14/22 1356  Hemoglobin A1c  (Labs)  Once,   R       Comments: To assess prior glycemic control    03/14/22 1357            Vitals/Pain  Today's Vitals   03/15/22 1500 03/15/22 1515 03/15/22 1520 03/15/22 1619  BP: (!) 165/94     Pulse: (!) 53 (!) 55 68   Resp: (!) 26 19 (!) 21   Temp:    98 F (36.7 C)  TempSrc:      SpO2: 100% 100% 100%   Weight:      Height:      PainSc:        Isolation Precautions No active isolations  Medications Medications  ALPRAZolam (XANAX) tablet 0.125 mg (has no administration in time range)  0.9 %  sodium chloride infusion (0 mLs Intravenous Stopped 03/15/22 0542)  acetaminophen (TYLENOL) tablet 650 mg (has no administration in time range)    Or  acetaminophen (TYLENOL) 160 MG/5ML solution 650 mg (has no administration in time range)    Or  acetaminophen (TYLENOL) suppository 650 mg (has no administration in time range)  senna-docusate (Senokot-S) tablet 1 tablet (has no administration in time range)  enoxaparin (LOVENOX) injection 30 mg (30 mg Subcutaneous Given 03/14/22 1655)  hydrALAZINE (APRESOLINE) injection 5 mg (5 mg Intravenous Given 03/15/22 1151)  LORazepam (ATIVAN) injection 0.5 mg (has no administration in time range)  aspirin tablet 325 mg (325 mg Oral Given 03/15/22 1134)  clopidogrel (PLAVIX) tablet 75 mg (75 mg Oral Given 03/15/22 1133)  sodium chloride flush (NS) 0.9 % injection 3 mL (3 mLs Intravenous Given 03/14/22 1120)  LORazepam (ATIVAN) injection 0.5 mg (0.5 mg Intravenous Given 03/14/22 1246)   stroke: early stages of recovery book ( Does not apply Provided for home use 03/15/22 1304)  potassium chloride SA (KLOR-CON M) CR tablet 40 mEq (40 mEq Oral Given 03/14/22 1655)    Mobility non-ambulatory High fall risk   Focused Assessments Neuro Assessment Handoff:  Swallow screen pass? Yes  Cardiac Rhythm: Atrial fibrillation NIH Stroke Scale ( + Modified Stroke Scale Criteria)  Interval: Shift assessment Level of  Consciousness (1a.)   : Alert, keenly responsive LOC Questions (1b. )   +: Answers both questions correctly LOC Commands (1c. )   + : Performs both tasks correctly Best Gaze (2. )  +: Normal Visual (3. )  +: No visual loss Facial Palsy (4. )    : Partial paralysis  Motor Arm, Left (5a. )   +: No effort against gravity Motor Arm, Right (5b. )   +: No drift Motor Leg, Left (6a. )   +: Some effort against gravity Motor Leg, Right (6b. )   +: No drift Limb Ataxia (7. ): Absent Sensory (8. )   +: Mild-to-moderate sensory loss, patient feels pinprick is less sharp or is dull on the affected side, or there is a loss of superficial pain with pinprick, but patient is aware of being touched Best Language (9. )   +: No aphasia Dysarthria (10. ): Mild-to-moderate dysarthria, patient slurs at least some words and, at worst, can be understood with some difficulty Extinction/Inattention (11.)   +: No Abnormality Modified SS Total  +: 6 Complete NIHSS TOTAL: 9 Last date known well: 03/14/22 Last time known well: 0300 Neuro Assessment: Exceptions to WDL Neuro Checks:   Initial (03/14/22 1030)  Last Documented NIHSS Modified Score: 6 (03/15/22 1500) Has TPA been given? No If patient is a Neuro Trauma and patient is going to OR before floor call report to [place] nurse: [phone] or [phone]   R Recommendations: See Admitting Provider Note  Report given to:   Additional Notes:

## 2022-03-15 NOTE — Evaluation (Signed)
Physical Therapy Evaluation Patient Details Name: Yvonne Ballard MRN: [id number] DOB: May 15, 1940 Today's Date: 03/15/2022  History of Present Illness  Yvonne Ballard is a 82 y.o. female admitted 1/1 presenting with new onset of left-sided weakness. MRI  acute nonhemorrhagic infract of the posterior limb of the right internal capsule.  PMH: TIA, HTN, chronic HFrEF, aortic stenosis status post biosynthetic TAVR, ascending aortic aneurysm, nonobstructive CAD  Clinical Impression  Pt admitted with above diagnosis. Pt mod assist for all mobility at present with impairments with coordination and left UE and LE weakness.  Pt lived alone PTA and doesn't have local family that can provide assist therefore recommend SNF for Rehab prior to d/c home. Will follow acutely.  Pt currently with functional limitations due to balance and endurance deficits. Pt will benefit from skilled PT to increase their independence and safety with mobility to allow discharge to the venue listed below.          Recommendations for follow up therapy are one component of a multi-disciplinary discharge planning process, led by the attending physician.  Recommendations may be updated based on patient status, additional functional criteria and insurance authorization.  Follow Up Recommendations Skilled nursing-short term rehab (<3 hours/day) Can patient physically be transported by private vehicle: Yes    Assistance Recommended at Discharge Frequent or constant Supervision/Assistance  Patient can return home with the following  A lot of help with walking and/or transfers;A little help with bathing/dressing/bathroom;Assistance with cooking/housework;Assist for transportation;Help with stairs or ramp for entrance    Equipment Recommendations None recommended by PT  Recommendations for Other Services       Functional Status Assessment Patient has had a recent decline in their functional status and demonstrates the ability to make  significant improvements in function in a reasonable and predictable amount of time.     Precautions / Restrictions Precautions Precautions: Fall Restrictions Weight Bearing Restrictions: No      Mobility  Bed Mobility Overal bed mobility: Needs Assistance Bed Mobility: Supine to Sit, Sit to Supine     Supine to sit: Min assist Sit to supine: Min assist   General bed mobility comments: Needed assist for safety and technique as pt needs cues to support left UE. May have inattention to left hemibody.    Transfers Overall transfer level: Needs assistance Equipment used: 1 person hand held assist Transfers: Sit to/from Stand, Bed to chair/wheelchair/BSC Sit to Stand: Min assist, Mod assist Stand pivot transfers: Min assist, Mod assist         General transfer comment: Pt requires min to mod assist as she has weakness left hemibody and poor coordination of left hemibodya s well thus affecting balance.    Ambulation/Gait Ambulation/Gait assistance: Mod assist, +2 safety/equipment Gait Distance (Feet): 18 Feet Assistive device: 1 person hand held assist Gait Pattern/deviations: Step-to pattern, Decreased stride length, Shuffle   Gait velocity interpretation: <1.31 ft/sec, indicative of household ambulator   General Gait Details: Pt was able to ambulate a short distance in room with mod assist for support as pt unsteady on feet overall with pt shuffling feet with poor overall coordination as well as flexed posture needing cues to stand upright.  Stairs            Wheelchair Mobility    Modified Rankin (Stroke Patients Only) Modified Rankin (Stroke Patients Only) Pre-Morbid Rankin Score: Slight disability Modified Rankin: Moderately severe disability     Balance Overall balance assessment: Needs assistance Sitting-balance support: Feet supported, Single extremity supported Sitting  balance-Leahy Scale: Poor Sitting balance - Comments: Pt with posterior and  lateral lean needing min asist to sit Postural control: Posterior lean Standing balance support: Single extremity supported, During functional activity Standing balance-Leahy Scale: Poor Standing balance comment: relies on UE support and external support of mod assist to stand                             Pertinent Vitals/Pain Pain Assessment Pain Assessment: No/denies pain    Home Living Family/patient expects to be discharged to:: Private residence Living Arrangements: Alone Available Help at Discharge: Family;Available PRN/intermittently (daughter who is local but has developmental delays and cant really provide care for pt) Type of Home: Independent living facility [facility]) Home Access: [place]: One level Home Equipment: Conservation officer, nature (2 wheels);Tub bench;Cane - single point;Grab bars - tub/shower      Prior Function Prior Level of Function : Independent/Modified Independent             Mobility Comments: Used cane outdoors only per pt.       Hand Dominance   Dominant Hand: Right    Extremity/Trunk Assessment   Upper Extremity Assessment Upper Extremity Assessment: Defer to OT evaluation    Lower Extremity Assessment Lower Extremity Assessment: LLE deficits/detail LLE Deficits / Details: grossly 3-/5 LLE Coordination: decreased fine motor;decreased gross motor    Cervical / Trunk Assessment Cervical / Trunk Assessment: Kyphotic  Communication   Communication: No difficulties  Cognition Arousal/Alertness: Awake/alert Behavior During Therapy: Impulsive, Restless Overall Cognitive Status: Impaired/Different from baseline Area of Impairment: Memory, Following commands, Safety/judgement, Awareness, Problem solving                     Memory: Decreased recall of precautions Following Commands: Follows one step commands with increased time Safety/Judgement: Decreased awareness of safety, Decreased awareness  of deficits   Problem Solving: Slow processing, Decreased initiation, Difficulty sequencing, Requires verbal cues, Requires tactile cues General Comments: Needs cues for safety awareness with some impulsivity noted        General Comments General comments (skin integrity, edema, etc.): Pt stood to pull up diaper and was able to assist with this.  Also was able to put socks on with min assist for balance as well as min assist to help with left UE.    Exercises     Assessment/Plan    PT Assessment Patient needs continued PT services  PT Problem List Decreased activity tolerance;Decreased balance;Decreased mobility;Decreased knowledge of use of DME;Decreased safety awareness;Decreased knowledge of precautions;Cardiopulmonary status limiting activity;Decreased coordination;Impaired sensation       PT Treatment Interventions DME instruction;Functional mobility training;Therapeutic activities;Therapeutic exercise;Patient/family education;Gait training;Cognitive remediation;Neuromuscular re-education;Balance training    PT Goals (Current goals can be found in the Care Plan section)  Acute Rehab PT Goals Patient Stated Goal: to go home PT Goal Formulation: With patient/family Time For Goal Achievement: 03/29/22 Potential to Achieve Goals: Fair    Frequency Min 4X/week     Co-evaluation               AM-PAC PT "6 Clicks" Mobility  Outcome Measure Help needed turning from your back to your side while in a flat bed without using bedrails?: A Lot Help needed moving from lying on your back to sitting on the side of a flat bed without using bedrails?: A Lot Help needed moving to and from a bed to a  chair (including a wheelchair)?: A Lot Help needed standing up from a chair using your arms (e.g., wheelchair or bedside chair)?: A Lot Help needed to walk in hospital room?: A Lot Help needed climbing 3-5 steps with a railing? : A Lot 6 Click Score: 12    End of Session Equipment  Utilized During Treatment: Gait belt Activity Tolerance: Patient limited by fatigue Patient left: with call bell/phone within reach;with family/visitor present (on stretcher) Nurse Communication: Mobility status PT Visit Diagnosis: Unsteadiness on feet (R26.81);Muscle weakness (generalized) (M62.81)    Time: 2449-7530 PT Time Calculation (min) (ACUTE ONLY): 19 min   Charges:   PT Evaluation $PT Eval Moderate Complexity: 1 Mod          Yvonne Ballard,PT Acute Rehab Services [phone]   Yvonne Ballard 03/15/2022, 1:06 PM

## 2022-03-15 NOTE — Evaluation (Signed)
Occupational Therapy Evaluation Patient Details Name: Yvonne Ballard MRN: [id number] DOB: 11-May-1940 Today's Date: 03/15/2022   History of Present Illness Yvonne Ballard is a 82 y.o. female admitted 1/1 presenting with new onset of left-sided weakness. MRI  acute nonhemorrhagic infract of the posterior limb of the right internal capsule.  PMH: TIA, HTN, chronic HFrEF, aortic stenosis status post biosynthetic TAVR, ascending aortic aneurysm, nonobstructive CAD   Clinical Impression   Pt currently min to mod assist for simulated selfcare tasks sit to stand for functional transfers stand pivot.  LUE hemiparesis noted with pt currently demonstrating Brunnstrum stage V-VI movement.  Min assist to incorporate into dressing tasks as an active assist.  Feel she will benefit from acute care OT at this time to help increase overall independence so she can return to PLOF.  Will need short term SNF rehab at discharge secondary to currently needing min to mod assist and not having 24 hr supervision at [facility].        Recommendations for follow up therapy are one component of a multi-disciplinary discharge planning process, led by the attending physician.  Recommendations may be updated based on patient status, additional functional criteria and insurance authorization.      Assistance Recommended at Discharge Frequent or constant Supervision/Assistance  Patient can return home with the following A little help with walking and/or transfers;A little help with bathing/dressing/bathroom;Assistance with cooking/housework;Direct supervision/assist for financial management;Assist for transportation;Help with stairs or ramp for entrance;Direct supervision/assist for medications management;Assistance with feeding    Functional Status Assessment  Patient has had a recent decline in their functional status and demonstrates the ability to make significant improvements in function in a reasonable and predictable  amount of time.  Equipment Recommendations  Other (comment) (TBD next venue of care)    Recommendations for Other Services       Precautions / Restrictions Precautions Precautions: Fall Precaution Comments: mild left hemiparesis Restrictions Weight Bearing Restrictions: No      Mobility Bed Mobility Overal bed mobility: Needs Assistance Bed Mobility: Supine to Sit, Sit to Supine     Supine to sit: Min guard, HOB elevated Sit to supine: Min guard        Transfers Overall transfer level: Needs assistance Equipment used: None Transfers: Sit to/from Stand, Bed to chair/wheelchair/BSC Sit to Stand: Min assist     Step pivot transfers: Mod assist            Balance Overall balance assessment: Needs assistance Sitting-balance support: Feet supported, Single extremity supported Sitting balance-Leahy Scale: Poor Sitting balance - Comments: LOB to the left when long sitting on stretcher to place gripper socks.   Standing balance support: Single extremity supported, During functional activity Standing balance-Leahy Scale: Poor Standing balance comment: Pt needs support from therapist for standing and transfers.                           ADL either performed or assessed with clinical judgement   ADL Overall ADL's : Needs assistance/impaired Eating/Feeding: Supervision/ safety;Sitting   Grooming: Sitting;Minimal assistance Grooming Details (indicate cue type and reason): simulated Upper Body Bathing: Minimal assistance;Sitting Upper Body Bathing Details (indicate cue type and reason): simulated Lower Body Bathing: Minimal assistance;Sit to/from stand Lower Body Bathing Details (indicate cue type and reason): simulated Upper Body Dressing : Minimal assistance;Sitting Upper Body Dressing Details (indicate cue type and reason): simulated Lower Body Dressing: Minimal assistance;Sit to/from stand Lower Body Dressing Details (indicate cue type  and reason):  simulated Toilet Transfer: Moderate assistance;Ambulation;Stand-pivot;BSC/3in1 Armed forces technical officer Details (indicate cue type and reason): simulated         Functional mobility during ADLs: Moderate assistance (no device to ambulate a few steps forward and then backwards) General ADL Comments: Pt with LUE hemiparesis and increased HR and BP.  HR increasing up to 120 with transfer to EOB.  BP in sitting at 174/109 and 175/147.  Limited session secondary to this with return back to supine and nursing made aware.  BP in supine at 177/91.  Pt's daughter present as well.  Discussed need for post acute rehab at SNF level as pt will not have 24 hr supervision at [facility].     Vision Baseline Vision/History:  (right eye visual impairment) Ability to See in Adequate Light: 1 Impaired Patient Visual Report: No change from baseline Vision Assessment?: Vision impaired- to be further tested in functional context Additional Comments: Pt keeps the right eye closed secondary to history of visual loss per her report.     Perception  WFLs   Praxis  WFLs    Pertinent Vitals/Pain Pain Assessment Pain Assessment: Faces Pain Score: 0-No pain     Hand Dominance Right   Extremity/Trunk Assessment Upper Extremity Assessment Upper Extremity Assessment: LUE deficits/detail LUE Deficits / Details: AAROM WFLS for all joints.  Isolated movement noted in all joints but pt with strength at 2+/5 in shoulder, elbow, wrist, and digits.  Decreased ability to maintain wrist extension with digit extension. LUE Sensation: WNL (for light touch only,  proprioception not tested) LUE Coordination: decreased gross motor   Lower Extremity Assessment Lower Extremity Assessment: LLE deficits/detail LLE Deficits / Details: grossly 3-/5 LLE Coordination: decreased fine motor;decreased gross motor   Cervical / Trunk Assessment Cervical / Trunk Assessment: Kyphotic   Communication Communication Communication: No  difficulties   Cognition   Behavior During Therapy: Restless, Impulsive Overall Cognitive Status: Impaired/Different from baseline Area of Impairment: Memory, Attention                   Current Attention Level: Sustained Memory: Decreased short-term memory Following Commands: Follows one step commands with increased time Safety/Judgement: Decreased awareness of safety   Problem Solving: Requires verbal cues, Requires tactile cues General Comments: Pt impulsive and restless throughout session.     General Comments  Pt stood to pull up diaper and was able to assist with this.  Also was able to put socks on with min assist for balance as well as min assist to help with left UE.            Home Living Family/patient expects to be discharged to:: Private residence Living Arrangements: Alone Available Help at Discharge: Family;Available PRN/intermittently Type of Home: Independent living facility Home Access: Elevator     Home Layout: One level     Bathroom Shower/Tub: Occupational psychologist: Standard     Home Equipment: Conservation officer, nature (2 wheels);Tub bench;Cane - single point;Grab bars - tub/shower      Lives With: Alone    Prior Functioning/Environment Prior Level of Function : Independent/Modified Independent             Mobility Comments: Used cane outdoors only per pt. ADLs Comments: modified independent per her report        OT Problem List: Decreased strength;Decreased knowledge of use of DME or AE;Decreased coordination;Decreased range of motion;Decreased cognition;Decreased safety awareness;Impaired balance (sitting and/or standing);Impaired UE functional use  OT Treatment/Interventions: Self-care/ADL training;Patient/family education;Therapeutic exercise;Balance training;Neuromuscular education;Therapeutic activities;DME and/or AE instruction;Cognitive remediation/compensation    OT Goals(Current goals can be found in the care  plan section) Acute Rehab OT Goals Patient Stated Goal: Pt wants to go back home. OT Goal Formulation: With patient/family Time For Goal Achievement: 03/29/22 Potential to Achieve Goals: Good  OT Frequency: Min 2X/week       AM-PAC OT "6 Clicks" Daily Activity     Outcome Measure Help from another person eating meals?: A Little Help from another person taking care of personal grooming?: A Little Help from another person toileting, which includes using toliet, bedpan, or urinal?: A Lot Help from another person bathing (including washing, rinsing, drying)?: A Lot Help from another person to put on and taking off regular upper body clothing?: A Little Help from another person to put on and taking off regular lower body clothing?: A Lot 6 Click Score: 15   End of Session Nurse Communication: Mobility status  Activity Tolerance: Patient limited by fatigue Patient left: in bed;with call bell/phone within reach;with family/visitor present  OT Visit Diagnosis: Unsteadiness on feet (R26.81);Other abnormalities of gait and mobility (R26.89);Muscle weakness (generalized) (M62.81);Hemiplegia and hemiparesis Hemiplegia - Right/Left: Right Hemiplegia - dominant/non-dominant: Dominant Hemiplegia - caused by: Cerebral infarction                Time: 8099-8338 OT Time Calculation (min): 35 min Charges:  OT General Charges $OT Visit: 1 Visit OT Evaluation $OT Eval Moderate Complexity: 1 Mod OT Treatments $Self Care/Home Management : 8-22 mins  Yvonne Ballard OTR/L 03/15/2022, 2:53 PM

## 2022-03-15 NOTE — ED Notes (Signed)
Speech therapy at bedside to evaluate patient at this time.

## 2022-03-15 NOTE — ED Notes (Signed)
Pt up to the restroom with daughter via wheelchair

## 2022-03-15 NOTE — ED Notes (Signed)
PT in with patient.

## 2022-03-15 NOTE — ED Notes (Signed)
Placed patient onto purwick. Changed her into a clean brief. Vitals updated. Daughter is at bedside. Patient is comfortable at this time.

## 2022-03-15 NOTE — Progress Notes (Signed)
Echocardiogram 2D Echocardiogram has been performed.  Oneal Deputy Kazimierz Springborn RDCS 03/15/2022, 9:51 AM

## 2022-03-15 NOTE — Progress Notes (Addendum)
STROKE TEAM PROGRESS NOTE   INTERVAL HISTORY Her daughter is at the bedside.  Patient and family expressed desire to be discharged back to her independent living facility.  Family is requesting Education officer, museum for evaluation of patient's current facility appropriateness for rehab.  Neurologic exam unchanged, patient notes that her left arm is "not getting any better".  Left facial weakness is persistent.  Patient endorses unwillingness to take statin medication. MRI shows 2 small right posterior limb internal capsule infarct.  MR angiogram of the brain and neck motion degraded show some atherosclerotic changes but no major blockages. Vitals:   03/14/22 1945 03/14/22 2025 03/15/22 0038 03/15/22 0606  BP: 138/74  (!) 125/91 (!) 153/84  Pulse: (!) 56  (!) 113 80  Resp: 18  20 16   Temp:  98.6 F (37 C) 98.4 F (36.9 C) 98.4 F (36.9 C)  TempSrc:   Oral Oral  SpO2: 100%  95% 97%  Weight:       CBC:  Recent Labs  Lab 03/14/22 1023 03/14/22 1041  WBC 5.2  --   NEUTROABS 2.5  --   HGB 10.9* 11.2*  HCT 32.7* 33.0*  MCV 91.1  --   PLT 169  --    Basic Metabolic Panel:  Recent Labs  Lab 03/14/22 1023 03/14/22 1041  NA 137 139  K 3.1* 3.1*  CL 102 101  CO2 25  --   GLUCOSE 123* 121*  BUN 11 11  CREATININE 0.69 0.60  CALCIUM 8.6*  --    Lipid Panel: No results for input(s): "CHOL", "TRIG", "HDL", "CHOLHDL", "VLDL", "LDLCALC" in the last 168 hours. HgbA1c: No results for input(s): "HGBA1C" in the last 168 hours. Urine Drug Screen: No results for input(s): "LABOPIA", "COCAINSCRNUR", "LABBENZ", "AMPHETMU", "THCU", "LABBARB" in the last 168 hours.  Alcohol Level  Recent Labs  Lab 03/14/22 1023  ETH <10   IMAGING past 24 hours MR ANGIO NECK WO CONTRAST  Result Date: 03/14/2022 CLINICAL DATA:  Stroke, follow-up. Acute infarct involving the posterior limb right internal capsule. EXAM: MRA NECK WITHOUT CONTRAST TECHNIQUE: Angiographic images of the neck were acquired using MRA  technique without intravenous contrast. Carotid stenosis measurements (when applicable) are obtained utilizing NASCET criteria, using the distal internal carotid diameter as the denominator. COMPARISON:  CTA neck 11/16/2019 FINDINGS: The time-of-flight images demonstrate no significant flow disturbance at either carotid bifurcation. Flow is antegrade in the right vertebral artery. No antegrade flow is evident in the left vertebral artery. IMPRESSION: 1. No significant carotid artery disease. 2. No antegrade flow is evident in the left vertebral artery. Electronically Signed   By: San Morelle M.D.   On: 03/14/2022 17:17   MR ANGIO HEAD WO CONTRAST  Result Date: 03/14/2022 CLINICAL DATA:  Acute infarct scratched at stroke, follow-up. Acute infarct of the posterior limb right internal capsule. EXAM: MRA HEAD WITHOUT CONTRAST TECHNIQUE: Angiographic images of the Circle of Willis were acquired using MRA technique without intravenous contrast. COMPARISON:  MR head without contrast 03/14/2022. MRA of the head 11/15/2019 FINDINGS: Anterior circulation: Extensive atherosclerotic irregularity is present within the cavernous left internal carotid artery. Signal loss is present at the left ICA terminus scratched at signal loss at the left ICA terminus is artifactual at the slab overlap. The right A1 segment is dominant. Signal loss in the distal right M1 segment is artifactual. Mild irregularity is present throughout the ACA and MCA branch vessels without a significant proximal stenosis or occlusion. Posterior circulation: Distal vertebral arteries are incompletely  visualized. Basilar artery is within normal limits. Signal loss in the posterior cerebral arteries is artifactual proximally. There is asymmetric attenuation of distal branch vessels on the right. Anatomic variants: None Other: None. IMPRESSION: 1. Significant motion artifact resulting in multiple areas of signal loss. 2. Extensive atherosclerotic  changes within the cavernous left internal carotid artery are similar the prior study. 3. Distal small vessel disease without definite proximal stenosis or occlusion. Electronically Signed   By: San Morelle M.D.   On: 03/14/2022 17:15   MR BRAIN WO CONTRAST  Result Date: 03/14/2022 CLINICAL DATA:  Neuro deficit, acute, stroke suspected. Confused and agitated. Left upper and lower extremity weakness. EXAM: MRI HEAD WITHOUT CONTRAST TECHNIQUE: Multiplanar, multiecho pulse sequences of the brain and surrounding structures were obtained without intravenous contrast. COMPARISON:  CT head without contrast/1/24. MR head and MRA head 11/15/2019 FINDINGS: Brain: An acute nonhemorrhagic infarct of the posterior limb right internal capsule measures up to 9 mm. No other acute infarcts are present. The remainder the study is severely degraded by patient motion. Remote infarct is present in the posterior right lentiform nucleus extending superiorly to the corona radiata, stable. Extensive periventricular and subcortical white matter changes bilaterally are moderately advanced for age. Wallerian degeneration extends into the brainstem. Brainstem and cerebellum are otherwise within normal limits. The ventricles are proportionate to the degree of atrophy. No significant extraaxial fluid collection is present. Vascular: Flow is present in the major intracranial arteries. Skull and upper cervical spine: The craniocervical junction is normal. Upper cervical spine is within normal limits. Marrow signal is unremarkable. Sinuses/Orbits: The paranasal sinuses and mastoid air cells are clear. The globes and orbits are within normal limits. IMPRESSION: 1. Acute nonhemorrhagic infarct of the posterior limb right internal capsule measures up to 9 mm. 2. Remote infarct of the posterior right lentiform nucleus extending superiorly to the corona radiata, stable. 3. Extensive periventricular and subcortical white matter disease  bilaterally is moderately advanced for age. This likely reflects the sequela of chronic microvascular ischemia. 4. The image was discontinued due to severe patient motion. MRA could not be completed at this time. The above was relayed via text pager to Dr. Curly Shores On 03/14/2022 at 13:51 . Electronically Signed   By: San Morelle M.D.   On: 03/14/2022 13:52   CT HEAD CODE STROKE WO CONTRAST  Result Date: 03/14/2022 CLINICAL DATA:  Code stroke.  82 year old female right side deficit. EXAM: CT HEAD WITHOUT CONTRAST TECHNIQUE: Contiguous axial images were obtained from the base of the skull through the vertex without intravenous contrast. RADIATION DOSE REDUCTION: This exam was performed according to the departmental dose-optimization program which includes automated exposure control, adjustment of the mA and/or kV according to patient size and/or use of iterative reconstruction technique. COMPARISON:  Brain MRI 11/15/2019. Head CT 04/14/2018. CTA neck 11/16/2019. FINDINGS: Brain: Cerebral volume remains normal for age. No midline shift, mass effect, or evidence of intracranial mass lesion. No acute intracranial hemorrhage identified. No ventriculomegaly. Patchy and confluent bilateral cerebral white matter hypodensity, with chronic asymmetric involvement of the right corona radiata and lentiform, appears stable. No cortically based acute infarct identified. Vascular: Calcified atherosclerosis at the skull base. No suspicious intracranial vascular hyperdensity. Skull: No acute osseous abnormality identified. Sinuses/Orbits: Visualized paranasal sinuses and mastoids are stable and well aerated. Other: No acute orbit or scalp soft tissue finding. ASPECTS [facility] Stroke Program Early CT Score) Total score (0-10 with 10 being normal): 10 IMPRESSION: 1. No acute cortically based infarct or acute intracranial  hemorrhage identified. ASPECTS 10. 2. Stable since 2021 non contrast CT appearance of chronic small vessel  disease. 3. These results were communicated to Dr. Curly Shores at 10:37 am on 03/14/2022 by text page via the [facility] messaging system. Electronically Signed   By: Genevie Ann M.D.   On: 03/14/2022 10:38    PHYSICAL EXAM  Physical Exam  Constitutional: Thin irritable elderly Caucasian female, in no acute distress Psych: Affect appropriate to situation, calm and cooperative with exam Eyes: No scleral injection HENT: No OP obstrucion MSK: no joint deformities.  Cardiovascular: Normal rate and regular rhythm.  Respiratory: Effort normal, non-labored breathing on room air  Neuro: Mental Status: Patient is awake, alert, oriented to person, place, month, year, and situation. Patient is able to give a clear and coherent history. No signs of aphasia or neglect.  Patient is minimally dysarthric. She follows commands without difficulty. Cranial Nerves: II: Visual Fields are full. Pupils are equal, round, and reactive to light.   III,IV, VI: EOMI without ptosis or diploplia.  V: Facial sensation is symmetric to touch VII: Left facial asymmetry noted VIII: Hearing is intact to voice X: Palate elevates symmetrically XI: Shoulder shrug is symmetric. XII: Tongue protrudes midline  Motor: Tone is normal. Bulk is normal.  Bilateral lower extremities elevate without vertical drift.  Right upper extremity elevates without vertical drift.  Left upper extremity notes with vertical drift without drift to bed.  Left grip is weak.  Orbits right over left upper extremity.  Lower extremity strength is symmetric. Sensory: Sensation is symmetric to light touch and temperature in the arms and legs. No extinction to DSS present.  Cerebellar: FNF and HKS are intact bilaterally  ASSESSMENT/PLAN [name] is a 82 y.o. female with history of anxiety, thoracic AAA, HTN, CAD, essential tremor, post-operative AF in 2016 not on AC due to procedure with increased risk of pericardial effusion and no further documented AF  since that time, and HLD presenting with left-sided weakness and left facial droop. LKW 1/1 at 03:00.    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 ASPECTS 10.  MRI  acute nonhemorrhagic infract of the posterior limb of the right internal capsule.  MRA head: Significant motion artifact resulting in multiple area of signal loss. Extensive atherosclerotic changes within the cavernous left internal carotid artery are similar to the prior study. Distal small vessel disease without definite proximal stenosis or occlusion.  MRA neck: No significant carotid artery disease. No antegrade flow is evident in the left vertebral artery.  2D Echo LVEF 55 to 60%.  Left ventricle with regional wall motion abnormalities and mild concentric left ventricular hypertrophy.  Left atrial size was mildly dilated. LDL 130 HgbA1c 5.4 VTE prophylaxis - Lovenox 30 mg SQ    Diet   Diet NPO time specified   aspirin 81 mg daily prior to admission, now on aspirin 81 mg daily and clopidogrel 75 mg daily for 21 days followed by clopidogrel monotherapy. Therapy recommendations:  pending Disposition:  pending  Hypertension Home meds: Diltiazem, irbesartan Untable Permissive hypertension (OK if < 220/120) but gradually normalize in 5-7 days Long-term BP goal normotensive  Hyperlipidemia Home meds:  none LDL 130, goal < 70 Add Atorvastatin 40 mg PO daily Patient verbalizes resistance to taking statin medications though she does state she may not be compliant with statin medications. Discussed  the importance of lipid control in stroke prophylaxis.   If patient is noncompliant with statins, may suggest statin alternative like eztimibe or PCSK9 inhibitors. Will need close PCP follow up for further management of hyperlipidemia.  Continue statin at  discharge  Other Stroke Risk Factors Advanced Age >/= 46  Family hx stroke (father) Coronary artery disease  Other Active Problems Essential tremor   Hospital day # 0  Stevi Toberman, AGACNP-BC [facility] Pager: [phone]  I have personally obtained history,examined this patient, reviewed notes, independently viewed imaging studies, participated in medical decision making and plan of care.ROS completed by me personally and pertinent positives fully documented  I have made any additions or clarifications directly to the above note. Agree with note above.  Patient presented with left facial and hand weakness due to right subcortical infarct from small vessel disease.  Recommend dual antiplatelet therapy aspirin and Plavix for 3 weeks followed by Plavix alone and aggressive risk factor modification.  Patient is reluctant to take statins despite being counseled may consider alternatives like Zetia or PCSK9 inhibitor injections is willing.  Discharged home and follow-up as an outpatient in stroke clinic with stroke nurse practitioner in 2 months.  Long discussion with patient and daughter and answered questions.  Discussed with Dr. Avon Gully.  Greater than 50% time during this 50-minute visit was spent in counseling and coordination of care about lacunar stroke and discussion about stroke evaluation, prevention, treatment and answering questions.  Antony Contras, MD Medical Director [facility] Pager: [phone] 03/15/2022 3:44 PM  To contact Stroke Continuity provider, please refer to [email or web address] After hours, contact General Neurology

## 2022-03-15 NOTE — Evaluation (Addendum)
Clinical/Bedside Swallow Evaluation Patient Details  Name: Margaretta Chittum MRN: [id number] Date of Birth: 30-Jan-1941  Today's Date: 03/15/2022 Time: SLP Start Time (ACUTE ONLY): 1330 SLP Stop Time (ACUTE ONLY): 7341 SLP Time Calculation (min) (ACUTE ONLY): 15 min  Past Medical History:  Past Medical History:  Diagnosis Date   Abnormal ECG    Anxiety    pt. reports that she is nervous person   Anxiety disorder    Ascending aortic aneurysm (HCC)    s/p repair with Bental procedure   Coronary artery disease 03/2014   mild non obstructive CAD   Dyslipidemia, goal LDL below 70 07/26/2014   Family history of adverse reaction to anesthesia    Heart murmur    History of hiatal hernia    Hypertension    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HCC)    S/P AVR (aortic valve replacement)    tissue valve   Weight loss    Past Surgical History:  Past Surgical History:  Procedure Laterality Date   BENTALL PROCEDURE N/A 03/31/2014   Procedure: BIO-BENTALL PROCEDURE using a 45mm Magna Ease Aortic Tissue Valve, a 26 mm Terumo Gelweave Valsalva Graft, a 14x10x37mm Terumo Gelweave Graft, and a 84mm straight Hemashield Platinum Graft.;  Surgeon: Ivin Poot, MD;  Location: [place];  Service: Open Heart Surgery;  Laterality: N/A;   LEFT AND RIGHT HEART CATHETERIZATION WITH CORONARY ANGIOGRAM N/A 03/26/2014   Procedure: LEFT AND RIGHT HEART CATHETERIZATION WITH CORONARY ANGIOGRAM;  Surgeon: Peter M Martinique, MD;  Location: [facility] CATH LAB;  Service: Cardiovascular;  Laterality: N/A;   TEE WITHOUT CARDIOVERSION N/A 03/31/2014   Procedure: TRANSESOPHAGEAL ECHOCARDIOGRAM (TEE);  Surgeon: Ivin Poot, MD;  Location: [place];  Service: Open Heart Surgery;  Laterality: N/A;   TONSILLECTOMY     TUBAL LIGATION     HPI:  82yo female admitted 1/1/224 with left side weakness. PMH: TIA, HTN, chronic HFrEF, aortic stenosis s/p biosynthetic TAVR, ascending aortic aneurysm, nonobstructive CAD. MRI = Acute nonhemorrhagic infarct of  the posterior limb right internal capsule.    Assessment / Plan / Recommendation  Clinical Impression  Pt seen at bedside for assessment of swallow function and safety. Pt was awake and alert upon arrival of SLP. RN reports pt passed [place], then later demonstrated anterior leakage/drooling and cough after PO intake, So she requested speech to eval swallowing. RN and SLP repositioned pt top upright position. Pt reports no difficulty swallowing prior to admit. She has adequate natural dentition, and reported she tolerated a regular diet prior to admit. Pt presents with mild-moderate dysarthria, with left orofacial weakness. Pt accepted boluses of thin liquid, puree, and solid textures. No obvious oral issues. No anterior leakage or pocketing noted. No overt s/s aspiration following any texture. Pt's left upper extremity is very weak currently, so will order Dys2 (finely chopped) diet with thin liquids. Discussed this with pt, and she is in agreement with this diet. SLP will follow to assess diet tolerance and readiness to advance solid textures. Daughter arrived later, and was receptive to education regarding rationale for diet and SLP follow up.   SLP Visit Diagnosis: Dysphagia, unspecified (R13.10)    Aspiration Risk  Mild aspiration risk    Diet Recommendation Dysphagia 2 (Fine chop);Thin liquid   Liquid Administration via: Straw;Cup Medication Administration: Whole meds with puree Supervision: Patient able to self feed;Intermittent supervision to cue for compensatory strategies;Staff to assist with self feeding Compensations: Minimize environmental distractions;Slow rate;Small sips/bites Postural Changes: Seated upright at 90 degrees  Other  Recommendations Oral Care Recommendations: Oral care BID    Recommendations for follow up therapy are one component of a multi-disciplinary discharge planning process, led by the attending physician.  Recommendations may be updated based on patient  status, additional functional criteria and insurance authorization.  Follow up Recommendations Other (comment) (TBD)      Assistance Recommended at Discharge  TBD  Functional Status Assessment Patient has had a recent decline in their functional status and demonstrates the ability to make significant improvements in function in a reasonable and predictable amount of time.  Frequency and Duration min 1 x/week  1 week;2 weeks       Prognosis Prognosis for Safe Diet Advancement: Fair      Swallow Study   General Date of Onset: 03/14/22 HPI: 82yo female admitted 03/14/22 with left side weakness. PMH: TIA, HTN, chronic HFrEF, aortic stenosis s/p biosynthetic TAVR, ascending aortic aneurysm, nonobstructive CAD. MRI = Acute nonhemorrhagic infarct of the posterior limb right internal capsule. Type of Study: Bedside Swallow Evaluation Previous Swallow Assessment: BSE 01/2017 - reg/thin, no f/u Diet Prior to this Study: Regular;Thin liquids Temperature Spikes Noted: No Respiratory Status: Room air History of Recent Intubation: No Behavior/Cognition: Alert;Cooperative;Pleasant mood Oral Cavity Assessment: Within Functional Limits Oral Care Completed by SLP: No Oral Cavity - Dentition: Adequate natural dentition Vision: Functional for self-feeding Self-Feeding Abilities: Able to feed self;Needs assist;Needs set up Patient Positioning: Upright in bed Baseline Vocal Quality: Normal Volitional Cough: Strong Volitional Swallow: Able to elicit    Oral/Motor/Sensory Function Overall Oral Motor/Sensory Function: Mild impairment Facial ROM: Reduced left Facial Symmetry: Abnormal symmetry left Facial Strength: Reduced left Facial Sensation: Within Functional Limits Lingual ROM: Within Functional Limits Lingual Symmetry: Within Functional Limits Lingual Strength: Reduced Lingual Sensation: Within Functional Limits Mandible: Within Functional Limits   Ice Chips Ice chips: Not tested   Thin  Liquid Thin Liquid: Within functional limits Presentation: Straw    Nectar Thick Nectar Thick Liquid: Not tested   Honey Thick Honey Thick Liquid: Not tested   Puree Puree: Within functional limits Presentation: Spoon   Solid     Solid: Impaired Oral Phase Impairments: Impaired mastication Oral Phase Functional Implications: Prolonged oral transit;Impaired mastication     Shriley Joffe B. Quentin Ore, [facility], [place] Speech Language Pathologist Office: [phone]  Shonna Chock 03/15/2022,2:36 PM

## 2022-03-15 NOTE — ED Notes (Signed)
Pt transported to echo 

## 2022-03-16 DIAGNOSIS — E876 Hypokalemia: Secondary | ICD-10-CM

## 2022-03-16 DIAGNOSIS — I6300 Cerebral infarction due to thrombosis of unspecified precerebral artery: Secondary | ICD-10-CM | POA: Diagnosis not present

## 2022-03-16 DIAGNOSIS — I10 Essential (primary) hypertension: Secondary | ICD-10-CM

## 2022-03-16 LAB — BASIC METABOLIC PANEL
Anion gap: 9 (ref 5–15)
BUN: 11 mg/dL (ref 8–23)
CO2: 23 mmol/L (ref 22–32)
Calcium: 8.4 mg/dL — ABNORMAL LOW (ref 8.9–10.3)
Chloride: 105 mmol/L (ref 98–111)
Creatinine, Ser: 0.63 mg/dL (ref 0.44–1.00)
GFR, Estimated: >60 mL/min (ref >60)
Glucose, Bld: 97 mg/dL (ref 70–99)
Potassium: 3.3 mmol/L — ABNORMAL LOW (ref 3.5–5.1)
Sodium: 137 mmol/L (ref 135–145)

## 2022-03-16 LAB — CBC
HCT: 33.4 % — ABNORMAL LOW (ref 36.0–46.0)
Hemoglobin: 11.3 g/dL — ABNORMAL LOW (ref 12.0–15.0)
MCH: 30.4 pg (ref 26.0–34.0)
MCHC: 33.8 g/dL (ref 30.0–36.0)
MCV: 89.8 fL (ref 80.0–100.0)
Platelets: 171 10*3/uL (ref 150–400)
RBC: 3.72 MIL/uL — ABNORMAL LOW (ref 3.87–5.11)
RDW: 14 % (ref 11.5–15.5)
WBC: 5.9 10*3/uL (ref 4.0–10.5)
nRBC: 0 % (ref 0.0–0.2)

## 2022-03-16 MED ORDER — POTASSIUM CHLORIDE CRYS ER 20 MEQ PO TBCR
40.0000 meq | EXTENDED_RELEASE_TABLET | Freq: Once | ORAL | Status: AC
  Administered 2022-03-16: 40 meq via ORAL
  Filled 2022-03-16: qty 2

## 2022-03-16 NOTE — NC FL2 (Signed)
[place] LEVEL OF CARE FORM     IDENTIFICATION  Patient Name: Verlie Liotta Birthdate: 09-22-1940 Sex: female Admission Date (Current Location): 03/14/2022  [facility] and [place] Number:  Herbalist and Address:  [facility], [place] [address] [place], [place] [address]      Provider Number: [id number]  Attending Physician Name and Address:  Bonnielee Haff, MD  Relative Name and Phone Number:       Current Level of Care: Hospital Recommended Level of Care: [place] Prior Approval Number:    Date Approved/Denied:   PASRR Number: pending  Discharge Plan: SNF    Current Diagnoses: Patient Active Problem List   Diagnosis Date Noted   Acute CVA (cerebrovascular accident) ([place]) 03/15/2022   Stroke ([place]) 03/14/2022   CRAO (central retinal artery occlusion), right 11/15/2019   Weight loss    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[place])    Hypertension    History of hiatal hernia    Family history of adverse reaction to anesthesia    Ascending aortic aneurysm ([place])    Anxiety disorder    Abnormal ECG    Anxiety 02/07/2017   Hiatal hernia 02/07/2017   Stroke (cerebrum) ([place]) 02/07/2017   CAD (coronary artery disease), native coronary artery 07/26/2014   Dyslipidemia, goal LDL below 70 07/26/2014   S/P AVR (aortic valve replacement)    Aortic aneurysm, thoracic ([place]) 03/31/2014   Benign essential HTN 03/18/2014   LVH (left ventricular hypertrophy) due to hypertensive disease 03/18/2014   Heart murmur 03/18/2014   Coronary artery disease 03/14/2014    Orientation RESPIRATION BLADDER Height & Weight     Self, Time, Situation, Place  Normal Continent Weight: 43.4 kg Height:  5' (152.4 cm)  BEHAVIORAL SYMPTOMS/MOOD NEUROLOGICAL BOWEL NUTRITION STATUS      Continent Diet (dysphagia 2 with thin liquids)  AMBULATORY STATUS COMMUNICATION OF NEEDS Skin   Limited Assist Verbally Bruising (Bruising to left knee/ buttocks  red)                       Personal Care Assistance Level of Assistance  Bathing, Feeding, Dressing Bathing Assistance: Limited assistance Feeding assistance: Independent Dressing Assistance: Limited assistance     Functional Limitations Info  Sight, Hearing, Speech Sight Info: Impaired (rt eye red and decreased vision) Hearing Info: Impaired Speech Info: Adequate    SPECIAL CARE FACTORS FREQUENCY  PT (By licensed PT), OT (By licensed OT), Speech therapy     PT Frequency: 5x/wk OT Frequency: 5x/wk     Speech Therapy Frequency: 5x/wk      Contractures Contractures Info: Not present    Additional Factors Info  Code Status, Allergies Code Status Info: Full Allergies Info: NKA           Current Medications (03/16/2022):  This is the current hospital active medication list Current Facility-Administered Medications  Medication Dose Route Frequency Provider Last Rate Last Admin   acetaminophen (TYLENOL) tablet 650 mg  650 mg Oral Q4H PRN Wynetta Fines T, MD       Or   acetaminophen (TYLENOL) 160 MG/5ML solution 650 mg  650 mg Per Tube Q4H PRN Wynetta Fines T, MD       Or   acetaminophen (TYLENOL) suppository 650 mg  650 mg Rectal Q4H PRN Lequita Halt, MD       ALPRAZolam Duanne Moron) tablet 0.125 mg  0.125 mg Oral Daily PRN Lequita Halt, MD  aspirin tablet 325 mg  325 mg Oral Daily Bhagat, Srishti L, MD   325 mg at 03/16/22 8850   clopidogrel (PLAVIX) tablet 75 mg  75 mg Oral Daily Wynetta Fines T, MD   75 mg at 03/16/22 2774   enoxaparin (LOVENOX) injection 30 mg  30 mg Subcutaneous Q24H Wynetta Fines T, MD   30 mg at 03/15/22 2057   hydrALAZINE (APRESOLINE) injection 5 mg  5 mg Intravenous Q6H PRN Wynetta Fines T, MD   5 mg at 03/15/22 1151   LORazepam (ATIVAN) injection 0.5 mg  0.5 mg Intravenous Once PRN Bhagat, Srishti L, MD       senna-docusate (Senokot-S) tablet 1 tablet  1 tablet Oral QHS PRN Lequita Halt, MD         Discharge Medications: Please see discharge  summary for a list of discharge medications.  Relevant Imaging Results:  Relevant Lab Results:   Additional Information SS#: [id number]  Pollie Friar, RN

## 2022-03-16 NOTE — Progress Notes (Signed)
Physical Therapy Treatment Patient Details Name: Yvonne Ballard MRN: [id number] DOB: [birth year] Today's Date: 03/16/2022   History of Present Illness Yvonne Ballard is a 82 y.o. female admitted 1/1 presenting with new onset of left-sided weakness. MRI  acute nonhemorrhagic infract of the posterior limb of the right internal capsule.  PMH: TIA, HTN, chronic HFrEF, aortic stenosis status post biosynthetic TAVR, ascending aortic aneurysm, nonobstructive CAD    PT Comments    Received pt siting upright in bed with daughter present at bedside. Pt required min A overall for bed mobility for LUE management and cues for L attention. Pt slightly impulsive, but required min A to stand without AD from EOB. Pt then ambulated 95ft with RW and min A through hallway - cues for upright posture. Pt left in recliner with all needs within reach. Reviewed exercises to work on for LUE with emphasis on slow controlled movements to promote functional use of LUE. Recommend short stay at SNF at this time. Acute PT to cont to follow.     Recommendations for follow up therapy are one component of a multi-disciplinary discharge planning process, led by the attending physician.  Recommendations may be updated based on patient status, additional functional criteria and insurance authorization.  Follow Up Recommendations  Skilled nursing-short term rehab (<3 hours/day) Can patient physically be transported by private vehicle: Yes   Assistance Recommended at Discharge Frequent or constant Supervision/Assistance  Patient can return home with the following A little help with bathing/dressing/bathroom;Assistance with cooking/housework;Assist for transportation;Help with stairs or ramp for entrance;A little help with walking and/or transfers;Direct supervision/assist for financial management;Direct supervision/assist for medications management   Equipment Recommendations  Rolling walker (2 wheels)    Recommendations for Other  Services       Precautions / Restrictions Precautions Precautions: Fall Precaution Comments: mild left hemiparesis Restrictions Weight Bearing Restrictions: No     Mobility  Bed Mobility Overal bed mobility: Needs Assistance Bed Mobility: Rolling, Supine to Sit Rolling: Supervision   Supine to sit: Min assist, HOB elevated     General bed mobility comments: Needed assist for LUE managment and cues for attention Patient Response: Impulsive, Cooperative  Transfers Overall transfer level: Needs assistance Equipment used: None Transfers: Sit to/from Stand Sit to Stand: Min assist           General transfer comment: Pt attempting to stand multiple times before therapist was ready, ultimately required min A to stand.    Ambulation/Gait Ambulation/Gait assistance: Min assist Gait Distance (Feet): 80 Feet Assistive device: Rolling walker (2 wheels) Gait Pattern/deviations: Step-to pattern, Step-through pattern, Decreased stride length, Narrow base of support Gait velocity: decreased Gait velocity interpretation: <1.31 ft/sec, indicative of household ambulator   General Gait Details: Pt able to maintain LUE grip on RW with min guard/very light min A. Required cues for upright posture and for focused attention while ambulating   Stairs             Wheelchair Mobility    Modified Rankin (Stroke Patients Only)       Balance Overall balance assessment: Needs assistance Sitting-balance support: Feet supported, Single extremity supported Sitting balance-Leahy Scale: Fair Sitting balance - Comments: able to maintain static sitting balance with close supervision but required cues for attention to LUE   Standing balance support: Bilateral upper extremity supported (RW) Standing balance-Leahy Scale: Poor Standing balance comment: required min guard for static standing balance and min A for dynamic standing balance  Cognition  Arousal/Alertness: Awake/alert Behavior During Therapy: Restless, Impulsive Overall Cognitive Status: Impaired/Different from baseline                       Memory: Decreased short-term memory Following Commands: Follows one step commands with increased time, Follows multi-step commands with increased time Safety/Judgement: Decreased awareness of safety   Problem Solving: Requires verbal cues General Comments: pt slightly impulsive, attempting to stand before therapist could get gait belt on        Exercises      General Comments General comments (skin integrity, edema, etc.): pt's daughter from [place] present at bedside.      Pertinent Vitals/Pain Pain Assessment Pain Assessment: No/denies pain    Home Living                          Prior Function            PT Goals (current goals can now be found in the care plan section) Acute Rehab PT Goals Patient Stated Goal: to go home PT Goal Formulation: With patient/family Time For Goal Achievement: 03/29/22 Potential to Achieve Goals: Fair Progress towards PT goals: Progressing toward goals    Frequency    Min 4X/week      PT Plan Current plan remains appropriate    Co-evaluation              AM-PAC PT "6 Clicks" Mobility   Outcome Measure  Help needed turning from your back to your side while in a flat bed without using bedrails?: A Little Help needed moving from lying on your back to sitting on the side of a flat bed without using bedrails?: A Lot Help needed moving to and from a bed to a chair (including a wheelchair)?: A Little Help needed standing up from a chair using your arms (e.g., wheelchair or bedside chair)?: A Little Help needed to walk in hospital room?: A Little Help needed climbing 3-5 steps with a railing? : A Lot 6 Click Score: 16    End of Session Equipment Utilized During Treatment: Gait belt Activity Tolerance: Patient tolerated treatment well Patient left:  with call bell/phone within reach;in chair;with chair alarm set;with family/visitor present Nurse Communication: Mobility status PT Visit Diagnosis: Unsteadiness on feet (R26.81);Muscle weakness (generalized) (M62.81);Hemiplegia and hemiparesis Hemiplegia - Right/Left: Left Hemiplegia - dominant/non-dominant: Non-dominant     Time: 9735-3299 PT Time Calculation (min) (ACUTE ONLY): 26 min  Charges:  $Gait Training: 8-22 mins $Therapeutic Activity: 8-22 mins                     Becky Sax PT, DPT  Blenda Nicely 03/16/2022, 11:10 AM

## 2022-03-16 NOTE — TOC Initial Note (Addendum)
Transition of Care [facility]) - Initial/Assessment Note    Patient Details  Name: Yvonne Ballard MRN: [id number] Date of Birth: 1940-05-24  Transition of Care [facility]) CM/SW Contact:    Pollie Friar, RN Phone Number: 03/16/2022, 12:06 PM  Clinical Narrative:                 Pt is from [place]. Current recommendations are for SNF rehab. CM met with the patient and her daughter, Nunzio Cory. They are in agreement to having pt faxed out in the [facility] area. Will provide bed offers to daughter and pt later today as the daughter will be going to [place] early tomorrow am.  TOC following.  1400: CM has left bed offers in the patients room for the daughter to review. CM has sent required information to [facility] for [facility]. It is under manual review.   Expected Discharge Plan: Skilled Nursing Facility Barriers to Discharge: Continued Medical Work up   Patient Goals and CMS Choice   CMS Medicare.gov Compare Post Acute Care list provided to:: Patient Represenative (must comment) Choice offered to / list presented to : Patient, Adult Children      Expected Discharge Plan and Services In-house Referral: Clinical Social Work Discharge Planning Services: CM Consult Post Acute Care Choice: [place] Living arrangements for the past 2 months: Apartment                                      Prior Living Arrangements/Services Living arrangements for the past 2 months: Apartment Lives with:: Self Patient language and need for interpreter reviewed:: Yes Do you feel safe going back to the place where you live?: Yes      Need for Family Participation in Patient Care: Yes (Comment) Care giver support system in place?: No (comment)   Criminal Activity/Legal Involvement Pertinent to Current Situation/Hospitalization: No - Comment as needed  Activities of Daily Living Home Assistive Devices/Equipment: Cane (specify quad or straight) (straight and prongs,) ADL  Screening (condition at time of admission) Patient's cognitive ability adequate to safely complete daily activities?: Yes Is the patient deaf or have difficulty hearing?: No Does the patient have difficulty seeing, even when wearing glasses/contacts?: Yes Does the patient have difficulty concentrating, remembering, or making decisions?: No Patient able to express need for assistance with ADLs?: Yes Does the patient have difficulty dressing or bathing?: No Independently performs ADLs?: Yes (appropriate for developmental age) Does the patient have difficulty walking or climbing stairs?: No Weakness of Legs: Left Weakness of Arms/Hands: Left  Permission Sought/Granted                  Emotional Assessment Appearance:: Appears stated age     Orientation: : Oriented to Self, Oriented to Place, Oriented to  Time, Oriented to Situation   Psych Involvement: No (comment)  Admission diagnosis:  Stroke [facility]) [I63.9] Acute CVA (cerebrovascular accident) ([place]) [I63.9] Cerebrovascular accident (CVA), unspecified mechanism ([place]) [I63.9] Patient Active Problem List   Diagnosis Date Noted   Acute CVA (cerebrovascular accident) ([place]) 03/15/2022   Stroke ([place]) 03/14/2022   CRAO (central retinal artery occlusion), right 11/15/2019   Weight loss    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[place])    Hypertension    History of hiatal hernia    Family history of adverse reaction to anesthesia    Ascending aortic aneurysm (HCC)    Anxiety disorder    Abnormal  ECG    Anxiety 02/07/2017   Hiatal hernia 02/07/2017   Stroke (cerebrum) ([place]) 02/07/2017   CAD (coronary artery disease), native coronary artery 07/26/2014   Dyslipidemia, goal LDL below 70 07/26/2014   S/P AVR (aortic valve replacement)    Aortic aneurysm, 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[place]) 03/31/2014   Benign essential HTN 03/18/2014   LVH (left ventricular hypertrophy) due to hypertensive disease 03/18/2014   Heart murmur 03/18/2014   Coronary artery disease  03/14/2014   PCP:  Kelton Pillar, MD Pharmacy:   [facility] [place], [place] [address] AT [facility] [address] [place] [address] Phone: [phone] Fax: [phone]  [facility] [address], [place] [address] Phone: [phone] Fax: [phone]     Social Determinants of Health (SDOH) Social History: [place]: No Food Insecurity (03/15/2022)  Housing: Low Risk  (03/15/2022)  Transportation Needs: No Transportation Needs (03/15/2022)  Utilities: Not At Risk (03/15/2022)  Tobacco Use: Low Risk  (03/14/2022)   SDOH Interventions:     Readmission Risk Interventions     No data to display

## 2022-03-16 NOTE — Plan of Care (Signed)
  Problem: Education: Goal: Knowledge of disease or condition will improve Outcome: Progressing Goal: Knowledge of secondary prevention will improve (MUST DOCUMENT ALL) Outcome: Progressing Goal: Knowledge of patient specific risk factors will improve [place] N/A or DELETE if not current risk factor) Outcome: Progressing   Problem: Ischemic Stroke/TIA Tissue Perfusion: Goal: Complications of ischemic stroke/TIA will be minimized Outcome: Progressing   Problem: Coping: Goal: Will verbalize positive feelings about self Outcome: Progressing Goal: Will identify appropriate support needs Outcome: Progressing   Problem: Health Behavior/Discharge Planning: Goal: Ability to manage health-related needs will improve Outcome: Progressing Goal: Goals will be collaboratively established with patient/family Outcome: Progressing   Problem: Self-Care: Goal: Ability to participate in self-care as condition permits will improve Outcome: Progressing Goal: Verbalization of feelings and concerns over difficulty with self-care will improve Outcome: Progressing   Problem: Nutrition: Goal: Risk of aspiration will decrease Outcome: Progressing Goal: Dietary intake will improve Outcome: Progressing   Problem: Education: Goal: Knowledge of General Education information will improve Description: Including pain rating scale, medication(s)/side effects and non-pharmacologic comfort measures Outcome: Progressing   Problem: Health Behavior/Discharge Planning: Goal: Ability to manage health-related needs will improve Outcome: Progressing   Problem: Clinical Measurements: Goal: Ability to maintain clinical measurements within normal limits will improve Outcome: Progressing Goal: Will remain free from infection Outcome: Progressing Goal: Diagnostic test results will improve Outcome: Progressing Goal: Cardiovascular complication will be avoided Outcome: Progressing   Problem: Activity: Goal: Risk  for activity intolerance will decrease Outcome: Progressing   Problem: Nutrition: Goal: Adequate nutrition will be maintained Outcome: Progressing   Problem: Coping: Goal: Level of anxiety will decrease Outcome: Progressing   Problem: Elimination: Goal: Will not experience complications related to bowel motility Outcome: Progressing Goal: Will not experience complications related to urinary retention Outcome: Progressing   Problem: Pain Managment: Goal: General experience of comfort will improve Outcome: Progressing   Problem: Safety: Goal: Ability to remain free from injury will improve Outcome: Progressing   Problem: Skin Integrity: Goal: Risk for impaired skin integrity will decrease Outcome: Progressing   Problem: Self-Care: Goal: Ability to communicate needs accurately will improve Outcome: Completed/Met   Problem: Clinical Measurements: Goal: Respiratory complications will improve Outcome: Not Applicable

## 2022-03-16 NOTE — Progress Notes (Signed)
PROGRESS NOTE    [name]  TKW:[id number] DOB: 20-Oct-1940 DOA: 03/14/2022 PCP: Kelton Pillar, MD   Brief Narrative:  Yvonne Ballard is a 82 y.o. female with medical history significant of TIA, HTN, chronic HFrEF, aortic stenosis status post biosynthetic TAVR, ascending aortic aneurysm, nonobstructive CAD, presented with new onset of left-sided weakness.  Patient admitted as above with acute onset left-sided weakness, MRI consistent with acute CVA.  Patient currently lives in an independent living facility.   Assessment & Plan:   Acute CVA Seen by neurology.  Found to have a stroke in the right internal capsule.  Patient does have left-sided weakness. Neurology is recommending aspirin and Plavix for 3 weeks followed by Plavix alone. LDL 130.  HbA1c 5.4 Patient resistant to taking statin medications.  Atorvastatin was initially initiated by neurology but she has refused this.  Alternatives such as Zetia or PCSK9 inhibitor can be considered.  Will discuss Zetia with patient.  If she again is hesitant then this will need to be pursued in the outpatient setting. Patient seen by physical therapy.  Skilled nursing facility is recommended for rehab.  Social worker is following. Seen by speech therapy.  Dysphagia 2 diet is recommended.  Essential hypertension -Permissive hypertension, goal normotension in the next 48 to 72 hours. Prior to admission patient was noted to be on Avapro and Cardizem.  Hypokalemia Will be repleted.  Normocytic anemia No evidence of overt bleeding.  Continue to monitor.  History of thoracic aortic aneurysm Aortic stenosis status post biosynthetic TAVR Stable.  Chronic heart failure with reduced ejection fraction -EF 55 to 60% during this hospitalization with left ventricle regional wall motion abnormalities and mild hypertrophy -Bioprosthetic aortic valve without stenosis or regurgitation  DVT prophylaxis: Lovenox Code Status: Full Family  Communication: No family at bedside Disposition: SNF  Status is: Inpatient Remains inpatient appropriate because: Acute stroke   Consultants:  Neurology  Procedures:  None  Antimicrobials:  None indicated  Subjective: Continues to have left-sided weakness.  No other complaints offered.  Objective: Vitals:   03/15/22 2052 03/15/22 2337 03/16/22 0336 03/16/22 0729  BP:  (!) 150/86 132/89 (!) 155/95  Pulse:   (!) 101 88  Resp:  15 20 20   Temp:  98.5 F (36.9 C) 98.4 F (36.9 C) 98 F (36.7 C)  TempSrc:  Oral Oral Oral  SpO2:  98% 95% 94%  Weight: 43.4 kg     Height: 5' (1.524 m)      No intake or output data in the 24 hours ending 03/16/22 1045  Filed Weights   03/14/22 1027 03/15/22 1201 03/15/22 2052  Weight: 43.2 kg 43.2 kg 43.4 kg    Examination:  General appearance: Awake alert.  In no distress Resp: Clear to auscultation bilaterally.  Normal effort Cardio: S1-S2 is normal regular.  No S3-S4.  No rubs murmurs or bruit GI: Abdomen is soft.  Nontender nondistended.  Bowel sounds are present normal.  No masses organomegaly Extremities: No edema.  Full range of motion of lower extremities. Neurologic: Left-sided weakness noted.   Data Reviewed: I have personally reviewed following labs and imaging studies  CBC: Recent Labs  Lab 03/14/22 1023 03/14/22 1041 03/16/22 0555  WBC 5.2  --  5.9  NEUTROABS 2.5  --   --   HGB 10.9* 11.2* 11.3*  HCT 32.7* 33.0* 33.4*  MCV 91.1  --  89.8  PLT 169  --  924    Basic Metabolic Panel: Recent Labs  Lab 03/14/22 1023  03/14/22 1041 03/16/22 0555  NA 137 139 137  K 3.1* 3.1* 3.3*  CL 102 101 105  CO2 25  --  23  GLUCOSE 123* 121* 97  BUN 11 11 11   CREATININE 0.69 0.60 0.63  CALCIUM 8.6*  --  8.4*    GFR: Estimated Creatinine Clearance: 36.5 mL/min (by C-G formula based on SCr of 0.63 mg/dL).  Liver Function Tests: Recent Labs  Lab 03/14/22 1023  AST 26  ALT 17  ALKPHOS 63  BILITOT 0.6  PROT 6.1*   ALBUMIN 3.7     Coagulation Profile: Recent Labs  Lab 03/14/22 1023  INR 1.1     HbA1C: Recent Labs    03/14/22 1430  HGBA1C 5.7*   CBG: Recent Labs  Lab 03/14/22 1022  GLUCAP 118*    Lipid Profile: Recent Labs    03/14/22 1023  CHOL 216*  HDL 77  LDLCALC 127*  TRIG 61  CHOLHDL 2.8   Radiology Studies: ECHOCARDIOGRAM COMPLETE  Result Date: 03/15/2022    ECHOCARDIOGRAM REPORT   Patient Name:   Yvonne Ballard Date of Exam: 03/15/2022 Medical Rec #:  [id number]      Height:       60.0 in Accession #:    [id number]     Weight:       95.2 lb Date of Birth:  February 18, 1941     BSA:          1.362 m Patient Age:    15 years       BP:           153/84 mmHg Patient Gender: F              HR:           71 bpm. Exam Location:  Inpatient Procedure: 2D Echo, Color Doppler and Cardiac Doppler Indications:    Stroke i63.9  History:        Patient has prior history of Echocardiogram examinations, most                 recent 11/16/2019. CAD; Risk Factors:Hypertension and                 Dyslipidemia. 03/31/14 [place] Procedure with 1mm Magna Ease                 Bioprosthetic Aortic Valve.                 Aortic Valve: valve is present in the aortic position.  Sonographer:    Raquel Sarna Senior RDCS Referring Phys: Lequita Halt  Sonographer Comments: Technically difficult due to thin body habitus. IMPRESSIONS  1. Left ventricular ejection fraction, by estimation, is 55 to 60%. The left ventricle has normal function. The left ventricle demonstrates regional wall motion abnormalities (see scoring diagram/findings for description). There is mild concentric left ventricular hypertrophy. Left ventricular diastolic parameters are consistent with Grade II diastolic dysfunction (pseudonormalization). Elevated left atrial pressure.  2. Right ventricular systolic function is mildly reduced. The right ventricular size is normal. There is normal pulmonary artery systolic pressure. The estimated right ventricular  systolic pressure is 87.5 mmHg.  3. Left atrial size was mildly dilated.  4. The mitral valve is myxomatous. Mild mitral valve regurgitation.  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  6. The aortic valve has been repaired/replaced. Aortic valve regurgitation is not visualized. No aortic stenosis is present. There is a valve present in the aortic  position. Echo findings are consistent with normal structure and function of the aortic valve prosthesis. Aortic valve mean gradient measures 11.0 mmHg. Aortic valve Vmax measures 2.31 m/s. Aortic valve acceleration time measures 49 msec.  7. Aortic root/ascending aorta has been repaired/replaced and dilatation noted. 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 Comparison(s): No significant change from prior study. Prior images reviewed side by side. FINDINGS  Left Ventricle: Left ventricular ejection fraction, by estimation, is 55 to 60%. The left ventricle has normal function. The left ventricle demonstrates regional wall motion abnormalities. The left ventricular internal cavity size was normal in size. There is mild concentric left ventricular hypertrophy. Abnormal (paradoxical) septal motion consistent with post-operative status. Left ventricular diastolic parameters are consistent with Grade II diastolic dysfunction (pseudonormalization). Elevated left atrial pressure. Right Ventricle: The right ventricular size is normal. No increase in right ventricular wall thickness. Right ventricular systolic function is mildly reduced. There is normal pulmonary artery systolic pressure. The tricuspid regurgitant velocity is 2.33 m/s, and with an assumed right atrial pressure of 3 mmHg, the estimated right ventricular systolic pressure is 33.5 mmHg. Left Atrium: Left atrial size was mildly dilated. Right Atrium: Right atrial size was normal in size. Prominent Chiari network. Pericardium: There is no  evidence of pericardial effusion. Mitral Valve: The mitral valve is myxomatous. Mild mitral valve regurgitation. Tricuspid Valve: 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 Aortic Valve: The aortic valve has been repaired/replaced. Aortic valve regurgitation is not visualized. No aortic stenosis is present. Aortic valve mean gradient measures 11.0 mmHg. Aortic valve peak gradient measures 21.3 mmHg. Aortic valve area, by VTI measures 1.40 cm. There is a valve present in the aortic position. Echo findings are consistent with normal structure and function of the aortic valve prosthesis. Pulmonic Valve: The pulmonic valve was grossly normal. Pulmonic valve regurgitation is not visualized. Aorta: The aortic root and ascending aorta are structurally normal, with no evidence of dilitation, the aortic root/ascending aorta has been repaired/replaced and aortic dilatation noted. 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 IAS/Shunts: No atrial level shunt detected by color flow Doppler.  LEFT VENTRICLE PLAX 2D LVIDd:         3.90 cm   Diastology LVIDs:         2.20 cm   LV e' medial:    5.55 cm/s LV PW:         1.10 cm   LV E/e' medial:  16.6 LV IVS:        1.25 cm   LV e' lateral:   6.09 cm/s LVOT diam:     2.00 cm   LV E/e' lateral: 15.1 LV SV:         54 LV SV Index:   40 LVOT Area:     3.14 cm  RIGHT VENTRICLE RV S prime:     8.81 cm/s TAPSE (M-mode): 1.5 cm LEFT ATRIUM             Index        RIGHT ATRIUM           Index LA diam:        3.20 cm 2.35 cm/m   RA Area:     15.70 cm LA Vol (A2C):   42.7 ml 31.36 ml/m  RA Volume:   33.20 ml  24.38 ml/m LA Vol (A4C):   43.0  ml 31.58 ml/m LA Biplane Vol: 44.2 ml 32.46 ml/m  AORTIC VALVE AV Area (Vmax):    1.23 cm AV Area (Vmean):   1.30 cm AV Area (VTI):     1.40 cm AV Vmax:           231.00 cm/s AV Vmean:          160.000 cm/s AV VTI:             0.387 m AV Peak Grad:      21.3 mmHg AV Mean Grad:      11.0 mmHg LVOT Vmax:         90.80 cm/s LVOT Vmean:        66.100 cm/s LVOT VTI:          0.172 m LVOT/AV VTI ratio: 0.44  AORTA Ao Root diam: 3.40 cm Ao Asc diam:  2.70 cm MITRAL VALVE               TRICUSPID VALVE MV Area (PHT): 3.45 cm    TR Peak grad:   21.7 mmHg MV Decel Time: 220 msec    TR Vmax:        233.00 cm/s MV E velocity: 92.00 cm/s MV A velocity: 68.10 cm/s  SHUNTS MV E/A ratio:  1.35        Systemic VTI:  0.17 m                            Systemic Diam: 2.00 cm Dani Gobble Croitoru MD Electronically signed by Sanda Klein MD Signature Date/Time: 03/15/2022/10:48:38 AM    Final    MR ANGIO NECK WO CONTRAST  Result Date: 03/14/2022 CLINICAL DATA:  Stroke, follow-up. Acute infarct involving the posterior limb right internal capsule. EXAM: MRA NECK WITHOUT CONTRAST TECHNIQUE: Angiographic images of the neck were acquired using MRA technique without intravenous contrast. Carotid stenosis measurements (when applicable) are obtained utilizing NASCET criteria, using the distal internal carotid diameter as the denominator. COMPARISON:  CTA neck 11/16/2019 FINDINGS: The time-of-flight images demonstrate no significant flow disturbance at either carotid bifurcation. Flow is antegrade in the right vertebral artery. No antegrade flow is evident in the left vertebral artery. IMPRESSION: 1. No significant carotid artery disease. 2. No antegrade flow is evident in the left vertebral artery. Electronically Signed   By: San Morelle M.D.   On: 03/14/2022 17:17   MR ANGIO HEAD WO CONTRAST  Result Date: 03/14/2022 CLINICAL DATA:  Acute infarct scratched at stroke, follow-up. Acute infarct of the posterior limb right internal capsule. EXAM: MRA HEAD WITHOUT CONTRAST TECHNIQUE: Angiographic images of the Circle of Willis were acquired using MRA technique without intravenous contrast. COMPARISON:  MR head without contrast 03/14/2022. MRA of the head  11/15/2019 FINDINGS: Anterior circulation: Extensive atherosclerotic irregularity is present within the cavernous left internal carotid artery. Signal loss is present at the left ICA terminus scratched at signal loss at the left ICA terminus is artifactual at the slab overlap. The right A1 segment is dominant. Signal loss in the distal right M1 segment is artifactual. Mild irregularity is present throughout the ACA and MCA branch vessels without a significant proximal stenosis or occlusion. Posterior circulation: Distal vertebral arteries are incompletely visualized. Basilar artery is within normal limits. Signal loss in the posterior cerebral arteries is artifactual proximally. There is asymmetric attenuation of distal branch vessels on the right. Anatomic variants: None Other: None. IMPRESSION: 1. Significant motion artifact resulting in multiple areas of  signal loss. 2. Extensive atherosclerotic changes within the cavernous left internal carotid artery are similar the prior study. 3. Distal small vessel disease without definite proximal stenosis or occlusion. Electronically Signed   By: San Morelle M.D.   On: 03/14/2022 17:15   MR BRAIN WO CONTRAST  Result Date: 03/14/2022 CLINICAL DATA:  Neuro deficit, acute, stroke suspected. Confused and agitated. Left upper and lower extremity weakness. EXAM: MRI HEAD WITHOUT CONTRAST TECHNIQUE: Multiplanar, multiecho pulse sequences of the brain and surrounding structures were obtained without intravenous contrast. COMPARISON:  CT head without contrast/1/24. MR head and MRA head 11/15/2019 FINDINGS: Brain: An acute nonhemorrhagic infarct of the posterior limb right internal capsule measures up to 9 mm. No other acute infarcts are present. The remainder the study is severely degraded by patient motion. Remote infarct is present in the posterior right lentiform nucleus extending superiorly to the corona radiata, stable. Extensive periventricular and subcortical  white matter changes bilaterally are moderately advanced for age. Wallerian degeneration extends into the brainstem. Brainstem and cerebellum are otherwise within normal limits. The ventricles are proportionate to the degree of atrophy. No significant extraaxial fluid collection is present. Vascular: Flow is present in the major intracranial arteries. Skull and upper cervical spine: The craniocervical junction is normal. Upper cervical spine is within normal limits. Marrow signal is unremarkable. Sinuses/Orbits: The paranasal sinuses and mastoid air cells are clear. The globes and orbits are within normal limits. IMPRESSION: 1. Acute nonhemorrhagic infarct of the posterior limb right internal capsule measures up to 9 mm. 2. Remote infarct of the posterior right lentiform nucleus extending superiorly to the corona radiata, stable. 3. Extensive periventricular and subcortical white matter disease bilaterally is moderately advanced for age. This likely reflects the sequela of chronic microvascular ischemia. 4. The image was discontinued due to severe patient motion. MRA could not be completed at this time. The above was relayed via text pager to Dr. Curly Shores On 03/14/2022 at 13:51 . Electronically Signed   By: San Morelle M.D.   On: 03/14/2022 13:52    Scheduled Meds:  aspirin  325 mg Oral Daily   clopidogrel  75 mg Oral Daily   enoxaparin (LOVENOX) injection  30 mg Subcutaneous Q24H   Continuous Infusions:   LOS: 1 day   Bonnielee Haff,  [facility]  If 7PM-7AM, please contact night-coverage [email or web address]  03/16/2022, 10:45 AM

## 2022-03-16 NOTE — Progress Notes (Signed)
Patient expresses wanting code status changed to DNR/DNI. Says she has paperwork for DNR at her independent living, but did not bring it with her. MD notified and aware.

## 2022-03-16 NOTE — Progress Notes (Signed)
Re: Yvonne Ballard DOB:Mar 13, 1941 Date:03/16/2022   To Whom It May Concern:  Please be advised that the above-named patient will require a short-term nursing home stay--anticipated 30 days or less for rehabilitation and strengthening. The plan is for home.

## 2022-03-17 DIAGNOSIS — I6300 Cerebral infarction due to thrombosis of unspecified precerebral artery: Secondary | ICD-10-CM | POA: Diagnosis not present

## 2022-03-17 DIAGNOSIS — I10 Essential (primary) hypertension: Secondary | ICD-10-CM | POA: Diagnosis not present

## 2022-03-17 LAB — BASIC METABOLIC PANEL
Anion gap: 8 (ref 5–15)
BUN: 10 mg/dL (ref 8–23)
CO2: 24 mmol/L (ref 22–32)
Calcium: 8.5 mg/dL — ABNORMAL LOW (ref 8.9–10.3)
Chloride: 101 mmol/L (ref 98–111)
Creatinine, Ser: 0.6 mg/dL (ref 0.44–1.00)
GFR, Estimated: >60 mL/min (ref >60)
Glucose, Bld: 104 mg/dL — ABNORMAL HIGH (ref 70–99)
Potassium: 3.7 mmol/L (ref 3.5–5.1)
Sodium: 133 mmol/L — ABNORMAL LOW (ref 135–145)

## 2022-03-17 LAB — GLUCOSE, CAPILLARY: Glucose-Capillary: 105 mg/dL — ABNORMAL HIGH (ref 70–99)

## 2022-03-17 LAB — MAGNESIUM: Magnesium: 1.9 mg/dL (ref 1.7–2.4)

## 2022-03-17 MED ORDER — DILTIAZEM HCL 30 MG PO TABS
60.0000 mg | ORAL_TABLET | Freq: Two times a day (BID) | ORAL | Status: DC
  Administered 2022-03-17 – 2022-03-18 (×3): 60 mg via ORAL
  Filled 2022-03-17 (×3): qty 2

## 2022-03-17 MED ORDER — POTASSIUM CHLORIDE CRYS ER 20 MEQ PO TBCR
40.0000 meq | EXTENDED_RELEASE_TABLET | Freq: Once | ORAL | Status: AC
  Administered 2022-03-17: 40 meq via ORAL
  Filled 2022-03-17: qty 2

## 2022-03-17 NOTE — Progress Notes (Signed)
PROGRESS NOTE    Yvonne Ballard  OZD:[id number] DOB: 05/20/1940 DOA: 03/14/2022 PCP: Kelton Pillar, MD   Brief Narrative:  Yvonne Ballard is a 82 y.o. female with medical history significant of TIA, HTN, chronic HFrEF, aortic stenosis status post biosynthetic TAVR, ascending aortic aneurysm, nonobstructive CAD, presented with new onset of left-sided weakness.  Patient admitted as above with acute onset left-sided weakness, MRI consistent with acute CVA.  Patient currently lives in an independent living facility.   Assessment & Plan:   Acute CVA Seen by neurology.  Found to have a stroke in the right internal capsule.  Patient does have left-sided weakness. Neurology is recommending aspirin and Plavix for 3 weeks followed by Plavix alone. LDL 130.  HbA1c 5.4 Patient resistant to taking statin medications.  Atorvastatin was initially initiated by neurology but she has refused this.  Alternatives such as Zetia or PCSK9 inhibitor can be considered.  Will discuss Zetia with patient.  If she again is hesitant then this will need to be pursued in the outpatient setting. Patient seen by physical therapy.  Skilled nursing facility is recommended for rehab.  Social worker is following. Seen by speech therapy.  Dysphagia 2 diet is recommended.  Essential hypertension Permissive hypertension was allowed. Prior to admission patient was noted to be on Avapro and Cardizem. Cardizem has been resumed.  Tachycardia Patient noted to be tachycardic on telemetry.  Rhythm somewhat irregular.  EKG was done which shows sinus tachycardia with PACs.  Computer is read as atrial fibrillation.  Verified with cardiology.  This is not atrial fibrillation. Patient noted to be on Cardizem prior to admission.  Will resume.  Hypokalemia/hyponatremia Repleted.  Normocytic anemia No evidence of overt bleeding.  Continue to monitor.  History of thoracic aortic aneurysm Aortic stenosis status post biosynthetic  TAVR Stable.  Chronic heart failure with reduced ejection fraction -EF 55 to 60% during this hospitalization with left ventricle regional wall motion abnormalities and mild hypertrophy -Bioprosthetic aortic valve without stenosis or regurgitation  DVT prophylaxis: Lovenox Code Status: Full Family Communication: No family at bedside Disposition: SNF  Status is: Inpatient Remains inpatient appropriate because: Acute stroke   Consultants:  Neurology  Procedures:  None  Antimicrobials:  None indicated  Subjective: Left-sided weakness persists but better than the last few days.  No new complaints offered.  Objective: Vitals:   03/16/22 2354 03/16/22 2356 03/17/22 0311 03/17/22 0754  BP: (!) 152/113  (!) 151/90 (!) 162/99  Pulse:  67 88 70  Resp: 18  16 18   Temp: 98 F (36.7 C)  98.6 F (37 C) 98.2 F (36.8 C)  TempSrc: Oral     SpO2:  99% 97% 99%  Weight:      Height:       No intake or output data in the 24 hours ending 03/17/22 1034  Filed Weights   03/14/22 1027 03/15/22 1201 03/15/22 2052  Weight: 43.2 kg 43.2 kg 43.4 kg    Examination:  General appearance: Awake alert.  In no distress Resp: Clear to auscultation bilaterally.  Normal effort Cardio: S1-S2 is normal regular.  No S3-S4.  No rubs murmurs or bruit GI: Abdomen is soft.  Nontender nondistended.  Bowel sounds are present normal.  No masses organomegaly Extremities: No edema.  Physical deconditioning noted. Neurologic: Alert and oriented x3.  Left-sided weakness    Data Reviewed: I have personally reviewed following labs and imaging studies  CBC: Recent Labs  Lab 03/14/22 1023 03/14/22 1041 03/16/22 0555  WBC  5.2  --  5.9  NEUTROABS 2.5  --   --   HGB 10.9* 11.2* 11.3*  HCT 32.7* 33.0* 33.4*  MCV 91.1  --  89.8  PLT 169  --  574    Basic Metabolic Panel: Recent Labs  Lab 03/14/22 1023 03/14/22 1041 03/16/22 0555 03/17/22 0249  NA 137 139 137 133*  K 3.1* 3.1* 3.3* 3.7  CL  102 101 105 101  CO2 25  --  23 24  GLUCOSE 123* 121* 97 104*  BUN 11 11 11 10   CREATININE 0.69 0.60 0.63 0.60  CALCIUM 8.6*  --  8.4* 8.5*  MG  --   --   --  1.9    GFR: Estimated Creatinine Clearance: 36.5 mL/min (by C-G formula based on SCr of 0.6 mg/dL).  Liver Function Tests: Recent Labs  Lab 03/14/22 1023  AST 26  ALT 17  ALKPHOS 63  BILITOT 0.6  PROT 6.1*  ALBUMIN 3.7     Coagulation Profile: Recent Labs  Lab 03/14/22 1023  INR 1.1     HbA1C: Recent Labs    03/14/22 1430  HGBA1C 5.7*    CBG: Recent Labs  Lab 03/14/22 1022  GLUCAP 118*      Radiology Studies: No results found.  Scheduled Meds:  aspirin  325 mg Oral Daily   clopidogrel  75 mg Oral Daily   diltiazem  60 mg Oral Q12H   enoxaparin (LOVENOX) injection  30 mg Subcutaneous Q24H   Continuous Infusions:   LOS: 2 days   Bonnielee Haff,  [facility]  If 7PM-7AM, please contact night-coverage [email or web address]  03/17/2022, 10:34 AM

## 2022-03-17 NOTE — Plan of Care (Signed)
  Problem: Education: Goal: Knowledge of disease or condition will improve Outcome: Progressing Goal: Knowledge of secondary prevention will improve (MUST DOCUMENT ALL) Outcome: Progressing   Problem: Ischemic Stroke/TIA Tissue Perfusion: Goal: Complications of ischemic stroke/TIA will be minimized Outcome: Progressing   Problem: Coping: Goal: Will verbalize positive feelings about self Outcome: Progressing   Problem: Health Behavior/Discharge Planning: Goal: Ability to manage health-related needs will improve Outcome: Progressing   Problem: Self-Care: Goal: Ability to participate in self-care as condition permits will improve Outcome: Progressing   Problem: Nutrition: Goal: Risk of aspiration will decrease Outcome: Progressing Goal: Dietary intake will improve Outcome: Progressing   Problem: Education: Goal: Knowledge of General Education information will improve Description: Including pain rating scale, medication(s)/side effects and non-pharmacologic comfort measures Outcome: Progressing   Problem: Health Behavior/Discharge Planning: Goal: Ability to manage health-related needs will improve Outcome: Progressing   Problem: Clinical Measurements: Goal: Ability to maintain clinical measurements within normal limits will improve Outcome: Progressing Goal: Will remain free from infection Outcome: Progressing Goal: Diagnostic test results will improve Outcome: Progressing Goal: Cardiovascular complication will be avoided Outcome: Progressing   Problem: Activity: Goal: Risk for activity intolerance will decrease Outcome: Progressing   Problem: Nutrition: Goal: Adequate nutrition will be maintained Outcome: Progressing   Problem: Coping: Goal: Level of anxiety will decrease Outcome: Progressing   Problem: Elimination: Goal: Will not experience complications related to bowel motility Outcome: Progressing   Problem: Pain Managment: Goal: General experience of  comfort will improve Outcome: Progressing   Problem: Safety: Goal: Ability to remain free from injury will improve Outcome: Progressing   Problem: Skin Integrity: Goal: Risk for impaired skin integrity will decrease Outcome: Progressing   Problem: Education: Goal: Knowledge of patient specific risk factors will improve [place] N/A or DELETE if not current risk factor) Outcome: Completed/Met   Problem: Coping: Goal: Will identify appropriate support needs Outcome: Completed/Met   Problem: Health Behavior/Discharge Planning: Goal: Goals will be collaboratively established with patient/family Outcome: Completed/Met   Problem: Self-Care: Goal: Verbalization of feelings and concerns over difficulty with self-care will improve Outcome: Completed/Met   Problem: Elimination: Goal: Will not experience complications related to urinary retention Outcome: Completed/Met

## 2022-03-17 NOTE — Progress Notes (Signed)
Physical Therapy Treatment Patient Details Name: Monisha Siebel MRN: [id number] DOB: 14-Apr-1940 Today's Date: 03/17/2022   History of Present Illness Yvonne Ballard is a 82 y.o. female admitted 1/1 presenting with new onset of left-sided weakness. MRI  acute nonhemorrhagic infract of the posterior limb of the right internal capsule.  PMH: TIA, HTN, chronic HFrEF, aortic stenosis status post biosynthetic TAVR, ascending aortic aneurysm, nonobstructive CAD    PT Comments    Received pt R sidelying in bed and agreeable to PT treatment. Pt transferred to EOB with supervision with cues for attention to LUE. Pt stood without AD and heavy min A and ambulated 45f with L HHA and mod fading to min A. Of note, pt demonstrates improvements in posture, step length, and is able to ambulate further with less assistance when using RW vs HHA. Pt requested to change clothes and doffed dirty shirt and donned clean one sitting with mod A and required mod/max A to change into new pants - cues for hemibody dressing technique. Required mod A for standing balance with dressing without using AD. Pt left in recliner with all needs met. Acute PT to cont to follow.     Recommendations for follow up therapy are one component of a multi-disciplinary discharge planning process, led by the attending physician.  Recommendations may be updated based on patient status, additional functional criteria and insurance authorization.  Follow Up Recommendations  Skilled nursing-short term rehab (<3 hours/day) Can patient physically be transported by private vehicle: Yes   Assistance Recommended at Discharge Frequent or constant Supervision/Assistance  Patient can return home with the following A little help with bathing/dressing/bathroom;Assistance with cooking/housework;Assist for transportation;Help with stairs or ramp for entrance;A little help with walking and/or transfers;Direct supervision/assist for financial management;Direct  supervision/assist for medications management   Equipment Recommendations  Rolling walker (2 wheels)    Recommendations for Other Services       Precautions / Restrictions Precautions Precautions: Fall Precaution Comments: mild left hemiparesis Restrictions Weight Bearing Restrictions: No     Mobility  Bed Mobility Overal bed mobility: Needs Assistance Bed Mobility: Sidelying to Sit   Sidelying to sit: Supervision       General bed mobility comments: pt transferred R sidelying<>sitting EOB from flat bed without bedrails - cues for attention to LUE as her arm got stuck in her shirt Patient Response: Cooperative  Transfers Overall transfer level: Needs assistance Equipment used: None Transfers: Sit to/from Stand Sit to Stand: Min assist           General transfer comment: Stood from EOB without AD and heavy min A. Pt demonstrating moderate/significant kyphosis and flexed trunk    Ambulation/Gait Ambulation/Gait assistance: Mod assist, Min assist Gait Distance (Feet): 65 Feet Assistive device: 1 person hand held assist Gait Pattern/deviations: Step-to pattern, Decreased stride length, Narrow base of support, Decreased step length - right, Decreased step length - left, Trunk flexed Gait velocity: decreased Gait velocity interpretation: <1.31 ft/sec, indicative of household ambulator   General Gait Details: pt demosntrated significant trunk flexion, requiring cues for upright posture. pt unsteady with turns and demonstrated shuffling gait pattern. Limited by fatigue and weakness in LLE.   Stairs             Wheelchair Mobility    Modified Rankin (Stroke Patients Only)       Balance Overall balance assessment: Needs assistance Sitting-balance support: Feet supported, Single extremity supported Sitting balance-Leahy Scale: Fair Sitting balance - Comments: able to maintain static sitting balance with supervision  Standing balance support: Single  extremity supported Standing balance-Leahy Scale: Poor Standing balance comment: pt required min A for static standing balance and mod A fading to min A for dynamic standing balance. Heavy reliance on external support for balance                            Cognition Arousal/Alertness: Awake/alert Behavior During Therapy: Impulsive Overall Cognitive Status: Impaired/Different from baseline                       Memory: Decreased short-term memory Following Commands: Follows multi-step commands with increased time     Problem Solving: Requires verbal cues General Comments: pt with improved safety awareness, unstanding that she needs to use RW upon D/C for safety and improved independence        Exercises      General Comments        Pertinent Vitals/Pain Pain Assessment Pain Assessment: No/denies pain    Home Living                          Prior Function            PT Goals (current goals can now be found in the care plan section) Acute Rehab PT Goals Patient Stated Goal: to go home PT Goal Formulation: With patient/family Time For Goal Achievement: 03/29/22 Potential to Achieve Goals: Fair Progress towards PT goals: Progressing toward goals    Frequency    Min 4X/week      PT Plan Current plan remains appropriate    Co-evaluation              AM-PAC PT "6 Clicks" Mobility   Outcome Measure  Help needed turning from your back to your side while in a flat bed without using bedrails?: A Little Help needed moving from lying on your back to sitting on the side of a flat bed without using bedrails?: A Lot Help needed moving to and from a bed to a chair (including a wheelchair)?: A Little Help needed standing up from a chair using your arms (e.g., wheelchair or bedside chair)?: A Little Help needed to walk in hospital room?: A Lot Help needed climbing 3-5 steps with a railing? : A Lot 6 Click Score: 15    End of  Session Equipment Utilized During Treatment: Gait belt Activity Tolerance: Patient tolerated treatment well;Patient limited by fatigue Patient left: in chair;with call bell/phone within reach;with chair alarm set Nurse Communication: Mobility status PT Visit Diagnosis: Unsteadiness on feet (R26.81);Muscle weakness (generalized) (M62.81);Hemiplegia and hemiparesis Hemiplegia - Right/Left: Left Hemiplegia - dominant/non-dominant: Non-dominant     Time: 9485-4627 PT Time Calculation (min) (ACUTE ONLY): 15 min  Charges:  $Gait Training: 8-22 mins                     Becky Sax PT, DPT  Blenda Nicely 03/17/2022, 10:37 AM

## 2022-03-17 NOTE — Plan of Care (Signed)
  Problem: Education: Goal: Knowledge of disease or condition will improve Outcome: Progressing   Problem: Coping: Goal: Will verbalize positive feelings about self Outcome: Progressing   Problem: Health Behavior/Discharge Planning: Goal: Ability to manage health-related needs will improve Outcome: Progressing   Problem: Activity: Goal: Risk for activity intolerance will decrease Outcome: Progressing   Problem: Nutrition: Goal: Adequate nutrition will be maintained Outcome: Progressing   Problem: Elimination: Goal: Will not experience complications related to bowel motility Outcome: Progressing   Problem: Safety: Goal: Ability to remain free from injury will improve Outcome: Progressing   Problem: Skin Integrity: Goal: Risk for impaired skin integrity will decrease Outcome: Progressing

## 2022-03-17 NOTE — TOC Progression Note (Signed)
Transition of Care [facility]) - Progression Note    Patient Details  Name: Yvonne Ballard MRN: [id number] Date of Birth: 1940-08-25  [facility]) CM/SW Contact  Pollie Friar, RN Phone Number: 03/17/2022, 1:01 PM  Clinical Narrative:    Pt with some irregular heart rhythm. Adding back home meds. Pt has had 2 midnights as inpt under her medicare. She will d/c to [facility] tomorrow for SNF rehab. CM has updated the pts 2 daughters and pt.  TOC following.   Expected Discharge Plan: [place] Barriers to Discharge: Continued Medical Work up  Expected Discharge Plan and Services In-house Referral: Clinical Social Work Discharge Planning Services: CM Consult Post Acute Care Choice: [place] Living arrangements for the past 2 months: Apartment                                       Social Determinants of Health (SDOH) Interventions [place]: No Food Insecurity (03/15/2022)  Housing: Low Risk  (03/15/2022)  Transportation Needs: No Transportation Needs (03/15/2022)  Utilities: Not At Risk (03/15/2022)  Tobacco Use: Low Risk  (03/14/2022)    Readmission Risk Interventions     No data to display

## 2022-03-17 NOTE — Progress Notes (Addendum)
Mobility Specialist Progress Note   03/17/22 1140  Mobility  Activity Ambulated independently to bathroom;Ambulated with assistance in room  Level of Assistance Minimal assist, patient does 75% or more  Assistive Device Front wheel walker  Distance Ambulated (ft) 25 ft  Range of Motion/Exercises Active;All extremities  Activity Response Tolerated well   Patient received in recliner requesting assistance to restroom.  Stood impulsively and quickly navigated to restroom requiring cues and min A to navigate AD through room. Patient deferred assistance from writer stating she wanted to be as independent as possible. Tolerated without complaint or incident. Was left in recliner with all needs met, call bell in reach.   Yvonne Ballard, BS EXP Mobility Specialist Please contact via SecureChat or Rehab office at [phone]

## 2022-03-18 DIAGNOSIS — E785 Hyperlipidemia, unspecified: Secondary | ICD-10-CM

## 2022-03-18 DIAGNOSIS — G8194 Hemiplegia, unspecified affecting left nondominant side: Secondary | ICD-10-CM

## 2022-03-18 DIAGNOSIS — I10 Essential (primary) hypertension: Secondary | ICD-10-CM

## 2022-03-18 DIAGNOSIS — I6300 Cerebral infarction due to thrombosis of unspecified precerebral artery: Secondary | ICD-10-CM | POA: Diagnosis not present

## 2022-03-18 MED ORDER — ALPRAZOLAM 0.25 MG PO TABS: 0.1250 mg | ORAL_TABLET | Freq: Every day | ORAL | 0 refills | Status: DC | PRN

## 2022-03-18 MED ORDER — CLOPIDOGREL BISULFATE 75 MG PO TABS: 75.0000 mg | ORAL_TABLET | Freq: Every day | ORAL | Status: DC

## 2022-03-18 MED ORDER — SENNOSIDES-DOCUSATE SODIUM 8.6-50 MG PO TABS: 1.0000 | ORAL_TABLET | Freq: Every evening | ORAL | Status: DC | PRN

## 2022-03-18 MED ORDER — IRBESARTAN 150 MG PO TABS
75.0000 mg | ORAL_TABLET | Freq: Two times a day (BID) | ORAL | Status: DC
  Administered 2022-03-18: 75 mg via ORAL
  Filled 2022-03-18: qty 1

## 2022-03-18 MED ORDER — ASPIRIN 81 MG PO TBEC: 81.0000 mg | DELAYED_RELEASE_TABLET | Freq: Every day | ORAL | Status: AC

## 2022-03-18 NOTE — Care Management Important Message (Signed)
Important Message  Patient Details  Name: Yvonne Ballard MRN: [id number] Date of Birth: Nov 24, 1940   Medicare Important Message Given:  Yes Patient left prior to IM delivery will mail the patient home address.     Desarie Feild 03/18/2022, 1:57 PM

## 2022-03-18 NOTE — TOC Transition Note (Signed)
Transition of Care [facility]) - CM/SW Discharge Note   Patient Details  Name: Yvonne Ballard MRN: [id number] Date of Birth: 1940/10/30  [facility] Care [facility]) CM/SW Contact:  Pollie Friar, RN Phone Number: 03/18/2022, 10:20 AM   Clinical Narrative:    Pt is discharging to [facility] SNF today. She will transport via [place]. Bedside RN updated and d/c packet is at the desk.   Number for report: [phone]   Final next level of care: Skilled Nursing Facility Barriers to Discharge: No Barriers Identified   Patient Goals and CMS Choice CMS Medicare.gov Compare Post Acute Care list provided to:: Patient Choice offered to / list presented to : Patient, Adult Children  Discharge Placement PASRR number recieved: 03/17/22 PASRR number recieved: 03/17/22            Patient chooses bed at: [facility] Patient to be transferred to facility by: [facility] Name of family member notified: daughters Patient and family notified of of transfer: 03/18/22  Discharge Plan and Services Additional resources added to the After Visit Summary for   In-house Referral: Clinical Social Work Discharge Planning Services: CM Consult Post Acute Care Choice: [place]                               Social Determinants of Health (SDOH) Interventions SDOH Screenings   Food Insecurity: No Food Insecurity (03/15/2022)  Housing: Low Risk  (03/15/2022)  Transportation Needs: No Transportation Needs (03/15/2022)  Utilities: Not At Risk (03/15/2022)  Tobacco Use: Low Risk  (03/14/2022)     Readmission Risk Interventions     No data to display

## 2022-03-18 NOTE — Discharge Summary (Signed)
[facility]  Physician Discharge Summary   Patient ID: Yvonne Ballard MRN: [id number] DOB/AGE: 1940/07/10 82 y.o.  Admit date: 03/14/2022 Discharge date:   03/18/2022   PCP: Kelton Pillar, MD  DISCHARGE DIAGNOSES:    Acute CVA (cerebrovascular accident) ([place])   S/P AVR (aortic valve replacement)   Stroke (cerebrum) ([place])   Hypertension   Acute CVA (cerebrovascular accident) ([place])   RECOMMENDATIONS FOR OUTPATIENT FOLLOW UP: Referral has been sent to Dr. Doug Sou office with cardiology to arrange outpatient follow-up for the new wall motion abnormality noted on echocardiogram Ambulatory referral will be sent to neurology   Home Health: To SNF Equipment/Devices: None  CODE STATUS: DNR  DISCHARGE CONDITION: fair  Diet recommendation: Dysphagia 2 diet with thin liquids  INITIAL HISTORY: Yvonne Ballard is a 82 y.o. female with medical history significant of TIA, HTN, chronic HFrEF, aortic stenosis status post biosynthetic TAVR, ascending aortic aneurysm, nonobstructive CAD, presented with new onset of left-sided weakness.  Patient admitted as above with acute onset left-sided weakness, MRI consistent with acute CVA.  Patient currently lives in an independent living facility.  Consultations: Neurology  Procedures: Echocardiogram   HOSPITAL COURSE:   Acute CVA Seen by neurology.  Found to have a stroke in the right internal capsule.  Patient does have left-sided weakness. Neurology is recommending aspirin and Plavix for 3 weeks followed by Plavix alone. LDL 130.  HbA1c 5.4 Patient resistant to taking statin medications.  Atorvastatin was initially initiated by neurology but she has refused this.  Alternatives such as Zetia or PCSK9 inhibitor can be considered.  Zetia was also discussed with the patient but she declines.  She did not give a clear reason.  She wants to try changing her diet first. She was told not taking medications for high cholesterol can be  potentially harmful.  She understands and would still like to hold off on taking any medications.   Patient seen by physical therapy.  Skilled nursing facility is recommended for rehab.  Social worker is following. Seen by speech therapy.  Dysphagia 2 diet is recommended.   Essential hypertension Permissive hypertension was allowed. Prior to admission patient was noted to be on Avapro and Cardizem. These medications have been resumed.   Tachycardia Patient noted to be tachycardic on telemetry.  Rhythm somewhat irregular.  EKG was done which shows sinus tachycardia with PACs.  Computer is read as atrial fibrillation.  Verified with cardiology.  This is not atrial fibrillation. Heart rate improved on Cardizem.   Hypokalemia/hyponatremia Sodium level is stable.  Potassium has improved.  Magnesium 1.9.   Normocytic anemia No evidence of overt bleeding.  Continue to monitor.   History of thoracic aortic aneurysm Aortic stenosis status post biosynthetic TAVR Stable.   Chronic heart failure with reduced ejection fraction -EF 55 to 60% during this hospitalization with left ventricle regional wall motion abnormalities and mild hypertrophy -Bioprosthetic aortic valve without stenosis or regurgitation Patient is followed by Dr. Peter Martinique with cardiology.  Message sent to his office to arrange outpatient follow-up for the wall motion abnormalities noted on echocardiogram.  Patient did not have any cardiac symptoms during this hospital stay.  Patient is stable.  Okay for discharge to SNF today.   PERTINENT LABS:  The results of significant diagnostics from this hospitalization (including imaging, microbiology, ancillary and laboratory) are listed below for reference.    Labs:   Basic Metabolic Panel: Recent Labs  Lab 03/14/22 1023 03/14/22 1041 03/16/22 0555 03/17/22 0249  NA 137 139  137 133*  K 3.1* 3.1* 3.3* 3.7  CL 102 101 105 101  CO2 25  --  23 24  GLUCOSE 123* 121* 97  104*  BUN 11 11 11 10   CREATININE 0.69 0.60 0.63 0.60  CALCIUM 8.6*  --  8.4* 8.5*  MG  --   --   --  1.9   Liver Function Tests: Recent Labs  Lab 03/14/22 1023  AST 26  ALT 17  ALKPHOS 63  BILITOT 0.6  PROT 6.1*  ALBUMIN 3.7    CBC: Recent Labs  Lab 03/14/22 1023 03/14/22 1041 03/16/22 0555  WBC 5.2  --  5.9  NEUTROABS 2.5  --   --   HGB 10.9* 11.2* 11.3*  HCT 32.7* 33.0* 33.4*  MCV 91.1  --  89.8  PLT 169  --  171     CBG: Recent Labs  Lab 03/14/22 1022 03/17/22 1155  GLUCAP 118* 105*     IMAGING STUDIES ECHOCARDIOGRAM COMPLETE  Result Date: 03/15/2022    ECHOCARDIOGRAM REPORT   Patient Name:   Yvonne Ballard Date of Exam: 03/15/2022 Medical Rec #:  [id number]      Height:       60.0 in Accession #:    [id number]     Weight:       95.2 lb Date of Birth:  03-31-1940     BSA:          1.362 m Patient Age:    6 years       BP:           153/84 mmHg Patient Gender: F              HR:           71 bpm. Exam Location:  Inpatient Procedure: 2D Echo, Color Doppler and Cardiac Doppler Indications:    Stroke i63.9  History:        Patient has prior history of Echocardiogram examinations, most                 recent 11/16/2019. CAD; Risk Factors:Hypertension and                 Dyslipidemia. 03/31/14 [place] Procedure with 47mm Magna Ease                 Bioprosthetic Aortic Valve.                 Aortic Valve: valve is present in the aortic position.  Sonographer:    Raquel Sarna Senior RDCS Referring Phys: Lequita Halt  Sonographer Comments: Technically difficult due to thin body habitus. IMPRESSIONS  1. Left ventricular ejection fraction, by estimation, is 55 to 60%. The left ventricle has normal function. The left ventricle demonstrates regional wall motion abnormalities (see scoring diagram/findings for description). There is mild concentric left ventricular hypertrophy. Left ventricular diastolic parameters are consistent with Grade II diastolic dysfunction (pseudonormalization).  Elevated left atrial pressure.  2. Right ventricular systolic function is mildly reduced. The right ventricular size is normal. There is normal pulmonary artery systolic pressure. The estimated right ventricular systolic pressure is 94.7 mmHg.  3. Left atrial size was mildly dilated.  4. The mitral valve is myxomatous. Mild mitral valve regurgitation.  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  6. The aortic valve has been repaired/replaced. Aortic valve regurgitation is not visualized. No aortic stenosis is present. There is a valve present in the aortic position.  Echo findings are consistent with normal structure and function of the aortic valve prosthesis. Aortic valve mean gradient measures 11.0 mmHg. Aortic valve Vmax measures 2.31 m/s. Aortic valve acceleration time measures 49 msec.  7. Aortic root/ascending aorta has been repaired/replaced and dilatation noted. 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 Comparison(s): No significant change from prior study. Prior images reviewed side by side. FINDINGS  Left Ventricle: Left ventricular ejection fraction, by estimation, is 55 to 60%. The left ventricle has normal function. The left ventricle demonstrates regional wall motion abnormalities. The left ventricular internal cavity size was normal in size. There is mild concentric left ventricular hypertrophy. Abnormal (paradoxical) septal motion consistent with post-operative status. Left ventricular diastolic parameters are consistent with Grade II diastolic dysfunction (pseudonormalization). Elevated left atrial pressure. Right Ventricle: The right ventricular size is normal. No increase in right ventricular wall thickness. Right ventricular systolic function is mildly reduced. There is normal pulmonary artery systolic pressure. The tricuspid regurgitant velocity is 2.33 m/s, and with an assumed right atrial pressure of 3 mmHg, the  estimated right ventricular systolic pressure is 67.6 mmHg. Left Atrium: Left atrial size was mildly dilated. Right Atrium: Right atrial size was normal in size. Prominent Chiari network. Pericardium: There is no evidence of pericardial effusion. Mitral Valve: The mitral valve is myxomatous. Mild mitral valve regurgitation. Tricuspid Valve: 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 Aortic Valve: The aortic valve has been repaired/replaced. Aortic valve regurgitation is not visualized. No aortic stenosis is present. Aortic valve mean gradient measures 11.0 mmHg. Aortic valve peak gradient measures 21.3 mmHg. Aortic valve area, by VTI measures 1.40 cm. There is a valve present in the aortic position. Echo findings are consistent with normal structure and function of the aortic valve prosthesis. Pulmonic Valve: The pulmonic valve was grossly normal. Pulmonic valve regurgitation is not visualized. Aorta: The aortic root and ascending aorta are structurally normal, with no evidence of dilitation, the aortic root/ascending aorta has been repaired/replaced and aortic dilatation noted. 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 IAS/Shunts: No atrial level shunt detected by color flow Doppler.  LEFT VENTRICLE PLAX 2D LVIDd:         3.90 cm   Diastology LVIDs:         2.20 cm   LV e' medial:    5.55 cm/s LV PW:         1.10 cm   LV E/e' medial:  16.6 LV IVS:        1.25 cm   LV e' lateral:   6.09 cm/s LVOT diam:     2.00 cm   LV E/e' lateral: 15.1 LV SV:         54 LV SV Index:   40 LVOT Area:     3.14 cm  RIGHT VENTRICLE RV S prime:     8.81 cm/s TAPSE (M-mode): 1.5 cm LEFT ATRIUM             Index        RIGHT ATRIUM           Index LA diam:        3.20 cm 2.35 cm/m   RA Area:     15.70 cm LA Vol (A2C):   42.7 ml 31.36 ml/m  RA Volume:   33.20 ml  24.38 ml/m LA Vol (A4C):   43.0 ml  31.58  ml/m LA Biplane Vol: 44.2 ml 32.46 ml/m  AORTIC VALVE AV Area (Vmax):    1.23 cm AV Area (Vmean):   1.30 cm AV Area (VTI):     1.40 cm AV Vmax:           231.00 cm/s AV Vmean:          160.000 cm/s AV VTI:            0.387 m AV Peak Grad:      21.3 mmHg AV Mean Grad:      11.0 mmHg LVOT Vmax:         90.80 cm/s LVOT Vmean:        66.100 cm/s LVOT VTI:          0.172 m LVOT/AV VTI ratio: 0.44  AORTA Ao Root diam: 3.40 cm Ao Asc diam:  2.70 cm MITRAL VALVE               TRICUSPID VALVE MV Area (PHT): 3.45 cm    TR Peak grad:   21.7 mmHg MV Decel Time: 220 msec    TR Vmax:        233.00 cm/s MV E velocity: 92.00 cm/s MV A velocity: 68.10 cm/s  SHUNTS MV E/A ratio:  1.35        Systemic VTI:  0.17 m                            Systemic Diam: 2.00 cm Dani Gobble Croitoru MD Electronically signed by Sanda Klein MD Signature Date/Time: 03/15/2022/10:48:38 AM    Final    MR ANGIO NECK WO CONTRAST  Result Date: 03/14/2022 CLINICAL DATA:  Stroke, follow-up. Acute infarct involving the posterior limb right internal capsule. EXAM: MRA NECK WITHOUT CONTRAST TECHNIQUE: Angiographic images of the neck were acquired using MRA technique without intravenous contrast. Carotid stenosis measurements (when applicable) are obtained utilizing NASCET criteria, using the distal internal carotid diameter as the denominator. COMPARISON:  CTA neck 11/16/2019 FINDINGS: The time-of-flight images demonstrate no significant flow disturbance at either carotid bifurcation. Flow is antegrade in the right vertebral artery. No antegrade flow is evident in the left vertebral artery. IMPRESSION: 1. No significant carotid artery disease. 2. No antegrade flow is evident in the left vertebral artery. Electronically Signed   By: San Morelle M.D.   On: 03/14/2022 17:17   MR ANGIO HEAD WO CONTRAST  Result Date: 03/14/2022 CLINICAL DATA:  Acute infarct scratched at stroke, follow-up. Acute infarct of the posterior limb right internal capsule.  EXAM: MRA HEAD WITHOUT CONTRAST TECHNIQUE: Angiographic images of the Circle of Willis were acquired using MRA technique without intravenous contrast. COMPARISON:  MR head without contrast 03/14/2022. MRA of the head 11/15/2019 FINDINGS: Anterior circulation: Extensive atherosclerotic irregularity is present within the cavernous left internal carotid artery. Signal loss is present at the left ICA terminus scratched at signal loss at the left ICA terminus is artifactual at the slab overlap. The right A1 segment is dominant. Signal loss in the distal right M1 segment is artifactual. Mild irregularity is present throughout the ACA and MCA branch vessels without a significant proximal stenosis or occlusion. Posterior circulation: Distal vertebral arteries are incompletely visualized. Basilar artery is within normal limits. Signal loss in the posterior cerebral arteries is artifactual proximally. There is asymmetric attenuation of distal branch vessels on the right. Anatomic variants: None Other: None. IMPRESSION: 1. Significant motion artifact resulting in multiple areas of signal  loss. 2. Extensive atherosclerotic changes within the cavernous left internal carotid artery are similar the prior study. 3. Distal small vessel disease without definite proximal stenosis or occlusion. Electronically Signed   By: San Morelle M.D.   On: 03/14/2022 17:15   MR BRAIN WO CONTRAST  Result Date: 03/14/2022 CLINICAL DATA:  Neuro deficit, acute, stroke suspected. Confused and agitated. Left upper and lower extremity weakness. EXAM: MRI HEAD WITHOUT CONTRAST TECHNIQUE: Multiplanar, multiecho pulse sequences of the brain and surrounding structures were obtained without intravenous contrast. COMPARISON:  CT head without contrast/1/24. MR head and MRA head 11/15/2019 FINDINGS: Brain: An acute nonhemorrhagic infarct of the posterior limb right internal capsule measures up to 9 mm. No other acute infarcts are present. The  remainder the study is severely degraded by patient motion. Remote infarct is present in the posterior right lentiform nucleus extending superiorly to the corona radiata, stable. Extensive periventricular and subcortical white matter changes bilaterally are moderately advanced for age. Wallerian degeneration extends into the brainstem. Brainstem and cerebellum are otherwise within normal limits. The ventricles are proportionate to the degree of atrophy. No significant extraaxial fluid collection is present. Vascular: Flow is present in the major intracranial arteries. Skull and upper cervical spine: The craniocervical junction is normal. Upper cervical spine is within normal limits. Marrow signal is unremarkable. Sinuses/Orbits: The paranasal sinuses and mastoid air cells are clear. The globes and orbits are within normal limits. IMPRESSION: 1. Acute nonhemorrhagic infarct of the posterior limb right internal capsule measures up to 9 mm. 2. Remote infarct of the posterior right lentiform nucleus extending superiorly to the corona radiata, stable. 3. Extensive periventricular and subcortical white matter disease bilaterally is moderately advanced for age. This likely reflects the sequela of chronic microvascular ischemia. 4. The image was discontinued due to severe patient motion. MRA could not be completed at this time. The above was relayed via text pager to Dr. Curly Shores On 03/14/2022 at 13:51 . Electronically Signed   By: San Morelle M.D.   On: 03/14/2022 13:52   CT HEAD CODE STROKE WO CONTRAST  Result Date: 03/14/2022 CLINICAL DATA:  Code stroke.  82 year old female right side deficit. EXAM: CT HEAD WITHOUT CONTRAST TECHNIQUE: Contiguous axial images were obtained from the base of the skull through the vertex without intravenous contrast. RADIATION DOSE REDUCTION: This exam was performed according to the departmental dose-optimization program which includes automated exposure control, adjustment of the mA  and/or kV according to patient size and/or use of iterative reconstruction technique. COMPARISON:  Brain MRI 11/15/2019. Head CT 04/14/2018. CTA neck 11/16/2019. FINDINGS: Brain: Cerebral volume remains normal for age. No midline shift, mass effect, or evidence of intracranial mass lesion. No acute intracranial hemorrhage identified. No ventriculomegaly. Patchy and confluent bilateral cerebral white matter hypodensity, with chronic asymmetric involvement of the right corona radiata and lentiform, appears stable. No cortically based acute infarct identified. Vascular: Calcified atherosclerosis at the skull base. No suspicious intracranial vascular hyperdensity. Skull: No acute osseous abnormality identified. Sinuses/Orbits: Visualized paranasal sinuses and mastoids are stable and well aerated. Other: No acute orbit or scalp soft tissue finding. ASPECTS [facility] Stroke Program Early CT Score) Total score (0-10 with 10 being normal): 10 IMPRESSION: 1. No acute cortically based infarct or acute intracranial hemorrhage identified. ASPECTS 10. 2. Stable since 2021 non contrast CT appearance of chronic small vessel disease. 3. These results were communicated to Dr. Curly Shores at 10:37 am on 03/14/2022 by text page via the [facility] messaging system. Electronically Signed   By: Lemmie Evens  [place] Crane M.D.   On: 03/14/2022 10:38    DISCHARGE EXAMINATION: Vitals:   03/17/22 2056 03/18/22 0031 03/18/22 0500 03/18/22 0816  BP: (!) 184/79 102/62 (!) 180/119 (!) 168/94  Pulse: 63 61 73 74  Resp: 20 16 18 18   Temp: 97.9 F (36.6 C) 98.6 F (37 C) 97.8 F (36.6 C) (!) 97.5 F (36.4 C)  TempSrc: Oral Oral Oral Oral  SpO2: 99% 99% 99% 97%  Weight:      Height:       General appearance: Awake alert.  In no distress Resp: Clear to auscultation bilaterally.  Normal effort Cardio: S1-S2 is normal regular.  No S3-S4.  No rubs murmurs or bruit GI: Abdomen is soft.  Nontender nondistended.  Bowel sounds are present normal.  No masses  organomegaly   DISPOSITION: SNF  Discharge Instructions     Ambulatory referral to Neurology   Complete by: As directed    An appointment is requested in approximately: 8 weeks   Call MD for:  difficulty breathing, headache or visual disturbances   Complete by: As directed    Call MD for:  extreme fatigue   Complete by: As directed    Call MD for:  persistant dizziness or light-headedness   Complete by: As directed    Call MD for:  persistant nausea and vomiting   Complete by: As directed    Call MD for:  severe uncontrolled pain   Complete by: As directed    Call MD for:  temperature >100.4   Complete by: As directed    Discharge instructions   Complete by: As directed    Please review instructions on the discharge summary.  You were cared for by a hospitalist during your hospital stay. If you have any questions about your discharge medications or the care you received while you were in the hospital after you are discharged, you can call the unit and asked to speak with the hospitalist on call if the hospitalist that took care of you is not available. Once you are discharged, your primary care physician will handle any further medical issues. Please note that NO REFILLS for any discharge medications will be authorized once you are discharged, as it is imperative that you return to your primary care physician (or establish a relationship with a primary care physician if you do not have one) for your aftercare needs so that they can reassess your need for medications and monitor your lab values. If you do not have a primary care physician, you can call [phone] for a physician referral.   Increase activity slowly   Complete by: As directed          Allergies as of 03/18/2022   No Known Allergies      Medication List     STOP taking these medications    diltiazem 60 MG tablet Commonly known as: CARDIZEM       TAKE these medications    ALPRAZolam 0.25 MG  tablet Commonly known as: XANAX Take 0.5 tablets (0.125 mg total) by mouth daily as needed for anxiety.   aspirin EC 81 MG tablet Take 1 tablet (81 mg total) by mouth daily for 21 days. Swallow whole. For 3 weeks only What changed: additional instructions   clopidogrel 75 MG tablet Commonly known as: PLAVIX Take 1 tablet (75 mg total) by mouth daily.   diltiazem 120 MG 24 hr capsule Commonly known as: CARDIZEM CD Take 1 capsule (120 mg total) by mouth in  the morning and at bedtime.   dorzolamide-timolol 2-0.5 % ophthalmic solution Commonly known as: COSOPT Place 1 drop into the right eye 2 (two) times daily.   irbesartan 150 MG tablet Commonly known as: AVAPRO TAKE 1/2 TABLET TWICE A DAY. What changed:  how much to take how to take this when to take this additional instructions   senna-docusate 8.6-50 MG tablet Commonly known as: Senokot-S Take 1 tablet by mouth at bedtime as needed for mild constipation.          Follow-up Information     Martinique, Peter M, MD Follow up.   Specialty: Cardiology Why: Office will call to schedule appointment. Contact information: [address] [place] [address] [phone]         Garvin Fila, MD Follow up.   Specialties: Neurology, Radiology Contact information: [address] [place] [address] [phone]                 TOTAL DISCHARGE TIME: 35 minutes  [place]  [facility] Pager on [email or web address]  03/18/2022, 9:35 AM

## 2022-03-30 DIAGNOSIS — Z9181 History of falling: Secondary | ICD-10-CM | POA: Diagnosis not present

## 2022-03-30 DIAGNOSIS — M6281 Muscle weakness (generalized): Secondary | ICD-10-CM | POA: Diagnosis not present

## 2022-03-30 DIAGNOSIS — R2689 Other abnormalities of gait and mobility: Secondary | ICD-10-CM | POA: Diagnosis not present

## 2022-04-06 ENCOUNTER — Ambulatory Visit: Payer: 59 | Admitting: General Practice

## 2022-04-11 ENCOUNTER — Telehealth: Payer: Self-pay | Admitting: Cardiology

## 2022-04-11 NOTE — Telephone Encounter (Signed)
*  STAT* If patient is at the pharmacy, call can be transferred to refill team.   1. Which medications need to be refilled? (please list name of each medication and dose if known)  diltiazem (CARDIZEM CD) 120 MG 24 hr capsule  2. Which pharmacy/location (including street and city if local pharmacy) is medication to be sent to? [facility] [place], [place]  3. Do they need a 30 day or 90 day supply?  90 day supply  Patient states the pharmacy does not have any refills remaining

## 2022-04-13 MED ORDER — DILTIAZEM HCL ER COATED BEADS 120 MG PO CP24: 120.0000 mg | ORAL_CAPSULE | Freq: Two times a day (BID) | ORAL | 2 refills | Status: DC

## 2022-05-02 ENCOUNTER — Inpatient Hospital Stay: Payer: Medicare Other | Admitting: Adult Health

## 2022-05-03 NOTE — Progress Notes (Unsigned)
Cardiology Clinic Note   Patient Name: Yvonne Ballard Date of Encounter: 05/04/2022  Primary Care Provider:  [name], MD Primary Cardiologist:  Peter Martinique, MD  Patient Profile    Yvonne Ballard 82 year old female presents to the clinic today for follow-up evaluation of her AVR and hypertension.  Past Medical History    Past Medical History:  Diagnosis Date   Abnormal ECG    Anxiety    pt. reports that she is nervous person   Anxiety disorder    Ascending aortic aneurysm [facility])    s/p repair with Bental procedure   Coronary artery disease 03/2014   mild non obstructive CAD   Dyslipidemia, goal LDL below 70 07/26/2014   Family history of adverse reaction to anesthesia    Heart murmur    History of hiatal hernia    Hypertension    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HCC)    S/P AVR (aortic valve replacement)    tissue valve   Weight loss    Past Surgical History:  Procedure Laterality Date   BENTALL PROCEDURE N/A 03/31/2014   Procedure: BIO-BENTALL PROCEDURE using a 47mm Magna Ease Aortic Tissue Valve, a 26 mm Terumo Gelweave Valsalva Graft, a 14x10x57mm Terumo Gelweave Graft, and a 53mm straight Hemashield Platinum Graft.;  Surgeon: Ivin Poot, MD;  Location: [place];  Service: Open Heart Surgery;  Laterality: N/A;   LEFT AND RIGHT HEART CATHETERIZATION WITH CORONARY ANGIOGRAM N/A 03/26/2014   Procedure: LEFT AND RIGHT HEART CATHETERIZATION WITH CORONARY ANGIOGRAM;  Surgeon: Peter M Martinique, MD;  Location: [facility] CATH LAB;  Service: Cardiovascular;  Laterality: N/A;   TEE WITHOUT CARDIOVERSION N/A 03/31/2014   Procedure: TRANSESOPHAGEAL ECHOCARDIOGRAM (TEE);  Surgeon: Ivin Poot, MD;  Location: [place];  Service: Open Heart Surgery;  Laterality: N/A;   TONSILLECTOMY     TUBAL LIGATION      Allergies  No Known Allergies  History of Present Illness    Yvonne Ballard has a PMH of chronic HFrEF, aortic stenosis status post TAVR, ascending aortic aneurysm,  nonobstructive CAD, TIA, HTN.  She was initially seen by cardiology 1/16 and noted to have large thoracic aneurysm.  She underwent cardiac catheterization which showed mild nonobstructive CAD and mild LV dysfunction with an EF of 45%.  She underwent a stenting and arch TAA repair with Bentall procedure and tissue AVR and aorto-brachiocephalic bypass and aorto-left common carotid bypass on 03/31/2014.  She was admitted 11/18 with left facial droop and dysarthria.  Head CT was negative.  MRI showed acute infarct.  She was noted to have evidence of moderate chronic microvascular changes.  She was started on a atorvastatin and ASA.  She was admitted 2/20 with some dysarthria.  MRI and CT of her head showed no acute changes with chronic microvascular disease.  9/22 she was admitted with right central renal artery occlusion.  MRI showed small vessel disease without CVA.  Echocardiogram continue to be stable.  She was considered for TED as outpatient but did not follow-up with cardiology.  She was seen by Dr. Martinique on 11/04/2021 she was doing well.  She reported some increased nervousness.  Her blood pressure was okay at home.  She denied chest pain and dyspnea.  She reported that she did not like to take aspirin regularly.  Follow-up in 1 year was planned.  She was admitted to the hospital on 03/15/2022 and discharged on 03/18/2022.  She presented with new onset left-sided weakness.  Her MRI showed acute CVA.  Neurology recommended aspirin and Plavix for 3 weeks followed by Plavix alone.  She is resistant to statin medications.  She is felt to be a good candidate for PCSK9 inhibitor.  She was seen by physical therapy and skilled nursing facility for rehab was recommended.  She was also seen by speech therapy who recommended dysphagia 2 diet.  Permissive hypertension was allowed during hospitalization.  Cardizem and Avapro were restarted at discharge.  Her EF during her hospitalization was noted to be 50-60%.  She  was noted to have left ventricular regional wall motion abnormalities and mild hypertrophy.  Bioprosthetic valve was functioning normally.  No cardiac symptoms were noted during hospitalization.  She presents to the clinic today for follow-up evaluation and states she stopped taking her Plavix and aspirin when she was discharged from rehab.  She reports that she has only been taking irbesartan 75 mg daily.  She has been doing physical therapy, Occupational Therapy, and speech therapy 3 times per week.  She is not interested in medication for her hyperlipidemia.  She presents with her daughter today.  Her blood pressure is elevated at 160/80. On recheck her bp was noted to be 142/78.  She agreed to take irbesartan 75 mg twice daily.  We reviewed the importance of high-fiber and low-sodium diet.  She will present to [facility] for repeat lab work in 2 weeks.  I have asked her to continue to maintain her blood pressure log and we will plan follow-up in 4 to 6 months.  We will plan repeat lipid panel in 3 months.  Today she denies chest pain, shortness of breath, lower extremity edema, fatigue, palpitations, melena, hematuria, hemoptysis, diaphoresis, weakness, presyncope, syncope, orthopnea, and PND.    Home Medications    Prior to Admission medications   Medication Sig Start Date End Date Taking? Authorizing Provider  ALPRAZolam Duanne Moron) 0.25 MG tablet Take 0.5 tablets (0.125 mg total) by mouth daily as needed for anxiety. 03/18/22   Bonnielee Haff, MD  clopidogrel (PLAVIX) 75 MG tablet Take 1 tablet (75 mg total) by mouth daily. 03/18/22   Bonnielee Haff, MD  diltiazem (CARDIZEM CD) 120 MG 24 hr capsule Take 1 capsule (120 mg total) by mouth in the morning and at bedtime. 04/13/22   Martinique, Peter M, MD  dorzolamide-timolol (COSOPT) 2-0.5 % ophthalmic solution Place 1 drop into the right eye 2 (two) times daily. 02/15/22   [provider]  irbesartan (AVAPRO) 150 MG tablet TAKE 1/2 TABLET TWICE  A DAY. Patient taking differently: Take 75 mg by mouth 2 (two) times daily. 11/26/21   Martinique, Peter M, MD  senna-docusate (SENOKOT-S) 8.6-50 MG tablet Take 1 tablet by mouth at bedtime as needed for mild constipation. 03/18/22   Bonnielee Haff, MD    Family History    Family History  Problem Relation Age of Onset   Cancer Mother    Cancer Father    Stroke Father    Cancer Brother    She indicated that her mother is deceased. She indicated that her father is deceased. She indicated that her brother is deceased. She indicated that her maternal grandmother is deceased. She indicated that her maternal grandfather is deceased. She indicated that her paternal grandmother is deceased. She indicated that her paternal grandfather is deceased.  Social History    Social History   Socioeconomic History   Marital status: Widowed    Spouse name: Not on file   Number of children: 2   Years of education:  Not on file   Highest education level: Not on file  Occupational History   Not on file  Tobacco Use   Smoking status: Never   Smokeless tobacco: Never  Vaping Use   Vaping Use: Never used  Substance and Sexual Activity   Alcohol use: Yes    Comment: " I like wine", - irregular consumption - socially has wine     Drug use: No   Sexual activity: Not on file  Other Topics Concern   Not on file  Social History Narrative   Not on file   Social Determinants of Health   Financial Resource Strain: Not on file  Food Insecurity: No Food Insecurity (03/15/2022)   Hunger Vital Sign    Worried About Running Out of Food in the Last Year: Never true    Ran Out of Food in the Last Year: Never true  Transportation Needs: No Transportation Needs (03/15/2022)   PRAPARE - Hydrologist (Medical): No    Lack of Transportation (Non-Medical): No  Physical Activity: Not on file  Stress: Not on file  Social Connections: Not on file  Intimate Partner Violence: Not At Risk  (03/15/2022)   Humiliation, Afraid, Rape, and Kick questionnaire    Fear of Current or Ex-Partner: No    Emotionally Abused: No    Physically Abused: No    Sexually Abused: No     Review of Systems    General:  No chills, fever, night sweats or weight changes.  Cardiovascular:  No chest pain, dyspnea on exertion, edema, orthopnea, palpitations, paroxysmal nocturnal dyspnea. Dermatological: No rash, lesions/masses Respiratory: No cough, dyspnea Urologic: No hematuria, dysuria Abdominal:   No nausea, vomiting, diarrhea, bright red blood per rectum, melena, or hematemesis Neurologic:  No visual changes, wkns, changes in mental status. All other systems reviewed and are otherwise negative except as noted above.  Physical Exam    VS:  BP (!) 142/78   Pulse 68   Ht 5' (1.524 m)   Wt 97 lb 6.4 oz (44.2 kg)   SpO2 98%   BMI 19.02 kg/m  , BMI Body mass index is 19.02 kg/m. GEN: Well nourished, well developed, in no acute distress. HEENT: normal. Neck: Supple, no JVD, carotid bruits, or masses. Cardiac: RRR, no murmurs, rubs, or gallops. No clubbing, cyanosis, edema.  Radials/DP/PT 2+ and equal bilaterally.  Respiratory:  Respirations regular and unlabored, clear to auscultation bilaterally. GI: Soft, nontender, nondistended, BS + x 4. MS: no deformity or atrophy. Skin: warm and dry, no rash. Neuro:  Strength and sensation are intact. Psych: Normal affect.  Accessory Clinical Findings    Recent Labs: 03/14/2022: ALT 17 03/16/2022: Hemoglobin 11.3; Platelets 171 03/17/2022: BUN 10; Creatinine, Ser 0.60; Magnesium 1.9; Potassium 3.7; Sodium 133   Recent Lipid Panel    Component Value Date/Time   CHOL 216 (H) 03/14/2022 1023   CHOL 215 (H) 11/04/2019 1545   TRIG 61 03/14/2022 1023   HDL 77 03/14/2022 1023   HDL 72 11/04/2019 1545   CHOLHDL 2.8 03/14/2022 1023   VLDL 12 03/14/2022 1023   LDLCALC 127 (H) 03/14/2022 1023   LDLCALC 123 (H) 11/04/2019 1545   LDLDIRECT 124.5 (H)  11/16/2019 0545    HYPERTENSION CONTROL Vitals:   05/04/22 1533 05/04/22 1616  BP: (!) 160/80 (!) 142/78    The patient's blood pressure is elevated above target today.  In order to address the patient's elevated BP: Blood pressure will be monitored at  home to determine if medication changes need to be made.; A current anti-hypertensive medication was adjusted today.       ECG personally reviewed by me today-none today.  Echocardiogram 03/15/2022  IMPRESSIONS     1. Left ventricular ejection fraction, by estimation, is 55 to 60%. The  left ventricle has normal function. The left ventricle demonstrates  regional wall motion abnormalities (see scoring diagram/findings for  description). There is mild concentric left  ventricular hypertrophy. Left ventricular diastolic parameters are  consistent with Grade II diastolic dysfunction (pseudonormalization).  Elevated left atrial pressure.   2. Right ventricular systolic function is mildly reduced. The right  ventricular size is normal. There is normal pulmonary artery systolic  pressure. The estimated right ventricular systolic pressure is 70.0 mmHg.   3. Left atrial size was mildly dilated.   4. The mitral valve is myxomatous. Mild mitral valve regurgitation.   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   6. The aortic valve has been repaired/replaced. Aortic valve  regurgitation is not visualized. No aortic stenosis is present. There is a  valve present in the aortic position. Echo findings are consistent with  normal structure and function of the aortic  valve prosthesis. Aortic valve mean gradient measures 11.0 mmHg. Aortic  valve Vmax measures 2.31 m/s. Aortic valve acceleration time measures 49  msec.   7. Aortic root/ascending aorta has been repaired/replaced and dilatation  noted. 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   Comparison(s): No  significant change from prior study. Prior images  reviewed side by side.    Assessment & Plan   1.  Coronary artery disease-noted to be nonobstructive per cath.  Denies episodes of chest discomfort.  Working with physical therapy and denies chest discomfort. Continue aspirin and Plavix Heart healthy low-sodium high-fiber diet  Essential hypertension-BP today 160/80 and on recheck 142/78.  She reports that she is only taking Avapro 75 mg once daily.  She agrees to take Avapro 75 mg twice daily. Continue Avapro, Cardizem Heart healthy low-sodium diet-salty 6 given Maintain blood pressure log Repeat BMP in 2 weeks.  Hyperlipidemia-statin intolerant.  Reviewed options of ezetimibe and PCSK9 inhibitor Would like to defer ezetimibe and PCSK9 inhibitor.  Wishes to continue to work on diet and exercise.  CVA-admitted to the hospital 1-24 until 03/18/2022 with acute CVA noted on MRI.  She was placed on aspirin and Plavix.  Recommendation for cholesterol management was made she wishes to defer at this time.  We reviewed her increased risk.  She discontinued aspirin and Plavix after being discharged from rehab.  Does not wish to continue therapy. Follow-up with neurology as scheduled  Ascending aortic aneurysm-no increased DOE, slowly increasing physical activity.  She is status post a dental procedure with tissue AVR 2016.  Echocardiogram showed stable EF and well-functioning aortic valve.  Details above. Plan for repeat echocardiogram in 1 year.  Disposition: Follow-up with Dr. Martinique in 4-6 months.   Jossie Ng. Rafan Sanders NP-C     05/04/2022, 4:16 PM [place] [address] Office [phone] Fax [phone]    I spent 14 minutes examining this patient, reviewing medications, and using patient centered shared decision making involving her cardiac care.  Prior to her visit I spent greater than 20 minutes reviewing her past medical history,   medications, and prior cardiac tests.

## 2022-05-04 ENCOUNTER — Ambulatory Visit: Payer: 59 | Attending: General Practice | Admitting: General Practice

## 2022-05-04 ENCOUNTER — Encounter: Payer: Self-pay | Admitting: General Practice

## 2022-05-04 VITALS — BP 142/78 | HR 68 | Ht 60.0 in | Wt 97.4 lb

## 2022-05-04 DIAGNOSIS — I639 Cerebral infarction, unspecified: Secondary | ICD-10-CM

## 2022-05-04 DIAGNOSIS — I2510 Atherosclerotic heart disease of native coronary artery without angina pectoris: Secondary | ICD-10-CM | POA: Diagnosis not present

## 2022-05-04 DIAGNOSIS — I10 Essential (primary) hypertension: Secondary | ICD-10-CM

## 2022-05-04 DIAGNOSIS — E782 Mixed hyperlipidemia: Secondary | ICD-10-CM

## 2022-05-04 DIAGNOSIS — Z952 Presence of prosthetic heart valve: Secondary | ICD-10-CM | POA: Diagnosis not present

## 2022-05-04 DIAGNOSIS — I7121 Aneurysm of the ascending aorta, without rupture: Secondary | ICD-10-CM

## 2022-05-04 NOTE — Patient Instructions (Signed)
Medication Instructions:  TAKE IRBESARTAN 75MG  TWICE DAILY *If you need a refill on your cardiac medications before your next appointment, please call your pharmacy*   Lab Work: BMET IN 2 WEEKS THEN FASTING LIPID IN 3 MO BEFORE FOLLOW UP APPOINTMENT If you have labs (blood work) drawn today and your tests are completely normal, you will receive your results only by: [place] (if you have MyChart) OR  A paper copy in the mail If you have any lab test that is abnormal or we need to change your treatment, we will call you to review the results.  Testing/Procedures: NONE   Follow-Up: At [facility], you and your health needs are our priority.  As part of our continuing mission to provide you with exceptional heart care, we have created designated Provider Care Teams.  These Care Teams include your primary Cardiologist (physician) and Advanced Practice Providers (APPs -  Physician Assistants and Nurse Practitioners) who all work together to provide you with the care you need, when you need it.   Your next appointment:   4-6 month(s)  Provider:   Peter Martinique, MD     Other Instructions INCREASE FIBER IN YOUR DIET-SEE ATTACHED   High-Fiber Eating Plan Fiber, also called dietary fiber, is a type of carbohydrate. It is found foods such as fruits, vegetables, whole grains, and beans. A high-fiber diet can have many health benefits. Your health care provider may recommend a high-fiber diet to help: Prevent constipation. Fiber can make your bowel movements more regular. Lower your cholesterol. Relieve the following conditions: Inflammation of veins in the anus (hemorrhoids). Inflammation of specific areas of the digestive tract (uncomplicated diverticulosis). A problem of the large intestine, also called the colon, that sometimes causes pain and diarrhea (irritable bowel syndrome, or IBS). Prevent overeating as part of a weight-loss plan. Prevent heart disease, type 2  diabetes, and certain cancers. What are tips for following this plan? Reading food labels  Check the nutrition facts label on food products for the amount of dietary fiber. Choose foods that have 5 grams of fiber or more per serving. The goals for recommended daily fiber intake include: Men (age 39 or younger): 34-38 g. Men (over age 81): 28-34 g. Women (age 48 or younger): 25-28 g. Women (over age 62): 22-25 g. Your daily fiber goal is _____________ g. Shopping Choose whole fruits and vegetables instead of processed forms, such as apple juice or applesauce. Choose a wide variety of high-fiber foods such as avocados, lentils, oats, and kidney beans. Read the nutrition facts label of the foods you choose. Be aware of foods with added fiber. These foods often have high sugar and sodium amounts per serving. Cooking Use whole-grain flour for baking and cooking. Cook with brown rice instead of white rice. Meal planning Start the day with a breakfast that is high in fiber, such as a cereal that contains 5 g of fiber or more per serving. Eat breads and cereals that are made with whole-grain flour instead of refined flour or white flour. Eat brown rice, bulgur wheat, or millet instead of white rice. Use beans in place of meat in soups, salads, and pasta dishes. Be sure that half of the grains you eat each day are whole grains. General information You can get the recommended daily intake of dietary fiber by: Eating a variety of fruits, vegetables, grains, nuts, and beans. Taking a fiber supplement if you are not able to take in enough fiber in your diet. It  is better to get fiber through food than from a supplement. Gradually increase how much fiber you consume. If you increase your intake of dietary fiber too quickly, you may have bloating, cramping, or gas. Drink plenty of water to help you digest fiber. Choose high-fiber snacks, such as berries, raw vegetables, nuts, and popcorn. What foods  should I eat? Fruits Berries. Pears. Apples. Oranges. Avocado. Prunes and raisins. Dried figs. Vegetables Sweet potatoes. Spinach. Kale. Artichokes. Cabbage. Broccoli. Cauliflower. Green peas. Carrots. Squash. Grains Whole-grain breads. Multigrain cereal. Oats and oatmeal. Brown rice. Barley. Bulgur wheat. [place]. Quinoa. Bran muffins. Popcorn. Rye wafer crackers. Meats and other proteins Navy beans, kidney beans, and pinto beans. Soybeans. Split peas. Lentils. Nuts and seeds. Dairy Fiber-fortified yogurt. Beverages Fiber-fortified soy milk. Fiber-fortified orange juice. Other foods Fiber bars. The items listed above may not be a complete list of recommended foods and beverages. Contact a dietitian for more information. What foods should I avoid? Fruits Fruit juice. Cooked, strained fruit. Vegetables Fried potatoes. Canned vegetables. Well-cooked vegetables. Grains White bread. Pasta made with refined flour. White rice. Meats and other proteins Fatty cuts of meat. Fried chicken or fried fish. Dairy Milk. Yogurt. Cream cheese. Sour cream. Fats and oils Butters. Beverages Soft drinks. Other foods Cakes and pastries. The items listed above may not be a complete list of foods and beverages to avoid. Talk with your dietitian about what choices are best for you. Summary Fiber is a type of carbohydrate. It is found in foods such as fruits, vegetables, whole grains, and beans. A high-fiber diet has many benefits. It can help to prevent constipation, lower blood cholesterol, aid weight loss, and reduce your risk of heart disease, diabetes, and certain cancers. Increase your intake of fiber gradually. Increasing fiber too quickly may cause cramping, bloating, and gas. Drink plenty of water while you increase the amount of fiber you consume. The best sources of fiber include whole fruits and vegetables, whole grains, nuts, seeds, and beans. This information is not intended to replace  advice given to you by your health care provider. Make sure you discuss any questions you have with your health care provider. Document Revised: 07/04/2019 Document Reviewed: 07/04/2019 Elsevier Patient Education  [place].

## 2022-05-17 ENCOUNTER — Emergency Department (HOSPITAL_COMMUNITY): Payer: Medicare Other

## 2022-05-17 ENCOUNTER — Other Ambulatory Visit: Payer: Self-pay

## 2022-05-17 ENCOUNTER — Inpatient Hospital Stay (HOSPITAL_COMMUNITY)
Admission: EM | Admit: 2022-05-17 | Discharge: 2022-05-24 | DRG: 522 | Disposition: A | Discharge status: Skilled Nursing Facility | Payer: Medicare Other | Attending: Internal Medicine | Admitting: Internal Medicine

## 2022-05-17 ENCOUNTER — Encounter (HOSPITAL_COMMUNITY): Payer: Self-pay | Admitting: Internal Medicine

## 2022-05-17 DIAGNOSIS — E876 Hypokalemia: Secondary | ICD-10-CM | POA: Diagnosis present

## 2022-05-17 DIAGNOSIS — I429 Cardiomyopathy, unspecified: Secondary | ICD-10-CM | POA: Diagnosis present

## 2022-05-17 DIAGNOSIS — I4891 Unspecified atrial fibrillation: Secondary | ICD-10-CM | POA: Diagnosis not present

## 2022-05-17 DIAGNOSIS — I10 Essential (primary) hypertension: Secondary | ICD-10-CM

## 2022-05-17 DIAGNOSIS — I110 Hypertensive heart disease with heart failure: Secondary | ICD-10-CM | POA: Diagnosis present

## 2022-05-17 DIAGNOSIS — Y9301 Activity, walking, marching and hiking: Secondary | ICD-10-CM | POA: Diagnosis present

## 2022-05-17 DIAGNOSIS — M353 Polymyalgia rheumatica: Secondary | ICD-10-CM | POA: Diagnosis present

## 2022-05-17 DIAGNOSIS — Z79899 Other long term (current) drug therapy: Secondary | ICD-10-CM | POA: Diagnosis not present

## 2022-05-17 DIAGNOSIS — Z01810 Encounter for preprocedural cardiovascular examination: Secondary | ICD-10-CM | POA: Diagnosis not present

## 2022-05-17 DIAGNOSIS — S72002A Fracture of unspecified part of neck of left femur, initial encounter for closed fracture: Secondary | ICD-10-CM | POA: Diagnosis present

## 2022-05-17 DIAGNOSIS — E785 Hyperlipidemia, unspecified: Secondary | ICD-10-CM | POA: Diagnosis present

## 2022-05-17 DIAGNOSIS — I7121 Aneurysm of the ascending aorta, without rupture: Secondary | ICD-10-CM | POA: Diagnosis present

## 2022-05-17 DIAGNOSIS — F419 Anxiety disorder, unspecified: Secondary | ICD-10-CM | POA: Diagnosis present

## 2022-05-17 DIAGNOSIS — Z823 Family history of stroke: Secondary | ICD-10-CM | POA: Diagnosis not present

## 2022-05-17 DIAGNOSIS — D649 Anemia, unspecified: Secondary | ICD-10-CM | POA: Diagnosis not present

## 2022-05-17 DIAGNOSIS — Z953 Presence of xenogenic heart valve: Secondary | ICD-10-CM | POA: Diagnosis not present

## 2022-05-17 DIAGNOSIS — R112 Nausea with vomiting, unspecified: Secondary | ICD-10-CM | POA: Diagnosis present

## 2022-05-17 DIAGNOSIS — R54 Age-related physical debility: Secondary | ICD-10-CM | POA: Diagnosis present

## 2022-05-17 DIAGNOSIS — D72829 Elevated white blood cell count, unspecified: Secondary | ICD-10-CM | POA: Diagnosis present

## 2022-05-17 DIAGNOSIS — I452 Bifascicular block: Secondary | ICD-10-CM | POA: Diagnosis not present

## 2022-05-17 DIAGNOSIS — I5032 Chronic diastolic (congestive) heart failure: Secondary | ICD-10-CM | POA: Diagnosis present

## 2022-05-17 DIAGNOSIS — I4719 Other supraventricular tachycardia: Secondary | ICD-10-CM | POA: Diagnosis not present

## 2022-05-17 DIAGNOSIS — W010XXA Fall on same level from slipping, tripping and stumbling without subsequent striking against object, initial encounter: Secondary | ICD-10-CM | POA: Diagnosis present

## 2022-05-17 DIAGNOSIS — D62 Acute posthemorrhagic anemia: Secondary | ICD-10-CM | POA: Diagnosis not present

## 2022-05-17 DIAGNOSIS — Z66 Do not resuscitate: Secondary | ICD-10-CM

## 2022-05-17 DIAGNOSIS — Z8673 Personal history of transient ischemic attack (TIA), and cerebral infarction without residual deficits: Secondary | ICD-10-CM | POA: Diagnosis not present

## 2022-05-17 DIAGNOSIS — I2510 Atherosclerotic heart disease of native coronary artery without angina pectoris: Secondary | ICD-10-CM

## 2022-05-17 DIAGNOSIS — Y92039 Unspecified place in apartment as the place of occurrence of the external cause: Secondary | ICD-10-CM | POA: Diagnosis not present

## 2022-05-17 HISTORY — DX: Unspecified atrial fibrillation: I48.91

## 2022-05-17 LAB — I-STAT CHEM 8, ED
BUN: 12 mg/dL (ref 8–23)
Calcium, Ion: 1.06 mmol/L — ABNORMAL LOW (ref 1.15–1.40)
Chloride: 100 mmol/L (ref 98–111)
Creatinine, Ser: 0.6 mg/dL (ref 0.44–1.00)
Glucose, Bld: 124 mg/dL — ABNORMAL HIGH (ref 70–99)
HCT: 32 % — ABNORMAL LOW (ref 36.0–46.0)
Hemoglobin: 10.9 g/dL — ABNORMAL LOW (ref 12.0–15.0)
Potassium: 3.4 mmol/L — ABNORMAL LOW (ref 3.5–5.1)
Sodium: 138 mmol/L (ref 135–145)
TCO2: 26 mmol/L (ref 22–32)

## 2022-05-17 LAB — BASIC METABOLIC PANEL
Anion gap: 12 (ref 5–15)
BUN: 12 mg/dL (ref 8–23)
CO2: 24 mmol/L (ref 22–32)
Calcium: 8.8 mg/dL — ABNORMAL LOW (ref 8.9–10.3)
Chloride: 102 mmol/L (ref 98–111)
Creatinine, Ser: 0.62 mg/dL (ref 0.44–1.00)
GFR, Estimated: >60 mL/min (ref >60)
Glucose, Bld: 125 mg/dL — ABNORMAL HIGH (ref 70–99)
Potassium: 3.4 mmol/L — ABNORMAL LOW (ref 3.5–5.1)
Sodium: 138 mmol/L (ref 135–145)

## 2022-05-17 LAB — CBC
HCT: 31.7 % — ABNORMAL LOW (ref 36.0–46.0)
Hemoglobin: 9.7 g/dL — ABNORMAL LOW (ref 12.0–15.0)
MCH: 27.1 pg (ref 26.0–34.0)
MCHC: 30.6 g/dL (ref 30.0–36.0)
MCV: 88.5 fL (ref 80.0–100.0)
Platelets: 195 10*3/uL (ref 150–400)
RBC: 3.58 MIL/uL — ABNORMAL LOW (ref 3.87–5.11)
RDW: 13.6 % (ref 11.5–15.5)
WBC: 6.5 10*3/uL (ref 4.0–10.5)
nRBC: 0 % (ref 0.0–0.2)

## 2022-05-17 LAB — SAMPLE TO BLOOD BANK

## 2022-05-17 MED ORDER — ONDANSETRON HCL 4 MG/2ML IJ SOLN
4.0000 mg | Freq: Four times a day (QID) | INTRAMUSCULAR | Status: DC | PRN
  Administered 2022-05-17 – 2022-05-18 (×2): 4 mg via INTRAVENOUS
  Filled 2022-05-17: qty 2

## 2022-05-17 MED ORDER — MORPHINE SULFATE (PF) 2 MG/ML IV SOLN
2.0000 mg | INTRAVENOUS | Status: DC | PRN
  Administered 2022-05-18 – 2022-05-19 (×5): 2 mg via INTRAVENOUS
  Filled 2022-05-17 (×5): qty 1

## 2022-05-17 MED ORDER — DILTIAZEM HCL ER COATED BEADS 120 MG PO CP24
120.0000 mg | ORAL_CAPSULE | Freq: Two times a day (BID) | ORAL | Status: DC
  Administered 2022-05-18 – 2022-05-22 (×9): 120 mg via ORAL
  Filled 2022-05-17 (×12): qty 1

## 2022-05-17 MED ORDER — LABETALOL HCL 5 MG/ML IV SOLN
5.0000 mg | Freq: Once | INTRAVENOUS | Status: AC
  Administered 2022-05-17: 5 mg via INTRAVENOUS
  Filled 2022-05-17: qty 4

## 2022-05-17 MED ORDER — HYDRALAZINE HCL 20 MG/ML IJ SOLN
10.0000 mg | INTRAMUSCULAR | Status: DC | PRN
  Administered 2022-05-18 (×2): 10 mg via INTRAVENOUS
  Filled 2022-05-17 (×2): qty 1

## 2022-05-17 MED ORDER — SODIUM CHLORIDE 0.9 % IV SOLN
1000.0000 mL | INTRAVENOUS | Status: DC
  Administered 2022-05-17: 1000 mL via INTRAVENOUS

## 2022-05-17 MED ORDER — LACTATED RINGERS IV SOLN: INTRAVENOUS | Status: AC

## 2022-05-17 MED ORDER — SODIUM CHLORIDE 0.9 % IV BOLUS (SEPSIS)
500.0000 mL | Freq: Once | INTRAVENOUS | Status: AC
  Administered 2022-05-17: 500 mL via INTRAVENOUS

## 2022-05-17 MED ORDER — POTASSIUM CHLORIDE CRYS ER 20 MEQ PO TBCR
40.0000 meq | EXTENDED_RELEASE_TABLET | Freq: Once | ORAL | Status: AC
  Administered 2022-05-17: 40 meq via ORAL
  Filled 2022-05-17: qty 2

## 2022-05-17 MED ORDER — ATORVASTATIN CALCIUM 10 MG PO TABS
10.0000 mg | ORAL_TABLET | Freq: Every day | ORAL | Status: DC
  Administered 2022-05-17: 10 mg via ORAL
  Filled 2022-05-17: qty 1

## 2022-05-17 MED ORDER — ACETAMINOPHEN 325 MG PO TABS: 650.0000 mg | ORAL_TABLET | Freq: Four times a day (QID) | ORAL | Status: DC | PRN

## 2022-05-17 MED ORDER — HYDROCODONE-ACETAMINOPHEN 5-325 MG PO TABS
1.0000 | ORAL_TABLET | Freq: Four times a day (QID) | ORAL | Status: DC | PRN
  Administered 2022-05-18: 1 via ORAL
  Filled 2022-05-17 (×2): qty 1

## 2022-05-17 MED ORDER — MORPHINE SULFATE (PF) 4 MG/ML IV SOLN
4.0000 mg | Freq: Once | INTRAVENOUS | Status: AC
  Administered 2022-05-17: 4 mg via INTRAVENOUS
  Filled 2022-05-17: qty 1

## 2022-05-17 MED ORDER — IRBESARTAN 150 MG PO TABS
75.0000 mg | ORAL_TABLET | Freq: Two times a day (BID) | ORAL | Status: DC
  Administered 2022-05-17 – 2022-05-24 (×13): 75 mg via ORAL
  Filled 2022-05-17 (×15): qty 1

## 2022-05-17 MED ORDER — ONDANSETRON HCL 4 MG/2ML IJ SOLN
4.0000 mg | Freq: Once | INTRAMUSCULAR | Status: AC
  Administered 2022-05-17: 4 mg via INTRAVENOUS
  Filled 2022-05-17: qty 2

## 2022-05-17 NOTE — Assessment & Plan Note (Signed)
Admit to med/surg bed. Ortho(Dr. Lucia Gaskins) consulted by EDP. Npo after MN. Holding asa and plavix. SCDs for dvt prophylaxis.    05/17/22 1930  Revised Cardiac Risk Index (RCRI)  High Risk Surgery?  0  History of Ischemic Heart Disease? 0  History of Congestive Heart Failure? 0  History of Cerebrovascular Disease? 1  Pre-Op Treatment w/ Insulin? 0  Pre-Operative Creatinine > 2 mg/dL? 0  Total Points 1  Perioperative Risk of Major Cardiac Event is (%) 0.9

## 2022-05-17 NOTE — Assessment & Plan Note (Signed)
Stable. 

## 2022-05-17 NOTE — Assessment & Plan Note (Signed)
Continue avapro 75 mg daily and cardizem CD 120 mg bid

## 2022-05-17 NOTE — Subjective & Objective (Signed)
CC: fall at home HPI: 82 yo WF hx of HTN, CAD, s/p CVA 03/14/2022, PMR, hyperlipidemia, presents to ER after a fall at home. Pt lives in ALF([facility]). Pt states she was walking with her walker to her bedroom to take a nap. Lost her balance and fell onto her left side. Pt unable to bear weight on left leg.  Pt brought to ER by EMS.  On arrival, temp 100, HR 102, BP 210/80.  After pain controlled, BP 162/115.  Hip xrays show left femoral neck fracture.  EDP has consulted ortho(dr. Lucia Gaskins).  TRH consulted for admission.  Pt denies any head trauma. No CP or SOB.

## 2022-05-17 NOTE — Consult Note (Signed)
Brief orthopedic consult note: Full note to follow  I was called by the emergency physician for this patient who fell and sustained a left femoral neck fracture.  She has a cardiac history.  She will require surgery for her injury.  Likely hemiarthroplasty versus total hip arthroplasty given the fracture pattern.  We will consult with the orthopedic trauma team in the morning to find someone suited to perform her surgery.  Please keep n.p.o. past midnight.  Possibly surgery tomorrow but will await hospitalist workup and optimization.  Bedrest for now.  Hold anticoagulation.

## 2022-05-17 NOTE — Progress Notes (Signed)
   05/17/22 1930  Revised Cardiac Risk Index (RCRI)  High Risk Surgery?  0  History of Ischemic Heart Disease? 0  History of Congestive Heart Failure? 0  History of Cerebrovascular Disease? 1  Pre-Op Treatment w/ Insulin? 0  Pre-Operative Creatinine > 2 mg/dL? 0  Total Points 1  Perioperative Risk of Major Cardiac Event is (%) 0.9

## 2022-05-17 NOTE — Progress Notes (Signed)
Orthopedic Tech Progress Note Patient Details:  Yvonne Ballard March 20, 1940 [id number]  Patient ID: Yvonne Ballard, female   DOB: February 10, 1941, 82 y.o.   MRN: [id number] Patient does not meet criteria for ohf. Must be under 70 to use ohf. Yvonne Ballard 05/17/2022, 9:45 PM

## 2022-05-17 NOTE — Assessment & Plan Note (Addendum)
Verified with patient that she is a DNR/DNI. She will need yellow DNR form completed at discharge.

## 2022-05-17 NOTE — ED Notes (Addendum)
ED TO INPATIENT HANDOFF REPORT  ED Nurse Name and Phone #: Elease Swarm/ [phone]  S Name/Age/Gender Yvonne Ballard 82 y.o. female Room/Bed: 030C/030C  Code Status   Code Status: DNR  Ballard/SNF/Other Ballard Patient oriented to: self, place, time, and situation Is this baseline? Yes   Triage Complete: Triage complete  Chief Complaint Closed displaced fracture of left femoral neck ([place]) [S72.002A]  Triage Note No notes on file   Allergies No Known Allergies  Level of Care/Admitting Diagnosis ED Disposition     ED Disposition  Admit   Condition  --   [place]: [place] [100100]  Level of Care: Med-Surg [16]  May admit patient to [facility] or [facility] if equivalent level of care is available:: No  Covid Evaluation: Asymptomatic - no recent exposure (last 10 days) testing not required  Diagnosis: Closed displaced [id number]  Admitting Physician: Bridgett Larsson, [place]  Attending Physician: Bridgett Larsson, ERIC [id number]  Certification:: I certify this patient will need inpatient services for at least 2 midnights  Estimated Length of Stay: 3          B Medical/Surgery History Past Medical History:  Diagnosis Date   Abnormal ECG    Anxiety    pt. 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   Stroke ([place]) 03/14/2022   Weight loss    Past Surgical History:  Procedure Laterality Date   BENTALL PROCEDURE N/A 03/31/2014   Procedure: BIO-BENTALL PROCEDURE using a 61m Magna Ease Aortic  Tissue Valve, a 26 mm Terumo Gelweave Valsalva Graft, a 14x10x152mTerumo Gelweave Graft, and a 2853mtraight Hemashield Platinum Graft.;  Surgeon: PetIvin PootD;  [facility];  Laterality: N/A;   LEFT AND RIGHT HEART CATHETERIZATION WITH CORONARY ANGIOGRAM N/A 03/26/2014   Procedure: LEFT AND RIGHT HEART CATHETERIZATION WITH CORONARY ANGIOGRAM;  Surgeon: Peter M JorMartiniqueD;  [facility];  Service: Cardiovascular;  Laterality: N/A;   TEE WITHOUT CARDIOVERSION N/A 03/31/2014   Procedure: TRANSESOPHAGEAL ECHOCARDIOGRAM (TEE);  Surgeon: PetIvin PootD;  [facility] [place] [facility];  Laterality: N/A;   TONSILLECTOMY     TUBAL LIGATION       A IV Location/Drains/Wounds Patient Lines/Drains/Airways Status     Active Line/Drains/Airways     Name Placement date Placement time Site Days   Peripheral IV 05/17/22 18 G Anterior;Distal;Right Forearm 05/17/22  1711  Forearm  less than 1            Intake/Output Last 24 hours No intake or output data in the 24 hours ending 05/17/22 2058  Labs/Imaging Results for orders placed or performed during the hospital encounter of 05/17/22 (from the past 48 hour(s))  CBC     Status: Abnormal   Collection Time: 05/17/22  5:32 PM  Result Value Ref Range   WBC 6.5 4.0 - 10.5 K/uL   RBC 3.58 (L)  3.87 - 5.11 MIL/uL   Hemoglobin 9.7 (L) 12.0 - 15.0 g/dL   HCT 31.7 (L) 36.0 - 46.0 %   MCV 88.5 80.0 - 100.0 fL   MCH 27.1 26.0 - 34.0 pg   MCHC 30.6 30.0 - 36.0 g/dL   RDW 13.6 11.5 - 15.5 %   Platelets 195 150 - 400 K/uL   nRBC 0.0 0.0 - 0.2 %    Comment: Performed at [facility] [address], [place], [place] [id number]  Basic metabolic panel     Status: Abnormal   Collection Time: 05/17/22  5:32 PM  Result Value Ref Range   Sodium 138 135 - 145 mmol/L   Potassium 3.4 (L) 3.5 - 5.1 mmol/L   Chloride 102 98 - 111 mmol/L   CO2 24 22 - 32 mmol/L   Glucose, Bld 125 (H) 70 -  99 mg/dL    Comment: Glucose reference range applies only to samples taken after fasting for at least 8 hours.   BUN 12 8 - 23 mg/dL   Creatinine, Ser 0.62 0.44 - 1.00 mg/dL   Calcium 8.8 (L) 8.9 - 10.3 mg/dL   GFR, Estimated >60 >60 mL/min    Comment: (NOTE) Calculated using the CKD-EPI Creatinine Equation (2021)    Anion gap 12 5 - 15    Comment: Performed at [facility] [address], [place], [place] [address]  Sample to Blood Bank     Status: None   Collection Time: 05/17/22  5:32 PM  Result Value Ref Range   Blood Bank Specimen SAMPLE AVAILABLE FOR TESTING    Sample Expiration      05/20/2022,2359 Performed at [facility], [place] [address], [place], [place] [address]   I-stat chem 8, ED (not at [facility], [facility] or [facility])     Status: Abnormal   Collection Time: 05/17/22  5:40 PM  Result Value Ref Range   Sodium 138 135 - 145 mmol/L   Potassium 3.4 (L) 3.5 - 5.1 mmol/L   Chloride 100 98 - 111 mmol/L   BUN 12 8 - 23 mg/dL   Creatinine, Ser 0.60 0.44 - 1.00 mg/dL   Glucose, Bld 124 (H) 70 - 99 mg/dL    Comment: Glucose reference range applies only to samples taken after fasting for at least 8 hours.   Calcium, Ion 1.06 (L) 1.15 - 1.40 mmol/L   TCO2 26 22 - 32 mmol/L   Hemoglobin 10.9 (L) 12.0 - 15.0 g/dL   HCT 32.0 (L) 36.0 - 46.0 %   DG FEMUR MIN 2 VIEWS LEFT  Result Date: 05/17/2022 CLINICAL DATA:  Hip fracture EXAM: LEFT FEMUR 2 VIEWS COMPARISON:  05/17/2022 FINDINGS: There is a left femoral neck fracture with varus angulation. Old left superior and inferior pubic rami fractures. No subluxation or dislocation. No additional femoral abnormality. No joint effusion in the left knee. IMPRESSION: Left femoral neck fracture with varus angulation. Electronically Signed   By: Rolm Baptise M.D.   On: 05/17/2022 19:08   DG Hip [place] W or [place] Pelvis 1 View Left  Result Date: 05/17/2022 CLINICAL DATA:  Level 2 trauma.  Fall.  Left hip injury. EXAM: DG HIP (WITH OR WITHOUT  PELVIS) 1V PORT LEFT COMPARISON:  Pelvis and left hip radiographs 09/15/2016 FINDINGS: There is diffuse decreased bone mineralization. Interval healing of the previously seen minimally displaced fracture at the junction of the left superior pubic ramus and the left acetabulum in the mid  left inferior pubic ramus. There is a new transverse fracture of the proximal left femoral neck with approximately 11 mm lateral displacement and mild superior displacement of the distal fracture component with respect to the proximal fracture component. Mild-to-moderate varus angulation. The bilateral femoroacetabular joint spaces are maintained. Severe pubic symphysis joint space narrowing. Moderate atherosclerotic calcifications. IMPRESSION: Acute transverse fracture of the proximal left femoral neck with mild-to-moderate superolateral displacement of the distal fracture component and mild to moderate varus angulation. Electronically Signed   By: Yvonne Kendall M.D.   On: 05/17/2022 17:43   DG Chest Portable 1 View  Result Date: 05/17/2022 CLINICAL DATA:  Pain after fall EXAM: PORTABLE CHEST 1 VIEW COMPARISON:  Chest x-ray November 15, 2019 FINDINGS: The cardiomediastinal silhouette is stable with a markedly tortuous thoracic aorta. No pneumothorax. The lungs are clear. No acute abnormalities identified. IMPRESSION: No active disease. Electronically Signed   By: Dorise Bullion III M.D.   On: 05/17/2022 17:39    Pending Labs Unresulted Labs (From admission, onward)    None       Vitals/Pain Today's Vitals   05/17/22 1745 05/17/22 1822 05/17/22 1826 05/17/22 2039  BP: (!) 177/99 (!) 162/115  (!) 185/107  Pulse: (!) 102 96  (!) 53  Resp: 18 20    Temp:      TempSrc:      SpO2: 94% 97%    Weight:      Height:      PainSc:   8      Isolation Precautions No active isolations  Medications Medications  sodium chloride 0.9 % bolus 500 mL (0 mLs Intravenous Stopped 05/17/22 1812)    Followed by  0.9 %  sodium  chloride infusion (1,000 mLs Intravenous New Bag/Given 05/17/22 1824)  morphine (PF) 4 MG/ML injection 4 mg (4 mg Intravenous Given 05/17/22 1821)  ondansetron (ZOFRAN) injection 4 mg (4 mg Intravenous Given 05/17/22 1820)  labetalol (NORMODYNE) injection 5 mg (5 mg Intravenous Given 05/17/22 2040)  potassium chloride SA (KLOR-CON M) CR tablet 40 mEq (40 mEq Oral Given 05/17/22 2040)    Mobility walks with person assist     Focused Assessments Neuro Assessment Handoff:  Swallow screen pass? N/A  Cardiac Rhythm: Atrial fibrillation       Neuro Assessment: Exceptions to WDL Neuro Checks:      Has TPA been given? N/A If patient is a Neuro Trauma and patient is going to OR before floor call report to [place] nurse: [phone] or [phone]   R Recommendations: See Admitting Provider Note  Report given to:   Additional Notes: Pt came in for a fall but is A&Ox 4. She had labetalol due to her HTN and and potassium tabs due to her K being 3.4. She has NaCl going 149m/hr. She got morphine and zofran earlier and she just threw up so she probably needs some more. She has purewick on and family at bedside.

## 2022-05-17 NOTE — Assessment & Plan Note (Signed)
Replete with po kcl 40 meq

## 2022-05-17 NOTE — Progress Notes (Signed)
Orthopedic Tech Progress Note Patient Details:  Yvonne Ballard [birth year] [id number] Level 2 Trauma. Not currently needed Patient ID: Yvonne Ballard, female   DOB: 06/10/1940, 82 y.o.   MRN: [id number]  Yvonne Ballard 05/17/2022, 5:48 PM

## 2022-05-17 NOTE — H&P (Addendum)
History and Physical    Yvonne Ballard [id number] DOB: Jan 14, 1941 DOA: 05/17/2022  DOS: the patient was seen and examined on 05/17/2022  PCP: [name], MD   Patient coming from:  ALF([facility])  I have personally briefly reviewed patient's old medical records in [facility]  CC: fall at home HPI: 82 yo WF hx of HTN, CAD, s/p CVA 03/14/2022, PMR, hyperlipidemia, presents to ER after a fall at home. Pt lives in ALF([facility]). Pt states she was walking with her walker to her bedroom to take a nap. Lost her balance and fell onto her left side. Pt unable to bear weight on left leg.  Pt brought to ER by EMS.  On arrival, temp 100, HR 102, BP 210/80.  After pain controlled, BP 162/115.  Hip xrays show left femoral neck fracture.  EDP has consulted ortho(dr. Lucia Gaskins).  TRH consulted for admission.  Pt denies any head trauma. No CP or SOB.   ED Course: xrays show left femoral neck fracture  Review of Systems:  Review of Systems  Constitutional: Negative.   HENT: Negative.    Eyes: Negative.   Respiratory: Negative.    Cardiovascular: Negative.   Gastrointestinal: Negative.   Genitourinary: Negative.   Musculoskeletal:  Positive for falls and joint pain.       Left hip pain  Neurological: Negative.   Endo/Heme/Allergies: Negative.   Psychiatric/Behavioral: Negative.    All other systems reviewed and are negative.   Past Medical History:  Diagnosis Date   Abnormal ECG    Anxiety    pt. reports that she is nervous person   Anxiety disorder    Ascending aortic aneurysm (HCC)    s/p repair with Bental procedure   Coronary artery disease 03/2014   mild non obstructive CAD   Dyslipidemia, goal LDL below 70 07/26/2014   Family history of adverse reaction to anesthesia    Heart murmur    History of hiatal hernia    Hypertension    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HCC)    S/P AVR (aortic valve replacement)    tissue valve   Stroke (cerebrum) ([place])  02/07/2017   Stroke ([place]) 03/14/2022   Weight loss     Past Surgical History:  Procedure Laterality Date   BENTALL PROCEDURE N/A 03/31/2014   Procedure: BIO-BENTALL PROCEDURE using a 78m Magna Ease Aortic Tissue Valve, a 26 mm Terumo Gelweave Valsalva Graft, a 14x10x117mTerumo Gelweave Graft, and a 2877mtraight Hemashield Platinum Graft.;  Surgeon: PetIvin PootD;  [facility];  Laterality: N/A;   LEFT AND RIGHT HEART CATHETERIZATION WITH CORONARY ANGIOGRAM N/A 03/26/2014   Procedure: LEFT AND RIGHT HEART CATHETERIZATION WITH CORONARY ANGIOGRAM;  Surgeon: Peter M JorMartiniqueD;  [facility];  Service: Cardiovascular;  Laterality: N/A;   TEE WITHOUT CARDIOVERSION N/A 03/31/2014   Procedure: TRANSESOPHAGEAL ECHOCARDIOGRAM (TEE);  Surgeon: PetIvin PootD;  [facility];  Laterality: N/A;   TONSILLECTOMY     TUBAL LIGATION       reports that she has never smoked. She has never used smokeless tobacco. She reports current alcohol use. She reports that she does not use drugs.  No Known Allergies  Family History  Problem Relation Age of Onset   Cancer Mother    Cancer Father    Stroke Father    Cancer Brother     Prior to Admission medications   Medication Sig  Start Date End Date Taking? Authorizing Provider  ALPRAZolam Duanne Moron) 0.25 MG tablet Take 0.5 tablets (0.125 mg total) by mouth daily as needed for anxiety. 03/18/22   Bonnielee Haff, MD  diltiazem (CARDIZEM CD) 120 MG 24 hr capsule Take 1 capsule (120 mg total) by mouth in the morning and at bedtime. 04/13/22   Martinique, Peter M, MD  irbesartan (AVAPRO) 150 MG tablet TAKE 1/2 TABLET TWICE A DAY. Patient taking differently: Take 75 mg by mouth 2 (two) times daily. 11/26/21   [place]ique, Peter M, MD  ASA 81 mg daily Plavix 75 mg daily   Physical Exam: Vitals:   05/17/22 1720 05/17/22 1722 05/17/22 1745 05/17/22 1822  BP: (!) 210/80  (!) 177/99 (!) 162/115   Pulse:   (!) 102 96  Resp:   18 20  Temp: 100 F (37.8 C)     TempSrc: Oral     SpO2:   94% 97%  Weight:  44.2 kg    Height:  5' (1.524 m)      Physical Exam Vitals and nursing note reviewed.  Constitutional:      General: She is not in acute distress.    Appearance: She is not toxic-appearing.     Comments: Thin, female. No distress  HENT:     Head: Normocephalic and atraumatic.     Nose: Nose normal.  Eyes:     General: No scleral icterus. Cardiovascular:     Rate and Rhythm: Normal rate and regular rhythm.     Pulses: Normal pulses.  Pulmonary:     Effort: Pulmonary effort is normal. No respiratory distress.     Breath sounds: Normal breath sounds.  Abdominal:     General: Abdomen is flat. Bowel sounds are normal. There is no distension.     Palpations: Abdomen is soft.  Musculoskeletal:     Comments: Left LE shortened  Skin:    General: Skin is warm and dry.     Capillary Refill: Capillary refill takes less than 2 seconds.  Neurological:     General: No focal deficit present.     Mental Status: She is alert and oriented to person, place, and time.      Labs on Admission: I have personally reviewed following labs and imaging studies  CBC: Recent Labs  Lab 05/17/22 1732 05/17/22 1740  WBC 6.5  --   HGB 9.7* 10.9*  HCT 31.7* 32.0*  MCV 88.5  --   PLT 195  --    Basic Metabolic Panel: Recent Labs  Lab 05/17/22 1732 05/17/22 1740  NA 138 138  K 3.4* 3.4*  CL 102 100  CO2 24  --   GLUCOSE 125* 124*  BUN 12 12  CREATININE 0.62 0.60  CALCIUM 8.8*  --    GFR: Estimated Creatinine Clearance: 37.2 mL/min (by C-G formula based on SCr of 0.6 mg/dL).  Urine analysis:    Component Value Date/Time   COLORURINE STRAW (A) 04/14/2018 1717   APPEARANCEUR CLEAR 04/14/2018 1717   LABSPEC 1.006 04/14/2018 1717   PHURINE 7.0 04/14/2018 1717   GLUCOSEU NEGATIVE 04/14/2018 1717   HGBUR SMALL (A) 04/14/2018 1717   BILIRUBINUR NEGATIVE 04/14/2018 1717    KETONESUR NEGATIVE 04/14/2018 1717   PROTEINUR NEGATIVE 04/14/2018 1717   UROBILINOGEN 0.2 04/19/2014 0610   NITRITE NEGATIVE 04/14/2018 1717   LEUKOCYTESUR SMALL (A) 04/14/2018 1717    Radiological Exams on Admission: I have personally reviewed images DG FEMUR MIN 2 VIEWS LEFT  Result Date: 05/17/2022  CLINICAL DATA:  Hip fracture EXAM: LEFT FEMUR 2 VIEWS COMPARISON:  05/17/2022 FINDINGS: There is a left femoral neck fracture with varus angulation. Old left superior and inferior pubic rami fractures. No subluxation or dislocation. No additional femoral abnormality. No joint effusion in the left knee. IMPRESSION: Left femoral neck fracture with varus angulation. Electronically Signed   By: Rolm Baptise M.D.   On: 05/17/2022 19:08   DG Hip Port [place] W or [place] Pelvis 1 View Left  Result Date: 05/17/2022 CLINICAL DATA:  Level 2 trauma.  Fall.  Left hip injury. EXAM: DG HIP (WITH OR WITHOUT PELVIS) 1V PORT LEFT COMPARISON:  Pelvis and left hip radiographs 09/15/2016 FINDINGS: There is diffuse decreased bone mineralization. Interval healing of the previously seen minimally displaced fracture at the junction of the left superior pubic ramus and the left acetabulum in the mid left inferior pubic ramus. There is a new transverse fracture of the proximal left femoral neck with approximately 11 mm lateral displacement and mild superior displacement of the distal fracture component with respect to the proximal fracture component. Mild-to-moderate varus angulation. The bilateral femoroacetabular joint spaces are maintained. Severe pubic symphysis joint space narrowing. Moderate atherosclerotic calcifications. IMPRESSION: Acute transverse fracture of the proximal left femoral neck with mild-to-moderate superolateral displacement of the distal fracture component and mild to moderate varus angulation. Electronically Signed   By: Yvonne Kendall M.D.   On: 05/17/2022 17:43   DG Chest Portable 1 View  Result Date:  05/17/2022 CLINICAL DATA:  Pain after fall EXAM: PORTABLE CHEST 1 VIEW COMPARISON:  Chest x-ray November 15, 2019 FINDINGS: The cardiomediastinal silhouette is stable with a markedly tortuous thoracic aorta. No pneumothorax. The lungs are clear. No acute abnormalities identified. IMPRESSION: No active disease. Electronically Signed   By: Dorise Bullion III M.D.   On: 05/17/2022 17:39    EKG: My personal interpretation of EKG shows: NSR with PACs, I disagree with computer interpretation. There are clear p-waves in V3    Assessment/Plan Principal Problem:   Closed displaced fracture of left femoral neck (HCC) Active Problems:   Benign essential HTN   CAD (coronary artery disease), native coronary artery   Dyslipidemia, goal LDL below 70   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HCC)   History of CVA (cerebrovascular accident)- 03/14/2022   DNR (do not resuscitate)/DNI(Do Not Intubate)   Hypokalemia    Assessment and Plan: * Closed displaced fracture of left femoral neck ([place]) Admit to med/surg bed. Ortho(Dr. Lucia Gaskins) consulted by EDP. Npo after MN. Holding asa and plavix. SCDs for dvt prophylaxis.    05/17/22 1930  Revised Cardiac Risk Index (RCRI)  High Risk Surgery?  0  History of Ischemic Heart Disease? 0  History of Congestive Heart Failure? 0  History of Cerebrovascular Disease? 1  Pre-Op Treatment w/ Insulin? 0  Pre-Operative Creatinine > 2 mg/dL? 0  Total Points 1  Perioperative Risk of Major Cardiac Event is (%) 0.9     History of CVA (cerebrovascular accident)- 03/14/2022 Has cva in 03/14/2022. Still on asa and plavix. Will hold for ortho surgery. Ortho to approve restarting of asa/plavix after surgery.  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HCC) Stable.  Dyslipidemia, goal LDL below 70 Do not see statin on her MAR. Start lipitor 10 mg daily.  CAD (coronary artery disease), native coronary artery Stable.  Benign essential HTN Continue avapro 75 mg daily and cardizem CD 120 mg  bid  Hypokalemia Replete with po kcl 40 meq  DNR (do not resuscitate)/DNI(Do Not Intubate) Verified with  patient that she is a DNR/DNI. She will need yellow DNR form completed at discharge.   DVT prophylaxis: SCDs Code Status: DNR/DNI(Do NOT Intubate). Discussed with pt at bedside Family Communication: no family at bedside  Disposition Plan: return to ALF vs SNF  Consults called: EDP has consulted ortho(dr. Lucia Gaskins)  Admission status: Inpatient, Med-Surg   Kristopher Oppenheim, DO [facility] 05/17/2022, 7:51 PM

## 2022-05-17 NOTE — Assessment & Plan Note (Signed)
Do not see statin on her MAR. Start lipitor 10 mg daily.

## 2022-05-17 NOTE — Assessment & Plan Note (Signed)
Has cva in 03/14/2022. Still on asa and plavix. Will hold for ortho surgery. Ortho to approve restarting of asa/plavix after surgery.

## 2022-05-17 NOTE — Progress Notes (Signed)
   05/17/22 1720  Spiritual Encounters  Type of Visit Attempt (pt unavailable)  Care provided to: Pt not available  Referral source Trauma page  Reason for visit Trauma  OnCall Visit Yes   Chaplain responded to a level two trauma. The patient, Yvonne Ballard, was being attended to by the medical team. No family is present. If a chaplain is requested someone will respond.   Danice Goltz [facility]  [phone]

## 2022-05-17 NOTE — ED Provider Notes (Signed)
[facility] Provider Note   CSN: [id number] Arrival date & time: 05/17/22  1709     History  Chief complaint: Hip pain  Yvonne Ballard is a 82 y.o. female.  HPI   Patient has a history of hypertension, anxiety, coronary artery disease, ascending aortic aneurysm, aortic valve replacement, polymyalgia rheumatica, prior stroke.  Patient states she was at home doing exercises.  She was walking around with a walker.  She had a stroke in January and has been trying to keep up with her activity.  Patient states after her walk she ended up losing her balance and falling on her left hip.  Patient denies hitting her head.  She did not lose consciousness.  She is having pain in her left hip socket area but denies any pain in her chest or upper extremities.  No abdominal pain.  No neck pain.  No back pain.  No numbness or weakness.  Home Medications Prior to Admission medications   Medication Sig Start Date End Date Taking? Authorizing Provider  ALPRAZolam Duanne Moron) 0.25 MG tablet Take 0.5 tablets (0.125 mg total) by mouth daily as needed for anxiety. 03/18/22   Bonnielee Haff, MD  diltiazem (CARDIZEM CD) 120 MG 24 hr capsule Take 1 capsule (120 mg total) by mouth in the morning and at bedtime. 04/13/22   Martin[place], Peter M, MD  irbesartan (AVAPRO) 150 MG tablet TAKE 1/2 TABLET TWICE A DAY. Patient taking differently: Take 75 mg by mouth 2 (two) times daily. 11/26/21   [place], Peter M, MD      Allergies    Patient has no known allergies.    Review of Systems   Review of Systems  Physical Exam Updated Vital Signs BP (!) 162/115   Pulse 96   Temp 100 F (37.8 C) (Oral)   Resp 20   Ht 1.524 m (5')   Wt 44.2 kg   SpO2 97%   BMI 19.02 kg/m  Physical Exam Vitals and nursing note reviewed.  Constitutional:      General: She is not in acute distress.    Comments: Early, frail  HENT:     Head: Normocephalic and atraumatic.     Right Ear: External ear  normal.     Left Ear: External ear normal.  Eyes:     General: No scleral icterus.       Right eye: No discharge.        Left eye: No discharge.     Conjunctiva/sclera: Conjunctivae normal.  Neck:     Trachea: No tracheal deviation.  Cardiovascular:     Rate and Rhythm: Normal rate. Rhythm irregular.     Heart sounds: Murmur heard.  Pulmonary:     Effort: Pulmonary effort is normal. No respiratory distress.     Breath sounds: Normal breath sounds. No stridor. No wheezing or rales.  Abdominal:     General: Bowel sounds are normal. There is no distension.     Palpations: Abdomen is soft.     Tenderness: There is no abdominal tenderness. There is no guarding or rebound.  Musculoskeletal:        General: Swelling and tenderness present.     Cervical back: Normal and neck supple.     Thoracic back: Normal.     Lumbar back: Normal.     Left hip: Deformity, tenderness and bony tenderness present. Decreased range of motion.     Left knee: Normal.     Comments: No  tenderness palpation right upper extremity left upper extremity, right lower extremity  Skin:    General: Skin is warm and dry.     Findings: No rash.  Neurological:     General: No focal deficit present.     Mental Status: She is alert.     Cranial Nerves: No cranial nerve deficit, dysarthria or facial asymmetry.     Sensory: No sensory deficit.     Motor: No abnormal muscle tone or seizure activity.     Coordination: Coordination normal.  Psychiatric:        Mood and Affect: Mood normal.     ED Results / Procedures / Treatments   Labs (all labs ordered are listed, but only abnormal results are displayed) Labs Reviewed  CBC - Abnormal; Notable for the following components:      Result Value   RBC 3.58 (*)    Hemoglobin 9.7 (*)    HCT 31.7 (*)    All other components within normal limits  BASIC METABOLIC PANEL - Abnormal; Notable for the following components:   Potassium 3.4 (*)    Glucose, Bld 125 (*)     Calcium 8.8 (*)    All other components within normal limits  I-STAT CHEM 8, ED - Abnormal; Notable for the following components:   Potassium 3.4 (*)    Glucose, Bld 124 (*)    Calcium, Ion 1.06 (*)    Hemoglobin 10.9 (*)    HCT 32.0 (*)    All other components within normal limits  SAMPLE TO BLOOD BANK    EKG EKG Interpretation  Date/Time:  Tuesday May 17 2022 17:50:35 EST Ventricular Rate:  77 PR Interval:    QRS Duration: 145 QT Interval:  438 QTC Calculation: 496 R Axis:   44 Text Interpretation: Atrial fibrillation Right bundle branch block LVH with secondary repolarization abnormality Confirmed by Dorie Rank [phone]) on 05/17/2022 6:01:24 PM  Radiology DG FEMUR MIN 2 VIEWS LEFT  Result Date: 05/17/2022 CLINICAL DATA:  Hip fracture EXAM: LEFT FEMUR 2 VIEWS COMPARISON:  05/17/2022 FINDINGS: There is a left femoral neck fracture with varus angulation. Old left superior and inferior pubic rami fractures. No subluxation or dislocation. No additional femoral abnormality. No joint effusion in the left knee. IMPRESSION: Left femoral neck fracture with varus angulation. Electronically Signed   By: Rolm Baptise M.D.   On: 05/17/2022 19:08   DG Hip Port [place] or [place] Pelvis 1 View Left  Result Date: 05/17/2022 CLINICAL DATA:  Level 2 trauma.  Fall.  Left hip injury. EXAM: DG HIP (WITH OR WITHOUT PELVIS) 1V PORT LEFT COMPARISON:  Pelvis and left hip radiographs 09/15/2016 FINDINGS: There is diffuse decreased bone mineralization. Interval healing of the previously seen minimally displaced fracture at the junction of the left superior pubic ramus and the left acetabulum in the mid left inferior pubic ramus. There is a new transverse fracture of the proximal left femoral neck with approximately 11 mm lateral displacement and mild superior displacement of the distal fracture component with respect to the proximal fracture component. Mild-to-moderate varus angulation. The bilateral femoroacetabular  joint spaces are maintained. Severe pubic symphysis joint space narrowing. Moderate atherosclerotic calcifications. IMPRESSION: Acute transverse fracture of the proximal left femoral neck with mild-to-moderate superolateral displacement of the distal fracture component and mild to moderate varus angulation. Electronically Signed   By: Yvonne Kendall M.D.   On: 05/17/2022 17:43   DG Chest Portable 1 View  Result Date: 05/17/2022 CLINICAL DATA:  Pain after fall EXAM: PORTABLE CHEST 1 VIEW COMPARISON:  Chest x-ray November 15, 2019 FINDINGS: The cardiomediastinal silhouette is stable with a markedly tortuous thoracic aorta. No pneumothorax. The lungs are clear. No acute abnormalities identified. IMPRESSION: No active disease. Electronically Signed   By: Dorise Bullion III M.D.   On: 05/17/2022 17:39    Procedures Procedures    Medications Ordered in ED Medications  sodium chloride 0.9 % bolus 500 mL (0 mLs Intravenous Stopped 05/17/22 1812)    Followed by  0.9 %  sodium chloride infusion (1,000 mLs Intravenous New Bag/Given 05/17/22 1824)  morphine (PF) 4 MG/ML injection 4 mg (4 mg Intravenous Given 05/17/22 1821)  ondansetron (ZOFRAN) injection 4 mg (4 mg Intravenous Given 05/17/22 1820)    ED Course/ Medical Decision Making/ A&P Clinical Course as of 05/17/22 1925  Tue May 17, 2022  [place] Case discussed with Dr. Lucia Gaskins regarding the patient's hip fracture [JK]  1839 CBC(!) Hemoglobin decreased compared to previous values.  Metabolic panel normal [JK]  1840 Chest x-ray without acute findings, hip x-ray shows femoral neck hip fracture [JK]  1925 Case discussed with Dr. Bridgett Larsson regarding admission [JK]    Clinical Course User Index [JK] Dorie Rank, MD                             Medical Decision Making Problems Addressed: Closed fracture of left hip, initial encounter [facility]): acute illness or injury that poses a threat to life or bodily functions  Amount and/or Complexity of Data Reviewed Labs:  ordered. Decision-making details documented in ED Course. Radiology: ordered and independent interpretation performed.  Risk Prescription drug management. Decision regarding hospitalization.   Presented to the ED for evaluation after losing her balance and falling.  Patient with obvious hip deformity on arrival.  X-rays do show a femoral neck fracture.  I discussed the case with Dr. Lucia Gaskins orthopedics.  Orthopedics team will plan on operative intervention.  Lab tests are stable.  I discussed with Dr. Bridgett Larsson and patient will be admitted to the hospital for further treatment evaluation.        Final Clinical Impression(s) / ED Diagnoses Final diagnoses:  Closed fracture of left hip, initial encounter [facility])    Rx / DC Orders ED Discharge Orders     None         Dorie Rank, MD 05/17/22 [phone]

## 2022-05-18 ENCOUNTER — Inpatient Hospital Stay (HOSPITAL_COMMUNITY): Payer: Medicare Other | Admitting: Anesthesiology

## 2022-05-18 ENCOUNTER — Encounter (HOSPITAL_COMMUNITY): Payer: Self-pay | Admitting: Internal Medicine

## 2022-05-18 DIAGNOSIS — S72002A Fracture of unspecified part of neck of left femur, initial encounter for closed fracture: Secondary | ICD-10-CM | POA: Diagnosis not present

## 2022-05-18 DIAGNOSIS — I2510 Atherosclerotic heart disease of native coronary artery without angina pectoris: Secondary | ICD-10-CM | POA: Diagnosis not present

## 2022-05-18 DIAGNOSIS — I10 Essential (primary) hypertension: Secondary | ICD-10-CM | POA: Diagnosis not present

## 2022-05-18 DIAGNOSIS — Z01810 Encounter for preprocedural cardiovascular examination: Secondary | ICD-10-CM | POA: Diagnosis not present

## 2022-05-18 DIAGNOSIS — E785 Hyperlipidemia, unspecified: Secondary | ICD-10-CM | POA: Diagnosis not present

## 2022-05-18 LAB — GLUCOSE, CAPILLARY: Glucose-Capillary: 134 mg/dL — ABNORMAL HIGH (ref 70–99)

## 2022-05-18 LAB — SURGICAL PCR SCREEN
MRSA, PCR: POSITIVE — AB
Staphylococcus aureus: POSITIVE — AB

## 2022-05-18 LAB — TYPE AND SCREEN
ABO/RH(D): AB POS
Antibody Screen: NEGATIVE

## 2022-05-18 MED ORDER — ENSURE ENLIVE PO LIQD
237.0000 mL | Freq: Two times a day (BID) | ORAL | Status: DC
  Administered 2022-05-22: 237 mL via ORAL

## 2022-05-18 MED ORDER — ROPIVACAINE HCL 5 MG/ML IJ SOLN
INTRAMUSCULAR | Status: DC | PRN
  Administered 2022-05-18: 20 mL via PERINEURAL

## 2022-05-18 MED ORDER — ONDANSETRON HCL 4 MG/2ML IJ SOLN
INTRAMUSCULAR | Status: AC
  Filled 2022-05-18: qty 2

## 2022-05-18 MED ORDER — METOCLOPRAMIDE HCL 5 MG/ML IJ SOLN
5.0000 mg | Freq: Three times a day (TID) | INTRAMUSCULAR | Status: DC | PRN
  Administered 2022-05-18: 5 mg via INTRAVENOUS
  Filled 2022-05-18 (×2): qty 2

## 2022-05-18 MED ORDER — ADULT MULTIVITAMIN W/MINERALS CH
1.0000 | ORAL_TABLET | Freq: Every day | ORAL | Status: DC
  Administered 2022-05-18 – 2022-05-24 (×6): 1 via ORAL
  Filled 2022-05-18 (×6): qty 1

## 2022-05-18 MED ORDER — DEXAMETHASONE SODIUM PHOSPHATE 4 MG/ML IJ SOLN
INTRAMUSCULAR | Status: DC | PRN
  Administered 2022-05-18: 10 mg via PERINEURAL

## 2022-05-18 NOTE — Consult Note (Signed)
Cardiology Consultation   Patient ID: Yvonne Ballard MRN: [id number]; DOB: Dec 27, 1940  Admit date: 05/17/2022 Date of Consult: 05/18/2022  PCP:  [name], MD   [facility] Prov[facility] Cardiologist:  Peter Martinique, MD   Patient Profile:   Yvonne Ballard is a 82 y.o. female with a hx of diastolic dysfunction without heart failure, aortic stenosis status post AVR, ascending aortic aneurysm (6.5cm), mild nonobstructive CAD, CVA, HTN, HLD, PMR, who is being seen 05/18/2022 for the evaluation of preoperative clearance for surgery due to a closed displaced fracture of the left femoral neck at the request of Dr. Lupita Leash. Plan for surgery tomorrow 05/19/2022  History of Present Illness:   [name] overall, appears to be in moderate discomfort as to be expected from the pain from fall, but is not complaining of any SOB, palpitations, chest pain, or swelling of the legs.   She has  history of a large thoracic aneurysm and had a heart catheterization in 2016 which showed mild nonobstructive coronary artery disease and mild left ventricular dysfunction with an EF of 45%.  She underwent stenting and a arch TAA repair with a Bentall procedure and tissue AVR and aortic brachiocephalic bypass and aortic left common carotid bypass.  In 2018 she was noted with left facial droop and dysarthria and had an MRI which showed evidence of moderate chronic microvascular changes and was started on atorvastatin and aspirin.  Later in 2021 she was admitted for right central retinal artery occlusion.  MRI showed small vessel disease without CVA.  Then she was admitted once again in January 2024 for new onset left-sided weakness.  MRI at that time showed acute CVA.  Started on aspirin and Plavix for 3 weeks followed by Plavix alone. Since then she has been on Cardizem and irbesartan for management of her hypertension.  Recent office visit shows that she is supposed to be taking her Plavix and aspirin when she was  discharged from rehab but states she was only been taking her irbesartan 75 mg daily instead of twice a day.  Counseling was provided and she was agreeable to taking the irbesartan twice daily but was not interested in medication for her hyperlipidemia, but wanted to try diet and exercise. Home meds include aspirin, Plavix, diltiazem, irbesartan. However, there has been some miscommunication on what she should be taking and therefore has not been taking her plavix since rehab and occasionally takes aspirin when she thinks is necessary/for headaches.   During my interview, she reports activity at her independent living facility prior to her stroke in Jan 2024. She was able to climb 8 steps to the dining hall. She denies exertional chest pain. She was doing well after short SNF/rehab following her stroke. Unfortunately, she turned and lost her bathroom when trying to ambulate to the bathroom and suffered a fall on her left hip (mechanical fall).   Past Medical History:  Diagnosis Date   Abnormal ECG    Anxiety    pt. reports that she is nervous person   Anxiety disorder    Ascending aortic aneurysm [facility])    s/p repair with Bental procedure   Coronary artery disease 03/2014   mild non obstructive CAD   Dyslipidemia, goal LDL below 70 07/26/2014   Family history of adverse reaction to anesthesia    Heart murmur    History of hiatal hernia    Hypertension    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HCC)    S/P AVR (aortic valve replacement)  tissue valve   Stroke (cerebrum) ([place]) 02/07/2017   Stroke ([place]) 03/14/2022   Weight loss     Past Surgical History:  Procedure Laterality Date   BENTALL PROCEDURE N/A 03/31/2014   Procedure: BIO-BENTALL PROCEDURE using a 33m Magna Ease Aortic Tissue Valve, a 26 mm Terumo Gelweave Valsalva Graft, a 14x10x157mTerumo Gelweave Graft, and a 2858mtraight Hemashield Platinum Graft.;  Surgeon: PetIvin PootD;  [facility];   Laterality: N/A;   LEFT AND RIGHT HEART CATHETERIZATION WITH CORONARY ANGIOGRAM N/A 03/26/2014   Procedure: LEFT AND RIGHT HEART CATHETERIZATION WITH CORONARY ANGIOGRAM;  Surgeon: Peter M JorMartiniqueD;  [facility];  Service: Cardiovascular;  Laterality: N/A;   TEE WITHOUT CARDIOVERSION N/A 03/31/2014   Procedure: TRANSESOPHAGEAL ECHOCARDIOGRAM (TEE);  Surgeon: PetIvin PootD;  [facility];  Laterality: N/A;   TONSILLECTOMY     TUBAL LIGATION       Inpatient Medications: Scheduled Meds:  diltiazem  120 mg Oral BID   irbesartan  75 mg Oral BID   ondansetron       Continuous Infusions:  lactated ringers 75 mL/hr at 05/18/22 1346   PRN Meds: acetaminophen, hydrALAZINE, HYDROcodone-acetaminophen, metoCLOPramide (REGLAN) injection, morphine injection, ondansetron, ondansetron (ZOFRAN) IV  Allergies:   No Known Allergies  Social History:   Social History   Socioeconomic History   Marital status: Widowed    Spouse name: Not on file   Number of children: 2   Years of education: Not on file   Highest education level: Not on file  Occupational History   Not on file  Tobacco Use   Smoking status: Never   Smokeless tobacco: Never  Vaping Use   Vaping Use: Never used  Substance and Sexual Activity   Alcohol use: Yes    Comment: " I like wine", - irregular consumption - socially has wine     Drug use: No   Sexual activity: Not on file  Other Topics Concern   Not on file  Social History Narrative   Not on file   Social Determinants of Health   Financial Resource Strain: Not on file  Food Insecurity: No Food Insecurity (05/17/2022)   Hunger Vital Sign    Worried About Running Out of Food in the Last Year: Never true    Ran Out of Food in the Last Year: Never true  Transportation Needs: No Transportation Needs (05/17/2022)   PRAPARE - TraHydrologistedical): No    Lack of Transportation (Non-Medical): No   Physical Activity: Not on file  Stress: Not on file  Social Connections: Not on file  Intimate Partner Violence: Not At Risk (05/17/2022)   Humiliation, Afraid, Rape, and Kick questionnaire    Fear of Current or Ex-Partner: No    Emotionally Abused: No    Physically Abused: No    Sexually Abused: No    Family History:   Family History  Problem Relation Age of Onset   Cancer Mother    Cancer Father    Stroke Father    Cancer Brother      ROS:  Please see the history of present illness.  All other ROS reviewed and negative.     Physical Exam/Data:   Vitals:   05/18/22 1005 05/18/22 1010 05/18/22 1100 05/18/22 1324  BP: (!) 164/78 (!) 168/83 (!) 146/78 (!) 183/84  Pulse: 99 (!) 106 68 89  Resp: (!) 24 (!) '24 20 20  '$ Temp:   98.3 F (36.8 C) 98 F (36.7 C)  TempSrc:   Oral Oral  SpO2: 93% 97% 96% 100%  Weight:      Height:        Intake/Output Summary (Last 24 hours) at 05/18/2022 1438 Last data filed at 05/18/2022 0300 Gross per 24 hour  Intake --  Output 2500 ml  Net -2500 ml      05/17/2022    5:22 PM 05/04/2022    3:33 PM 03/15/2022    8:52 PM  Last 3 Weights  Weight (lbs) 97 lb 6.4 oz 97 lb 6.4 oz 95 lb 10.9 oz  Weight (kg) 44.18 kg 44.18 kg 43.4 kg     Body mass index is 19.02 kg/m.  General:  Well nourished, well developed, in no acute distress HEENT: normal Neck: no JVD Vascular: No carotid bruits; Distal pulses 2+ bilaterally Cardiac:  tachycardic and irregular Lungs:  clear to auscultation bilaterally, no wheezing, rhonchi or rales  Abd: soft, nontender, no hepatomegaly  Ext: no edema Musculoskeletal:  No deformities, BUE and BLE strength normal and equal Skin: warm and dry  Neuro:  CNs 2-12 intact, no focal abnormalities noted Psych:  Normal affect   EKG:  The EKG was personally reviewed and demonstrates: NSR with RBBB, with and frequent PACs and T wave inversion in anteroseptal leads.   Telemetry:  not currently placed on cardiac monitoring     Relevant CV Studies:  Echo 03/15/2022   1. Left ventricular ejection fraction, by estimation, is 55 to 60%. The  left ventricle has normal function. The left ventricle demonstrates  regional wall motion abnormalities (see scoring diagram/findings for  description). There is mild concentric left  ventricular hypertrophy. Left ventricular diastolic parameters are  consistent with Grade II diastolic dysfunction (pseudonormalization).  Elevated left atrial pressure.   2. Right ventricular systolic function is mildly reduced. The right  ventricular size is normal. There is normal pulmonary artery systolic  pressure. The estimated right ventricular systolic pressure is Q000111Q mmHg.   3. Left atrial size was mildly dilated.   4. The mitral valve is myxomatous. Mild mitral valve regurgitation.   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   6. The aortic valve has been repaired/replaced. Aortic valve  regurgitation is not visualized. No aortic stenosis is present. There is a  valve present in the aortic position. Echo findings are consistent with  normal structure and function of the aortic  valve prosthesis. Aortic valve mean gradient measures 11.0 mmHg. Aortic  valve Vmax measures 2.31 m/s. Aortic valve acceleration time measures 49  msec.   7. Aortic root/ascending aorta has been repaired/replaced and dilatation  noted. 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   Comparison(s): No significant change from prior study. Prior images  reviewed side by side.   FINDINGS   Left Ventricle: Left ventricular ejection fraction, by estimation, is 55  to 60%. The left ventricle has normal function. The left ventricle  demonstrates regional wall motion abnormalities. The left ventricular  internal cavity size was normal in size.  There is mild concentric left ventricular hypertrophy. Abnormal  (paradoxical) septal motion  consistent with post-operative status. Left  ventricular diastolic parameters are consistent with Grade II diastolic  dysfunction (pseudonormalization). Elevated  left atrial pressure.   Right Ventricle: The right ventricular size is normal. No increase in  right ventricular wall  thickness. Right ventricular systolic function is  mildly reduced. There is normal pulmonary artery systolic pressure. The  tricuspid regurgitant velocity is 2.33  m/s, and with an assumed right atrial pressure of 3 mmHg, the estimated  right ventricular systolic pressure is Q000111Q mmHg.   Left Atrium: Left atrial size was mildly dilated.   Right Atrium: Right atrial size was normal in size. Prominent Chiari  network.   Pericardium: There is no evidence of pericardial effusion.   Mitral Valve: The mitral valve is myxomatous. Mild mitral valve  regurgitation.   Tricuspid Valve: The tricuspid valve is myxomatous. Tricuspid valve  regurgitation is mild to moderate. 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   Aortic Valve: The aortic valve has been repaired/replaced. Aortic valve  regurgitation is not visualized. No aortic stenosis is present. Aortic  valve mean gradient measures 11.0 mmHg. Aortic valve peak gradient  measures 21.3 mmHg. Aortic valve area, by  VTI measures 1.40 cm. There is a valve present in the aortic position.  Echo findings are consistent with normal structure and function of the  aortic valve prosthesis.   Pulmonic Valve: The pulmonic valve was grossly normal. Pulmonic valve  regurgitation is not visualized.   Aorta: The aortic root and ascending aorta are structurally normal, with  no evidence of dilitation, the aortic root/ascending aorta has been  repaired/replaced and aortic dilatation noted. There is moderate  dilatation of the descending aorta, measuring 39   mm.   IAS/Shunts: No atrial level shunt detected by color flow Doppler.   CXR 05/17/2022 No active  disease  Laboratory Data:  High Sensitivity Troponin:  No results for input(s): "TROPONINIHS" in the last 720 hours.   Chemistry Recent Labs  Lab 05/17/22 1732 05/17/22 1740  NA 138 138  K 3.4* 3.4*  CL 102 100  CO2 24  --   GLUCOSE 125* 124*  BUN 12 12  CREATININE 0.62 0.60  CALCIUM 8.8*  --   GFRNONAA >60  --   ANIONGAP 12  --     No results for input(s): "PROT", "ALBUMIN", "AST", "ALT", "ALKPHOS", "BILITOT" in the last 168 hours. Lipids No results for input(s): "CHOL", "TRIG", "HDL", "LABVLDL", "LDLCALC", "CHOLHDL" in the last 168 hours.  Hematology Recent Labs  Lab 05/17/22 1732 05/17/22 1740  WBC 6.5  --   RBC 3.58*  --   HGB 9.7* 10.9*  HCT 31.7* 32.0*  MCV 88.5  --   MCH 27.1  --   MCHC 30.6  --   RDW 13.6  --   PLT 195  --    Thyroid No results for input(s): "TSH", "FREET4" in the last 168 hours.  BNPNo results for input(s): "BNP", "PROBNP" in the last 168 hours.  DDimer No results for input(s): "DDIMER" in the last 168 hours.   Radiology/Studies:  DG FEMUR MIN 2 VIEWS LEFT  Result Date: 05/17/2022 CLINICAL DATA:  Hip fracture EXAM: LEFT FEMUR 2 VIEWS COMPARISON:  05/17/2022 FINDINGS: There is a left femoral neck fracture with varus angulation. Old left superior and inferior pubic rami fractures. No subluxation or dislocation. No additional femoral abnormality. No joint effusion in the left knee. IMPRESSION: Left femoral neck fracture with varus angulation. Electronically Signed   By: Rolm Baptise M.D.   On: 05/17/2022 19:08   DG Hip [place] or [place] Pelvis 1 View Left  Result Date: 05/17/2022 CLINICAL DATA:  Level 2 trauma.  Fall.  Left hip injury. EXAM: DG HIP (WITH OR  WITHOUT PELVIS) 1V PORT LEFT COMPARISON:  Pelvis and left hip radiographs 09/15/2016 FINDINGS: There is diffuse decreased bone mineralization. Interval healing of the previously seen minimally displaced fracture at the junction of the left superior pubic ramus and the left acetabulum in the  mid left inferior pubic ramus. There is a new transverse fracture of the proximal left femoral neck with approximately 11 mm lateral displacement and mild superior displacement of the distal fracture component with respect to the proximal fracture component. Mild-to-moderate varus angulation. The bilateral femoroacetabular joint spaces are maintained. Severe pubic symphysis joint space narrowing. Moderate atherosclerotic calcifications. IMPRESSION: Acute transverse fracture of the proximal left femoral neck with mild-to-moderate superolateral displacement of the distal fracture component and mild to moderate varus angulation. Electronically Signed   By: Yvonne Kendall M.D.   On: 05/17/2022 17:43   DG Chest Portable 1 View  Result Date: 05/17/2022 CLINICAL DATA:  Pain after fall EXAM: PORTABLE CHEST 1 VIEW COMPARISON:  Chest x-ray November 15, 2019 FINDINGS: The cardiomediastinal silhouette is stable with a markedly tortuous thoracic aorta. No pneumothorax. The lungs are clear. No acute abnormalities identified. IMPRESSION: No active disease. Electronically Signed   By: Dorise Bullion III M.D.   On: 05/17/2022 17:39     Assessment and Plan:   Nonobstructive CAD She has known mild nonobstructive disease found on cath in 2016 as part of her workup for severe AS. She has remained stable over the years without anginal symptom, and has had close follow up in clinic.   This admission, she continue to deny angina. She ambulates with assistance, exertional chest pain is difficult to determine.    Hx of mild cardiomyopathy now with improved EF Echo at the time of her AS with EF 45%. Follow up echo after SAVR with normalized EF. Subsequent follow up echos continued to show good valve function and preserved EF. Last echo performed in January of 2024 in the setting of CVA  with LVEF 55-60%, grade 2 DD, with WMA, mild MR, mid to moderate TR, good aortic valve function.  PTA GDMT: irbesartan - she does not appear  volume up on exam   HTN with tachycardia  Blood pressure remains elevated today but appears to be somewhat close to her baseline. Some elevation is to be expected with pain and tachycardia.  Appears to be a challenge to control, but is close to her baseline. Would hesitate to push her goal SBP below 140 given age, frailty, and antiplatelet regimen - Currently on diltiazem and irbesartan   Irregular rhythm, PACs - telemetry and EKG appear consistent with sinus rhythm, known RBBB, LAFB, and frequent PACs - do not see definite evidence of Afib, although she would be at increased risk for atrial arrhythmia (echo with only mild LAE)   Hyperlipidemia  -Deferring medical management at this time and wants to work on diet and exercise. - do not feel strongly about repeating labs given age, but could benefit from low dose statin for hx of stroke   CVA Hx of TIA/CVA x 3. Last CVA Jan 2024 treated with DAPT x 3 weeks, then plavix monotherapy. Unclear medication compliance. Likely discharge on plavix monotherapy, per neurology OP follow up, when safe per surgeon. Antiplatelet therapy may be altered should she have a need for [place].   Preoperative risk evaluation for MACE prior to hip surgery Pt does not have a history of ischemic heart disease or insulin requirement. She has an extensive stroke history.  She has a history of  mild cardiomyopathy prior to SAVR, but his has improved and been stable on subsequent echos. She does not appear volume up. Recent echo 03/2022 with good valve function. According to the RCRI, she has a 6.6% risk of MACE during the perioperative period. Her history of known bifascicular block may increase her likelihood of conduction disease. No indication for anticoagulation at this time. She does not have any unstable cardiac conditions. I do not think additional cardiac testing would mitigate this risk. She understands her risk and wishes to proceed.      Risk Assessment/Risk  Scores:  New York Heart Association (NYHA) Functional Class NYHA Class II      For questions or updates, please contact [place] Please consult [email or web address] for contact info under    Signed, Bonnee Quin, PA-C  05/18/2022 2:38 PM

## 2022-05-18 NOTE — Anesthesia Preprocedure Evaluation (Signed)
Anesthesia Evaluation  Patient identified by MRN, date of birth, ID band Patient awake    Reviewed: Allergy & Precautions, NPO status , Patient's Chart, lab work & pertinent test results  Airway Mallampati: II  TM Distance: >3 FB Neck ROM: Full    Dental   Pulmonary neg pulmonary ROS   Pulmonary exam normal breath sounds clear to auscultation       Cardiovascular hypertension, Pt. on medications + CAD  + dysrhythmias Atrial Fibrillation  Rhythm:Regular Rate:Abnormal  S/p Bentall   Neuro/Psych  PSYCHIATRIC DISORDERS Anxiety     CVA    GI/Hepatic Neg liver ROS, hiatal hernia,,,  Endo/Other  negative endocrine ROS    Renal/GU negative Renal ROS     Musculoskeletal negative musculoskeletal ROS (+)  Left femur fracture    Abdominal   Peds  Hematology  (+) Blood dyscrasia, anemia   Anesthesia Other Findings Day of surgery medications reviewed with the patient.  Reproductive/Obstetrics                             Anesthesia Physical Anesthesia Plan  ASA: 3  Anesthesia Plan: Consulting civil engineer Block-Lidocaine Only   Post-op Pain Management:    Induction:   PONV Risk Score and Plan: 2 and Treatment may vary due to age or medical condition  Airway Management Planned: Natural Airway  Additional Equipment:   Intra-op Plan:   Post-operative Plan:   Informed Consent: I have reviewed the patients History and Physical, chart, labs and discussed the procedure including the risks, benefits and alternatives for the proposed anesthesia with the patient or authorized representative who has indicated his/her understanding and acceptance.       Plan Discussed with: CRNA  Anesthesia Plan Comments:        Anesthesia Quick Evaluation

## 2022-05-18 NOTE — Consult Note (Signed)
Reason for Consult:Left hip fx Referring Physician: Antonieta Pert Time called: 0730 Time at bedside: [place]   Yvonne Ballard is an 82 y.o. female.  HPI: Yvonne Ballard was going to take a nap when she tripped and fell. She had immediate left hip pain and could not get up. She was brought to the ED where x-rays showed a femoral neck fx and orthopedic surgery was consulted. She lives in independent living at [facility] and ambulates with a RW or cane.  Past Medical History:  Diagnosis Date   Abnormal ECG    Anxiety    pt. 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   Stroke ([place]) 03/14/2022   Weight loss     Past Surgical History:  Procedure Laterality Date   BENTALL PROCEDURE N/A 03/31/2014   Procedure: BIO-BENTALL PROCEDURE using a 59m Magna Ease Aortic Tissue Valve, a 26 mm Terumo Gelweave Valsalva Graft, a 14x10x173mTerumo Gelweave Graft, and a 2858mtraight Hemashield Platinum Graft.;  Surgeon: PetIvin PootD;  [facility];  Laterality: N/A;   LEFT AND RIGHT HEART CATHETERIZATION WITH CORONARY ANGIOGRAM N/A 03/26/2014   Procedure: LEFT AND RIGHT HEART CATHETERIZATION WITH CORONARY ANGIOGRAM;  Surgeon: Peter M JorMartiniqueD;  [facility];  Service: Cardiovascular;  Laterality: N/A;   TEE WITHOUT CARDIOVERSION N/A 03/31/2014   Procedure: TRANSESOPHAGEAL ECHOCARDIOGRAM (TEE);  Surgeon: PetIvin PootD;  [facility];  Laterality: N/A;   TONSILLECTOMY     TUBAL LIGATION      Family  History  Problem Relation Age of Onset   Cancer Mother    Cancer Father    Stroke Father    Cancer Brother     Social History:  reports that she has never smoked. She has never used smokeless tobacco. She reports current alcohol use. She reports that she does not use drugs.  Allergies: No Known Allergies  Medications: I have reviewed the patient's current medications.  Results for orders placed or performed during the hospital encounter of 05/17/22 (from the past 48 hour(s))  CBC     Status: Abnormal   Collection Time: 05/17/22  5:32 PM  Result Value Ref Range   WBC 6.5 4.0 - 10.5 K/uL   RBC 3.58 (L) 3.87 - 5.11 MIL/uL   Hemoglobin 9.7 (L) 12.0 - 15.0 g/dL   HCT 31.7 (L) 36.0 - 46.0 %   MCV 88.5 80.0 - 100.0 fL   MCH 27.1 26.0 - 34.0 pg   MCHC 30.6 30.0 - 36.0 g/dL   RDW 13.6 11.5 - 15.5 %   Platelets 195 150 - 400 K/uL   nRBC 0.0 0.0 - 0.2 %    Comment: Performed at [id number] metabolic panel     Status: Abnormal   Collection Time: 05/17/22  5:32 PM  Result Value Ref Range   Sodium 138 135 -  145 mmol/L   Potassium 3.4 (L) 3.5 - 5.1 mmol/L   Chloride 102 98 - 111 mmol/L   CO2 24 22 - 32 mmol/L   Glucose, Bld 125 (H) 70 - 99 mg/dL    Comment: Glucose reference range applies only to samples taken after fasting for at least 8 hours.   BUN 12 8 - 23 mg/dL   Creatinine, Ser 0.62 0.44 - 1.00 mg/dL   Calcium 8.8 (L) 8.9 - 10.3 mg/dL   GFR, Estimated >60 >60 mL/min    Comment: (NOTE) Calculated using the CKD-EPI Creatinine Equation (2021)    Anion gap 12 5 - 15    Comment: Performed at [facility] [address] [place], [place] [address]  Sample to Blood Bank     Status: None   Collection Time: 05/17/22  5:32 PM  Result Value Ref Range   Blood Bank Specimen SAMPLE AVAILABLE FOR TESTING    Sample Expiration      05/20/2022,2359 Performed at [facility], [place] [address] [place], [place] [address]    I-stat chem 8, ED (not at [facility], [facility] or [facility])     Status: Abnormal   Collection Time: 05/17/22  5:40 PM  Result Value Ref Range   Sodium 138 135 - 145 mmol/L   Potassium 3.4 (L) 3.5 - 5.1 mmol/L   Chloride 100 98 - 111 mmol/L   BUN 12 8 - 23 mg/dL   Creatinine, Ser 0.60 0.44 - 1.00 mg/dL   Glucose, Bld 124 (H) 70 - 99 mg/dL    Comment: Glucose reference range applies only to samples taken after fasting for at least 8 hours.   Calcium, Ion 1.06 (L) 1.15 - 1.40 mmol/L   TCO2 26 22 - 32 mmol/L   Hemoglobin 10.9 (L) 12.0 - 15.0 g/dL   HCT 32.0 (L) 36.0 - 46.0 %    DG FEMUR MIN 2 VIEWS LEFT  Result Date: 05/17/2022 CLINICAL DATA:  Hip fracture EXAM: LEFT FEMUR 2 VIEWS COMPARISON:  05/17/2022 FINDINGS: There is a left femoral neck fracture with varus angulation. Old left superior and inferior pubic rami fractures. No subluxation or dislocation. No additional femoral abnormality. No joint effusion in the left knee. IMPRESSION: Left femoral neck fracture with varus angulation. Electronically Signed   By: Rolm Baptise M.D.   On: 05/17/2022 19:08   DG Hip [place] W or [place] Pelvis 1 View Left  Result Date: 05/17/2022 CLINICAL DATA:  Level 2 trauma.  Fall.  Left hip injury. EXAM: DG HIP (WITH OR WITHOUT PELVIS) 1V PORT LEFT COMPARISON:  Pelvis and left hip radiographs 09/15/2016 FINDINGS: There is diffuse decreased bone mineralization. Interval healing of the previously seen minimally displaced fracture at the junction of the left superior pubic ramus and the left acetabulum in the mid left inferior pubic ramus. There is a new transverse fracture of the proximal left femoral neck with approximately 11 mm lateral displacement and mild superior displacement of the distal fracture component with respect to the proximal fracture component. Mild-to-moderate varus angulation. The bilateral femoroacetabular joint spaces are maintained. Severe pubic symphysis joint space narrowing. Moderate atherosclerotic  calcifications. IMPRESSION: Acute transverse fracture of the proximal left femoral neck with mild-to-moderate superolateral displacement of the distal fracture component and mild to moderate varus angulation. Electronically Signed   By: Yvonne Kendall M.D.   On: 05/17/2022 17:43   DG Chest Portable 1 View  Result Date: 05/17/2022 CLINICAL DATA:  Pain after fall EXAM: PORTABLE  CHEST 1 VIEW COMPARISON:  Chest x-ray November 15, 2019 FINDINGS: The cardiomediastinal silhouette is stable with a markedly tortuous thoracic aorta. No pneumothorax. The lungs are clear. No acute abnormalities identified. IMPRESSION: No active disease. Electronically Signed   By: Dorise Bullion III M.D.   On: 05/17/2022 17:39    Review of Systems  HENT:  Negative for ear discharge, ear pain, hearing loss and tinnitus.   Eyes:  Negative for photophobia and pain.  Respiratory:  Negative for cough and shortness of breath.   Cardiovascular:  Negative for chest pain.  Gastrointestinal:  Negative for abdominal pain, nausea and vomiting.  Genitourinary:  Negative for dysuria, flank pain, frequency and urgency.  Musculoskeletal:  Positive for arthralgias (Left hip). Negative for back pain, myalgias and neck pain.  Neurological:  Negative for dizziness and headaches.  Hematological:  Does not bruise/bleed easily.  Psychiatric/Behavioral:  The patient is not nervous/anxious.    Blood pressure (!) 168/83, pulse (!) 106, temperature 98.4 F (36.9 C), resp. rate (!) 24, height 5' (1.524 m), weight 44.2 kg, SpO2 97 %. Physical Exam Constitutional:      General: She is not in acute distress.    Appearance: She is well-developed. She is not diaphoretic.  HENT:     Head: Normocephalic and atraumatic.  Eyes:     General: No scleral icterus.       Right eye: No discharge.        Left eye: No discharge.     Conjunctiva/sclera: Conjunctivae normal.  Cardiovascular:     Rate and Rhythm: Normal rate and regular rhythm.  Pulmonary:      Effort: Pulmonary effort is normal. No respiratory distress.  Musculoskeletal:     Cervical back: Normal range of motion.     Comments: LLE No traumatic wounds, ecchymosis, or rash  Nontender  No knee or ankle effusion  Knee stable to varus/ valgus and anterior/posterior stress  Sens DPN, SPN, TN intact  Motor EHL, ext, flex, evers 5/5  DP 2+, PT 2+, No significant edema  Skin:    General: Skin is warm and dry.  Neurological:     Mental Status: She is alert.  Psychiatric:        Mood and Affect: Mood normal.        Behavior: Behavior normal.    Assessment/Plan: Left hip fx -- Plan THA tomorrow with Dr. Mardelle Matte. Please keep NPO after MN. Multiple medical problems including HTN, CAD, s/p CVA 03/14/2022, PMR, and hyperlipidemia -- per primary service    [name], PA-C Orthopedic Surgery [phone] 05/18/2022, 10:39 AM

## 2022-05-18 NOTE — Anesthesia Procedure Notes (Signed)
Anesthesia Regional Block: Femoral nerve block   Pre-Anesthetic Checklist: , timeout performed,  Correct Patient, Correct Site, Correct Laterality,  Correct Procedure, Correct Position, site marked,  Risks and benefits discussed,  Surgical consent,  Pre-op evaluation,  At surgeon's request and post-op pain management  Laterality: Left  Prep: chloraprep       Needles:  Injection technique: Single-shot  Needle Type: Echogenic Stimulator Needle     Needle Length: 9cm  Needle Gauge: 21     Additional Needles:   Narrative:  Start time: 05/18/2022 9:50 AM End time: 05/18/2022 9:56 AM Injection made incrementally with aspirations every 5 mL.  Performed by: Personally  Anesthesiologist: Santa Lighter, MD  Additional Notes: Pt. tolerated procedure well. Good perineural spread visualized on [place].

## 2022-05-18 NOTE — Hospital Course (Addendum)
82 year old female history of stroke in January 2024, hypertension, hyperlipidemia, CAD, PMR admitted after mechanical fall.. Pt lives in ALF([facility]). Pt states she was walking with her walker to her bedroom to take a nap. Lost her balance and fell onto her left side.  In the ED uncontrolled hypertension 210/80 afebrile heart rate variable 55-102, on room air.  Labs with mild hypokalemia normocytic anemia 9.7.  Imaging chest x-ray unremarkable hip x-ray showed acute transverse fracture of the proximal left femoral neck with mild to moderate superolateral displacement of the distal fracture component and valgus angulation.  EKG showed NSR with PACs on admission review. Orthopedic surgery consulted and admitted.  Seen by cardiology for preop evaluation.S/p left hip hemiarthroplasty by Dr Mardelle Matte 05/19/22. Post op anemia hemoglobin down to 7 g given 1 PRBC improved to 8.7, SNF has been approved has been cleared for discharge by orthopedic

## 2022-05-18 NOTE — Progress Notes (Signed)
Patient was being prepped for nerve block and became nauseated. Patient vomited and briefly went into Afib with rate in the 140's. MD at bedside, pt converted back to sinus tach.

## 2022-05-18 NOTE — TOC Initial Note (Signed)
.Transition of Care [facility]) - Initial/Assessment Note    Patient Details  Name: Yvonne Ballard MRN: [id number] Date of Birth: 1940/08/21  [facility] CM/SW Contact:    Ninfa Meeker, RN Phone Number: 05/18/2022, 9:17 AM  Clinical Narrative:                  Transition of Care [facility]) Department has reviewed patient and no TOC needs have been identified at this time. We will continue to monitor patient advancement through Interdisciplinary progressions and if new patient needs arise, please place a consult       Patient Goals and CMS Choice            Expected Discharge Plan and Services                                              Prior Living Arrangements/Services                       Activities of Daily Living Home Assistive Devices/Equipment: Cane (specify quad or straight), Eyeglasses, Walker (specify type) ADL Screening (condition at time of admission) Patient's cognitive ability adequate to safely complete daily activities?: Yes Is the patient deaf or have difficulty hearing?: No Does the patient have difficulty seeing, even when wearing glasses/contacts?: No Does the patient have difficulty concentrating, remembering, or making decisions?: No Patient able to express need for assistance with ADLs?: Yes Does the patient have difficulty dressing or bathing?: No Independently performs ADLs?: Yes (appropriate for developmental age) Does the patient have difficulty walking or climbing stairs?: Yes Weakness of Legs: Left Weakness of Arms/Hands: Left  Permission Sought/Granted                  Emotional Assessment              Admission diagnosis:  Closed fracture of left hip, initial encounter ([place]) [S72.002A] Closed displaced fracture of left femoral neck ([place]) [S72.002A] Patient Active Problem List   Diagnosis Date Noted   Closed displaced fracture of left femoral neck ([place]) 05/17/2022   History of CVA  (cerebrovascular accident)- 03/14/2022 05/17/2022   DNR (do not resuscitate)/DNI(Do Not Intubate) 05/17/2022   Hypokalemia 05/17/2022   CRAO (central retinal artery occlusion), right 11/15/2019   Weight loss    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[place])    Hypertension    History of hiatal hernia    Family history of adverse reaction to anesthesia    Ascending aortic aneurysm ([place])    Anxiety disorder    Abnormal ECG    Anxiety 02/07/2017   Hiatal hernia 02/07/2017   CAD (coronary artery disease), native coronary artery 07/26/2014   Dyslipidemia, goal LDL below 70 07/26/2014   S/P AVR (aortic valve replacement)    Aortic aneurysm, 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   Benign essential HTN 03/18/2014   LVH (left ventricular hypertrophy) due to hypertensive disease 03/18/2014   Heart murmur 03/18/2014   Coronary artery disease 03/14/2014   PCP:  [name], MD Pharmacy:   [facility], [address] [place] [address] Phone: [phone] Fax: [phone]  [facility] [place], [place] AT [facility] [address] [place] [address] Phone: [phone] Fax: [phone]  Social Determinants of Health (SDOH) Social History: SDOH Screenings   Food Insecurity: No Food Insecurity (05/17/2022)  Housing: Low Risk  (05/17/2022)  Transportation Needs: No Transportation Needs (05/17/2022)  Utilities: Not At Risk (05/17/2022)  Tobacco Use: Low Risk  (05/18/2022)   SDOH Interventions:     Readmission Risk Interventions     No data to display

## 2022-05-18 NOTE — Progress Notes (Signed)
Initial Nutrition Assessment  DOCUMENTATION CODES:   Not applicable  INTERVENTION:  Liberalize to regular diet Ensure Enlive po BID, each supplement provides 350 kcal and 20 grams of protein. MVI with minerals daily  NUTRITION DIAGNOSIS:   Increased nutrient needs related to hip fracture as evidenced by estimated needs.  GOAL:   Patient will meet greater than or equal to 90% of their needs  MONITOR:   PO intake, Labs, Weight trends  REASON FOR ASSESSMENT:   Consult Hip fracture protocol  ASSESSMENT:   Pt admitted from ALF after a fall leading to L femoral neck fracture. PMH significant for HTN, CAD s/p CVA (03/14/22), PMR, HLD.  Noted plans for THA tomorrow.  Pt with recurrent episodes of emesis at first attempt to assess. Pt sleeping upon second attempt. No family present at bedside. RN had provided anti-emetic.   Unable to obtain detailed nutrition related history at this time. No documented meal completions on file to review.   Reviewed weight history. Pt's weight noted to be stable within the last year between 43-44 kg.   Medications: reviewed   Labs (03/05): potassium 3.4, ionized Ca 1.06   NUTRITION - FOCUSED PHYSICAL EXAM: Deferred to follow up.   Diet Order:   Diet Order             Diet NPO time specified Except for: Sips with Meds  Diet effective midnight           Diet Heart Room service appropriate? No; Fluid consistency: Thin  Diet effective now                   EDUCATION NEEDS:   No education needs have been identified at this time  Skin:  Skin Assessment: Reviewed RN Assessment  Last BM:  3/5  Height:   Ht Readings from Last 1 Encounters:  05/17/22 5' (1.524 m)    Weight:   Wt Readings from Last 1 Encounters:  05/17/22 44.2 kg   BMI:  Body mass index is 19.02 kg/m.  Estimated Nutritional Needs:   Kcal:  1200-1400  Protein:  60-75g  Fluid:  1.2-1.4L  Clayborne Dana, RDN, LDN Clinical Nutrition

## 2022-05-18 NOTE — Progress Notes (Signed)
PROGRESS NOTE Yvonne Ballard  [id number] DOB: September 07, 1940 DOA: 05/17/2022 PCP: [name], MD  Brief Narrative/Hospital Course: 82 year old female history of stroke in January 2024, hypertension, hyperlipidemia, CAD, PMR admitted after mechanical fall.. Pt lives in ALF([facility]). Pt states she was walking with her walker to her bedroom to take a nap. Lost her balance and fell onto her left side.   In the ED uncontrolled hypertension 210/80 afebrile heart rate variable 55-102, on room air.  Labs with mild hypokalemia normocytic anemia 9.7.  Imaging chest x-ray unremarkable hip x-ray showed acute transverse fracture of the proximal left femoral neck with mild to moderate superolateral displacement of the distal fracture component and valgus angulation.  EKG showed NSR with PACs on admission review. Orthopedic surgery consulted and admitted     Subjective: Seen and examined this morning.  Complains of pain on the left hip no chest pain shortness of breath. Having headache and also started vomiting after his insulin Has been tachycardic overnight blood pressure improving, afebrile  Assessment and Plan: Principal Problem:   Closed displaced fracture of left femoral neck (HCC) Active Problems:   Benign essential HTN   CAD (coronary artery disease), native coronary artery   Dyslipidemia, goal LDL below 70   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HCC)   History of CVA (cerebrovascular accident)- 03/14/2022   DNR (do not resuscitate)/DNI(Do Not Intubate)   Hypokalemia    Closed displaced fracture of left femoral neck secondary to fall: Orthopedic surgery has been consulted, patient kept n.p.o. for operative intervention, holding aspirin Plavix. Patient going for intermediate risk surgery w/ risk  fac tors of recent CVA, hx of  CAD, CHF, echo from January/2/24 EF 34 to 60% with RWMA, G2 DD, with replaced/repaired aortic valve normal structure and function of the aortic valve prosthesis: I  consultED with cardiology for preop eval. Defer pain management/DVT prophylaxis postop to orthopedics. Per Legrand Como from Orthopedic surgery will not be today- start diet n.p.o. past midnight  Recent CVA  03/14/2022: MRI 03/14/22 showed 9 mm acute infarct of the posterior limb of right internal capsule with remote infarct of the posterior right lentiform nucleus extending superiorly to the corona radiata, extensive periventricular subcortical white matter disease. Patient on asa and plavix: Holding for surgery, resume ONLY PLAVIX postop once cleared by Ortho-based upon discharge summary 1/5 patient was recommended to continue aspirin Plavix x 3 weeks followed by Plavix alone.Of note patient had refused statin.  Nausea vomiting continue antiemetics, gentle IV fluids, add PPI  [id number]. Dyslipidemia, goal LDL below 70: Patient had refused statin on her last discharge, see urinary dry diet before considering medication   [id number]. 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   GOC :DNR Verified with patient on admission   DVT prophylaxis: SCDs Start: 05/17/22 2139 Code Status:   Code Status: DNR Family Communication: plan of care discussed with patient at bedside. Patient status is: Inpatient because of hip fracture Level of care: Telemetry Cardiac   Dispo: The patient is from:  ALF            Anticipated disposition: SNF TBD 2-3 DAYS Objective: Vitals last 24 hrs: Vitals:   05/18/22  0955 05/18/22 1000 05/18/22 1005 05/18/22 1010  BP: (!) 174/140  (!) 164/78 (!) 168/83  Pulse: (!) 115 (!) 113 99 (!) 106  Resp: (!) 30 (!) 24 (!) 24 (!) 24  Temp:      TempSrc:      SpO2: 90% 90% 93% 97%  Weight:      Height:       Weight change:   Physical Examination: General exam: alert awake, ill looking frail older than stated age HEENT:Oral mucosa moist, Ear/Nose WNL grossly Respiratory system: bilaterally diminished BS, no use of accessory muscle Cardiovascular system: S1 & S2 +, No JVD. Gastrointestinal system: Abdomen soft,NT,ND, BS+ Nervous System:Alert, awake, moving extremities.  Speaks in SOFT MUFFLED VOICE Extremities: LE edema NEG, tender left hip with externally rotated,distal peripheral pulses palpable.  Skin: No rashes,no icterus. MSK: Normal muscle bulk,tone, power  Medications reviewed:  Scheduled Meds:  diltiazem  120 mg Oral BID   irbesartan  75 mg Oral BID   ondansetron       Continuous Infusions:  lactated ringers 75 mL/hr at 05/17/22 2351      Diet Order             Diet NPO time specified  Diet effective midnight           Diet Heart Room service appropriate? Yes; Fluid consistency: Thin  Diet effective now                   Intake/Output Summary (Last 24 hours) at 05/18/2022 1108 Last data filed at 05/18/2022 0300 Gross per 24 hour  Intake --  Output 2500 ml  Net -2500 ml   Net IO Since Admission: -2,500 mL [05/18/22 1108]  Wt Readings from Last 3 Encounters:  05/17/22 44.2 kg  05/04/22 44.2 kg  03/15/22 43.4 kg     Unresulted Labs (From admission, onward)     Start     Ordered   05/19/22 0500  CBC  Daily,   R      05/18/22 1108   05/19/22 [id number]  Basic metabolic panel  Daily,   R      05/18/22 1108          Data Reviewed: I have personally reviewed following labs and imaging studies CBC: Recent Labs  Lab 05/17/22 1732 05/17/22 1740  WBC 6.5  --   HGB 9.7* 10.9*  HCT 31.7* 32.0*  MCV 88.5  --   PLT 195  --     Basic Metabolic Panel: Recent Labs  Lab 05/17/22 1732 05/17/22 1740  NA 138 138  K 3.4* 3.4*  CL 102 100  CO2 24  --   GLUCOSE 125* 124*  BUN 12 12  CREATININE 0.62 0.60  CALCIUM 8.8*  --    No results found for this or any previous visit (from the past 240 hour(s)).  Antimicrobials: Anti-infectives (From admission, onward)    None      Culture/Microbiology    Component Value Date/Time   SDES URINE, CLEAN CATCH 04/19/2014 0610   SPECREQUEST NONE 04/19/2014 0610   CULT  04/19/2014 0610    Multiple bacterial morphotypes present, none predominant. Suggest appropriate recollection if clinically indicated. Performed at [place] 04/21/2014 FINAL 04/19/2014 0610    Radiology Studies: DG FEMUR MIN 2 VIEWS LEFT  Result Date: 05/17/2022 CLINICAL DATA:  Hip fracture EXAM: LEFT FEMUR 2 VIEWS COMPARISON:  05/17/2022 FINDINGS: There is a left femoral neck fracture with varus angulation. Old left superior and inferior pubic rami fractures. No subluxation or dislocation. No additional femoral abnormality. No joint effusion in the left knee. IMPRESSION: Left femoral neck fracture with varus angulation. Electronically Signed   By: Rolm Baptise M.D.   On: 05/17/2022 19:08   DG Hip [employer] [place] or [place] Pelvis 1 View Left  Result Date: 05/17/2022 CLINICAL DATA:  Level 2 trauma.  Fall.  Left hip injury. EXAM: DG HIP (WITH OR WITHOUT PELVIS) 1V PORT LEFT COMPARISON:  Pelvis and left hip radiographs 09/15/2016 FINDINGS: There is diffuse decreased bone mineralization. Interval healing of the previously seen minimally displaced fracture at the junction of the left superior pubic ramus and the left acetabulum in the mid left inferior pubic ramus. There is a new transverse fracture of the proximal left femoral neck with approximately 11 mm lateral displacement and mild superior displacement of the distal fracture component with respect to the proximal fracture component.  Mild-to-moderate varus angulation. The bilateral femoroacetabular joint spaces are maintained. Severe pubic symphysis joint space narrowing. Moderate atherosclerotic calcifications. IMPRESSION: Acute transverse fracture of the proximal left femoral neck with mild-to-moderate superolateral displacement of the distal fracture component and mild to moderate varus angulation. Electronically Signed   By: Yvonne Kendall M.D.   On: 05/17/2022 17:43   DG Chest Portable 1 View  Result Date: 05/17/2022 CLINICAL DATA:  Pain after fall EXAM: PORTABLE CHEST 1 VIEW COMPARISON:  Chest x-ray November 15, 2019 FINDINGS: The cardiomediastinal silhouette is stable with a markedly tortuous thoracic aorta. No pneumothorax. The lungs are clear. No acute abnormalities identified. IMPRESSION: No active disease. Electronically Signed   By: Dorise Bullion III M.D.   On: 05/17/2022 17:39     LOS: 1 day   Antonieta Pert, MD [facility]  05/18/2022, 11:08 AM

## 2022-05-18 NOTE — Progress Notes (Signed)
Ortho input requested, I will plan for left hip hemiarthroplasty tomorrow early afternoon.  I have tried to reach the patient and family by phone, but unsuccessful so far.  Full discussion to follow.      Johnny Bridge, MD

## 2022-05-19 ENCOUNTER — Inpatient Hospital Stay (HOSPITAL_COMMUNITY): Payer: Medicare Other | Admitting: Certified Registered"

## 2022-05-19 ENCOUNTER — Encounter (HOSPITAL_COMMUNITY)
Admission: EM | Disposition: A | Discharge status: Skilled Nursing Facility | Payer: Self-pay | Source: Home / Self Care | Attending: Internal Medicine

## 2022-05-19 ENCOUNTER — Inpatient Hospital Stay (HOSPITAL_COMMUNITY): Payer: Medicare Other

## 2022-05-19 ENCOUNTER — Encounter (HOSPITAL_COMMUNITY): Payer: Self-pay | Admitting: Internal Medicine

## 2022-05-19 ENCOUNTER — Other Ambulatory Visit: Payer: Self-pay

## 2022-05-19 DIAGNOSIS — I10 Essential (primary) hypertension: Secondary | ICD-10-CM

## 2022-05-19 DIAGNOSIS — I4891 Unspecified atrial fibrillation: Secondary | ICD-10-CM | POA: Diagnosis not present

## 2022-05-19 DIAGNOSIS — S72002A Fracture of unspecified part of neck of left femur, initial encounter for closed fracture: Secondary | ICD-10-CM

## 2022-05-19 DIAGNOSIS — D649 Anemia, unspecified: Secondary | ICD-10-CM

## 2022-05-19 HISTORY — PX: HIP ARTHROPLASTY: SHX981

## 2022-05-19 LAB — CBC
HCT: 31.4 % — ABNORMAL LOW (ref 36.0–46.0)
HCT: 29.4 % — ABNORMAL LOW (ref 36.0–46.0)
Hemoglobin: 9.9 g/dL — ABNORMAL LOW (ref 12.0–15.0)
Hemoglobin: 9.4 g/dL — ABNORMAL LOW (ref 12.0–15.0)
MCH: 27 pg (ref 26.0–34.0)
MCH: 27.6 pg (ref 26.0–34.0)
MCHC: 31.5 g/dL (ref 30.0–36.0)
MCHC: 32 g/dL (ref 30.0–36.0)
MCV: 85.6 fL (ref 80.0–100.0)
MCV: 86.2 fL (ref 80.0–100.0)
Platelets: 227 10*3/uL (ref 150–400)
Platelets: 194 10*3/uL (ref 150–400)
RBC: 3.67 MIL/uL — ABNORMAL LOW (ref 3.87–5.11)
RBC: 3.41 MIL/uL — ABNORMAL LOW (ref 3.87–5.11)
RDW: 14.3 % (ref 11.5–15.5)
RDW: 14.4 % (ref 11.5–15.5)
WBC: 9.4 10*3/uL (ref 4.0–10.5)
WBC: 14.2 10*3/uL — ABNORMAL HIGH (ref 4.0–10.5)
nRBC: 0 % (ref 0.0–0.2)
nRBC: 0 % (ref 0.0–0.2)

## 2022-05-19 LAB — BASIC METABOLIC PANEL
Anion gap: 11 (ref 5–15)
BUN: 19 mg/dL (ref 8–23)
CO2: 25 mmol/L (ref 22–32)
Calcium: 9.1 mg/dL (ref 8.9–10.3)
Chloride: 99 mmol/L (ref 98–111)
Creatinine, Ser: 0.76 mg/dL (ref 0.44–1.00)
GFR, Estimated: >60 mL/min (ref >60)
Glucose, Bld: 133 mg/dL — ABNORMAL HIGH (ref 70–99)
Potassium: 3.8 mmol/L (ref 3.5–5.1)
Sodium: 135 mmol/L (ref 135–145)

## 2022-05-19 LAB — GLUCOSE, CAPILLARY: Glucose-Capillary: 116 mg/dL — ABNORMAL HIGH (ref 70–99)

## 2022-05-19 LAB — CREATININE, SERUM
Creatinine, Ser: 0.7 mg/dL (ref 0.44–1.00)
GFR, Estimated: >60 mL/min (ref >60)

## 2022-05-19 SURGERY — HEMIARTHROPLASTY, HIP, DIRECT ANTERIOR APPROACH, FOR FRACTURE
Anesthesia: Spinal | Site: Hip | Laterality: Left

## 2022-05-19 MED ORDER — ACETAMINOPHEN 325 MG PO TABS: 325.0000 mg | ORAL_TABLET | Freq: Four times a day (QID) | ORAL | Status: DC | PRN

## 2022-05-19 MED ORDER — PHENYLEPHRINE 80 MCG/ML (10ML) SYRINGE FOR IV PUSH (FOR BLOOD PRESSURE SUPPORT)
PREFILLED_SYRINGE | INTRAVENOUS | Status: DC | PRN
  Administered 2022-05-19 (×2): 160 ug via INTRAVENOUS

## 2022-05-19 MED ORDER — SENNA 8.6 MG PO TABS
1.0000 | ORAL_TABLET | Freq: Two times a day (BID) | ORAL | Status: DC
  Administered 2022-05-19 – 2022-05-20 (×2): 8.6 mg via ORAL
  Filled 2022-05-19 (×5): qty 1

## 2022-05-19 MED ORDER — CEFAZOLIN SODIUM-DEXTROSE 2-4 GM/100ML-% IV SOLN
2.0000 g | Freq: Four times a day (QID) | INTRAVENOUS | Status: AC
  Administered 2022-05-19 – 2022-05-20 (×2): 2 g via INTRAVENOUS
  Filled 2022-05-19 (×2): qty 100

## 2022-05-19 MED ORDER — LACTATED RINGERS IV SOLN: INTRAVENOUS | Status: DC

## 2022-05-19 MED ORDER — ONDANSETRON HCL 4 MG/2ML IJ SOLN
INTRAMUSCULAR | Status: DC | PRN
  Administered 2022-05-19: 4 mg via INTRAVENOUS

## 2022-05-19 MED ORDER — ALUM & MAG HYDROXIDE-SIMETH 200-200-20 MG/5ML PO SUSP: 30.0000 mL | ORAL | Status: DC | PRN

## 2022-05-19 MED ORDER — CHLORHEXIDINE GLUCONATE 4 % EX LIQD: 60.0000 mL | Freq: Once | CUTANEOUS | Status: DC

## 2022-05-19 MED ORDER — HYDROCODONE-ACETAMINOPHEN 5-325 MG PO TABS: 1.0000 | ORAL_TABLET | Freq: Four times a day (QID) | ORAL | Status: DC | PRN

## 2022-05-19 MED ORDER — ACETAMINOPHEN 10 MG/ML IV SOLN
INTRAVENOUS | Status: DC | PRN
  Administered 2022-05-19: 660 mg via INTRAVENOUS

## 2022-05-19 MED ORDER — CHLORHEXIDINE GLUCONATE 0.12 % MT SOLN
OROMUCOSAL | Status: AC
  Administered 2022-05-19: 15 mL via OROMUCOSAL
  Filled 2022-05-19: qty 15

## 2022-05-19 MED ORDER — PHENOL 1.4 % MT LIQD: 1.0000 | OROMUCOSAL | Status: DC | PRN

## 2022-05-19 MED ORDER — TRANEXAMIC ACID-NACL 1000-0.7 MG/100ML-% IV SOLN
1000.0000 mg | INTRAVENOUS | Status: DC
  Filled 2022-05-19: qty 100

## 2022-05-19 MED ORDER — PROPOFOL 10 MG/ML IV BOLUS
INTRAVENOUS | Status: DC | PRN
  Administered 2022-05-19: 15 mg via INTRAVENOUS

## 2022-05-19 MED ORDER — FERROUS SULFATE 325 (65 FE) MG PO TABS
325.0000 mg | ORAL_TABLET | Freq: Three times a day (TID) | ORAL | Status: DC
  Administered 2022-05-19 – 2022-05-21 (×5): 325 mg via ORAL
  Filled 2022-05-19 (×8): qty 1

## 2022-05-19 MED ORDER — MUPIROCIN 2 % EX OINT
1.0000 | TOPICAL_OINTMENT | Freq: Two times a day (BID) | CUTANEOUS | Status: AC
  Administered 2022-05-19 – 2022-05-22 (×8): 1 via NASAL
  Filled 2022-05-19 (×2): qty 22

## 2022-05-19 MED ORDER — 0.9 % SODIUM CHLORIDE (POUR BTL) OPTIME
TOPICAL | Status: DC | PRN
  Administered 2022-05-19: 1000 mL

## 2022-05-19 MED ORDER — LIDOCAINE 2% (20 MG/ML) 5 ML SYRINGE
INTRAMUSCULAR | Status: DC | PRN
  Administered 2022-05-19: 20 mg via INTRAVENOUS

## 2022-05-19 MED ORDER — POTASSIUM CHLORIDE IN NACL 20-0.9 MEQ/L-% IV SOLN
INTRAVENOUS | Status: DC
  Filled 2022-05-19: qty 1000

## 2022-05-19 MED ORDER — MORPHINE SULFATE (PF) 2 MG/ML IV SOLN: 0.5000 mg | INTRAVENOUS | Status: DC | PRN

## 2022-05-19 MED ORDER — PROPOFOL 1000 MG/100ML IV EMUL
INTRAVENOUS | Status: AC
  Filled 2022-05-19: qty 100

## 2022-05-19 MED ORDER — ORAL CARE MOUTH RINSE: 15.0000 mL | Freq: Once | OROMUCOSAL | Status: AC

## 2022-05-19 MED ORDER — BISACODYL 10 MG RE SUPP: 10.0000 mg | Freq: Every day | RECTAL | Status: DC | PRN

## 2022-05-19 MED ORDER — FENTANYL CITRATE (PF) 250 MCG/5ML IJ SOLN
INTRAMUSCULAR | Status: DC | PRN
  Administered 2022-05-19: 25 ug via INTRAVENOUS

## 2022-05-19 MED ORDER — BUPIVACAINE HCL (PF) 0.25 % IJ SOLN
INTRAMUSCULAR | Status: AC
  Filled 2022-05-19: qty 30

## 2022-05-19 MED ORDER — TRAMADOL HCL 50 MG PO TABS: 50.0000 mg | ORAL_TABLET | Freq: Four times a day (QID) | ORAL | Status: DC | PRN

## 2022-05-19 MED ORDER — DEXAMETHASONE SODIUM PHOSPHATE 10 MG/ML IJ SOLN
INTRAMUSCULAR | Status: DC | PRN
  Administered 2022-05-19: 5 mg via INTRAVENOUS

## 2022-05-19 MED ORDER — CEFAZOLIN SODIUM-DEXTROSE 2-4 GM/100ML-% IV SOLN
2.0000 g | INTRAVENOUS | Status: AC
  Administered 2022-05-19: 2 g via INTRAVENOUS
  Filled 2022-05-19: qty 100

## 2022-05-19 MED ORDER — ENOXAPARIN SODIUM 40 MG/0.4ML IJ SOSY: 40.0000 mg | PREFILLED_SYRINGE | INTRAMUSCULAR | Status: DC

## 2022-05-19 MED ORDER — CHLORHEXIDINE GLUCONATE 0.12 % MT SOLN: 15.0000 mL | Freq: Once | OROMUCOSAL | Status: AC

## 2022-05-19 MED ORDER — CHLORHEXIDINE GLUCONATE CLOTH 2 % EX PADS
6.0000 | MEDICATED_PAD | Freq: Every day | CUTANEOUS | Status: AC
  Administered 2022-05-19 – 2022-05-21 (×3): 6 via TOPICAL

## 2022-05-19 MED ORDER — DOCUSATE SODIUM 100 MG PO CAPS
100.0000 mg | ORAL_CAPSULE | Freq: Two times a day (BID) | ORAL | Status: DC
  Administered 2022-05-19 – 2022-05-21 (×4): 100 mg via ORAL
  Filled 2022-05-19 (×7): qty 1

## 2022-05-19 MED ORDER — KETAMINE HCL 50 MG/5ML IJ SOSY
PREFILLED_SYRINGE | INTRAMUSCULAR | Status: AC
  Filled 2022-05-19: qty 5

## 2022-05-19 MED ORDER — MENTHOL 3 MG MT LOZG: 1.0000 | LOZENGE | OROMUCOSAL | Status: DC | PRN

## 2022-05-19 MED ORDER — POLYETHYLENE GLYCOL 3350 17 G PO PACK: 17.0000 g | PACK | Freq: Every day | ORAL | Status: DC | PRN

## 2022-05-19 MED ORDER — ONDANSETRON HCL 4 MG/2ML IJ SOLN
INTRAMUSCULAR | Status: AC
  Filled 2022-05-19: qty 2

## 2022-05-19 MED ORDER — KETAMINE HCL 10 MG/ML IJ SOLN
INTRAMUSCULAR | Status: DC | PRN
  Administered 2022-05-19: 10 mg via INTRAVENOUS

## 2022-05-19 MED ORDER — PROPOFOL 500 MG/50ML IV EMUL
INTRAVENOUS | Status: DC | PRN
  Administered 2022-05-19: 40 ug/kg/min via INTRAVENOUS

## 2022-05-19 MED ORDER — ONDANSETRON HCL 4 MG/2ML IJ SOLN: 4.0000 mg | Freq: Four times a day (QID) | INTRAMUSCULAR | Status: DC | PRN

## 2022-05-19 MED ORDER — MAGNESIUM CITRATE PO SOLN: 1.0000 | Freq: Once | ORAL | Status: DC | PRN

## 2022-05-19 MED ORDER — PHENYLEPHRINE 80 MCG/ML (10ML) SYRINGE FOR IV PUSH (FOR BLOOD PRESSURE SUPPORT)
PREFILLED_SYRINGE | INTRAVENOUS | Status: AC
  Filled 2022-05-19: qty 20

## 2022-05-19 MED ORDER — POVIDONE-IODINE 10 % EX SWAB
2.0000 | Freq: Once | CUTANEOUS | Status: AC
  Administered 2022-05-19: 2 via TOPICAL

## 2022-05-19 MED ORDER — FENTANYL CITRATE (PF) 100 MCG/2ML IJ SOLN
INTRAMUSCULAR | Status: AC
  Filled 2022-05-19: qty 2

## 2022-05-19 MED ORDER — BUPIVACAINE IN DEXTROSE 0.75-8.25 % IT SOLN
INTRATHECAL | Status: DC | PRN
  Administered 2022-05-19: 12 mg via INTRATHECAL

## 2022-05-19 MED ORDER — PHENYLEPHRINE HCL-NACL 20-0.9 MG/250ML-% IV SOLN
INTRAVENOUS | Status: DC | PRN
  Administered 2022-05-19: 30 ug/min via INTRAVENOUS

## 2022-05-19 MED ORDER — DEXAMETHASONE SODIUM PHOSPHATE 10 MG/ML IJ SOLN
INTRAMUSCULAR | Status: AC
  Filled 2022-05-19: qty 1

## 2022-05-19 MED ORDER — ACETAMINOPHEN 500 MG PO TABS
500.0000 mg | ORAL_TABLET | Freq: Four times a day (QID) | ORAL | Status: AC
  Administered 2022-05-20 (×3): 500 mg via ORAL
  Filled 2022-05-19 (×3): qty 1

## 2022-05-19 MED ORDER — FAMOTIDINE IN NACL 20-0.9 MG/50ML-% IV SOLN
20.0000 mg | Freq: Every day | INTRAVENOUS | Status: DC
  Administered 2022-05-19 – 2022-05-21 (×2): 20 mg via INTRAVENOUS
  Filled 2022-05-19 (×4): qty 50

## 2022-05-19 MED ORDER — ONDANSETRON HCL 4 MG PO TABS: 4.0000 mg | ORAL_TABLET | Freq: Four times a day (QID) | ORAL | Status: DC | PRN

## 2022-05-19 SURGICAL SUPPLY — 70 items
ADH SKN CLS APL DERMABOND .7 (GAUZE/BANDAGES/DRESSINGS) ×1
APL SKNCLS STERI-STRIP NONHPOA (GAUZE/BANDAGES/DRESSINGS) ×1
BAG COUNTER SPONGE SURGICOUNT (BAG) ×2 IMPLANT
BAG SPNG CNTER NS LX DISP (BAG) ×1
BENZOIN TINCTURE PRP APPL 2/3 (GAUZE/BANDAGES/DRESSINGS) ×2 IMPLANT
BLADE SAW SGTL 18.5X63.X.64 HD (BLADE) ×2 IMPLANT
BRUSH FEMORAL CANAL (MISCELLANEOUS) IMPLANT
CLSR STERI-STRIP ANTIMIC 1/2X4 (GAUZE/BANDAGES/DRESSINGS) ×2 IMPLANT
COVER BACK TABLE 24X17X13 BIG (DRAPES) IMPLANT
COVER SURGICAL LIGHT HANDLE (MISCELLANEOUS) ×2 IMPLANT
DERMABOND ADVANCED .7 DNX12 (GAUZE/BANDAGES/DRESSINGS) IMPLANT
DRAPE IMP U-DRAPE 54X76 (DRAPES) ×2 IMPLANT
DRAPE INCISE IOBAN 66X45 STRL (DRAPES) IMPLANT
DRAPE ORTHO SPLIT 77X108 STRL (DRAPES) ×2
DRAPE SURG ORHT 6 SPLT 77X108 (DRAPES) ×4 IMPLANT
DRAPE U-SHAPE 47X51 STRL (DRAPES) ×2 IMPLANT
DRSG MEPILEX BORDER 4X12 (GAUZE/BANDAGES/DRESSINGS) IMPLANT
DRSG MEPILEX BORDER 4X8 (GAUZE/BANDAGES/DRESSINGS) IMPLANT
DRSG MEPILEX POST OP 4X12 (GAUZE/BANDAGES/DRESSINGS) IMPLANT
DRSG MEPILEX POST OP 4X8 (GAUZE/BANDAGES/DRESSINGS) IMPLANT
DURAPREP 26ML APPLICATOR (WOUND CARE) ×2 IMPLANT
ELECT BLADE 6.5 EXT (BLADE) IMPLANT
ELECT CAUTERY BLADE 6.4 (BLADE) ×2 IMPLANT
ELECT REM PT RETURN 9FT ADLT (ELECTROSURGICAL) ×1
ELECTRODE REM PT RTRN 9FT ADLT (ELECTROSURGICAL) ×2 IMPLANT
FACESHIELD WRAPAROUND (MASK) ×2 IMPLANT
FACESHIELD WRAPAROUND OR TEAM (MASK) ×4 IMPLANT
GAUZE SPONGE 4X4 12PLY STRL (GAUZE/BANDAGES/DRESSINGS) IMPLANT
GLOVE BIO SURGEON STRL SZ7 (GLOVE) ×2 IMPLANT
GLOVE BIOGEL PI IND STRL 7.0 (GLOVE) ×2 IMPLANT
GLOVE ORTHO TXT STRL SZ7.5 (GLOVE) ×2 IMPLANT
GOWN STRL REUS W/ TWL XL LVL3 (GOWN DISPOSABLE) ×2 IMPLANT
GOWN STRL REUS W/TWL 2XL LVL3 (GOWN DISPOSABLE) ×2 IMPLANT
GOWN STRL REUS W/TWL XL LVL3 (GOWN DISPOSABLE) ×1
HANDPIECE INTERPULSE COAX TIP (DISPOSABLE)
HEAD FEM UNIPOLAR 44 OD STRL (Hips) IMPLANT
KIT BASIN OR (CUSTOM PROCEDURE TRAY) ×2 IMPLANT
KIT TURNOVER KIT B (KITS) ×2 IMPLANT
MANIFOLD NEPTUNE II (INSTRUMENTS) ×2 IMPLANT
NDL 22X1.5 STRL (OR ONLY) (MISCELLANEOUS) ×2 IMPLANT
NDL SUT 6 .5 CRC .975X.05 MAYO (NEEDLE) IMPLANT
NEEDLE 22X1.5 STRL (OR ONLY) (MISCELLANEOUS) ×1 IMPLANT
NEEDLE MAYO TAPER (NEEDLE)
NS IRRIG 1000ML POUR BTL (IV SOLUTION) ×2 IMPLANT
PACK TOTAL JOINT (CUSTOM PROCEDURE TRAY) ×2 IMPLANT
PACK UNIVERSAL I (CUSTOM PROCEDURE TRAY) ×2 IMPLANT
PAD ARMBOARD 7.5X6 YLW CONV (MISCELLANEOUS) ×4 IMPLANT
PILLOW ABDUCTION MEDIUM (MISCELLANEOUS) ×2 IMPLANT
PRESSURIZER FEMORAL UNIV (MISCELLANEOUS) IMPLANT
RETRIEVER SUT HEWSON (MISCELLANEOUS) ×2 IMPLANT
SET HNDPC FAN SPRY TIP SCT (DISPOSABLE) IMPLANT
SPACER FEM TAPERED +0 12/14 (Hips) IMPLANT
STEM SUMMIT BASIC PRESSFIT SZ3 (Hips) IMPLANT
SUCTION FRAZIER HANDLE 10FR (MISCELLANEOUS) ×1
SUCTION TUBE FRAZIER 10FR DISP (MISCELLANEOUS) IMPLANT
SUMMIT BASIC PRESSFIT SZ3 (Hips) ×1 IMPLANT
SUT FIBERWIRE #2 38 REV NDL BL (SUTURE) ×2
SUT VIC AB 0 CT1 27 (SUTURE) ×1
SUT VIC AB 0 CT1 27XBRD ANBCTR (SUTURE) ×2 IMPLANT
SUT VIC AB 1 CT1 27 (SUTURE) ×2
SUT VIC AB 1 CT1 27XBRD ANBCTR (SUTURE) ×4 IMPLANT
SUT VIC AB 2-0 CT2 27 (SUTURE) IMPLANT
SUT VIC AB 3-0 SH 8-18 (SUTURE) ×2 IMPLANT
SUTURE FIBERWR#2 38 REV NDL BL (SUTURE) ×4 IMPLANT
SYR CONTROL 10ML LL (SYRINGE) ×2 IMPLANT
TOWEL GREEN STERILE (TOWEL DISPOSABLE) ×2 IMPLANT
TOWEL GREEN STERILE FF (TOWEL DISPOSABLE) ×2 IMPLANT
TOWER CARTRIDGE SMART MIX (DISPOSABLE) IMPLANT
TRAY FOLEY MTR SLVR 16FR STAT (SET/KITS/TRAYS/PACK) ×2 IMPLANT
WATER STERILE IRR 1000ML POUR (IV SOLUTION) ×8 IMPLANT

## 2022-05-19 NOTE — Anesthesia Postprocedure Evaluation (Signed)
Anesthesia Post Note  Patient: Estelle Unrein  Procedure(s) Performed: LEFT PARTIAL HIP ARTHROPLASTY (Left: Hip)     Patient location during evaluation: PACU Anesthesia Type: Spinal Level of consciousness: awake and alert, patient cooperative and oriented Pain management: pain level controlled Vital Signs Assessment: post-procedure vital signs reviewed and stable Respiratory status: nonlabored ventilation, spontaneous breathing and respiratory function stable Cardiovascular status: blood pressure returned to baseline and stable Postop Assessment: no apparent nausea or vomiting and spinal receding Anesthetic complications: no   No notable events documented.  Last Vitals:  Vitals:   05/19/22 1600 05/19/22 1615  BP: (!) 154/81 (!) 160/86  Pulse: 70 62  Resp: 16 18  Temp: (!) 36.2 C   SpO2: 100% 98%    Last Pain:  Vitals:   05/19/22 1600  TempSrc:   PainSc: 0-No pain                 Esty Ahuja,E. Carlon Chaloux

## 2022-05-19 NOTE — Anesthesia Preprocedure Evaluation (Signed)
Anesthesia Evaluation  Patient identified by MRN, date of birth, ID band Patient awake    Reviewed: Allergy & Precautions, NPO status , Patient's Chart, lab work & pertinent test results  History of Anesthesia Complications Negative for: history of anesthetic complications  Airway Mallampati: I  TM Distance: >3 FB Neck ROM: Full    Dental  (+) Dental Advisory Given, Poor Dentition, Chipped   Pulmonary neg pulmonary ROS   breath sounds clear to auscultation       Cardiovascular hypertension, Pt. on medications (-) angina + dysrhythmias Atrial Fibrillation + Valvular Problems/Murmurs (AVR)  Rhythm:Irregular Rate:Normal  S/p [place] 03/2022 ECHO: EF 55-60%. The LV has normal function. mild concentric left  ventricular hypertrophy. Left ventricular diastolic parameters are consistent with Grade II diastolic dysfunction (pseudonormalization). Elevated left atrial pressure.   2. Right ventricular systolic function is mildly reduced. The right  ventricular size is normal. There is normal pulmonary artery systolic  pressure. The estimated right ventricular systolic pressure is Q000111Q mmHg.   3. Left atrial size was mildly dilated.   4. The mitral valve is myxomatous. Mild mitral valve regurgitation.   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   6. The aortic valve has been repaired/replaced. Aortic valve  regurgitation is not visualized. No aortic stenosis is present. There is a  valve present in the aortic position. Echo findings are consistent with  normal structure and function of the aortic  valve prosthesis     Neuro/Psych   Anxiety     TIACVA (L weakness), Residual Symptoms    GI/Hepatic Neg liver ROS, hiatal hernia,,,  Endo/Other  negative endocrine ROS    Renal/GU negative Renal ROS     Musculoskeletal  (+) Arthritis ,    Abdominal   Peds  Hematology  (+) Blood  dyscrasia (Hb 9.9), anemia   Anesthesia Other Findings   Reproductive/Obstetrics                              Anesthesia Physical Anesthesia Plan  ASA: 3  Anesthesia Plan: Spinal   Post-op Pain Management: Tylenol PO (pre-op)*   Induction:   PONV Risk Score and Plan: 2 and Ondansetron and Treatment may vary due to age or medical condition  Airway Management Planned:   Additional Equipment: None  Intra-op Plan:   Post-operative Plan:   Informed Consent: I have reviewed the patients History and Physical, chart, labs and discussed the procedure including the risks, benefits and alternatives for the proposed anesthesia with the patient or authorized representative who has indicated his/her understanding and acceptance.   Patient has DNR.  Discussed DNR with patient and Suspend DNR.   Dental advisory given  Plan Discussed with: CRNA and Surgeon  Anesthesia Plan Comments:          Anesthesia Quick Evaluation

## 2022-05-19 NOTE — Anesthesia Procedure Notes (Addendum)
Spinal  Patient location during procedure: OR End time: 05/19/2022 2:17 PM Reason for block: surgical anesthesia Staffing Performed: anesthesiologist  Anesthesiologist: Annye Asa, MD Performed by: Annye Asa, MD Authorized by: Annye Asa, MD   Preanesthetic Checklist Completed: patient identified, IV checked, site marked, risks and benefits discussed, surgical consent, monitors and equipment checked, pre-op evaluation and timeout performed Spinal Block Patient position: sitting Prep: DuraPrep and site prepped and draped Patient monitoring: blood pressure, continuous pulse ox, cardiac monitor and heart rate Approach: midline Location: L3-4 Injection technique: single-shot Needle Needle type: Pencan and Introducer  Needle gauge: 24 G Needle length: 9 cm Assessment Events: CSF return Additional Notes Pt identified in Operating room.  Monitors applied. Working IV access confirmed. Sterile prep, drape lumbar spine.  1% lido local L 3,4.  #24ga Pencan into clear CSF L 3,4.  '12mg'$  0.75% Bupivacaine with dextrose injected with asp CSF beginning and end of injection.  Patient asymptomatic, VSS, no heme aspirated, tolerated well.  Jenita Seashore, MD

## 2022-05-19 NOTE — Progress Notes (Signed)
Patient seen and examined.  Left femoral neck fracture.  Plan left hip hemiarthroplasty.   The risks benefits and alternatives were discussed with the patient including but not limited to the risks of nonoperative treatment, versus surgical intervention including infection, bleeding, nerve injury, periprosthetic fracture, the need for revision surgery, dislocation, leg length discrepancy, blood clots, cardiopulmonary complications, morbidity, mortality, among others, and they were willing to proceed.    Johnny Bridge, MD

## 2022-05-19 NOTE — Op Note (Signed)
05/17/2022 - 05/19/2022  3:22 PM  PATIENT:  Yvonne Ballard   MRN: [id number]  PRE-OPERATIVE DIAGNOSIS:  closed fracture of left hip, femoral neck  POST-OPERATIVE DIAGNOSIS:  closed fracture of left hip, femoral neck  PROCEDURE:  Procedure(s): Left hip hemiarthroplasty  PREOPERATIVE INDICATIONS:  Yvonne Ballard is an 82 y.o. female who was admitted 05/17/2022 with a diagnosis of Closed displaced fracture of left femoral neck ([place]) and elected for surgical management.  The risks benefits and alternatives were discussed with the patient including but not limited to the risks of nonoperative treatment, versus surgical intervention including infection, bleeding, nerve injury, periprosthetic fracture, the need for revision surgery, dislocation, leg length discrepancy, blood clots, cardiopulmonary complications, morbidity, mortality, among others, and they were willing to proceed.    OPERATIVE REPORT     SURGEON:  Marchia Bond, MD    ASSISTANT:  Merlene Pulling, PA-C,   (Present throughout the entire procedure,  necessary for completion of procedure in a timely manner, assisting with retraction, instrumentation, and closure)     ANESTHESIA: Spinal  ESTIMATED BLOOD LOSS: Q000111Q mL    COMPLICATIONS:  None.   UNIQUE ASPECTS OF THE CASE: The neck fracture was fairly high, bone quality was poor, she was extremely fragile.  I did have excellent fixation on the femur, although I had to work to get the 3 to fit well, and I was still probably 1 tooth proud.     COMPONENTS:  Chemical engineer Femoral Fracture stem size 3, with a 0 spacer and a size 44 fracture head unipolar hip ball.    PROCEDURE IN DETAIL: The patient was met in the holding area and identified.  The appropriate hip  was marked at the operative site. The patient was then transported to the OR and  placed under anesthesia.  At that point, the patient was  placed in the lateral decubitus position with the operative side up and  secured  to the operating room table and all bony prominences padded.     The operative lower extremity was prepped from the iliac crest to the toes.  Sterile draping was performed.  Time out was performed prior to incision.      A routine posterolateral approach was utilized via sharp dissection  carried down to the subcutaneous tissue.  Gross bleeders were Bovie  coagulated.  The iliotibial band was identified and incised  along the length of the skin incision.  Self-retaining retractors were  inserted.  With the hip internally rotated, the short external rotators  were identified. The piriformis was tagged with FiberWire, and the hip capsule released in a T-type fashion.  The femoral neck was exposed, and I resected the femoral neck using the appropriate jig. This was performed at approximately a thumb's breadth above the lesser trochanter.    I then exposed the deep acetabulum, cleared out any tissue including the ligamentum teres, and included the hip capsule in the FiberWire used above and below the T.    I then prepared the proximal femur using the cookie-cutter, the lateralizing reamer, and then sequentially broached.  A trial utilized, and I reduced the hip and it was found to have excellent stability with functional range of motion. The trial components were then removed.   I then place the real implant and I impacted the real head ball into place. The hip was then reduced and taken through functional range of motion and found to have excellent stability. Leg lengths were restored.  I  then used a 2 mm drill bits to pass the FiberWire suture from the capsule and piriformis through the greater trochanter, and secured this. Excellent posterior capsular repair was achieved. I also closed the T in the capsule.  I then irrigated the hip copiously again with pulse lavage, and repaired the fascia with Vicryl, followed by Vicryl for the subcutaneous tissue, Monocryl for the skin, Steri-Strips and sterile  gauze. The wounds were injected. The patient was then awakened and returned to PACU in stable and satisfactory condition. There were no complications.  Marchia Bond, MD Orthopedic Surgeon [phone]   05/19/2022 3:22 PM

## 2022-05-19 NOTE — TOC CAGE-AID Note (Signed)
Transition of Care [facility]) - CAGE-AID Screening  Patient Details  Name: Yvonne Ballard MRN: [id number] Date of Birth: 01-07-1941  Clinical Narrative:  Patient to ED after a ground level fall causing a hip fx. Patient endorses occasional alcohol use in moderation, no drug use. Denies need for any resources at this time.  CAGE-AID Screening:   Have You Ever Felt You Ought to Cut Down on Your Drinking or Drug Use?: No Have People Annoyed You By Critizing Your Drinking Or Drug Use?: No Have You Felt Bad Or Guilty About Your Drinking Or Drug Use?: No Have You Ever Had a Drink or Used Drugs First Thing In The Morning to Steady Your Nerves or to Get Rid of a Hangover?: No CAGE-AID Score: 0  Substance Abuse Education Offered: No

## 2022-05-19 NOTE — Progress Notes (Signed)
Progress Note  Patient Name: Yvonne Ballard Date of Encounter: 05/19/2022  Primary Cardiologist: Peter Martinique, MD   Subjective   Overnight no events . Patient notes no symptoms.  Hungry and awaiting surgery. Patient notes that she will not take a statin.  Inpatient Medications    Scheduled Meds:  Chlorhexidine Gluconate Cloth  6 each Topical Q0600   diltiazem  120 mg Oral BID   feeding supplement  237 mL Oral BID BM   irbesartan  75 mg Oral BID   multivitamin with minerals  1 tablet Oral Daily   mupirocin ointment  1 Application Nasal BID   Continuous Infusions:  famotidine (PEPCID) IV     PRN Meds: acetaminophen, hydrALAZINE, HYDROcodone-acetaminophen, metoCLOPramide (REGLAN) injection, morphine injection, ondansetron (ZOFRAN) IV   Vital Signs    Vitals:   05/18/22 1954 05/18/22 2300 05/19/22 0500 05/19/22 0748  BP: (!) 148/77 (!) 144/76 (!) 142/102 (!) 149/88  Pulse: (!) 101 62 66 65  Resp: '20 15 17 20  '$ Temp: 99 F (37.2 C) 99.8 F (37.7 C) 99.3 F (37.4 C) 99 F (37.2 C)  TempSrc: Oral Oral Oral Oral  SpO2: 98% 99% 99% 99%  Weight:      Height:        Intake/Output Summary (Last 24 hours) at 05/19/2022 1031 Last data filed at 05/19/2022 0500 Gross per 24 hour  Intake 839.31 ml  Output 100 ml  Net 739.31 ml   Filed Weights   05/17/22 1722  Weight: 44.2 kg    Telemetry    SR with frequent PACs, RBBB - Personally Reviewed   Physical Exam   Gen: no distress, elderly female   Neck: No JVD,  Cardiac: No Rubs or Gallops, systolic Murmur, IRIR, +2 radial pulses Respiratory: Clear to auscultation bilaterally, normal effort, normal  respiratory rate GI: Soft, nontender, non-distended  Integument: Skin feels warm Neuro:  At time of evaluation, alert and oriented to person/place/time/situation  Psych: Normal affect, patient feels ok   Labs    Chemistry Recent Labs  Lab 05/17/22 1732 05/17/22 1740 05/19/22 0130  NA 138 138 135  K 3.4* 3.4*  3.8  CL 102 100 99  CO2 24  --  25  GLUCOSE 125* 124* 133*  BUN '12 12 19  '$ CREATININE 0.62 0.60 0.76  CALCIUM 8.8*  --  9.1  GFRNONAA >60  --  >60  ANIONGAP 12  --  11     Hematology Recent Labs  Lab 05/17/22 1732 05/17/22 1740 05/19/22 0130  WBC 6.5  --  9.4  RBC 3.58*  --  3.67*  HGB 9.7* 10.9* 9.9*  HCT 31.7* 32.0* 31.4*  MCV 88.5  --  85.6  MCH 27.1  --  27.0  MCHC 30.6  --  31.5  RDW 13.6  --  14.3  PLT 195  --  227    Cardiac EnzymesNo results for input(s): "TROPONINI" in the last 168 hours. No results for input(s): "TROPIPOC" in the last 168 hours.   BNPNo results for input(s): "BNP", "PROBNP" in the last 168 hours.   DDimer No results for input(s): "DDIMER" in the last 168 hours.   Radiology    DG FEMUR MIN 2 VIEWS LEFT  Result Date: 05/17/2022 CLINICAL DATA:  Hip fracture EXAM: LEFT FEMUR 2 VIEWS COMPARISON:  05/17/2022 FINDINGS: There is a left femoral neck fracture with varus angulation. Old left superior and inferior pubic rami fractures. No subluxation or dislocation. No additional femoral abnormality. No joint  effusion in the left knee. IMPRESSION: Left femoral neck fracture with varus angulation. Electronically Signed   By: Rolm Baptise M.D.   On: 05/17/2022 19:08   DG Hip [place] or [place] Pelvis 1 View Left  Result Date: 05/17/2022 CLINICAL DATA:  Level 2 trauma.  Fall.  Left hip injury. EXAM: DG HIP (WITH OR WITHOUT PELVIS) 1V PORT LEFT COMPARISON:  Pelvis and left hip radiographs 09/15/2016 FINDINGS: There is diffuse decreased bone mineralization. Interval healing of the previously seen minimally displaced fracture at the junction of the left superior pubic ramus and the left acetabulum in the mid left inferior pubic ramus. There is a new transverse fracture of the proximal left femoral neck with approximately 11 mm lateral displacement and mild superior displacement of the distal fracture component with respect to the proximal fracture component.  Mild-to-moderate varus angulation. The bilateral femoroacetabular joint spaces are maintained. Severe pubic symphysis joint space narrowing. Moderate atherosclerotic calcifications. IMPRESSION: Acute transverse fracture of the proximal left femoral neck with mild-to-moderate superolateral displacement of the distal fracture component and mild to moderate varus angulation. Electronically Signed   By: Yvonne Kendall M.D.   On: 05/17/2022 17:43   DG Chest Portable 1 View  Result Date: 05/17/2022 CLINICAL DATA:  Pain after fall EXAM: PORTABLE CHEST 1 VIEW COMPARISON:  Chest x-ray November 15, 2019 FINDINGS: The cardiomediastinal silhouette is stable with a markedly tortuous thoracic aorta. No pneumothorax. The lungs are clear. No acute abnormalities identified. IMPRESSION: No active disease. Electronically Signed   By: Dorise Bullion III M.D.   On: 05/17/2022 17:39      Patient Profile     82 y.o. female with prior Bentall, planned for surgery today  Assessment & Plan     Nonobstructive CAD HLD Prior CVA - asymptomatic   - patient has refused statin; remove - Likely discharge on plavix monotherapy, per neurology OP follow up (given recent stroke), when safe per surgeon. Antiplatelet therapy may be altered should she have a need for [place].   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 Some elevation is to be expected with pain and tachycardia.  Appears to be a challenge to control, but is close to her baseline. Would hesitate to push her goal SBP below 140 given age, frailty, and antiplatelet regimen - Currently on diltiazem and irbesartan     Irregular rhythm, PACs - continue diltiazem; heart rate may be pain related    No cardiac barriers to surgery later today, will sign  off.   For questions or updates, please contact [facility] Please consult [email or web address] for contact info under Cardiology/STEMI.      Rudean Haskell, MD [facility] [address], [address] [place], [place] [address] [phone]  10:31 AM

## 2022-05-19 NOTE — Addendum Note (Signed)
Addendum  created 05/19/22 1633 by Annye Asa, MD   Intraprocedure Meds edited

## 2022-05-19 NOTE — Transfer of Care (Signed)
Immediate Anesthesia Transfer of Care Note  Patient: Yvonne Ballard  Procedure(s) Performed: LEFT PARTIAL HIP ARTHROPLASTY (Left: Hip)  Patient Location: PACU  Anesthesia Type:Spinal  Level of Consciousness: drowsy  Airway & Oxygen Therapy: Patient Spontanous Breathing and Patient connected to nasal cannula oxygen  Post-op Assessment: Report given to RN and Post -op Vital signs reviewed and stable  Post vital signs: Reviewed and stable  Last Vitals:  Vitals Value Taken Time  BP 155/91 05/19/22 1556  Temp    Pulse 70 05/19/22 1600  Resp 16 05/19/22 1600  SpO2 80 % 05/19/22 1600  Vitals shown include unvalidated device data.  Last Pain:  Vitals:   05/19/22 1259  TempSrc:   PainSc: 0-No pain      Patients Stated Pain Goal: 0 ([phone] [id number])  Complications: No notable events documented.

## 2022-05-19 NOTE — Progress Notes (Signed)
PROGRESS NOTE Audell Wyman  [id number] DOB: [birth year] DOA: 05/17/2022 PCP: [name], MD  Brief Narrative/Hospital Course: 82 year old female history of stroke in January 2024, hypertension, hyperlipidemia, CAD, PMR admitted after mechanical fall.. Pt lives in ALF([facility]). Pt states she was walking with her walker to her bedroom to take a nap. Lost her balance and fell onto her left side.   In the ED uncontrolled hypertension 210/80 afebrile heart rate variable 55-102, on room air.  Labs with mild hypokalemia normocytic anemia 9.7.  Imaging chest x-ray unremarkable hip x-ray showed acute transverse fracture of the proximal left femoral neck with mild to moderate superolateral displacement of the distal fracture component and valgus angulation.  EKG showed NSR with PACs on admission review. Orthopedic surgery consulted and admitted   Subjective: Seen and examined Patient reports she is doing well after getting morphine overnight Overnight afebrile heart rate blood pressure controlled, remains on Cardizem ARBs Labs with fairly stable CBC BMP Denies nausea vomiting chest pain.  Assessment and Plan: Principal Problem:   Closed displaced fracture of left femoral neck (HCC) Active Problems:   Benign essential HTN   CAD (coronary artery disease), native coronary artery   Dyslipidemia, goal LDL below 70   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HCC)   History of CVA (cerebrovascular accident)- 03/14/2022   DNR (do not resuscitate)/DNI(Do Not Intubate)   Hypokalemia   Closed displaced fracture of left femoral neck secondary to fall: Appreciate orthopedic input plan to operative intervention today. Seen by cardiology for preoperative risk stratification, no invasive cardiac workup needed prior to surgery, acceptable risk for intermediate risk procedure seh has had recent CVA, hx of  CAD, CHF, echo from January/2/24 EF 14 to 60% with RWMA, G2 DD, with replaced/repaired aortic valve normal  structure and function of the aortic valve prosthesis. Cont pain control PT OT DVT prophylaxis as per orthopedics.  Recent CVA 03/14/2022:MRI 03/14/22 showed 9 mm acute infarct of the posterior limb of right internal capsule with remote infarct of the posterior right lentiform nucleus extending superiorly to the corona radiata, extensive periventricular subcortical white matter disease. Patient on asa and plavix: Holding for surgery, resume ONLY PLAVIX postop once cleared by Ortho-based upon discharge summary 1/5 patient was recommended to continue aspirin Plavix x 3 weeks followed by Plavix alone.Of note patient had refused statin.  Nausea vomiting-resolved. Continue symptomatic management Zofran/Phenergan, IV fluid hydration Pepcid.  [id number]. Dyslipidemia, goal LDL below 70:refused statin on last admit.  [id number]. Benign essential HTN: BP poorly controlled on admission likely from pain.currently well-controlled  on avaprol/cardizem Hypokalemia: Resolved  History of tachycardia with PAC on previous EKG as well-managed with Cardizem-tachycardic in the setting of pain Normocytic anemia: Monitor hemoglobin-stable overall Recent Labs  Lab 05/17/22 1732 05/17/22 1740 05/19/22 0130  HGB 9.7* 10.9* 9.9*  HCT 31.7* 32.0* 31.4*  Chronic heart failure with preserved EF: Her echo back in January showed EF 55 to 60% with RWMA History of thoracic aortic aneurysm Aortic stenosis status post biosynthetic TAVR: stable w/ normal echo in jan. GOC :DNR Verified with patient on admission  DVT prophylaxis: SCDs Start: 05/17/22 2139 Code Status:   Code Status: DNR Family Communication: plan of care discussed with patient at bedside. Patient status is: Inpatient because of hip fracture Level of care: Telemetry Cardiac   Dispo: The patient is from: ALF            Anticipated disposition: SNF TBD 2 DAYS Objective: Vitals last 24 hrs: Vitals:   05/18/22 1954 05/18/22 2300  05/19/22 0500 05/19/22 0748  BP:  (!) 148/77 (!) 144/76 (!) 142/102 (!) 149/88  Pulse: (!) 101 62 66 65  Resp: '20 15 17 20  '$ Temp: 99 F (37.2 C) 99.8 F (37.7 C) 99.3 F (37.4 C) 99 F (37.2 C)  TempSrc: Oral Oral Oral Oral  SpO2: 98% 99% 99% 99%  Weight:      Height:       Weight change:   Physical Examination: General exam: AAox3, weak,older appearing HEENT:Oral mucosa moist, Ear/Nose WNL grossly, dentition normal. Respiratory system: bilaterally clear BS,no use of accessory muscle Cardiovascular system: S1 & S2 +, regular rate,. Gastrointestinal system: Abdomen soft, NT,ND,BS+ Nervous System:Alert, awake, moving extremities and grossly nonfocal Extremities: LE ankle edema neg, lle ext rotated Skin: No rashes,no icterus. MSK: Normal muscle bulk,tone, power   Medications reviewed:  Scheduled Meds:  Chlorhexidine Gluconate Cloth  6 each Topical Q0600   diltiazem  120 mg Oral BID   feeding supplement  237 mL Oral BID BM   irbesartan  75 mg Oral BID   multivitamin with minerals  1 tablet Oral Daily   mupirocin ointment  1 Application Nasal BID   Continuous Infusions:  famotidine (PEPCID) IV      Diet Order             Diet NPO time specified  Diet effective midnight                  Intake/Output Summary (Last 24 hours) at 05/19/2022 1002 Last data filed at 05/19/2022 0500 Gross per 24 hour  Intake 839.31 ml  Output 100 ml  Net 739.31 ml   Net IO Since Admission: -1,760.69 mL [05/19/22 1002]  Wt Readings from Last 3 Encounters:  05/17/22 44.2 kg  05/04/22 44.2 kg  03/15/22 43.4 kg   Unresulted Labs (From admission, onward)     Start     Ordered   05/19/22 0500  CBC  Daily,   R      05/18/22 1108   05/19/22 [id number]  Basic metabolic panel  Daily,   R      05/18/22 1108          Data Reviewed: I have personally reviewed following labs and imaging studies CBC: Recent Labs  Lab 05/17/22 1732 05/17/22 1740 05/19/22 0130  WBC 6.5  --  9.4  HGB 9.7* 10.9* 9.9*  HCT 31.7* 32.0* 31.4*   MCV 88.5  --  85.6  PLT 195  --  [id number]   Basic Metabolic Panel: Recent Labs  Lab 05/17/22 1732 05/17/22 1740 05/19/22 0130  NA 138 138 135  K 3.4* 3.4* 3.8  CL 102 100 99  CO2 24  --  25  GLUCOSE 125* 124* 133*  BUN '12 12 19  '$ CREATININE 0.62 0.60 0.76  CALCIUM 8.8*  --  9.1   Recent Results (from the past 240 hour(s))  Surgical pcr screen     Status: Abnormal   Collection Time: 05/18/22  6:05 PM   Specimen: Nasal Mucosa; Nasal Swab  Result Value Ref Range Status   MRSA, PCR POSITIVE (A) NEGATIVE Final    Comment: RESULT CALLED TO, READ BACK BY AND VERIFIED WITH: RN NDEYA ON 05/18/22 @ 2057 BY DRT    Staphylococcus aureus POSITIVE (A) NEGATIVE Final    Comment: (NOTE) The Xpert SA Assay (FDA approved for NASAL specimens in patients 58 years of age and older), is one component of a comprehensive surveillance program. It is not intended to  diagnose infection nor to guide or monitor treatment. Performed at [facility], [place] [address] [place], [place] [address]     Antimicrobials: Anti-infectives (From admission, onward)    None      Culture/Microbiology    Component Value Date/Time   SDES URINE, CLEAN CATCH 04/19/2014 0610   SPECREQUEST NONE 04/19/2014 0610   CULT  04/19/2014 0610    Multiple bacterial morphotypes present, none predominant. Suggest appropriate recollection if clinically indicated. Performed at [facility] 04/21/2014 FINAL 04/19/2014 0610    Radiology Studies: DG FEMUR MIN 2 VIEWS LEFT  Result Date: 05/17/2022 CLINICAL DATA:  Hip fracture EXAM: LEFT FEMUR 2 VIEWS COMPARISON:  05/17/2022 FINDINGS: There is a left femoral neck fracture with varus angulation. Old left superior and inferior pubic rami fractures. No subluxation or dislocation. No additional femoral abnormality. No joint effusion in the left knee. IMPRESSION: Left femoral neck fracture with varus angulation. Electronically Signed   By: Rolm Baptise M.D.   On:  05/17/2022 19:08   DG Hip [place] or [place] Pelvis 1 View Left  Result Date: 05/17/2022 CLINICAL DATA:  Level 2 trauma.  Fall.  Left hip injury. EXAM: DG HIP (WITH OR WITHOUT PELVIS) 1V PORT LEFT COMPARISON:  Pelvis and left hip radiographs 09/15/2016 FINDINGS: There is diffuse decreased bone mineralization. Interval healing of the previously seen minimally displaced fracture at the junction of the left superior pubic ramus and the left acetabulum in the mid left inferior pubic ramus. There is a new transverse fracture of the proximal left femoral neck with approximately 11 mm lateral displacement and mild superior displacement of the distal fracture component with respect to the proximal fracture component. Mild-to-moderate varus angulation. The bilateral femoroacetabular joint spaces are maintained. Severe pubic symphysis joint space narrowing. Moderate atherosclerotic calcifications. IMPRESSION: Acute transverse fracture of the proximal left femoral neck with mild-to-moderate superolateral displacement of the distal fracture component and mild to moderate varus angulation. Electronically Signed   By: Yvonne Kendall M.D.   On: 05/17/2022 17:43   DG Chest Portable 1 View  Result Date: 05/17/2022 CLINICAL DATA:  Pain after fall EXAM: PORTABLE CHEST 1 VIEW COMPARISON:  Chest x-ray November 15, 2019 FINDINGS: The cardiomediastinal silhouette is stable with a markedly tortuous thoracic aorta. No pneumothorax. The lungs are clear. No acute abnormalities identified. IMPRESSION: No active disease. Electronically Signed   By: Dorise Bullion III M.D.   On: 05/17/2022 17:39     LOS: 2 days   Antonieta Pert, MD [facility]  05/19/2022, 10:02 AM

## 2022-05-20 ENCOUNTER — Encounter (HOSPITAL_COMMUNITY): Payer: Self-pay | Admitting: Orthopedic Surgery

## 2022-05-20 DIAGNOSIS — S72002A Fracture of unspecified part of neck of left femur, initial encounter for closed fracture: Secondary | ICD-10-CM | POA: Diagnosis not present

## 2022-05-20 LAB — BASIC METABOLIC PANEL
Anion gap: 7 (ref 5–15)
BUN: 25 mg/dL — ABNORMAL HIGH (ref 8–23)
CO2: 26 mmol/L (ref 22–32)
Calcium: 8.2 mg/dL — ABNORMAL LOW (ref 8.9–10.3)
Chloride: 99 mmol/L (ref 98–111)
Creatinine, Ser: 0.76 mg/dL (ref 0.44–1.00)
GFR, Estimated: >60 mL/min (ref >60)
Glucose, Bld: 147 mg/dL — ABNORMAL HIGH (ref 70–99)
Potassium: 4.2 mmol/L (ref 3.5–5.1)
Sodium: 132 mmol/L — ABNORMAL LOW (ref 135–145)

## 2022-05-20 LAB — CBC
HCT: 27.4 % — ABNORMAL LOW (ref 36.0–46.0)
Hemoglobin: 8.7 g/dL — ABNORMAL LOW (ref 12.0–15.0)
MCH: 27.4 pg (ref 26.0–34.0)
MCHC: 31.8 g/dL (ref 30.0–36.0)
MCV: 86.4 fL (ref 80.0–100.0)
Platelets: 181 10*3/uL (ref 150–400)
RBC: 3.17 MIL/uL — ABNORMAL LOW (ref 3.87–5.11)
RDW: 14.3 % (ref 11.5–15.5)
WBC: 10.5 10*3/uL (ref 4.0–10.5)
nRBC: 0 % (ref 0.0–0.2)

## 2022-05-20 MED ORDER — ASPIRIN 81 MG PO TBEC
81.0000 mg | DELAYED_RELEASE_TABLET | Freq: Every day | ORAL | Status: DC
  Administered 2022-05-20 – 2022-05-24 (×5): 81 mg via ORAL
  Filled 2022-05-20 (×5): qty 1

## 2022-05-20 MED ORDER — CLOPIDOGREL BISULFATE 75 MG PO TABS
75.0000 mg | ORAL_TABLET | Freq: Every day | ORAL | Status: DC
  Administered 2022-05-20 – 2022-05-24 (×5): 75 mg via ORAL
  Filled 2022-05-20 (×5): qty 1

## 2022-05-20 NOTE — Progress Notes (Signed)
Mobility Specialist Progress Note:   05/20/22 1230  Mobility  Activity Ambulated with assistance in hallway  Level of Assistance Standby assist, set-up cues, supervision of patient - no hands on  Assistive Device Front wheel walker  Distance Ambulated (ft) 50 ft  Activity Response Tolerated well  Mobility Referral Yes  $Mobility charge 1 Mobility   Pt in chair willing to participate in mobility. No complaints of pain. Left in chair with call bell in reach and all needs met.   Gareth Eagle Shandon Matson Mobility Specialist Please contact via [employer] or  Rehab Office at [phone]

## 2022-05-20 NOTE — Social Work (Signed)
Unable to complete assessment- patient working with PT - called daughter , unable to reach her.   TOC will continue to follow and assist with discharge planning.  Thurmond Butts, MSW, LCSW Clinical Social Worker

## 2022-05-20 NOTE — Evaluation (Addendum)
Physical Therapy Evaluation Patient Details Name: Yvonne Ballard MRN: [id number] DOB: [birth year] Today's Date: 05/20/2022  History of Present Illness  Patient is a 82 y/o female who presents with left femoral neck fx s/p fall at home now s/p left hip hemiarthroplasty 3/7. PMH includes CVA, HTN, HF, aortic stenosis s/p biosynthetic TAVR, CAD.  Clinical Impression  Patient presents with pain, impaired mobility and post surgical deficits s/p above. Pt lives at [facility] in [place] and reports being mod I for ADLs at baseline and using RW vs SPC for ambulation. Pt recently stopped rehab from stroke in January. Today, pt requires Min A-min guard assist for transfers and gait training with use of RW for support. Pt eager to return home back to ALF and not go to rehab if possible. Will try to see as much as possible while in the hospital and mobility tech will be consulted as well. Pt's daughter will be coming in from [place] and will be staying with her for 1 week. Will follow acutely to maximize independence and mobility prior to return home.    Addendum: daughter worried that pt will be ready to be alone in her apt after she leaves for the week and interested in SNF level rehab so discharge recommendation updated.      Recommendations for follow up therapy are one component of a multi-disciplinary discharge planning process, led by the attending physician.  Recommendations may be updated based on patient status, additional functional criteria and insurance authorization.  Follow Up Recommendations SNF      Assistance Recommended at Discharge Frequent or constant Supervision/Assistance  Patient can return home with the following  A little help with walking and/or transfers;A little help with bathing/dressing/bathroom;Assistance with cooking/housework;Assist for transportation;Help with stairs or ramp for entrance    Equipment Recommendations None recommended by PT  Recommendations for Other  Services       Functional Status Assessment Patient has had a recent decline in their functional status and demonstrates the ability to make significant improvements in function in a reasonable and predictable amount of time.     Precautions / Restrictions Precautions Precautions: Fall Restrictions Weight Bearing Restrictions: Yes LLE Weight Bearing: Weight bearing as tolerated      Mobility  Bed Mobility Overal bed mobility: Needs Assistance Bed Mobility: Supine to Sit     Supine to sit: Supervision, HOB elevated     General bed mobility comments: Increased time and use of rail to get to EOB, using UEs to bring LLE to EOB.    Transfers Overall transfer level: Needs assistance Equipment used: Rolling walker (2 wheels) Transfers: Sit to/from Stand Sit to Stand: Min guard, Min assist           General transfer comment: Min A-Min guard for safety. Stood from [employer], from chair x1, transferred to chair post ambulation.    Ambulation/Gait Ambulation/Gait assistance: Min guard Gait Distance (Feet): 35 Feet (x2 bouts) Assistive device: Rolling walker (2 wheels) Gait Pattern/deviations: Step-to pattern, Decreased stance time - left, Decreased step length - right, Trunk flexed Gait velocity: decreased     General Gait Details: Slow, step to gait with increased WB through BUEs. 1 seated rest break. HR up to 130 bpm with activity, A-fib  Stairs            Wheelchair Mobility    Modified Rankin (Stroke Patients Only)       Balance Overall balance assessment: Needs assistance Sitting-balance support: Feet supported, No upper extremity supported Sitting  balance-Leahy Scale: Fair     Standing balance support: During functional activity, Bilateral upper extremity supported Standing balance-Leahy Scale: Poor Standing balance comment: Requires UE support in standing                             Pertinent Vitals/Pain Pain Assessment Pain Assessment:  Faces Faces Pain Scale: Hurts little more Pain Location: left hip Pain Descriptors / Indicators: Sore Pain Intervention(s): Monitored during session, Limited activity within patient's tolerance, Repositioned    Home Living Family/patient expects to be discharged to:: Assisted living [facility])                 Home Equipment: Conservation officer, nature (2 wheels);Tub bench;Cane - single point;Grab bars - tub/shower Additional Comments: Has a daughter that is coming in from [place] on Sunday and can stay withj her for the week.    Prior Function Prior Level of Function : Independent/Modified Independent             Mobility Comments: Using RW vs SPC ADLs Comments: Mod I     Hand Dominance   Dominant Hand: Right    Extremity/Trunk Assessment   Upper Extremity Assessment Upper Extremity Assessment: Defer to OT evaluation    Lower Extremity Assessment Lower Extremity Assessment: LLE deficits/detail LLE Deficits / Details: post op deficits- able to perform LAQ but limited in hip flexion AROM LLE Sensation: WNL    Cervical / Trunk Assessment Cervical / Trunk Assessment: Kyphotic  Communication   Communication: No difficulties  Cognition Arousal/Alertness: Awake/alert Behavior During Therapy: WFL for tasks assessed/performed Overall Cognitive Status: Within Functional Limits for tasks assessed                                          General Comments General comments (skin integrity, edema, etc.): HR up to 130 bpm max with activity. A-fib.    Exercises     Assessment/Plan    PT Assessment Patient needs continued PT services  PT Problem List Decreased strength;Decreased mobility;Pain;Decreased balance;Decreased skin integrity;Decreased activity tolerance       PT Treatment Interventions Therapeutic activities;Gait training;Therapeutic exercise;Patient/family education;Balance training;Functional mobility training    PT Goals (Current goals can  be found in the Care Plan section)  Acute Rehab PT Goals Patient Stated Goal: to go home and not rehab PT Goal Formulation: With patient Time For Goal Achievement: 06/03/22 Potential to Achieve Goals: Good    Frequency Min 4X/week     Co-evaluation               AM-PAC PT "6 Clicks" Mobility  Outcome Measure Help needed turning from your back to your side while in a flat bed without using bedrails?: A Little Help needed moving from lying on your back to sitting on the side of a flat bed without using bedrails?: A Little Help needed moving to and from a bed to a chair (including a wheelchair)?: A Little Help needed standing up from a chair using your arms (e.g., wheelchair or bedside chair)?: A Little Help needed to walk in hospital room?: A Little Help needed climbing 3-5 steps with a railing? : A Lot 6 Click Score: 17    End of Session Equipment Utilized During Treatment: Gait belt Activity Tolerance: Patient tolerated treatment well Patient left: in chair;with call bell/phone within reach;with nursing/sitter in room Nurse  Communication: Mobility status PT Visit Diagnosis: Pain;Difficulty in walking, not elsewhere classified (R26.2) Pain - Right/Left: Left Pain - part of body: Hip    Time: [id number] PT Time Calculation (min) (ACUTE ONLY): 24 min   Charges:   PT Evaluation $PT Eval Moderate Complexity: 1 Mod PT Treatments $Gait Training: 8-22 mins        Zettie Cooley, DPT Acute Rehabilitation Services Secure chat preferred Office [phone]     Marguarite Arbour A [place] 05/20/2022, 9:31 AM

## 2022-05-20 NOTE — Care Management Important Message (Signed)
Important Message  Patient Details  Name: Yvonne Ballard MRN: [id number] Date of Birth: Nov 30, 1940   Medicare Important Message Given:  Yes     Shelda Altes 05/20/2022, 8:51 AM

## 2022-05-20 NOTE — Plan of Care (Signed)

## 2022-05-20 NOTE — TOC Initial Note (Signed)
Transition of Care (TOC) - Initial/Assessment Note  Marvetta Gibbons RN, BSN Transitions of Care Unit 4E- RN Case Manager See Treatment Team for direct phone #   Patient Details  Name: Yvonne Ballard MRN: [id number] Date of Birth: 09-12-1940  Transition of Care [facility] CM/SW Contact:    Dawayne Patricia, RN Phone Number: 05/20/2022, 4:36 PM  Clinical Narrative:                 Cm received call from pt's daughter Yvonne Ballard, Per TC conversation Yvonne Ballard shares that she lives and works in [place] and pt lives in [place] at [place] has apartment and needs to be independent to return to [employer] per [place]- daughter does confirm that pt can have meals delivered however there is no further assistance offered.  Pt does have a daughter who lives here, but according to [facility] her sister has mild developmental delays and is not able to provide the needed assistance that pt has. Yvonne Ballard understands that her mom is voicing that she does not want to return to SNF (has been to [facility]) however [place] feels pt is not safe to return home at this time.  Yvonne Ballard is planning on coming in next week- but will only be able to stay a week before she has to return to [place].   CM did explain to Yvonne Ballard that we can have conversations with her mom about safe discharge recommendations, however if she continues to decline going to SNF for rehab- we can not "force" her to go. She will need to also have conversations with her.   TOC to follow up, CSW to see pt regarding possible SNF placement.   Expected Discharge Plan: Skilled Nursing Facility Barriers to Discharge: Continued Medical Work up   Patient Goals and CMS Choice   CMS Medicare.gov Compare Post Acute Care list provided to:: Patient Choice offered to / list presented to : Patient, Adult Children      Expected Discharge Plan and Services In-house Referral: Clinical Social Work Discharge Planning Services: CM  Consult Post Acute Care Choice: Home Health, [place] Living arrangements for the past 2 months: Apartment                                      Prior Living Arrangements/Services Living arrangements for the past 2 months: Apartment Lives with:: Self Patient language and need for interpreter reviewed:: Yes        Need for Family Participation in Patient Care: Yes (Comment) Care giver support system in place?: No (comment) Current home services: DME Criminal Activity/Legal Involvement Pertinent to Current Situation/Hospitalization: No - Comment as needed  Activities of Daily Living Home Assistive Devices/Equipment: Cane (specify quad or straight), Eyeglasses, Walker (specify type) ADL Screening (condition at time of admission) Patient's cognitive ability adequate to safely complete daily activities?: Yes Is the patient deaf or have difficulty hearing?: No Does the patient have difficulty seeing, even when wearing glasses/contacts?: No Does the patient have difficulty concentrating, remembering, or making decisions?: No Patient able to express need for assistance with ADLs?: Yes Does the patient have difficulty dressing or bathing?: No Independently performs ADLs?: Yes (appropriate for developmental age) Does the patient have difficulty walking or climbing stairs?: Yes Weakness of Legs: Left Weakness of Arms/Hands: Left  Permission Sought/Granted                  Emotional  Assessment       Orientation: : Oriented to Self, Oriented to Place, Oriented to  Time, Oriented to Situation Alcohol / Substance Use: Not Applicable Psych Involvement: No (comment)  Admission diagnosis:  Closed fracture of left hip, initial encounter ([place]) [S72.002A] Closed displaced fracture of left femoral neck (H[facility]) [S72.002A] Patient Active Problem List   Diagnosis Date Noted   Closed displaced fracture of left femoral neck ([place]) 05/17/2022   History of CVA  (cerebrovascular accident)- 03/14/2022 05/17/2022   DNR (do not resuscitate)/DNI(Do Not Intubate) 05/17/2022   Hypokalemia 05/17/2022   CRAO (central retinal artery occlusion), right 11/15/2019   Weight loss    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HCC)    Hypertension    History of hiatal hernia    Family history of adverse reaction to anesthesia    Ascending aortic aneurysm (HCC)    Anxiety disorder    Abnormal ECG    Anxiety 02/07/2017   Hiatal hernia 02/07/2017   CAD (coronary artery disease), native coronary artery 07/26/2014   Dyslipidemia, goal LDL below 70 07/26/2014   S/P AVR (aortic valve replacement)    Aortic aneurysm, 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/2014   Benign essential HTN 03/18/2014   LVH (left ventricular hypertrophy) due to hypertensive disease 03/18/2014   Heart murmur 03/18/2014   Coronary artery disease 03/14/2014   PCP:  [name], MD Pharmacy:   [place], [place] [address] Phone: [phone] Fax: [phone]  [facility] [place], [place] - [address] AT [facility] OF [address] [place] [address] Phone: [phone] Fax: [phone]     Social Determinants of Health (SDOH) Social History: SDOH Screenings   Food Insecurity: No Food Insecurity (05/17/2022)  Housing: Low Risk  (05/17/2022)  Transportation Needs: No Transportation Needs (05/17/2022)  Utilities: Not At Risk (05/17/2022)  Tobacco Use: Low Risk  (05/20/2022)   SDOH Interventions:     Readmission Risk Interventions     No data to display

## 2022-05-20 NOTE — Evaluation (Signed)
Occupational Therapy Evaluation Patient Details Name: Yvonne Ballard MRN: [id number] DOB: Nov 22, 1940 Today's Date: 05/20/2022   History of Present Illness Patient is a 82 y/o female who presents with left femoral neck fx s/p fall at home now s/p left hip hemiarthroplasty 3/7. PMH includes CVA, HTN, HF, aortic stenosis s/p biosynthetic TAVR, CAD.   Clinical Impression   Patient admitted for the diagnosis and subsequent procedure above.  PTA she lives at a local ALF alone, and was recently walking with a cane and was able to complete her own ADL from a sit to stand level.  Patient did have a recent CVA this year impacting her L side.  The patient was undergoing PT, OT and ST at her facility.  Currently she is needing a lot of safety cues, demonstrates impulsivity and a decreased awareness of safety, placing her at a higher fall risk.  In addition she is running into objects on her right side with the RW, and does not use the RW safely.  The patient is advocating to return to her apartment with assist from a daughter flying in from [place], and resumption of her [facility] rehab.  OT is currently recommending SNF level rehab to maximize her functional safety, and ensure a safe transition back to her apartment.  However, if the daughter staying with her can provide the needed 24 hour Min A, home with HH is a possibility.  The caveat is that the daughter staying with her, can only stay for one week.  OT will continue efforts in the acute setting to address deficits, and assist with a transition to the next level of care.         Recommendations for follow up therapy are one component of a multi-disciplinary discharge planning process, led by the attending physician.  Recommendations may be updated based on patient status, additional functional criteria and insurance authorization.   Follow Up Recommendations  Other (comment) (Skilled nursing short-term rehab versus 24 hour assist as needed at home from family with  [facility] OT.)     Assistance Recommended at Discharge Frequent or constant Supervision/Assistance  Patient can return home with the following Help with stairs or ramp for entrance;A little help with bathing/dressing/bathroom;A little help with walking and/or transfers;Assist for transportation;Assistance with cooking/housework    Functional Status Assessment  Patient has had a recent decline in their functional status and demonstrates the ability to make significant improvements in function in a reasonable and predictable amount of time.  Equipment Recommendations  BSC/3in1    Recommendations for Other Services       Precautions / Restrictions Precautions Precautions: Fall Restrictions Weight Bearing Restrictions: Yes LLE Weight Bearing: Weight bearing as tolerated Other Position/Activity Restrictions: No hip precautions mentioned/ordered      Mobility Bed Mobility               General bed mobility comments: up in the recliner    Transfers Overall transfer level: Needs assistance Equipment used: Rolling walker (2 wheels) Transfers: Sit to/from Stand Sit to Stand: Min guard, Min assist                  Balance Overall balance assessment: Needs assistance Sitting-balance support: Feet supported Sitting balance-Leahy Scale: Good     Standing balance support: Reliant on assistive device for balance Standing balance-Leahy Scale: Poor                             ADL  either performed or assessed with clinical judgement   ADL Overall ADL's : Needs assistance/impaired Eating/Feeding: Independent;Sitting   Grooming: Wash/dry hands;Wash/dry face;Min guard;Standing   Upper Body Bathing: Supervision/ safety;Sitting   Lower Body Bathing: Sit to/from stand;Minimal assistance   Upper Body Dressing : Supervision/safety;Sitting   Lower Body Dressing: Sit to/from stand;Minimal assistance Lower Body Dressing Details (indicate cue type and reason): balance  support Toilet Transfer: Minimal assistance;Rolling walker (2 wheels);Regular Toilet;Ambulation                   Vision   Vision Assessment?: No apparent visual deficits Additional Comments: Patient does tend to hit/run into objects on the R side with RW     Perception     Praxis      Pertinent Vitals/Pain Pain Assessment Faces Pain Scale: Hurts a little bit Pain Location: left hip Pain Descriptors / Indicators: Tender Pain Intervention(s): Monitored during session     Hand Dominance Right   Extremity/Trunk Assessment Upper Extremity Assessment Upper Extremity Assessment: Overall WFL for tasks assessed;LUE deficits/detail LUE Deficits / Details: decreased end range for shoulder flexion - recent CVA with L hemiparesis. LUE Sensation: WNL LUE Coordination: WNL   Lower Extremity Assessment Lower Extremity Assessment: Defer to PT evaluation   Cervical / Trunk Assessment Cervical / Trunk Assessment: Kyphotic   Communication Communication Communication: No difficulties   Cognition Arousal/Alertness: Awake/alert Behavior During Therapy: WFL for tasks assessed/performed Overall Cognitive Status: Impaired/Different from baseline Area of Impairment: Safety/judgement, Memory, Following commands, Attention                   Current Attention Level: Selective, Alternating Memory: Decreased short-term memory Following Commands: Follows one step commands consistently Safety/Judgement: Decreased awareness of deficits, Decreased awareness of safety     General Comments: Patient is HOH     General Comments   HR to 137 with in room mobility    Exercises     Shoulder Instructions      Home Living Family/patient expects to be discharged to:: Assisted living                             Home Equipment: Rolling Walker (2 wheels);Tub bench;Cane - single point;Grab bars - tub/shower   Additional Comments: Has a daughter that is coming in from [place]  on Sunday and can stay withj her for the week.      Prior Functioning/Environment Prior Level of Function : Independent/Modified Independent             Mobility Comments: Using RW vs SPC ADLs Comments: Mod I for ADL and light iADL        OT Problem List: Decreased range of motion;Decreased activity tolerance;Impaired balance (sitting and/or standing);Decreased knowledge of use of DME or AE;Decreased safety awareness;Pain      OT Treatment/Interventions: Self-care/ADL training;Therapeutic activities;Balance training;Patient/family education;DME and/or AE instruction    OT Goals(Current goals can be found in the care plan section) Acute Rehab OT Goals Patient Stated Goal: Retur home OT Goal Formulation: With patient Time For Goal Achievement: 06/03/22 Potential to Achieve Goals: Good ADL Goals Pt Will Perform Grooming: with modified independence;standing Pt Will Perform Lower Body Dressing: with modified independence;sit to/from stand Pt Will Transfer to Toilet: with modified independence;ambulating;regular height toilet  OT Frequency: Min 2X/week    Co-evaluation              AM-PAC OT "6 Clicks" Daily Activity  Outcome Measure Help from another person eating meals?: None Help from another person taking care of personal grooming?: A Little Help from another person toileting, which includes using toliet, bedpan, or urinal?: A Little Help from another person bathing (including washing, rinsing, drying)?: A Little Help from another person to put on and taking off regular upper body clothing?: None Help from another person to put on and taking off regular lower body clothing?: A Little 6 Click Score: 20   End of Session Equipment Utilized During Treatment: Gait belt;Rolling walker (2 wheels) Nurse Communication: Mobility status  Activity Tolerance: Patient tolerated treatment well Patient left: in chair;with call bell/phone within reach;with family/visitor  present  OT Visit Diagnosis: Unsteadiness on feet (R26.81);History of falling (Z91.81);Pain Pain - Right/Left: Left Pain - part of body: Leg;Hip                Time: [id number] OT Time Calculation (min): 21 min Charges:  OT General Charges $OT Visit: 1 Visit OT Evaluation $OT Eval Moderate Complexity: 1 Mod  05/20/2022  RP, OTR/L  Acute Rehabilitation Services  Office:  [phone]   Metta Clines 05/20/2022, 3:38 PM

## 2022-05-20 NOTE — Progress Notes (Signed)
PROGRESS NOTE Yvonne Ballard  [id number] DOB: Dec 07, 1940 DOA: 05/17/2022 PCP: [name], MD  Brief Narrative/Hospital Course: 82 year old female history of stroke in January 2024, hypertension, hyperlipidemia, CAD, PMR admitted after mechanical fall.. Pt lives in ALF([facility]). Pt states she was walking with her walker to her bedroom to take a nap. Lost her balance and fell onto her left side.  In the ED uncontrolled hypertension 210/80 afebrile heart rate variable 55-102, on room air.  Labs with mild hypokalemia normocytic anemia 9.7.  Imaging chest x-ray unremarkable hip x-ray showed acute transverse fracture of the proximal left femoral neck with mild to moderate superolateral displacement of the distal fracture component and valgus angulation.  EKG showed NSR with PACs on admission review. Orthopedic surgery consulted and admitted.  Seen by cardiology for preop evaluation S/p left hip hemiarthroplasty by Dr Mardelle Matte 05/19/22   Subjective: Seen and examined, Overnight vitals/labs/events reviewed  Overnight she has been afebrile doing well on room air BP stable Labs shows downtrending hemoglobin 8.7 g leukocytosis resolved Pain is controlled.  Assessment and Plan: Principal Problem:   Closed displaced fracture of left femoral neck (HCC) Active Problems:   Benign essential HTN   CAD (coronary artery disease), native coronary artery   Dyslipidemia, goal LDL below 70   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HCC)   History of CVA (cerebrovascular accident)- 03/14/2022   DNR (do not resuscitate)/DNI(Do Not Intubate)   Hypokalemia   Closed displaced fracture of left femoral neck secondary to fall: Orthopedic surgery consulted and admitted.  Seen by cardiology for preop evaluation S/p left hip hemiarthroplasty by Dr Mardelle Matte 05/19/22. Continue pain control with Tylenol scheduled/as needed, hydrocodone, morphine/tramadol. Continue PT OT, DVT prophylaxis as per orthopedics>patient was supposed  on Plavix alone-patient does not prefer to be on subcu injection possibly can do aspirin/Plavix for next 4 weeks> sent message to orthopedics re their preferences for DVT prophylaxis Plan is for follow-up with orthopedics in 2-week, plan for skilled nursing facility. Ptot pending  Recent CVA 03/14/2022: per discharge summary 1/5 patient was recommended to continue aspirin Plavix x 3 weeks>followed by Plavix alone.Of note patient had refused statin.  Nausea vomiting-resolved.  Continue symptomatic management  pepcid [id number]. Dyslipidemia, goal LDL below 70:refused statin on last admit.  [id number]. 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  ABLA due to#  1 Normocytic anemia: Hemoglobin downtrending, monitor and transfuse for less than 7 g. Recent Labs  Lab 05/17/22 1732 05/17/22 1740 05/19/22 0130 05/19/22 1914 05/20/22 0125  HGB 9.7* 10.9* 9.9* 9.4* 8.7*  HCT 31.7* 32.0* 31.4* 29.4* 27.4*   GOC :DNR Verified with patient on admission  DVT prophylaxis: SCDs Start: 05/19/22 1721 SCDs Start: 05/17/22 2139 Code Status:   Code Status: DNR Family Communication: plan of care discussed with patient at bedside. Patient status is: Inpatient because of hip fracture Level of care: Telemetry Cardiac   Dispo: The patient is from: ALF            Anticipated disposition: SNF ~1 day pendign PTOT  Objective: Vitals last 24 hrs: Vitals:   05/19/22 1700 05/19/22 1730 05/19/22 2005 05/19/22 2357  BP: (!) 140/85  128/78 (!) 140/93  Pulse:  92 75  Resp: '20  18 15  '$ Temp: 97.6 F (36.4 C) 98 F (36.7 C)  97.6 F (36.4 C) 98.5 F (36.9 C)  TempSrc:  Oral Oral Oral  SpO2: 99%  93% 97%  Weight:      Height:       Weight change:   Physical Examination: General exam: AAox3, weak,older appearing HEENT:Oral mucosa moist, Ear/Nose WNL grossly, dentition normal. Respiratory system: bilaterally clear BS, no use of accessory muscle Cardiovascular system: S1 & S2 +, regular rate. Gastrointestinal system: Abdomen soft, NT,ND,BS+ Nervous System:Alert, awake, moving extremities and grossly nonfocal Extremities: LE ankle edema neg, left hip surgical site dressing + c/d/i, lower extremities warm Skin: No rashes,no icterus. MSK: Normal muscle bulk,tone, power   Medications reviewed:  Scheduled Meds:  acetaminophen  500 mg Oral Q6H   aspirin EC  81 mg Oral Daily   Chlorhexidine Gluconate Cloth  6 each Topical Q0600   clopidogrel  75 mg Oral Daily   diltiazem  120 mg Oral BID   docusate sodium  100 mg Oral BID   feeding supplement  237 mL Oral BID BM   ferrous sulfate  325 mg Oral TID PC   irbesartan  75 mg Oral BID   multivitamin with minerals  1 tablet Oral Daily   mupirocin ointment  1 Application Nasal BID   senna  1 tablet Oral BID   Continuous Infusions:  0.9 % NaCl with KCl 20 mEq / L 50 mL/hr at 05/20/22 0303   famotidine (PEPCID) IV 100 mL/hr at 05/20/22 0303    Diet Order             Diet regular Room service appropriate? Yes; Fluid consistency: Thin  Diet effective now                  Intake/Output Summary (Last 24 hours) at 05/20/2022 0753 Last data filed at 05/20/2022 0303 Gross per 24 hour  Intake 353.44 ml  Output 680 ml  Net -326.56 ml   Net IO Since Admission: -2,087.25 mL [05/20/22 0753]  Wt Readings from Last 3 Encounters:  05/19/22 44 kg  05/04/22 44.2 kg  03/15/22 43.4 kg   Unresulted Labs (From admission, onward)     Start     Ordered   05/26/22 0500  Creatinine, serum  (enoxaparin (LOVENOX)  CrCl >/= 30 mL/min  )  Weekly,   R     Comments: while on  enoxaparin therapy.   Question:  Specimen collection method  Answer:  Lab=Lab collect   05/19/22 1720   05/19/22 0500  CBC  Daily,   R      05/18/22 1108   05/19/22 [id number]  Basic metabolic panel  Daily,   R      05/18/22 1108          Data Reviewed: I have personally reviewed following labs and imaging studies CBC: Recent Labs  Lab 05/17/22 1732 05/17/22 1740 05/19/22 0130 05/19/22 1914 05/20/22 0125  WBC 6.5  --  9.4 14.2* 10.5  HGB 9.7* 10.9* 9.9* 9.4* 8.7*  HCT 31.7* 32.0* 31.4* 29.4* 27.4*  MCV 88.5  --  85.6 86.2 86.4  PLT 195  --  227 194 0000000   Basic Metabolic Panel: Recent Labs  Lab 05/17/22 1732 05/17/22 1740 05/19/22 0130 05/19/22 1914 05/20/22 0125  NA 138 138 135  --  132*  K 3.4* 3.4* 3.8  --  4.2  CL 102 100 99  --  99  CO2 24  --  25  --  26  GLUCOSE 125* 124* 133*  --  147*  BUN '12 12 19  '$ --  25*  CREATININE 0.62 0.60 0.76 0.70 0.76  CALCIUM 8.8*  --  9.1  --  8.2*   Recent Results (from the past 240 hour(s))  Surgical pcr screen     Status: Abnormal   Collection Time: 05/18/22  6:05 PM   Specimen: Nasal Mucosa; Nasal Swab  Result Value Ref Range Status   MRSA, PCR POSITIVE (A) NEGATIVE Final    Comment: RESULT CALLED TO, READ BACK BY AND VERIFIED WITH: RN NDEYA ON 05/18/22 @ 2057 BY DRT    Staphylococcus aureus POSITIVE (A) NEGATIVE Final    Comment: (NOTE) The Xpert SA Assay (FDA approved for NASAL specimens in patients 47 years of age and older), is one component of a comprehensive surveillance program. It is not intended to diagnose infection nor to guide or monitor treatment. Performed at [facility], [place] [address] [place], [place] [address]     Antimicrobials: Anti-infectives (From admission, onward)    Start     Dose/Rate Route Frequency Ordered Stop   05/19/22 2100  ceFAZolin (ANCEF) IVPB 2g/100 mL premix        2 g 200 mL/hr over 30 Minutes Intravenous Every 6 hours 05/19/22 1720 05/20/22 0530   05/19/22 1315   ceFAZolin (ANCEF) IVPB 2g/100 mL premix        2 g 200 mL/hr over 30 Minutes Intravenous On call to O.R. 05/19/22 1255 05/19/22 1419      Culture/Microbiology    Component Value Date/Time   SDES URINE, CLEAN CATCH 04/19/2014 0610   SPECREQUEST NONE 04/19/2014 0610   CULT  04/19/2014 0610    Multiple bacterial morphotypes present, none predominant. Suggest appropriate recollection if clinically indicated. Performed at [place] 04/21/2014 FINAL 04/19/2014 0610    Radiology Studies: DG Hip Port Unilat With Pelvis 1V Left  Result Date: 05/19/2022 CLINICAL DATA:  Status post left hip arthroplasty. EXAM: DG HIP (WITH OR WITHOUT PELVIS) 1V PORT LEFT COMPARISON:  May 17, 2022. FINDINGS: Status post left hip hemiarthroplasty. Expected postoperative changes are noted in the surrounding soft tissues. IMPRESSION: Status post left hip hemiarthroplasty. Electronically Signed   By: Marijo Conception M.D.   On: 05/19/2022 16:20     LOS: 3 days   Antonieta Pert, MD [facility]  05/20/2022, 7:53 AM

## 2022-05-20 NOTE — Progress Notes (Addendum)
Subjective: 1 Day Post-Op s/p Procedure(s): LEFT PARTIAL HIP ARTHROPLASTY   Patient is alert, oriented. States no pain at rest, no pain overnight.  Denies chest pain, SOB, Calf pain. No nausea/vomiting. No other complaints.    Objective:  PE: VITALS:   Vitals:   05/19/22 1700 05/19/22 1730 05/19/22 2005 05/19/22 2357  BP: (!) 140/85  128/78 (!) 140/93  Pulse:   92 75  Resp: '20  18 15  '$ Temp: 97.6 F (36.4 C) 98 F (36.7 C) 97.6 F (36.4 C) 98.5 F (36.9 C)  TempSrc:  Oral Oral Oral  SpO2: 99%  93% 97%  Weight:      Height:        ABD soft Sensation intact distally Intact pulses distally Dorsiflexion/Plantar flexion intact Incision: scant drainage  LABS  Results for orders placed or performed during the hospital encounter of 05/17/22 (from the past 24 hour(s))  CBC     Status: Abnormal   Collection Time: 05/19/22  7:14 PM  Result Value Ref Range   WBC 14.2 (H) 4.0 - 10.5 K/uL   RBC 3.41 (L) 3.87 - 5.11 MIL/uL   Hemoglobin 9.4 (L) 12.0 - 15.0 g/dL   HCT 29.4 (L) 36.0 - 46.0 %   MCV 86.2 80.0 - 100.0 fL   MCH 27.6 26.0 - 34.0 pg   MCHC 32.0 30.0 - 36.0 g/dL   RDW 14.4 11.5 - 15.5 %   Platelets 194 150 - 400 K/uL   nRBC 0.0 0.0 - 0.2 %  Creatinine, serum     Status: None   Collection Time: 05/19/22  7:14 PM  Result Value Ref Range   Creatinine, Ser 0.70 0.44 - 1.00 mg/dL   GFR, Estimated >60 >60 mL/min  CBC     Status: Abnormal   Collection Time: 05/20/22  1:25 AM  Result Value Ref Range   WBC 10.5 4.0 - 10.5 K/uL   RBC 3.17 (L) 3.87 - 5.11 MIL/uL   Hemoglobin 8.7 (L) 12.0 - 15.0 g/dL   HCT 27.4 (L) 36.0 - 46.0 %   MCV 86.4 80.0 - 100.0 fL   MCH 27.4 26.0 - 34.0 pg   MCHC 31.8 30.0 - 36.0 g/dL   RDW 14.3 11.5 - 15.5 %   Platelets 181 150 - 400 K/uL   nRBC 0.0 0.0 - 0.2 %  Basic metabolic panel     Status: Abnormal   Collection Time: 05/20/22  1:25 AM  Result Value Ref Range   Sodium 132 (L) 135 - 145 mmol/L   Potassium 4.2 3.5 - 5.1 mmol/L    Chloride 99 98 - 111 mmol/L   CO2 26 22 - 32 mmol/L   Glucose, Bld 147 (H) 70 - 99 mg/dL   BUN 25 (H) 8 - 23 mg/dL   Creatinine, Ser 0.76 0.44 - 1.00 mg/dL   Calcium 8.2 (L) 8.9 - 10.3 mg/dL   GFR, Estimated >60 >60 mL/min   Anion gap 7 5 - 15    DG Hip Port Unilat With Pelvis 1V Left  Result Date: 05/19/2022 CLINICAL DATA:  Status post left hip arthroplasty. EXAM: DG HIP (WITH OR WITHOUT PELVIS) 1V PORT LEFT COMPARISON:  May 17, 2022. FINDINGS: Status post left hip hemiarthroplasty. Expected postoperative changes are noted in the surrounding soft tissues. IMPRESSION: Status post left hip hemiarthroplasty. Electronically Signed   By: Marijo Conception M.D.   On: 05/19/2022 16:20    Assessment/Plan: Principal Problem:   Closed displaced fracture  of left femoral neck ([place]) Active Problems:   Benign essential HTN   CAD (coronary artery disease), native coronary artery   Dyslipidemia, goal LDL below 70   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HCC)   History of CVA (cerebrovascular accident)- 03/14/2022   DNR (do not resuscitate)/DNI(Do Not Intubate)   Hypokalemia   1 Day Post-Op s/p Procedure(s): LEFT PARTIAL HIP ARTHROPLASTY  Weightbearing: WBAT LLE Insicional and dressing care: Reinforce dressings as needed VTE prophylaxis: Will plan to restart her baseline ASA and plavix. Per original medicine H&P, at baseline she is on ASA 81 mg daily and plavix 75 mg daily with plan to stop her aspirin. Pain control: continue current regimen Follow - up plan: 2 weeks with Dr. Mardelle Matte Dispo: will likely need SNF or SNF side of her independent living facility, pending PT eval today  Contact information:   Yvonne Pulling, PA-C Weekdays 8-5  After hours and holidays please check [email or web address] for group call information for [facility]  [name] 05/20/2022, 7:37 AM

## 2022-05-21 DIAGNOSIS — S72002A Fracture of unspecified part of neck of left femur, initial encounter for closed fracture: Secondary | ICD-10-CM | POA: Diagnosis not present

## 2022-05-21 LAB — CBC
HCT: 25.7 % — ABNORMAL LOW (ref 36.0–46.0)
Hemoglobin: 8.1 g/dL — ABNORMAL LOW (ref 12.0–15.0)
MCH: 26.9 pg (ref 26.0–34.0)
MCHC: 31.5 g/dL (ref 30.0–36.0)
MCV: 85.4 fL (ref 80.0–100.0)
Platelets: 143 10*3/uL — ABNORMAL LOW (ref 150–400)
RBC: 3.01 MIL/uL — ABNORMAL LOW (ref 3.87–5.11)
RDW: 14.5 % (ref 11.5–15.5)
WBC: 7.6 10*3/uL (ref 4.0–10.5)
nRBC: 0 % (ref 0.0–0.2)

## 2022-05-21 LAB — BASIC METABOLIC PANEL
Anion gap: 7 (ref 5–15)
BUN: 19 mg/dL (ref 8–23)
CO2: 27 mmol/L (ref 22–32)
Calcium: 8.1 mg/dL — ABNORMAL LOW (ref 8.9–10.3)
Chloride: 104 mmol/L (ref 98–111)
Creatinine, Ser: 0.6 mg/dL (ref 0.44–1.00)
GFR, Estimated: >60 mL/min (ref >60)
Glucose, Bld: 104 mg/dL — ABNORMAL HIGH (ref 70–99)
Potassium: 3.3 mmol/L — ABNORMAL LOW (ref 3.5–5.1)
Sodium: 138 mmol/L (ref 135–145)

## 2022-05-21 MED ORDER — POTASSIUM CHLORIDE CRYS ER 20 MEQ PO TBCR
40.0000 meq | EXTENDED_RELEASE_TABLET | Freq: Once | ORAL | Status: AC
  Administered 2022-05-21: 40 meq via ORAL
  Filled 2022-05-21: qty 2

## 2022-05-21 MED ORDER — FAMOTIDINE 20 MG PO TABS
20.0000 mg | ORAL_TABLET | Freq: Every day | ORAL | Status: DC
  Administered 2022-05-22 – 2022-05-23 (×2): 20 mg via ORAL
  Filled 2022-05-21 (×3): qty 1

## 2022-05-21 MED ORDER — DILTIAZEM HCL 30 MG PO TABS: 30.0000 mg | ORAL_TABLET | Freq: Once | ORAL | Status: DC

## 2022-05-21 MED ORDER — DILTIAZEM HCL 30 MG PO TABS
30.0000 mg | ORAL_TABLET | Freq: Once | ORAL | Status: AC
  Administered 2022-05-21: 30 mg via ORAL
  Filled 2022-05-21: qty 1

## 2022-05-21 NOTE — Progress Notes (Signed)
CCMD notified about patient irregular heart rate,EKG done ,vitals taken, MD notified

## 2022-05-21 NOTE — Progress Notes (Signed)
Rounding Note    Patient Name: Yvonne Ballard Date of Encounter: 05/21/2022  [place] Cardiologist: Peter Martinique, MD   Subjective   Patient admitted for L hip hemiarthorplasty for which cardiology was asked to see initially for cardiac surgical risk assessment. We have now been re-engaged today due to patient developing tachy-arrhythmias.   Patient appears to be quite anxious this morning. She expresses frustration with aspects of her care and blames this for elevated HR. Denies chest pain, palpitations, or shortness of breath. She is "ready to get home."  Inpatient Medications    Scheduled Meds:  aspirin EC  81 mg Oral Daily   Chlorhexidine Gluconate Cloth  6 each Topical Q0600   clopidogrel  75 mg Oral Daily   diltiazem  120 mg Oral BID   docusate sodium  100 mg Oral BID   feeding supplement  237 mL Oral BID BM   ferrous sulfate  325 mg Oral TID PC   irbesartan  75 mg Oral BID   multivitamin with minerals  1 tablet Oral Daily   mupirocin ointment  1 Application Nasal BID   senna  1 tablet Oral BID   Continuous Infusions:  famotidine (PEPCID) IV 20 mg (05/21/22 1142)   PRN Meds: acetaminophen, alum & mag hydroxide-simeth, bisacodyl, hydrALAZINE, HYDROcodone-acetaminophen, magnesium citrate, menthol-cetylpyridinium **OR** phenol, morphine injection, ondansetron **OR** ondansetron (ZOFRAN) IV, polyethylene glycol, traMADol   Vital Signs    Vitals:   05/21/22 0325 05/21/22 0817 05/21/22 0947 05/21/22 1143  BP: (!) 144/78 (!) 151/96 131/75 (!) 120/95  Pulse: 71  (!) 131 (!) 108  Resp: '18 18  15  '$ Temp: 98.6 F (37 C) 99 F (37.2 C)  98.4 F (36.9 C)  TempSrc: Oral Oral  Oral  SpO2: 98%  92% 94%  Weight:      Height:       No intake or output data in the 24 hours ending 05/21/22 1343    05/19/2022   12:37 PM 05/17/2022    5:22 PM 05/04/2022    3:33 PM  Last 3 Weights  Weight (lbs) 97 lb 97 lb 6.4 oz 97 lb 6.4 oz  Weight (kg) 43.999 kg 44.18 kg 44.18 kg       Telemetry    Sinus arrhythmia with frequent PACs. LAFB/RBBB pattern - Personally Reviewed  ECG    Sinus arrhythmia with frequent PACs. LAFB/RBBB - Personally Reviewed  Physical Exam   GEN: No acute distress.   Neck: No JVD Cardiac: rapid and irregularly irregular rhythm. No murmur noted Respiratory: Clear to auscultation bilaterally, inspiratory pleural rub GI: Soft, nontender, non-distended  MS: No edema; No deformity. Neuro:  Nonfocal  Psych: Anxious  Labs    High Sensitivity Troponin:  No results for input(s): "TROPONINIHS" in the last 720 hours.   Chemistry Recent Labs  Lab 05/19/22 0130 05/19/22 1914 05/20/22 0125 05/21/22 0125  NA 135  --  132* 138  K 3.8  --  4.2 3.3*  CL 99  --  99 104  CO2 25  --  26 27  GLUCOSE 133*  --  147* 104*  BUN 19  --  25* 19  CREATININE 0.76 0.70 0.76 0.60  CALCIUM 9.1  --  8.2* 8.1*  GFRNONAA >60 >60 >60 >60  ANIONGAP 11  --  7 7    Lipids No results for input(s): "CHOL", "TRIG", "HDL", "LABVLDL", "LDLCALC", "CHOLHDL" in the last 168 hours.  Hematology Recent Labs  Lab 05/19/22 1914 05/20/22 0125  05/21/22 0125  WBC 14.2* 10.5 7.6  RBC 3.41* 3.17* 3.01*  HGB 9.4* 8.7* 8.1*  HCT 29.4* 27.4* 25.7*  MCV 86.2 86.4 85.4  MCH 27.6 27.4 26.9  MCHC 32.0 31.8 31.5  RDW 14.4 14.3 14.5  PLT 194 181 143*   Thyroid No results for input(s): "TSH", "FREET4" in the last 168 hours.  BNPNo results for input(s): "BNP", "PROBNP" in the last 168 hours.  DDimer No results for input(s): "DDIMER" in the last 168 hours.   Radiology    DG Hip Port Unilat With Pelvis 1V Left  Result Date: 05/19/2022 CLINICAL DATA:  Status post left hip arthroplasty. EXAM: DG HIP (WITH OR WITHOUT PELVIS) 1V PORT LEFT COMPARISON:  May 17, 2022. FINDINGS: Status post left hip hemiarthroplasty. Expected postoperative changes are noted in the surrounding soft tissues. IMPRESSION: Status post left hip hemiarthroplasty. Electronically Signed   By: Marijo Conception M.D.   On: 05/19/2022 16:20    Cardiac Studies   03/17/22 TTE  IMPRESSIONS     1. Left ventricular ejection fraction, by estimation, is 55 to 60%. The  left ventricle has normal function. The left ventricle demonstrates  regional wall motion abnormalities (see scoring diagram/findings for  description). There is mild concentric left  ventricular hypertrophy. Left ventricular diastolic parameters are  consistent with Grade II diastolic dysfunction (pseudonormalization).  Elevated left atrial pressure.   2. Right ventricular systolic function is mildly reduced. The right  ventricular size is normal. There is normal pulmonary artery systolic  pressure. The estimated right ventricular systolic pressure is Q000111Q mmHg.   3. Left atrial size was mildly dilated.   4. The mitral valve is myxomatous. Mild mitral valve regurgitation.   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   6. The aortic valve has been repaired/replaced. Aortic valve  regurgitation is not visualized. No aortic stenosis is present. There is a  valve present in the aortic position. Echo findings are consistent with  normal structure and function of the aortic  valve prosthesis. Aortic valve mean gradient measures 11.0 mmHg. Aortic  valve Vmax measures 2.31 m/s. Aortic valve acceleration time measures 49  msec.   7. Aortic root/ascending aorta has been repaired/replaced and dilatation  noted. 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   Comparison(s): No significant change from prior study. Prior images  reviewed side by side.   FINDINGS   Left Ventricle: Left ventricular ejection fraction, by estimation, is 55  to 60%. The left ventricle has normal function. The left ventricle  demonstrates regional wall motion abnormalities. The left ventricular  internal cavity size was normal in size.  There is mild concentric left ventricular  hypertrophy. Abnormal  (paradoxical) septal motion consistent with post-operative status. Left  ventricular diastolic parameters are consistent with Grade II diastolic  dysfunction (pseudonormalization). Elevated  left atrial pressure.   Right Ventricle: The right ventricular size is normal. No increase in  right ventricular wall thickness. Right ventricular systolic function is  mildly reduced. There is normal pulmonary artery systolic pressure. The  tricuspid regurgitant velocity is 2.33  m/s, and with an assumed right atrial pressure of 3 mmHg, the estimated  right ventricular systolic pressure is Q000111Q mmHg.   Left Atrium: Left atrial size was mildly dilated.   Right Atrium: Right atrial size was normal in size. Prominent Chiari  network.   Pericardium: There is no evidence of pericardial effusion.   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   Tricuspid Valve: The tricuspid valve is myxomatous. Tricuspid valve  regurgitation is mild to moderate. 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   Aortic Valve: The aortic valve has been repaired/replaced. Aortic valve  regurgitation is not visualized. No aortic stenosis is present. Aortic  valve mean gradient measures 11.0 mmHg. Aortic valve peak gradient  measures 21.3 mmHg. Aortic valve area, by  VTI measures 1.40 cm. There is a valve present in the aortic position.  Echo findings are consistent with normal structure and function of the  aortic valve prosthesis.   Pulmonic Valve: The pulmonic valve was grossly normal. Pulmonic valve  regurgitation is not visualized.   Aorta: The aortic root and ascending aorta are structurally normal, with  no evidence of dilitation, the aortic root/ascending aorta has been  repaired/replaced and aortic dilatation noted. There is moderate  dilatation of the descending aorta, measuring 39   mm.   IAS/Shunts: No atrial level shunt detected by  color flow Doppler.   Patient Profile     Vallory Pilgrim is a 82 y.o. female with a hx of diastolic dysfunction without heart failure, aortic stenosis status post AVR, ascending aortic aneurysm (6.5cm), mild nonobstructive CAD, CVA, HTN, HLD, PMR, who is being seen as a re-round on 3/9 at the request of Dr. Lupita Leash for tachy-arrhythmia.  Assessment & Plan    Tachy-arrhythmia PACs  Patient admitted 2/2 fall with left hip fracture. Now with irregular HR, >100bpm this morning. ECG and telemetry appear to show sinus arrhythmia with frequent PACs. No obvious atrial fibrillation.   Suspect tachy-arrhythmia is largely a result of pain/peri-surgical status Patient already takes Cardizem CD '120mg'$  BID, agree with primary team, will give additional '30mg'$  standard release tablet for additional rate control. If positive response with stable BP, could consider increase evening long-acting dose. Metoprolol would be next preferred rate control agent, though patient historically intolerant due to fatigue.  Continue to monitor telemetry closely, as patient does have risk factors for developing atrial fibrillation as well as conduction disease (underlying RBBB/LAFB).   Hypertension  Patient's BP generally at goal/baseline. Will need to watch closely with increased Cardizem today. Continue Irbesartan.  Nonobstructive CAD Hyperlipidemia  Patient with hx non-obstructive CAD found with SAVR workup. No anginal symptoms this admission. Patient not on medical therapy, expresses desire for lifestyle management.  Hx cardiomyopathy, EF now normalized post SAVR  LVEF 55-60% as of 03/15/22 TTE. No evidence of volume overload on exam.   CVA  Patient with history of acute CVA in January 2024. Discharge instructions were for DAPT x3 weeks but appears that patient was only taking aspirin "as needed" and no plavix at all.   Anti-platelet regimen per primary team/neurology      For questions or updates, please contact [facility] Please consult [email or web address] for contact info under        Signed, Werner Lean, MD  05/21/2022, 1:43 PM    Personally seen and examined. Agree with APP above with the following comments: Patient notes that she feels fine. She is worried about her improvement from surgery; she did better after her prior stroke. (03/2022). She has no palptiations. On telemetry she has frequent PACs.  I have seen small runs of SVT.  I have not seen Atrial fibrillation. She does not like to take medications.  She is amenable to increase Cardizem.  If low BP stop ARB.  Rudean Haskell,  MD [facility], [address] [place], [place] [address] [phone]  1:43 PM

## 2022-05-21 NOTE — Progress Notes (Signed)
PROGRESS NOTE Yvonne Ballard  [id number] DOB: 16-Mar-1940 DOA: 05/17/2022 PCP: [name], MD  Brief Narrative/Hospital Course: 82 year old female history of stroke in January 2024, hypertension, hyperlipidemia, CAD, PMR admitted after mechanical fall.. Pt lives in ALF([facility]). Pt states she was walking with her walker to her bedroom to take a nap. Lost her balance and fell onto her left side.  In the ED uncontrolled hypertension 210/80 afebrile heart rate variable 55-102, on room air.  Labs with mild hypokalemia normocytic anemia 9.7.  Imaging chest x-ray unremarkable hip x-ray showed acute transverse fracture of the proximal left femoral neck with mild to moderate superolateral displacement of the distal fracture component and valgus angulation.  EKG showed NSR with PACs on admission review. Orthopedic surgery consulted and admitted.  Seen by cardiology for preop evaluation S/p left hip hemiarthroplasty by Dr Mardelle Matte 05/19/22   Subjective: Seen and examined, Overnight vitals/labs/events reviewed  Patient was resting comfortably, no new complaints Later on started have heart rate in 120s to 150s EKG showing A-fib with RVR-Ortho symptomatic  Assessment and Plan: Principal Problem:   Closed displaced fracture of left femoral neck (HCC) Active Problems:   Benign essential HTN   CAD (coronary artery disease), native coronary artery   Dyslipidemia, goal LDL below 70   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[place])   History of CVA (cerebrovascular accident)- 03/14/2022   DNR (do not resuscitate)/DNI(Do Not Intubate)   Hypokalemia   Closed displaced fracture of left femoral neck secondary to fall: Seen by cardiology for preop evaluation. S/p left hip hemiarthroplasty by Dr Mardelle Matte 05/19/22.  Pain control continue with current regimen  - Tylenol scheduled/as needed, hydrocodone, morphine/tramadol. Continue PT OT, DVT prophylaxis as per orthopedics: Continue aspirin Plavix x 4 weeks followed with  Plavix alone. She was supposed on Plavix alone from recent stroke-patient does not prefer to be on subcu injection   Recent CVA 03/14/2022: per discharge summary 1/5 patient was recommended to continue aspirin Plavix x 3 weeks>followed by Plavix alone.she tells me she has not been very compliant with her antiblood  Ofnote patient had refused statin on last discahrge.  History of tachycardia with PAC,EKG 3/9 [id number] for possible A-fib with RVR vs sinus arrhythmia with PAC, previously-given recurrent episode despite being on Cardizem will consult cardiology.   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  Nausea vomiting-resolved.  [id number]. Dyslipidemia, goal LDL below 70:refused statin on last admit.  [id number]. Benign essential HTN: BP well-controlled , UNcontrolled on admission likely from pain on avaprol/cardizem Hypokalemia: Replete  ABLA due to#  1 Normocytic anemia: Hemoglobin slowly downtrending>monitor and transfuse <7 g. Recent Labs  Lab 05/17/22 1740 05/19/22 0130 05/19/22 1914 05/20/22 0125 05/21/22 0125  HGB 10.9* 9.9* 9.4* 8.7* 8.1*  HCT 32.0* 31.4* 29.4* 27.4* 25.7*    GOC :DNR Verified with patient on admission  DVT prophylaxis: SCDs Start: 05/19/22 1721 SCDs Start: 05/17/22 2139 Code Status:   Code Status: DNR Family Communication: plan of care discussed with patient at bedside. Patient status is: Inpatient because of hip fracture Level of care: Telemetry Cardiac   Dispo: The patient is from: ALF            Anticipated disposition: SNF pendinG   Objective: Vitals last 24 hrs: Vitals:   05/20/22 2331 05/21/22 0325 05/21/22 0817 05/21/22 0947  BP: (!) 160/79 (!) 144/78 Marland Kitchen)  151/96 131/75  Pulse: 94 71    Resp: '18 18 18   '$ Temp: 98.7 F (37.1 C) 98.6 F (37 C) 99 F  (37.2 C)   TempSrc: Oral Oral Oral   SpO2: 98% 98%    Weight:      Height:       Weight change:   Physical Examination: General exam: alert awake, pleasant, elderly HEENT:Oral mucosa moist, Ear/Nose WNL grossly Respiratory system: Bilaterally clear BS, no use of accessory muscle Cardiovascular system: S1 & S2 +, No JVD. Gastrointestinal system: Abdomen soft,NT,ND, BS+ Nervous System: Alert, awake, moving extremities, she follows commands. Extremities: LE edema neg, left hip w/ surgical site Aquacel dressing intact with bruise,distal peripheral pulses palpable.  Skin: No rashes,no icterus. MSK: Normal muscle bulk,tone, power   Medications reviewed:  Scheduled Meds:  aspirin EC  81 mg Oral Daily   Chlorhexidine Gluconate Cloth  6 each Topical Q0600   clopidogrel  75 mg Oral Daily   diltiazem  120 mg Oral BID   diltiazem  30 mg Oral Once   docusate sodium  100 mg Oral BID   feeding supplement  237 mL Oral BID BM   ferrous sulfate  325 mg Oral TID PC   irbesartan  75 mg Oral BID   multivitamin with minerals  1 tablet Oral Daily   mupirocin ointment  1 Application Nasal BID   senna  1 tablet Oral BID   Continuous Infusions:  famotidine (PEPCID) IV 100 mL/hr at 05/20/22 0303    Diet Order             Diet regular Room service appropriate? Yes; Fluid consistency: Thin  Diet effective now                 No intake or output data in the 24 hours ending 05/21/22 1029  Net IO Since Admission: -2,087.25 mL [05/21/22 1029]  Wt Readings from Last 3 Encounters:  05/19/22 44 kg  05/04/22 44.2 kg  03/15/22 43.4 kg   Unresulted Labs (From admission, onward)     Start     Ordered   05/26/22 0500  Creatinine, serum  (enoxaparin (LOVENOX)  CrCl >/= 30 mL/min  )  Weekly,   R     Comments: while on enoxaparin therapy.   Question:  Specimen collection method  Answer:  Lab=Lab collect   05/19/22 1720   05/19/22 0500  CBC  Daily,   R      05/18/22 1108   05/19/22 [id number]  Basic  metabolic panel  Daily,   R      05/18/22 1108          Data Reviewed: I have personally reviewed following labs and imaging studies CBC: Recent Labs  Lab 05/17/22 1732 05/17/22 1740 05/19/22 0130 05/19/22 1914 05/20/22 0125 05/21/22 0125  WBC 6.5  --  9.4 14.2* 10.5 7.6  HGB 9.7* 10.9* 9.9* 9.4* 8.7* 8.1*  HCT 31.7* 32.0* 31.4* 29.4* 27.4* 25.7*  MCV 88.5  --  85.6 86.2 86.4 85.4  PLT 195  --  227 194 181 143*    Basic Metabolic Panel: Recent Labs  Lab 05/17/22 1732 05/17/22 1740 05/19/22 0130 05/19/22 1914 05/20/22 0125 05/21/22 0125  NA 138 138 135  --  132* 138  K 3.4* 3.4* 3.8  --  4.2 3.3*  CL 102 100 99  --  99 104  CO2 24  --  25  --  26 27  GLUCOSE 125* 124* 133*  --  147* 104*  BUN '12 12 19  '$ --  25* 19  CREATININE 0.62 0.60 0.76 0.70 0.76 0.60  CALCIUM 8.8*  --  9.1  --  8.2* 8.1*    Recent Results (from the past 240 hour(s))  Surgical pcr screen     Status: Abnormal   Collection Time: 05/18/22  6:05 PM   Specimen: Nasal Mucosa; Nasal Swab  Result Value Ref Range Status   MRSA, PCR POSITIVE (A) NEGATIVE Final    Comment: RESULT CALLED TO, READ BACK BY AND VERIFIED WITH: RN NDEYA ON 05/18/22 @ 2057 BY DRT    Staphylococcus aureus POSITIVE (A) NEGATIVE Final    Comment: (NOTE) The Xpert SA Assay (FDA approved for NASAL specimens in patients 65 years of age and older), is one component of a comprehensive surveillance program. It is not intended to diagnose infection nor to guide or monitor treatment. Performed at [facility], [place] [address], [place], [place] [address]     Antimicrobials: Anti-infectives (From admission, onward)    Start     Dose/Rate Route Frequency Ordered Stop   05/19/22 2100  ceFAZolin (ANCEF) IVPB 2g/100 mL premix        2 g 200 mL/hr over 30 Minutes Intravenous Every 6 hours 05/19/22 1720 05/20/22 0530   05/19/22 1315  ceFAZolin (ANCEF) IVPB 2g/100 mL premix        2 g 200 mL/hr over 30 Minutes Intravenous On  call to O.R. 05/19/22 1255 05/19/22 1419      Culture/Microbiology    Component Value Date/Time   SDES URINE, CLEAN CATCH 04/19/2014 0610   SPECREQUEST NONE 04/19/2014 0610   CULT  04/19/2014 0610    Multiple bacterial morphotypes present, none predominant. Suggest appropriate recollection if clinically indicated. Performed at [place] 04/21/2014 FINAL 04/19/2014 0610    Radiology Studies: DG Hip Port Unilat With Pelvis 1V Left  Result Date: 05/19/2022 CLINICAL DATA:  Status post left hip arthroplasty. EXAM: DG HIP (WITH OR WITHOUT PELVIS) 1V PORT LEFT COMPARISON:  May 17, 2022. FINDINGS: Status post left hip hemiarthroplasty. Expected postoperative changes are noted in the surrounding soft tissues. IMPRESSION: Status post left hip hemiarthroplasty. Electronically Signed   By: Marijo Conception M.D.   On: 05/19/2022 16:20     LOS: 4 days   Antonieta Pert, MD [facility]  05/21/2022, 10:29 AM

## 2022-05-21 NOTE — Progress Notes (Signed)
Subjective: 2 Days Post-Op s/p Procedure(s): LEFT PARTIAL HIP ARTHROPLASTY   Patient is alert, oriented. States no pain at rest, no pain overnight. She worked very well with therapy yesterday.   Denies chest pain, SOB, Calf pain. No nausea/vomiting. No other complaints.    Objective:  PE: VITALS:   Vitals:   05/20/22 2331 05/21/22 0325 05/21/22 0817 05/21/22 0947  BP: (!) 160/79 (!) 144/78 (!) 151/96 131/75  Pulse: 94 71    Resp: '18 18 18   '$ Temp: 98.7 F (37.1 C) 98.6 F (37 C) 99 F (37.2 C)   TempSrc: Oral Oral Oral   SpO2: 98% 98%    Weight:      Height:        ABD soft Sensation intact distally Intact pulses distally Dorsiflexion/Plantar flexion intact Incision: scant drainage dressing removed. Incision CDI. New mepilex bandage placed.   LABS  Results for orders placed or performed during the hospital encounter of 05/17/22 (from the past 24 hour(s))  CBC     Status: Abnormal   Collection Time: 05/21/22  1:25 AM  Result Value Ref Range   WBC 7.6 4.0 - 10.5 K/uL   RBC 3.01 (L) 3.87 - 5.11 MIL/uL   Hemoglobin 8.1 (L) 12.0 - 15.0 g/dL   HCT 25.7 (L) 36.0 - 46.0 %   MCV 85.4 80.0 - 100.0 fL   MCH 26.9 26.0 - 34.0 pg   MCHC 31.5 30.0 - 36.0 g/dL   RDW 14.5 11.5 - 15.5 %   Platelets 143 (L) 150 - 400 K/uL   nRBC 0.0 0.0 - 0.2 %  Basic metabolic panel     Status: Abnormal   Collection Time: 05/21/22  1:25 AM  Result Value Ref Range   Sodium 138 135 - 145 mmol/L   Potassium 3.3 (L) 3.5 - 5.1 mmol/L   Chloride 104 98 - 111 mmol/L   CO2 27 22 - 32 mmol/L   Glucose, Bld 104 (H) 70 - 99 mg/dL   BUN 19 8 - 23 mg/dL   Creatinine, Ser 0.60 0.44 - 1.00 mg/dL   Calcium 8.1 (L) 8.9 - 10.3 mg/dL   GFR, Estimated >60 >60 mL/min   Anion gap 7 5 - 15    DG Hip Port Unilat With Pelvis 1V Left  Result Date: 05/19/2022 CLINICAL DATA:  Status post left hip arthroplasty. EXAM: DG HIP (WITH OR WITHOUT PELVIS) 1V PORT LEFT COMPARISON:  May 17, 2022. FINDINGS: Status  post left hip hemiarthroplasty. Expected postoperative changes are noted in the surrounding soft tissues. IMPRESSION: Status post left hip hemiarthroplasty. Electronically Signed   By: Marijo Conception M.D.   On: 05/19/2022 16:20    Assessment/Plan: Principal Problem:   Closed displaced fracture of left femoral neck (HCC) Active Problems:   Benign essential HTN   CAD (coronary artery disease), native coronary artery   Dyslipidemia, goal LDL below 70   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HCC)   History of CVA (cerebrovascular accident)- 03/14/2022   DNR (do not resuscitate)/DNI(Do Not Intubate)   Hypokalemia   2 Days Post-Op s/p Procedure(s): LEFT PARTIAL HIP ARTHROPLASTY  Weightbearing: WBAT LLE Insicional and dressing care: Reinforce dressings as needed New dressing placed on 3/9. VTE prophylaxis: Will plan to restart her baseline ASA and plavix. Per original medicine H&P, at baseline she is on ASA 81 mg daily and plavix 75 mg daily with plan to stop her aspirin. Pain control: continue current regimen Follow - up plan: 2 weeks with  Dr. Mardelle Matte Dispo: PT recommending SNF.   Contact information:   After hours and holidays please check [email or web address] for group call information for [facility] [place] 05/21/2022, 10:40 AM

## 2022-05-21 NOTE — TOC Progression Note (Signed)
Transition of Care [facility]) - Progression Note    Patient Details  Name: Yvonne Ballard MRN: [id number] Date of Birth: 01-11-1941  Transition [facility] CM/SW [place], LCSW Phone Number: 05/21/2022, 1:23 PM  Clinical Narrative:    CSW met with patient regarding discharge plan. Patient shared that she has a hard decision to make regarding SNF versus returning home. She shared frustrations with nursing staff availability here and stated she could do that at home by herself. She shared her IL's therapy department is across from her apartment and that meals can be delivered to her room. She stated she wished she had taken her prior stroke more seriously so that she would not have fallen leading up to this admission. CSW pointed out that SNF also would have more medial staff available for patient that her IL would not have but she stated staff doesn't come check on her at SNF either. She asked for list of SNFs and CSW provided one to her with Medicare ratings. She stated she will discuss plan when her daughter arrives tomorrow but that she would like to try and convince her daughter to let her return to her IL.    Expected Discharge Plan: [place] Barriers to Discharge: Continued Medical Work up  Expected Discharge Plan and Services In-house Referral: Clinical Social Work Discharge Planning Services: CM Consult Post Acute Care Choice: Home Health, [place] Living arrangements for the past 2 months: Apartment                                       Social Determinants of Health (SDOH) Interventions SDOH Screenings   Food Insecurity: No Food Insecurity (05/17/2022)  Housing: Low Risk  (05/17/2022)  Transportation Needs: No Transportation Needs (05/17/2022)  Utilities: Not At Risk (05/17/2022)  Tobacco Use: Low Risk  (05/20/2022)    Readmission Risk Interventions     No data to display

## 2022-05-21 NOTE — Progress Notes (Signed)
Physical Therapy Treatment Patient Details Name: Yvonne Ballard MRN: [id number] DOB: [birth year] Today's Date: 05/21/2022   History of Present Illness Patient is a 82 y/o female who presents with left femoral neck fx s/p fall at home now s/p left hip hemiarthroplasty 3/7. 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    PT Comments    Pt with erratic HR throughout session. Was very fatigued after gait.  Of note, pt had walked herself to the bathroom and back not long before PT session.  Pt reports her daughter is flying in later tonight and should be at the hospital tomorrow. Will try to have a PT session tomorrow when daughter is present so pt and daughter can further discuss what the best DC plan is. Pt would still like to avoid a SNF stay is possible.       Recommendations for follow up therapy are one component of a multi-disciplinary discharge planning process, led by the attending physician.  Recommendations may be updated based on patient status, additional functional criteria and insurance authorization.  Follow Up Recommendations  Skilled nursing-short term rehab (<3 hours/day) Can patient physically be transported by private vehicle: Yes   Assistance Recommended at Discharge Intermittent Supervision/Assistance  Patient can return home with the following A little help with walking and/or transfers;A little help with bathing/dressing/bathroom   Equipment Recommendations       Recommendations for Other Services       Precautions / Restrictions Precautions Precautions: Fall Restrictions Weight Bearing Restrictions: Yes LLE Weight Bearing: Weight bearing as tolerated Other Position/Activity Restrictions: No hip precautions mentioned/ordered     Mobility  Bed Mobility Overal bed mobility:  (Pt up in chair.)                  Transfers Overall transfer level: Needs assistance Equipment used: Rolling walker (2 wheels) Transfers:  Sit to/from Stand Sit to Stand: Min guard           General transfer comment: VC's for safest technique    Ambulation/Gait Ambulation/Gait assistance: Min guard Gait Distance (Feet): 30 Feet Assistive device: Rolling walker (2 wheels) Gait Pattern/deviations: Step-to pattern, Decreased stance time - left, Decreased step length - right, Trunk flexed Gait velocity: decreased     General Gait Details: pt with difficulty getting left heel to ground during stance phase. Pt took one standing rest break   Marine scientist [place] (Stroke Patients Only)       Balance Overall balance assessment: History of Falls         Standing balance support: Reliant on assistive device for balance                                Cognition Arousal/Alertness: Awake/alert Behavior During Therapy: WFL for tasks assessed/performed Overall Cognitive Status: Within Functional Limits for tasks assessed                                          Exercises Total Joint Exercises Ankle Circles/Pumps: AROM, Both, 10 reps, Supine Heel Slides: AAROM, Left, 10 reps, Supine (in recliner) Hip ABduction/ADduction: AAROM, Left, 10 reps, Supine (in recliner) Long Arc Quad: AROM, Left, 10 reps, Seated    General  Comments General comments (skin integrity, edema, etc.): HR erratic during session. RN aware and was providing medications      Pertinent Vitals/Pain Pain Assessment Pain Assessment: 0-10 Pain Score: 2  Pain Location: left hip Pain Descriptors / Indicators: Tender Pain Intervention(s): Limited activity within patient's tolerance, Monitored during session    Home Living                          Prior Function            PT Goals (current goals can now be found in the care plan section) Progress towards PT goals: Progressing toward goals    Frequency    Min 4X/week      PT Plan Current plan  remains appropriate    Co-evaluation              AM-PAC PT "6 Clicks" Mobility   Outcome Measure  Help needed turning from your back to your side while in a flat bed without using bedrails?: A Little Help needed moving from lying on your back to sitting on the side of a flat bed without using bedrails?: A Little Help needed moving to and from a bed to a chair (including a wheelchair)?: A Little Help needed standing up from a chair using your arms (e.g., wheelchair or bedside chair)?: A Little Help needed to walk in hospital room?: A Little Help needed climbing 3-5 steps with a railing? : A Lot 6 Click Score: 17    End of Session Equipment Utilized During Treatment: Gait belt Activity Tolerance: Patient tolerated treatment well Patient left: in chair;with call bell/phone within reach;with chair alarm set;with nursing/sitter in room Nurse Communication: Mobility status PT Visit Diagnosis: Difficulty in walking, not elsewhere classified (R26.2)     Time: [id number] PT Time Calculation (min) (ACUTE ONLY): 36 min  Charges:  $Gait Training: 8-22 mins $Therapeutic Exercise: 8-22 mins                     Lavonia Dana, PT   Acute Rehabilitation Services  Office [phone] 05/21/2022    Melvern Banker 05/21/2022, 12:40 PM

## 2022-05-22 DIAGNOSIS — S72002A Fracture of unspecified part of neck of left femur, initial encounter for closed fracture: Secondary | ICD-10-CM | POA: Diagnosis not present

## 2022-05-22 LAB — CBC
HCT: 23.5 % — ABNORMAL LOW (ref 36.0–46.0)
Hemoglobin: 7.4 g/dL — ABNORMAL LOW (ref 12.0–15.0)
MCH: 27 pg (ref 26.0–34.0)
MCHC: 31.5 g/dL (ref 30.0–36.0)
MCV: 85.8 fL (ref 80.0–100.0)
Platelets: 134 10*3/uL — ABNORMAL LOW (ref 150–400)
RBC: 2.74 MIL/uL — ABNORMAL LOW (ref 3.87–5.11)
RDW: 14.8 % (ref 11.5–15.5)
WBC: 6.6 10*3/uL (ref 4.0–10.5)
nRBC: 0 % (ref 0.0–0.2)

## 2022-05-22 LAB — BASIC METABOLIC PANEL
Anion gap: 6 (ref 5–15)
BUN: 14 mg/dL (ref 8–23)
CO2: 24 mmol/L (ref 22–32)
Calcium: 7.9 mg/dL — ABNORMAL LOW (ref 8.9–10.3)
Chloride: 106 mmol/L (ref 98–111)
Creatinine, Ser: 0.51 mg/dL (ref 0.44–1.00)
GFR, Estimated: >60 mL/min (ref >60)
Glucose, Bld: 107 mg/dL — ABNORMAL HIGH (ref 70–99)
Potassium: 3.6 mmol/L (ref 3.5–5.1)
Sodium: 136 mmol/L (ref 135–145)

## 2022-05-22 MED ORDER — DILTIAZEM HCL ER COATED BEADS 180 MG PO CP24
180.0000 mg | ORAL_CAPSULE | Freq: Once | ORAL | Status: AC
  Administered 2022-05-22: 180 mg via ORAL
  Filled 2022-05-22: qty 1

## 2022-05-22 MED ORDER — DILTIAZEM HCL ER COATED BEADS 180 MG PO CP24
180.0000 mg | ORAL_CAPSULE | Freq: Two times a day (BID) | ORAL | Status: DC
  Administered 2022-05-22 – 2022-05-24 (×4): 180 mg via ORAL
  Filled 2022-05-22 (×4): qty 1

## 2022-05-22 NOTE — Progress Notes (Signed)
PROGRESS NOTE Yvonne Ballard  [id number] DOB: Jan 03, 1941 DOA: 05/17/2022 PCP: [name], MD  Brief Narrative/Hospital Course: 82 year old female history of stroke in January 2024, hypertension, hyperlipidemia, CAD, PMR admitted after mechanical fall.. Pt lives in ALF([facility]). Pt states she was walking with her walker to her bedroom to take a nap. Lost her balance and fell onto her left side.  In the ED uncontrolled hypertension 210/80 afebrile heart rate variable 55-102, on room air.  Labs with mild hypokalemia normocytic anemia 9.7.  Imaging chest x-ray unremarkable hip x-ray showed acute transverse fracture of the proximal left femoral neck with mild to moderate superolateral displacement of the distal fracture component and valgus angulation.  EKG showed NSR with PACs on admission review. Orthopedic surgery consulted and admitted.  Seen by cardiology for preop evaluation S/p left hip hemiarthroplasty by Dr Mardelle Matte 05/19/22   Subjective:  Seen and examined, Overnight vitals/labs/events reviewed  No complaints- feels little weak- we talked abt transfuions Patient was resting comfortably, no new complaints Later on started have heart rate in 120s to 150s EKG showing A-fib with RVR-Ortho symptomatic  Assessment and Plan: Principal Problem:   Closed displaced fracture of left femoral neck (HCC) Active Problems:   Benign essential HTN   CAD (coronary artery disease), native coronary artery   Dyslipidemia, goal LDL below 70   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[facility])   History of CVA (cerebrovascular accident)- 03/14/2022   DNR (do not resuscitate)/DNI(Do Not Intubate)   Hypokalemia   Closed displaced fracture of left femoral neck secondary to fall: s/peft hip hemiarthroplasty by Dr Mardelle Matte 05/19/22.  Surgically stable.  Continue current pain control with opiates/episodes, laxatives, PT OT.  DVT prophylaxis with aspirin Plavix x 4 weeks followed resumption of home Plavix alone for his  strokeShe was supposed on Plavix alone from recent stroke-patient does not prefer to be on subcu injection.   Recent CVA 03/14/2022: per discharge summary 1/5 patient was recommended to continue aspirin Plavix x 3 weeks>followed by Plavix alone.currently aspirin was added for DVT prophylaxis as per orthopedics see above she tells me she has not been very compliant with her antiblood  Ofnote patient had refused statin on last discahrge.  Tachyarrhythmia PACs: Likely due to pain.  Surgical status has been recurrent issues at times EKG and monitor read as A-fib but on multiple review A-fib not found seen by cardiology 3/9, on Cardizem CD 120 mg twice daily, received Cardizem 30 mg additional dose 3/9 for tachyarrhythmia after 120s-150s.  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 Nausea vomiting-resolved.  [id number]. Dyslipidemia, goal LDL below 70:refused statin on last admit.  [id number]. Benign essential HTN: BP well-controlled , UNcontrolled on admission likely from pain on avaprol/cardizem Hypokalemia: Resolved.  ABLA due to#  1 Normocytic anemia: Hemoglobin further downtrending 7.4 g in the setting of ABLA with #1. I advised 1 unit PRBC 1 unit PRBC-she declined and waits until if > 7 gm to get blood.  Recent Labs  Lab 05/19/22 0130 05/19/22 1914 05/20/22 0125 05/21/22 0125 05/22/22 0103  HGB 9.9* 9.4* 8.7* 8.1* 7.4*  HCT 31.4* 29.4* 27.4* 25.7* 23.5*    GOC :DNR Verified with patient on admission  DVT prophylaxis: SCDs Start: 05/19/22 1721 SCDs Start: 05/17/22 2139 Code Status:   Code Status: DNR Family Communication: plan of care discussed with  patient at bedside. Patient status is: Inpatient because of hip fracture Level of care: Telemetry Cardiac   Dispo: The  patient is from: ALF            Anticipated disposition: SNF pending   Objective: Vitals last 24 hrs: Vitals:   05/21/22 1941 05/21/22 2323 05/22/22 0422 05/22/22 0730  BP: (!) 145/87 (!) 166/75 135/61   Pulse: 62 76 69 100  Resp: '19 20 20 20  '$ Temp: 97.7 F (36.5 C) 98.9 F (37.2 C) 98.7 F (37.1 C) 98.1 F (36.7 C)  TempSrc: Oral Axillary Oral Oral  SpO2: 94% 95% 93% 92%  Weight:      Height:       Weight change:   Physical Examination: General exam: AA, weak,older appearing HEENT:Oral mucosa moist, Ear/Nose WNL grossly, dentition normal. Respiratory system: bilaterally clear BS, no use of accessory muscle Cardiovascular system: S1 & S2 +, regular rate, JVD neg. Gastrointestinal system: Abdomen soft, NT,ND,BS+ Nervous System:Alert, awake, moving extremities and grossly nonfocal Extremities: LE ankle edema neg, bruise around left hip with aquacel dressing + Skin: No rashes,no icterus. MSK: Normal muscle bulk,tone, power    Medications reviewed:  Scheduled Meds:  aspirin EC  81 mg Oral Daily   Chlorhexidine Gluconate Cloth  6 each Topical Q0600   clopidogrel  75 mg Oral Daily   diltiazem  120 mg Oral BID   docusate sodium  100 mg Oral BID   famotidine  20 mg Oral Daily   feeding supplement  237 mL Oral BID BM   ferrous sulfate  325 mg Oral TID PC   irbesartan  75 mg Oral BID   multivitamin with minerals  1 tablet Oral Daily   mupirocin ointment  1 Application Nasal BID   senna  1 tablet Oral BID   Continuous Infusions:    Diet Order             Diet regular Room service appropriate? Yes; Fluid consistency: Thin  Diet effective now                  Intake/Output Summary (Last 24 hours) at 05/22/2022 0751 Last data filed at 05/21/2022 1500 Gross per 24 hour  Intake 530 ml  Output --  Net 530 ml    Net IO Since Admission: -1,557.25 mL [05/22/22 0751]  Wt Readings from Last 3 Encounters:  05/19/22 44 kg  05/04/22 44.2 kg  03/15/22 43.4 kg    Unresulted Labs (From admission, onward)     Start     Ordered   05/26/22 0500  Creatinine, serum  (enoxaparin (LOVENOX)  CrCl >/= 30 mL/min  )  Weekly,   R     Comments: while on enoxaparin therapy.   Question:  Specimen collection method  Answer:  Lab=Lab collect   05/19/22 1720   05/19/22 0500  CBC  Daily,   R      05/18/22 1108   05/19/22 [id number]  Basic metabolic panel  Daily,   R      05/18/22 1108          Data Reviewed: I have personally reviewed following labs and imaging studies CBC: Recent Labs  Lab 05/19/22 0130 05/19/22 1914 05/20/22 0125 05/21/22 0125 05/22/22 0103  WBC 9.4 14.2* 10.5 7.6 6.6  HGB 9.9* 9.4* 8.7* 8.1* 7.4*  HCT 31.4* 29.4* 27.4* 25.7* 23.5*  MCV 85.6 86.2 86.4 85.4 85.8  PLT 227 194 181 143* 134*    Basic Metabolic Panel: Recent  Labs  Lab 05/17/22 1732 05/17/22 1740 05/19/22 0130 05/19/22 1914 05/20/22 0125 05/21/22 0125 05/22/22 0103  NA 138 138 135  --  132* 138 136  K 3.4* 3.4* 3.8  --  4.2 3.3* 3.6  CL 102 100 99  --  99 104 106  CO2 24  --  25  --  '26 27 24  '$ GLUCOSE 125* 124* 133*  --  147* 104* 107*  BUN '12 12 19  '$ --  25* 19 14  CREATININE 0.62 0.60 0.76 0.70 0.76 0.60 0.51  CALCIUM 8.8*  --  9.1  --  8.2* 8.1* 7.9*    Recent Results (from the past 240 hour(s))  Surgical pcr screen     Status: Abnormal   Collection Time: 05/18/22  6:05 PM   Specimen: Nasal Mucosa; Nasal Swab  Result Value Ref Range Status   MRSA, PCR POSITIVE (A) NEGATIVE Final    Comment: RESULT CALLED TO, READ BACK BY AND VERIFIED WITH: RN NDEYA ON 05/18/22 @ 2057 BY DRT    Staphylococcus aureus POSITIVE (A) NEGATIVE Final    Comment: (NOTE) The Xpert SA Assay (FDA approved for NASAL specimens in patients 11 years of age and older), is one component of a comprehensive surveillance program. It is not intended to diagnose infection nor to guide or monitor treatment. Performed at [facility], [place] [address] [place], [place] [address]      Antimicrobials: Anti-infectives (From admission, onward)    Start     Dose/Rate Route Frequency Ordered Stop   05/19/22 2100  ceFAZolin (ANCEF) IVPB 2g/100 mL premix        2 g 200 mL/hr over 30 Minutes Intravenous Every 6 hours 05/19/22 1720 05/20/22 0530   05/19/22 1315  ceFAZolin (ANCEF) IVPB 2g/100 mL premix        2 g 200 mL/hr over 30 Minutes Intravenous On call to O.R. 05/19/22 1255 05/19/22 1419      Culture/Microbiology    Component Value Date/Time   SDES URINE, CLEAN CATCH 04/19/2014 0610   SPECREQUEST NONE 04/19/2014 0610   CULT  04/19/2014 0610    Multiple bacterial morphotypes present, none predominant. Suggest appropriate recollection if clinically indicated. Performed at [facility] 04/21/2014 FINAL 04/19/2014 [id number]    Radiology Studies: No results found.   LOS: 5 days   Antonieta Pert, MD [facility]  05/22/2022, 7:51 AM

## 2022-05-22 NOTE — Progress Notes (Signed)
Subjective: 3 Days Post-Op s/p Procedure(s): LEFT PARTIAL HIP ARTHROPLASTY   Patient is alert, oriented. States no pain at rest, no pain overnight. She worked very well with therapy yesterday. She is doing very well and happy with her progress. She is nervous about going to SNF. She has been to [facility] before and unfortunately did not have a great experience. She is interested in [facility] as an option.   Denies chest pain, SOB, Calf pain. No nausea/vomiting. No other complaints.    Objective:  PE: VITALS:   Vitals:   05/22/22 0422 05/22/22 0730 05/22/22 0800 05/22/22 0841  BP: 135/61  (!) 135/91 (!) 135/91  Pulse: 69 100 81   Resp: '20 20 20   '$ Temp: 98.7 F (37.1 C) 98.1 F (36.7 C)    TempSrc: Oral Oral    SpO2: 93% 92% 93%   Weight:      Height:        ABD soft Sensation intact distally Intact pulses distally Dorsiflexion/Plantar flexion intact Incision: scant drainage dressing with only a small amount of strike through noted.   LABS  Results for orders placed or performed during the hospital encounter of 05/17/22 (from the past 24 hour(s))  CBC     Status: Abnormal   Collection Time: 05/22/22  1:03 AM  Result Value Ref Range   WBC 6.6 4.0 - 10.5 K/uL   RBC 2.74 (L) 3.87 - 5.11 MIL/uL   Hemoglobin 7.4 (L) 12.0 - 15.0 g/dL   HCT 23.5 (L) 36.0 - 46.0 %   MCV 85.8 80.0 - 100.0 fL   MCH 27.0 26.0 - 34.0 pg   MCHC 31.5 30.0 - 36.0 g/dL   RDW 14.8 11.5 - 15.5 %   Platelets 134 (L) 150 - 400 K/uL   nRBC 0.0 0.0 - 0.2 %  Basic metabolic panel     Status: Abnormal   Collection Time: 05/22/22  1:03 AM  Result Value Ref Range   Sodium 136 135 - 145 mmol/L   Potassium 3.6 3.5 - 5.1 mmol/L   Chloride 106 98 - 111 mmol/L   CO2 24 22 - 32 mmol/L   Glucose, Bld 107 (H) 70 - 99 mg/dL   BUN 14 8 - 23 mg/dL   Creatinine, Ser 0.51 0.44 - 1.00 mg/dL   Calcium 7.9 (L) 8.9 - 10.3 mg/dL   GFR, Estimated >60 >60 mL/min   Anion gap 6 5 - 15    No results  found.  Assessment/Plan: Principal Problem:   Closed displaced fracture of left femoral neck (HCC) Active Problems:   Benign essential HTN   CAD (coronary artery disease), native coronary artery   Dyslipidemia, goal LDL below 70   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HCC)   History of CVA (cerebrovascular accident)- 03/14/2022   DNR (do not resuscitate)/DNI(Do Not Intubate)   Hypokalemia   3 Days Post-Op s/p Procedure(s): LEFT PARTIAL HIP ARTHROPLASTY  Weightbearing: WBAT LLE Insicional and dressing care: Reinforce dressings as needed New dressing placed on 3/9. VTE prophylaxis: Will plan to restart her baseline ASA and plavix. Per original medicine H&P, at baseline she is on ASA 81 mg daily and plavix 75 mg daily with plan to stop her aspirin. Pain control: continue current regimen Follow - up plan: 2 weeks with Dr. Mardelle Matte Dispo: PT recommending SNF. Patient is nervous about SNF due to previous experience. She is interested in [facility] [place] [facility] as an option. Will reach out to [facility] team.   Contact information:  After hours and holidays please check [email or web address] for group call information for [facility] 05/22/2022, 9:55 AM

## 2022-05-22 NOTE — Progress Notes (Signed)
Rounding Note    Patient Name: Yvonne Ballard Date of Encounter: 05/22/2022  [place] Cardiologist: Peter Martinique, MD   Subjective   Patient asymptomatic of SVT.  Notes shat she feels fine.  She reiterate her concerns about further stool softeners.  Inpatient Medications    Scheduled Meds:  aspirin EC  81 mg Oral Daily   Chlorhexidine Gluconate Cloth  6 each Topical Q0600   clopidogrel  75 mg Oral Daily   diltiazem  120 mg Oral BID   docusate sodium  100 mg Oral BID   famotidine  20 mg Oral Daily   feeding supplement  237 mL Oral BID BM   ferrous sulfate  325 mg Oral TID PC   irbesartan  75 mg Oral BID   multivitamin with minerals  1 tablet Oral Daily   mupirocin ointment  1 Application Nasal BID   senna  1 tablet Oral BID   Continuous Infusions:   PRN Meds: acetaminophen, alum & mag hydroxide-simeth, bisacodyl, hydrALAZINE, HYDROcodone-acetaminophen, magnesium citrate, menthol-cetylpyridinium **OR** phenol, morphine injection, ondansetron **OR** ondansetron (ZOFRAN) IV, polyethylene glycol, traMADol   Vital Signs    Vitals:   05/21/22 1941 05/21/22 2323 05/22/22 0422 05/22/22 0730  BP: (!) 145/87 (!) 166/75 135/61   Pulse: 62 76 69 100  Resp: '19 20 20 20  '$ Temp: 97.7 F (36.5 C) 98.9 F (37.2 C) 98.7 F (37.1 C) 98.1 F (36.7 C)  TempSrc: Oral Axillary Oral Oral  SpO2: 94% 95% 93% 92%  Weight:      Height:        Intake/Output Summary (Last 24 hours) at 05/22/2022 0755 Last data filed at 05/21/2022 1500 Gross per 24 hour  Intake 530 ml  Output --  Net 530 ml      05/19/2022   12:37 PM 05/17/2022    5:22 PM 05/04/2022    3:33 PM  Last 3 Weights  Weight (lbs) 97 lb 97 lb 6.4 oz 97 lb 6.4 oz  Weight (kg) 43.999 kg 44.18 kg 44.18 kg      Telemetry    Sinus arrhythmia with frequent PACs. RBBB pattern - Personally Reviewed  Physical Exam   GEN: No acute distress.  Neck: No JVD Cardiac: rapid and irregularly irregular rhythm Respiratory:  Clear to auscultation bilaterally GI: Soft, nontender, non-distended  Psych: Calm  Labs    High Sensitivity Troponin:  No results for input(s): "TROPONINIHS" in the last 720 hours.   Chemistry Recent Labs  Lab 05/20/22 0125 05/21/22 0125 05/22/22 0103  NA 132* 138 136  K 4.2 3.3* 3.6  CL 99 104 106  CO2 '26 27 24  '$ GLUCOSE 147* 104* 107*  BUN 25* 19 14  CREATININE 0.76 0.60 0.51  CALCIUM 8.2* 8.1* 7.9*  GFRNONAA >60 >60 >60  ANIONGAP '7 7 6    '$ Lipids No results for input(s): "CHOL", "TRIG", "HDL", "LABVLDL", "LDLCALC", "CHOLHDL" in the last 168 hours.  Hematology Recent Labs  Lab 05/20/22 0125 05/21/22 0125 05/22/22 0103  WBC 10.5 7.6 6.6  RBC 3.17* 3.01* 2.74*  HGB 8.7* 8.1* 7.4*  HCT 27.4* 25.7* 23.5*  MCV 86.4 85.4 85.8  MCH 27.4 26.9 27.0  MCHC 31.8 31.5 31.5  RDW 14.3 14.5 14.8  PLT 181 143* 134*   Thyroid No results for input(s): "TSH", "FREET4" in the last 168 hours.  BNPNo results for input(s): "BNP", "PROBNP" in the last 168 hours.  DDimer No results for input(s): "DDIMER" in the last 168 hours.  Radiology    No results found.  Cardiac Studies   Cardiac Studies & Procedures       ECHOCARDIOGRAM  ECHOCARDIOGRAM COMPLETE 03/15/2022  Narrative ECHOCARDIOGRAM REPORT    Patient Name:   Yvonne Ballard Date of Exam: 03/15/2022 Medical Rec #:  [id number]      Height:       60.0 in Accession #:    [id number]     Weight:       95.2 lb Date of Birth:  May 06, 1940     BSA:          1.362 m Patient Age:    82 years       BP:           153/84 mmHg Patient Gender: F              HR:           71 bpm. Exam Location:  Inpatient  Procedure: 2D Echo, Color Doppler and Cardiac Doppler  Indications:    Stroke i63.9  History:        Patient has prior history of Echocardiogram examinations, most recent 11/16/2019. CAD; Risk Factors:Hypertension and Dyslipidemia. 03/31/14 [facility] Procedure with 15m Magna Ease Bioprosthetic Aortic Valve. Aortic Valve: valve is  present in the aortic position.  Sonographer:    ERaquel SarnaSenior RDCS Referring Phys: PLequita Halt  Sonographer Comments: Technically difficult due to thin body habitus. IMPRESSIONS   1. Left ventricular ejection fraction, by estimation, is 55 to 60%. The left ventricle has normal function. The left ventricle demonstrates regional wall motion abnormalities (see scoring diagram/findings for description). There is mild concentric left ventricular hypertrophy. Left ventricular diastolic parameters are consistent with Grade II diastolic dysfunction (pseudonormalization). Elevated left atrial pressure. 2. Right ventricular systolic function is mildly reduced. The right ventricular size is normal. There is normal pulmonary artery systolic pressure. The estimated right ventricular systolic pressure is 2Q000111QmmHg. 3. Left atrial size was mildly dilated. 4. 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 6. The aortic valve has been repaired/replaced. Aortic valve regurgitation is not visualized. No aortic stenosis is present. There is a valve present in the aortic position. Echo findings are consistent with normal structure and function of the aortic valve prosthesis. Aortic valve mean gradient measures 11.0 mmHg. Aortic valve Vmax measures 2.31 m/s. Aortic valve acceleration time measures 49 msec. 7. Aortic root/ascending aorta has been repaired/replaced and dilatation noted. 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  Comparison(s): No significant change from prior study. Prior images reviewed side by side.  FINDINGS Left Ventricle: Left ventricular ejection fraction, by estimation, is 55 to 60%. The left ventricle has normal function. The left ventricle demonstrates regional wall motion abnormalities. The left ventricular internal cavity size was normal in size. There  is mild concentric left ventricular hypertrophy. Abnormal (paradoxical) septal motion consistent with post-operative status. Left ventricular diastolic parameters are consistent with Grade II diastolic dysfunction (pseudonormalization). Elevated left atrial pressure.  Right Ventricle: The right ventricular size is normal. No increase in right ventricular wall thickness. Right ventricular systolic function is mildly reduced. There is normal pulmonary artery systolic pressure. The tricuspid regurgitant velocity is 2.33 m/s, and with an assumed right atrial pressure of 3 mmHg, the estimated right ventricular systolic pressure is 2Q000111QmmHg.  Left Atrium: Left atrial size was mildly dilated.  Right Atrium: Right atrial size was  normal in size. Prominent Chiari network.  Pericardium: There is no evidence of pericardial effusion.  Mitral Valve: The mitral valve is myxomatous. Mild mitral valve regurgitation.  Tricuspid Valve: 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  Aortic Valve: The aortic valve has been repaired/replaced. Aortic valve regurgitation is not visualized. No aortic stenosis is present. Aortic valve mean gradient measures 11.0 mmHg. Aortic valve peak gradient measures 21.3 mmHg. Aortic valve area, by VTI measures 1.40 cm. There is a valve present in the aortic position. Echo findings are consistent with normal structure and function of the aortic valve prosthesis.  Pulmonic Valve: The pulmonic valve was grossly normal. Pulmonic valve regurgitation is not visualized.  Aorta: The aortic root and ascending aorta are structurally normal, with no evidence of dilitation, the aortic root/ascending aorta has been repaired/replaced and aortic dilatation noted. 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  IAS/Shunts: No atrial level shunt detected by color flow  Doppler.   LEFT VENTRICLE PLAX 2D LVIDd:         3.90 cm   Diastology LVIDs:         2.20 cm   LV e' medial:    5.55 cm/s LV PW:         1.10 cm   LV E/e' medial:  16.6 LV IVS:        1.25 cm   LV e' lateral:   6.09 cm/s LVOT diam:     2.00 cm   LV E/e' lateral: 15.1 LV SV:         54 LV SV Index:   40 LVOT Area:     3.14 cm   RIGHT VENTRICLE RV S prime:     8.81 cm/s TAPSE (M-mode): 1.5 cm  LEFT ATRIUM             Index        RIGHT ATRIUM           Index LA diam:        3.20 cm 2.35 cm/m   RA Area:     15.70 cm LA Vol (A2C):   42.7 ml 31.36 ml/m  RA Volume:   33.20 ml  24.38 ml/m LA Vol (A4C):   43.0 ml 31.58 ml/m LA Biplane Vol: 44.2 ml 32.46 ml/m AORTIC VALVE AV Area (Vmax):    1.23 cm AV Area (Vmean):   1.30 cm AV Area (VTI):     1.40 cm AV Vmax:           231.00 cm/s AV Vmean:          160.000 cm/s AV VTI:            0.387 m AV Peak Grad:      21.3 mmHg AV Mean Grad:      11.0 mmHg LVOT Vmax:         90.80 cm/s LVOT Vmean:        66.100 cm/s LVOT VTI:          0.172 m LVOT/AV VTI ratio: 0.44  AORTA Ao Root diam: 3.40 cm Ao Asc diam:  2.70 cm  MITRAL VALVE               TRICUSPID VALVE MV Area (PHT): 3.45 cm    TR Peak grad:   21.7 mmHg MV Decel Time: 220 msec    TR Vmax:  233.00 cm/s MV E velocity: 92.00 cm/s MV A velocity: 68.10 cm/s  SHUNTS MV E/A ratio:  1.35        Systemic VTI:  0.17 m Systemic Diam: 2.00 cm  Mihai Croitoru MD Electronically signed by Sanda Klein MD Signature Date/Time: 03/15/2022/10:48:38 AM    Final              Patient Profile     Yvonne Ballard is a 82 y.o. female with a hx of diastolic dysfunction without heart failure, aortic stenosis status post AVR, ascending aortic aneurysm (6.5cm), mild nonobstructive CAD, CVA, HTN, HLD, PMR, who is being seen as a re-round on 3/9 at the request of Dr. Lupita Leash for tachy-arrhythmia.  Assessment & Plan    Paroxsymal atrial tachycardia with frequent PACs PACs -  increase to diltiazem 180 BID  Hypertension - Patient's BP generally at goal/baseline. - if hypotension stop irbestartan  Nonobstructive CAD Hyperlipidemia - Patient with hx non-obstructive CAD found with SAVR workup. No anginal symptoms this admission. Patient not on medical therapy, expresses desire for lifestyle management.  Hx cardiomyopathy, EF now normalized post SAVR - LVEF 55-60% as of 03/15/22 TTE. No evidence of volume overload on exam.   CVA - Anti-platelet regimen per primary team/neurology      For questions or updates, please contact [facility] Please consult [email or web address] for contact info under      Rudean Haskell, MD [place], [address] [place],  [phone]  7:55 AM

## 2022-05-22 NOTE — Progress Notes (Signed)
Physical Therapy Treatment Patient Details Name: Yvonne Ballard MRN: [id number] DOB: 1940/05/13 Today's Date: 05/22/2022   History of Present Illness Patient is a 82 y/o female who presents with left femoral neck fx s/p fall at home now s/p left hip hemiarthroplasty 3/7. 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    PT Comments    Pt making steady progress but still not at a level where she could return home alone. Continue to recommend SNF for rehab and pt agreeable. Continue to work toward maximizing independence and safety.   Recommendations for follow up therapy are one component of a multi-disciplinary discharge planning process, led by the attending physician.  Recommendations may be updated based on patient status, additional functional criteria and insurance authorization.  Follow Up Recommendations  Skilled nursing-short term rehab (<3 hours/day) Can patient physically be transported by private vehicle: Yes   Assistance Recommended at Discharge Intermittent Supervision/Assistance  Patient can return home with the following A little help with walking and/or transfers;A little help with bathing/dressing/bathroom   Equipment Recommendations  None recommended by PT    Recommendations for Other Services       Precautions / Restrictions Precautions Precautions: Fall Restrictions Weight Bearing Restrictions: Yes LLE Weight Bearing: Weight bearing as tolerated Other Position/Activity Restrictions: No hip precautions mentioned/ordered     Mobility  Bed Mobility               General bed mobility comments: Pt up in chair    Transfers Overall transfer level: Needs assistance Equipment used: Rolling walker (2 wheels) Transfers: Sit to/from Stand Sit to Stand: Min guard           General transfer comment: Verbal cues for hand placement.    Ambulation/Gait Ambulation/Gait assistance: Min guard Gait Distance (Feet): 150  Feet Assistive device: Rolling walker (2 wheels) Gait Pattern/deviations: Step-to pattern, Decreased stance time - left, Decreased step length - right, Trunk flexed Gait velocity: decreased Gait velocity interpretation: <1.8 ft/sec, indicate of risk for recurrent falls   General Gait Details: focused on getting lt foot flat with stance   Stairs             Wheelchair Mobility    Modified Rankin (Stroke Patients Only)       Balance Overall balance assessment: History of Falls, Needs assistance Sitting-balance support: No upper extremity supported, Feet supported Sitting balance-Leahy Scale: Good     Standing balance support: Reliant on assistive device for balance, Bilateral upper extremity supported Standing balance-Leahy Scale: Poor Standing balance comment: walker and min guard for static standing                            Cognition Arousal/Alertness: Awake/alert Behavior During Therapy: WFL for tasks assessed/performed Overall Cognitive Status: Within Functional Limits for tasks assessed                                          Exercises Total Joint Exercises Ankle Circles/Pumps: AROM, Both, 10 reps, Seated Long Arc Quad: AROM, Left, 10 reps, Seated Other Exercises Other Exercises: Repeated sit to stand x 5 Other Exercises: Seated active hip flex x 10 on lt    General Comments        Pertinent Vitals/Pain Pain Assessment Pain Assessment: Faces Faces Pain Scale: Hurts a little bit Pain  Location: left hip Pain Descriptors / Indicators: Tender Pain Intervention(s): Limited activity within patient's tolerance    Home Living                          Prior Function            PT Goals (current goals can now be found in the care plan section) Progress towards PT goals: Progressing toward goals    Frequency    Min 4X/week      PT Plan Current plan remains appropriate    Co-evaluation               AM-PAC PT "6 Clicks" Mobility   Outcome Measure  Help needed turning from your back to your side while in a flat bed without using bedrails?: A Little Help needed moving from lying on your back to sitting on the side of a flat bed without using bedrails?: A Little Help needed moving to and from a bed to a chair (including a wheelchair)?: A Little Help needed standing up from a chair using your arms (e.g., wheelchair or bedside chair)?: A Little Help needed to walk in hospital room?: A Little Help needed climbing 3-5 steps with a railing? : A Lot 6 Click Score: 17    End of Session Equipment Utilized During Treatment: Gait belt Activity Tolerance: Patient tolerated treatment well Patient left: in chair;with call bell/phone within reach;with chair alarm set Nurse Communication: Mobility status PT Visit Diagnosis: Difficulty in walking, not elsewhere classified (R26.2)     Time: [id number] PT Time Calculation (min) (ACUTE ONLY): 20 min  Charges:  $Gait Training: 8-22 mins                     [place] 05/22/2022, 4:26 PM

## 2022-05-23 DIAGNOSIS — S72002A Fracture of unspecified part of neck of left femur, initial encounter for closed fracture: Secondary | ICD-10-CM | POA: Diagnosis not present

## 2022-05-23 LAB — BASIC METABOLIC PANEL
Anion gap: 10 (ref 5–15)
BUN: 18 mg/dL (ref 8–23)
CO2: 21 mmol/L — ABNORMAL LOW (ref 22–32)
Calcium: 8 mg/dL — ABNORMAL LOW (ref 8.9–10.3)
Chloride: 106 mmol/L (ref 98–111)
Creatinine, Ser: 0.57 mg/dL (ref 0.44–1.00)
GFR, Estimated: >60 mL/min (ref >60)
Glucose, Bld: 112 mg/dL — ABNORMAL HIGH (ref 70–99)
Potassium: 3.8 mmol/L (ref 3.5–5.1)
Sodium: 137 mmol/L (ref 135–145)

## 2022-05-23 LAB — CBC
HCT: 21.6 % — ABNORMAL LOW (ref 36.0–46.0)
Hemoglobin: 7 g/dL — ABNORMAL LOW (ref 12.0–15.0)
MCH: 27.8 pg (ref 26.0–34.0)
MCHC: 32.4 g/dL (ref 30.0–36.0)
MCV: 85.7 fL (ref 80.0–100.0)
Platelets: 136 10*3/uL — ABNORMAL LOW (ref 150–400)
RBC: 2.52 MIL/uL — ABNORMAL LOW (ref 3.87–5.11)
RDW: 14.9 % (ref 11.5–15.5)
WBC: 7.3 10*3/uL (ref 4.0–10.5)
nRBC: 0 % (ref 0.0–0.2)

## 2022-05-23 LAB — PREPARE RBC (CROSSMATCH)

## 2022-05-23 LAB — HEMOGLOBIN AND HEMATOCRIT, BLOOD
HCT: 27.2 % — ABNORMAL LOW (ref 36.0–46.0)
Hemoglobin: 8.7 g/dL — ABNORMAL LOW (ref 12.0–15.0)

## 2022-05-23 MED ORDER — ACETAMINOPHEN 325 MG PO TABS
650.0000 mg | ORAL_TABLET | Freq: Once | ORAL | Status: AC
  Administered 2022-05-23: 650 mg via ORAL
  Filled 2022-05-23: qty 2

## 2022-05-23 MED ORDER — SODIUM CHLORIDE 0.9% IV SOLUTION: Freq: Once | INTRAVENOUS | Status: AC

## 2022-05-23 MED ORDER — FUROSEMIDE 20 MG PO TABS
20.0000 mg | ORAL_TABLET | Freq: Once | ORAL | Status: DC
  Filled 2022-05-23: qty 1

## 2022-05-23 NOTE — Progress Notes (Signed)
Follow up with cardiology arranged on 06/03/22, see AVS

## 2022-05-23 NOTE — Progress Notes (Signed)
Occupational Therapy Treatment Patient Details Name: Yvonne Ballard MRN: [id number] DOB: [birth year] Today's Date: 05/23/2022   History of present illness Patient is a 82 y/o female who presents with left femoral neck fx s/p fall at home now s/p left hip hemiarthroplasty 3/7. 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   OT comments  Pt continuing to progress towards Pt focused goals. Pt completed hall level ambulation with close supervision + RW,  donned/doffed L sock with no challenges. Pt completed sit>stands from chair with close supervision + RW, Pt stated she has 4/10 pain in L hip but it does not exceed that. Pain monitored throughout OT session. Pt noted mild discomfort of L hip when transferring to sitting position, performed compensatory technique with LLE positioned in extension to decrease hip discomfort. Pt stated that compensatory technique resolved discomfort. Pt continue to benefit from skilled acute OT services to address above deficits and transition to next level of care. OT continuing to recommend SNF.    Recommendations for follow up therapy are one component of a multi-disciplinary discharge planning process, led by the attending physician.  Recommendations may be updated based on patient status, additional functional criteria and insurance authorization.    Follow Up Recommendations  Skilled nursing-short term rehab (<3 hours/day)     Assistance Recommended at Discharge Frequent or constant Supervision/Assistance  Patient can return home with the following  Help with stairs or ramp for entrance;A little help with bathing/dressing/bathroom;A little help with walking and/or transfers;Assist for transportation;Assistance with cooking/housework   Equipment Recommendations  BSC/3in1    Recommendations for Other Services      Precautions / Restrictions Precautions Precautions: Fall Restrictions Weight Bearing Restrictions: Yes LLE  Weight Bearing: Weight bearing as tolerated Other Position/Activity Restrictions: No hip precautions mentioned/ordered       Mobility Bed Mobility               General bed mobility comments: Pt received in chair prior to beginning today's session    Transfers Overall transfer level: Needs assistance Equipment used: Rolling walker (2 wheels) Transfers: Sit to/from Stand Sit to Stand: Supervision           General transfer comment: chair t/f and sit>stand x2 close Supervision + RW.     Balance Overall balance assessment: History of Falls, Needs assistance Sitting-balance support: Feet supported, No upper extremity supported Sitting balance-Leahy Scale: Good     Standing balance support: During functional activity, Bilateral upper extremity supported Standing balance-Leahy Scale: Poor Standing balance comment: UE supported with RW                           ADL either performed or assessed with clinical judgement   ADL Overall ADL's : Needs assistance/impaired                     Lower Body Dressing: Sitting/lateral leans;Supervision/safety Lower Body Dressing Details (indicate cue type and reason): doffed/donned L sock sitting, Supervision             Functional mobility during ADLs: Supervision/safety;Rolling walker (2 wheels) General ADL Comments: Pt completed hall level ambulation with close supervision + RW    Extremity/Trunk Assessment Upper Extremity Assessment Upper Extremity Assessment: Overall WFL for tasks assessed   Lower Extremity Assessment Lower Extremity Assessment: Defer to PT evaluation LLE Deficits / Details: post op deficits- able to perform LAQ but limited in hip flexion AROM  Cervical / Trunk Assessment Cervical / Trunk Assessment: Kyphotic    Vision   Vision Assessment?: No apparent visual deficits   Perception Perception Perception: Not tested   Praxis Praxis Praxis: Not tested    Cognition  Arousal/Alertness: Awake/alert Behavior During Therapy: WFL for tasks assessed/performed Overall Cognitive Status: Within Functional Limits for tasks assessed                         Following Commands: Follows one step commands consistently       General Comments: Pt expresses a decreased need for assistance        Exercises      Shoulder Instructions       General Comments  VSS on RA. HR to 104 with mobility    Pertinent Vitals/ Pain       Pain Assessment Pain Assessment: 0-10 Pain Score: 4  Pain Location: left hip Pain Descriptors / Indicators: Aching Pain Intervention(s): Limited activity within patient's tolerance                                                          Frequency  Min 2X/week        Progress Toward Goals  OT Goals(current goals can now be found in the care plan section)  Progress towards OT goals: Progressing toward goals  Acute Rehab OT Goals Patient Stated Goal: Return home OT Goal Formulation: With patient Time For Goal Achievement: 06/03/22 Potential to Achieve Goals: Good  Plan Discharge plan remains appropriate;Frequency remains appropriate    Co-evaluation                 AM-PAC OT "6 Clicks" Daily Activity     Outcome Measure   Help from another person eating meals?: None Help from another person taking care of personal grooming?: None Help from another person toileting, which includes using toliet, bedpan, or urinal?: A Little Help from another person bathing (including washing, rinsing, drying)?: A Little Help from another person to put on and taking off regular upper body clothing?: None Help from another person to put on and taking off regular lower body clothing?: A Little 6 Click Score: 21    End of Session Equipment Utilized During Treatment: Rolling walker (2 wheels)  OT Visit Diagnosis: Unsteadiness on feet (R26.81);History of falling (Z91.81);Pain Pain - Right/Left:  Left Pain - part of body: Leg;Hip   Activity Tolerance Patient tolerated treatment well   Patient Left in chair;with call bell/phone within reach;with family/visitor present;with chair alarm set   Nurse Communication Mobility status        Time: 1435-1455 OT Time Calculation (min): 20 min  Charges: OT General Charges $OT Visit: 1 Visit OT Treatments $Therapeutic Activity: 8-22 mins  05/23/2022  AB, OTR/L  Acute Rehabilitation Services  Office: [phone]   Cori Razor 05/23/2022, 3:14 PM

## 2022-05-23 NOTE — Progress Notes (Signed)
Physical Therapy Treatment Patient Details Name: Yvonne Ballard MRN: [id number] DOB: 1940-06-11 Today's Date: 05/23/2022   History of Present Illness Patient is a 82 y/o female who presents with left femoral neck fx s/p fall at home now s/p left hip hemiarthroplasty 3/7. 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    PT Comments    Patient progressing slowly towards PT goals. Session focused on there ex of BLEs and functional mobility. Pt with low hemoglobin and awaiting blood transfusion. Reports feeling fatigued and low energy but agreeable to get to chair and for exercise. Reports no pain and tolerated there ex sitting in chair without difficulty. Eager to speak with SW about rehab. Frequency updated to 3x/week as pt not agreeable to SNF. Encouraged walking with mobility tech this afternoon after blood transfusion. Will follow.    Recommendations for follow up therapy are one component of a multi-disciplinary discharge planning process, led by the attending physician.  Recommendations may be updated based on patient status, additional functional criteria and insurance authorization.  Follow Up Recommendations  Skilled nursing-short term rehab (<3 hours/day) Can patient physically be transported by private vehicle: Yes   Assistance Recommended at Discharge Intermittent Supervision/Assistance  Patient can return home with the following A little help with walking and/or transfers;A little help with bathing/dressing/bathroom   Equipment Recommendations  None recommended by PT    Recommendations for Other Services       Precautions / Restrictions Precautions Precautions: Fall Restrictions Weight Bearing Restrictions: Yes LLE Weight Bearing: Weight bearing as tolerated Other Position/Activity Restrictions: No hip precautions mentioned/ordered     Mobility  Bed Mobility Overal bed mobility: Needs Assistance Bed Mobility: Supine to Sit      Supine to sit: Supervision, HOB elevated     General bed mobility comments: Increased time with use of rail, no assist needed.    Transfers Overall transfer level: Needs assistance Equipment used: Rolling walker (2 wheels) Transfers: Sit to/from Stand, Bed to chair/wheelchair/BSC Sit to Stand: Min guard   Step pivot transfers: Min guard       General transfer comment: Min guard for safety. Stood from EOB x1, slow to rise. ABle to take a few steps to get to chair, declines further ambulation until after blood transfusion due to fatigue.    Ambulation/Gait                   Stairs             Wheelchair Mobility    Modified Rankin (Stroke Patients Only)       Balance Overall balance assessment: History of Falls, Needs assistance Sitting-balance support: Feet supported, No upper extremity supported Sitting balance-Leahy Scale: Good     Standing balance support: During functional activity, Bilateral upper extremity supported Standing balance-Leahy Scale: Poor Standing balance comment: Requires UE support in standing                            Cognition Arousal/Alertness: Awake/alert Behavior During Therapy: Impulsive Overall Cognitive Status: Within Functional Limits for tasks assessed                                 General Comments: Not good at sitting still.        Exercises General Exercises - Lower Extremity Quad Sets: AROM, Left, 10 reps, Seated Long Arc Quad:  AROM, Left, 10 reps, Seated Hip ABduction/ADduction: AROM, AAROM, Left, 10 reps, Seated (x2) Hip Flexion/Marching: AROM, Left, 20 reps, Seated Toe Raises: AROM, Both, 10 reps, Seated Heel Raises: AROM, Both, 10 reps, Seated    General Comments General comments (skin integrity, edema, etc.): HR in A-fib during session.      Pertinent Vitals/Pain Pain Assessment Pain Assessment: No/denies pain    Home Living                          Prior  Function            PT Goals (current goals can now be found in the care plan section) Progress towards PT goals: Not progressing toward goals - comment (due to fatigue/low blood)    Frequency    Min 3X/week      PT Plan Current plan remains appropriate;Frequency needs to be updated    Co-evaluation              AM-PAC PT "6 Clicks" Mobility   Outcome Measure  Help needed turning from your back to your side while in a flat bed without using bedrails?: A Little Help needed moving from lying on your back to sitting on the side of a flat bed without using bedrails?: A Little Help needed moving to and from a bed to a chair (including a wheelchair)?: A Little Help needed standing up from a chair using your arms (e.g., wheelchair or bedside chair)?: A Little Help needed to walk in hospital room?: A Little Help needed climbing 3-5 steps with a railing? : A Lot 6 Click Score: 17    End of Session Equipment Utilized During Treatment: Gait belt Activity Tolerance: Patient tolerated treatment well Patient left: in chair;with call bell/phone within reach;with chair alarm set Nurse Communication: Mobility status PT Visit Diagnosis: Difficulty in walking, not elsewhere classified (R26.2)     Time: [id number] PT Time Calculation (min) (ACUTE ONLY): 17 min  Charges:  $Therapeutic Exercise: 8-22 mins                     Marisa Severin, PT, DPT Acute Rehabilitation Services Secure chat preferred Office [phone]      Marguarite Arbour A [place] 05/23/2022, 11:10 AM

## 2022-05-23 NOTE — TOC Progression Note (Addendum)
Transition of Care [facility]) - Progression Note    Patient Details  Name: Anvika Norrick MRN: [id number] Date of Birth: 1940-08-03  Transition of Care [facility]) CM/SW [place], [place] Phone Number: 05/23/2022, 3:35 PM  Clinical Narrative:     CSW spoke with patient's daughter Nunzio Cory,- explained per [facility]- patient was there 03/18/22-04/02/22, she has 5 days left that would be covered at 100%. Per [employer] they are checking to determine if [facility] will cover copays. She request a number to appeal Medicare rule of 20 days at 100% - 21 day forward patient will have a copayment, since the patient has not had 60 days wellness requirement. CSW informed, there is no appeal # it is medicare  policy/rule & she can google or call [place] or [place] to confirm if she remains uncertain of what she has been advised. She asked if CSW was responsible for making determination, again CSW explained, the policy/rule is set by Medicare  She expressed CSW was not "compassionate" or "helpful". CSW explained she was requesting information that could not be provided but CSW was willing and trying to assist her. CSW updated leadership.  TOC will continue to follow and assist with discharge planning.  Thurmond Butts, MSW, LCSW Clinical Social Worker      Expected Discharge Plan: Skilled Nursing Facility Barriers to Discharge: Continued Medical Work up  Expected Discharge Plan and Services In-house Referral: Clinical Social Work Discharge Planning Services: CM Consult Post Acute Care Choice: Home Health, [place] Living arrangements for the past 2 months: Apartment                                       Social Determinants of Health (SDOH) Interventions SDOH Screenings   Food Insecurity: No Food Insecurity (05/17/2022)  Housing: Low Risk  (05/17/2022)  Transportation Needs: No Transportation Needs (05/17/2022)  Utilities: Not At Risk (05/17/2022)  Tobacco Use: Low  Risk  (05/20/2022)    Readmission Risk Interventions     No data to display

## 2022-05-23 NOTE — Progress Notes (Signed)
Subjective: 4 Days Post-Op s/p Procedure(s): LEFT PARTIAL HIP ARTHROPLASTY  Overall doing well, worried about extended stay at SNF, wants to get back to her independent living as quickly as possible. Minimal pain, only using tylenol. Working well with PT. No other complaints.      Objective:  PE: VITALS:   Vitals:   05/23/22 1032 05/23/22 1103 05/23/22 1106 05/23/22 1329  BP: 132/62 125/72 125/72 128/69  Pulse: 82 73 73 84  Resp: '20 20 20 20  '$ Temp: 97.7 F (36.5 C) 98.2 F (36.8 C) 98.2 F (36.8 C) 97.7 F (36.5 C)  TempSrc: Oral  Oral Oral  SpO2: 92%  92% 98%  Weight:      Height:        ABD soft Sensation intact distally Intact pulses distally Dorsiflexion/Plantar flexion intact Incision: scant drainage dressing with only a small amount of strike through noted.  Significant ecchymosis seen at left hip. Not tender to touch  LABS  Results for orders placed or performed during the hospital encounter of 05/17/22 (from the past 24 hour(s))  Type and screen [place]     Status: None (Preliminary result)   Collection Time: 05/23/22 12:55 AM  Result Value Ref Range   ABO/RH(D) AB POS    Antibody Screen NEG    Sample Expiration 05/26/2022,2359    Unit Number [id number]    Blood Component Type RBC LR PHER2    Unit division 00    Status of Unit ISSUED    Transfusion Status OK TO TRANSFUSE    Crossmatch Result      Compatible Performed at [facility], [address] [place], [place] [address]   CBC     Status: Abnormal   Collection Time: 05/23/22 12:57 AM  Result Value Ref Range   WBC 7.3 4.0 - 10.5 K/uL   RBC 2.52 (L) 3.87 - 5.11 MIL/uL   Hemoglobin 7.0 (L) 12.0 - 15.0 g/dL   HCT 21.6 (L) 36.0 - 46.0 %   MCV 85.7 80.0 - 100.0 fL   MCH 27.8 26.0 - 34.0 pg   MCHC 32.4 30.0 - 36.0 g/dL   RDW 14.9 11.5 - 15.5 %   Platelets 136 (L) 150 - 400 K/uL   nRBC 0.0 0.0 - 0.2 %  Basic metabolic panel     Status: Abnormal   Collection  Time: 05/23/22 12:57 AM  Result Value Ref Range   Sodium 137 135 - 145 mmol/L   Potassium 3.8 3.5 - 5.1 mmol/L   Chloride 106 98 - 111 mmol/L   CO2 21 (L) 22 - 32 mmol/L   Glucose, Bld 112 (H) 70 - 99 mg/dL   BUN 18 8 - 23 mg/dL   Creatinine, Ser 0.57 0.44 - 1.00 mg/dL   Calcium 8.0 (L) 8.9 - 10.3 mg/dL   GFR, Estimated >60 >60 mL/min   Anion gap 10 5 - 15  Prepare RBC (crossmatch)     Status: None   Collection Time: 05/23/22 10:30 AM  Result Value Ref Range   Order Confirmation      ORDER PROCESSED BY BLOOD BANK Performed at [facility], [address], [place],  [address]     No results found.  Assessment/Plan: Principal Problem:   Closed displaced fracture of left femoral neck (HCC) Active Problems:   Benign essential HTN   CAD (coronary artery disease), native coronary artery   Dyslipidemia, goal LDL below 70   PMR (  polymyalgia rheumatica) (HCC)   History of CVA (cerebrovascular accident)- 03/14/2022   DNR (do not resuscitate)/DNI(Do Not Intubate)   Hypokalemia   4 Days Post-Op s/p Procedure(s): LEFT PARTIAL HIP ARTHROPLASTY  Hbg dropped to 7.0 today. Dr. Mardelle Matte discussed, patient undergoing transfusion at this time.   Weightbearing: WBAT LLE Insicional and dressing care: Reinforce dressings as needed New dressing placed on 3/9. VTE prophylaxis: Will plan to restart her baseline ASA and plavix. Per original medicine H&P, at baseline she is on ASA 81 mg daily and plavix 75 mg daily with plan to stop her aspirin. Pain control: continue current regimen Follow - up plan: 2 weeks with Dr. Mardelle Matte Dispo: PT recommending SNF. Speaking with TOC today about options.    Contact information:   After hours and holidays please check [email or web address] for group call information for [facility]  [name] 05/23/2022, 1:32 PM

## 2022-05-23 NOTE — Progress Notes (Signed)
PROGRESS NOTE Yvonne Ballard  [id number] DOB: 1940-09-05 DOA: 05/17/2022 PCP: Yvonne Roger, MD  Brief Narrative/Hospital Course: 82 year old female history of stroke in January 2024, hypertension, hyperlipidemia, CAD, PMR admitted after mechanical fall.. Pt lives in ALF([facility]). Pt states she was walking with her walker to her bedroom to take a nap. Lost her balance and fell onto her left side.  In the ED uncontrolled hypertension 210/80 afebrile heart rate variable 55-102, on room air.  Labs with mild hypokalemia normocytic anemia 9.7.  Imaging chest x-ray unremarkable hip x-ray showed acute transverse fracture of the proximal left femoral neck with mild to moderate superolateral displacement of the distal fracture component and valgus angulation.  EKG showed NSR with PACs on admission review. Orthopedic surgery consulted and admitted.  Seen by cardiology for preop evaluation S/p left hip hemiarthroplasty by Dr Mardelle Matte 05/19/22   Subjective:  Seen and examined, Overnight vitals/labs/events reviewed patient has no complaints although she did have weakness yesterday when asked  She does have a bruise on her left hip surgical site Her hemoglobin has further trended down to 7 g  Assessment and Plan: Principal Problem:   Closed displaced fracture of left femoral neck ([place]) Active Problems:   Benign essential HTN   CAD (coronary artery disease), native coronary artery   Dyslipidemia, goal LDL below 70   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[place])   History of CVA (cerebrovascular accident)- 03/14/2022   DNR (do not resuscitate)/DNI(Do Not Intubate)   Hypokalemia   Closed displaced fracture of left femoral neck secondary to fall: s/p left hip hemiarthroplasty by Dr Mardelle Matte 05/19/22.  Surgically stable.  Continue current pain control with opiates/episodes, laxatives, PT OT.  DVT prophylaxis with aspirin Plavix x 4 weeks followed resumption of home Plavix alone for her stroke. She was supposed on  Plavix alone from recent stroke-patient does not prefer to be on subcu injection.  Discussed with Dr Mardelle Matte this am who also discussed with her and patient is agreeable for 1 unit  PRBC today  Recent CVA 03/14/2022: per discharge summary 1/5 patient was recommended to continue aspirin Plavix x 3 weeks>followed by Plavix alone.currently aspirin was added for DVT prophylaxis as per orthopedics see above she tells me she has not been very compliant-not taking her Plavix regularly. Of note patient had refused statin on last discahrge.  Tachyarrhythmia-SVT PACs: Likely due to pain and anemia in perioperative status. At times EKG mentions A-fib but on multiple review A-fib not found seen by cardiology 3/9, on Cardizem CD 120 mg bid> increased to 180 mg twice daily per cardiology appreciate input.  Monitoring telemetry.   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 Nausea vomiting-resolved.  [id number]. Dyslipidemia, goal LDL below 70:refused statin on last admit.  [id number]. Benign essential HTN: BP currently controlled. Uncontrolled on admission likely from pain. Cont  avaprol/cardizem Hypokalemia: Resolved.  ABLA due to#  1 Normocytic anemia: Hemoglobin further downtrending 7.0 gm-on admission was around 10 g range.  This is due to ABLA with #1. I advised 1 unit PRBC 3/10-see wanted to avoid till today > discussed extensively this morning she got upset and angry that hemoglobin is downtrending : She says she is fine with 1 unit blood transfusion understanding risks/benefits/alternatives> she has had multiple blood transfusion previously around her surgery. Recent Labs  Lab 05/19/22 1914 05/20/22 0125 05/21/22 0125 05/22/22 0103 05/23/22 [id number]  HGB 9.4* 8.7* 8.1* 7.4* 7.0*  HCT 29.4* 27.4* 25.7* 23.5* 21.6*    GOC :DNR Verified with  patient on admission  DVT prophylaxis: SCDs Start: 05/19/22 1721 SCDs Start: 05/17/22 2139 Code Status:   Code Status: DNR Family Communication: plan of care discussed with patient at bedside. Patient status is: Inpatient because of hip fracture Level of care: Telemetry Cardiac   Dispo: The patient is from: ALF            Anticipated disposition: SNF pending. Needing transfusion today   Objective: Vitals last 24 hrs: Vitals:   05/22/22 2322 05/23/22 0405 05/23/22 0730 05/23/22 0920  BP: (!) 143/85 (!) 146/79 (!) 143/65 (!) 149/79  Pulse: 65  68   Resp: (!) '21 15 16   '$ Temp: 97.8 F (36.6 C) 98.7 F (37.1 C) 98.3 F (36.8 C)   TempSrc: Axillary Oral Oral   SpO2: 98% 98% 97%   Weight:      Height:       Weight change:   Physical Examination: General exam: AAox3, weak,older appearing HEENT:Oral mucosa moist, Ear/Nose WNL grossly, dentition normal. Respiratory system: bilaterally clear BS, no use of accessory muscle Cardiovascular system: S1 & S2 +, regular rate,. Gastrointestinal system: Abdomen soft, NT,ND,BS+ Nervous System:Alert, awake, moving extremities and grossly nonfocal Extremities: LE ankle edema neg, left hip surgical site with Aquacel dressing in place, bruise present in groin and anterior thigh Skin:No rashes,no icterus. [id number] muscle bulk,tone, power   Medications reviewed:  Scheduled Meds:  aspirin EC  81 mg Oral Daily   Chlorhexidine Gluconate Cloth  6 each Topical Q0600   clopidogrel  75 mg Oral Daily   diltiazem  180 mg Oral BID   docusate sodium  100 mg Oral BID   famotidine  20 mg Oral Daily   feeding supplement  237 mL Oral BID BM   ferrous sulfate  325 mg Oral TID PC   irbesartan  75 mg Oral BID   multivitamin with minerals  1 tablet Oral Daily   mupirocin ointment  1 Application Nasal BID   senna  1 tablet Oral BID  Continuous Infusions:    Diet Order             Diet regular Room service appropriate? Yes; Fluid consistency: Thin   Diet effective now                  Intake/Output Summary (Last 24 hours) at 05/23/2022 0935 Last data filed at 05/22/2022 1330 Gross per 24 hour  Intake 240 ml  Output --  Net 240 ml    Net IO Since Admission: -837.25 mL [05/23/22 0935]  Wt Readings from Last 3 Encounters:  05/19/22 44 kg  05/04/22 44.2 kg  03/15/22 43.4 kg   Unresulted Labs (From admission, onward)     Start     Ordered   05/26/22 0500  Creatinine, serum  (enoxaparin (LOVENOX)  CrCl >/= 30 mL/min  )  Weekly,   R     Comments: while on enoxaparin therapy.   Question:  Specimen collection method  Answer:  Lab=Lab collect   05/19/22 1720          Data Reviewed: I have personally reviewed following labs and imaging studies CBC: Recent Labs  Lab 05/19/22 1914 05/20/22 0125 05/21/22 0125 05/22/22 0103 05/23/22 0057  WBC 14.2* 10.5 7.6 6.6 7.3  HGB 9.4* 8.7* 8.1* 7.4* 7.0*  HCT 29.4* 27.4* 25.7* 23.5* 21.6*  MCV 86.2 86.4 85.4 85.8  85.7  PLT 194 181 143* 134* 136*    Basic Metabolic Panel: Recent Labs  Lab 05/19/22 0130 05/19/22 1914 05/20/22 0125 05/21/22 0125 05/22/22 0103 05/23/22 0057  NA 135  --  132* 138 136 137  K 3.8  --  4.2 3.3* 3.6 3.8  CL 99  --  99 104 106 106  CO2 25  --  '26 27 24 '$ 21*  GLUCOSE 133*  --  147* 104* 107* 112*  BUN 19  --  25* '19 14 18  '$ CREATININE 0.76 0.70 0.76 0.60 0.51 0.57  CALCIUM 9.1  --  8.2* 8.1* 7.9* 8.0*    Recent Results (from the past 240 hour(s))  Surgical pcr screen     Status: Abnormal   Collection Time: 05/18/22  6:05 PM   Specimen: Nasal Mucosa; Nasal Swab  Result Value Ref Range Status   MRSA, PCR POSITIVE (A) NEGATIVE Final    Comment: RESULT CALLED TO, READ BACK BY AND VERIFIED WITH: RN NDEYA ON 05/18/22 @ 2057 BY DRT    Staphylococcus aureus POSITIVE (A) NEGATIVE Final    Comment: (NOTE) The Xpert SA Assay (FDA approved for NASAL specimens in patients 33 years of age and older), is one component of a comprehensive surveillance  program. It is not intended to diagnose infection nor to guide or monitor treatment. Performed at [facility], [place] [address], [place], [place] [address]     Antimicrobials: Anti-infectives (From admission, onward)    Start     Dose/Rate Route Frequency Ordered Stop   05/19/22 2100  ceFAZolin (ANCEF) IVPB 2g/100 mL premix        2 g 200 mL/hr over 30 Minutes Intravenous Every 6 hours 05/19/22 1720 05/20/22 0530   05/19/22 1315  ceFAZolin (ANCEF) IVPB 2g/100 mL premix        2 g 200 mL/hr over 30 Minutes Intravenous On call to O.R. 05/19/22 1255 05/19/22 1419     Culture/Microbiology    Component Value Date/Time   SDES URINE, CLEAN CATCH 04/19/2014 0610   SPECREQUEST NONE 04/19/2014 0610   CULT  04/19/2014 0610    Multiple bacterial morphotypes present, none predominant. Suggest appropriate recollection if clinically indicated. Performed at [facility] 04/21/2014 FINAL 04/19/2014 [id number]  Radiology Studies: No results found.  LOS: 6 days  Antonieta Pert, MD [facility] 05/23/2022, 9:35 AM

## 2022-05-23 NOTE — Progress Notes (Signed)
Rounding Note    Patient Name: Yvonne Ballard Date of Encounter: 05/23/2022  [place] Cardiologist: Peter Martinique, MD   Subjective   Asymptomatic SVT that as improved.  Baseline heart rate has improved.  Patient feels better.  Daugther at bedside  Inpatient Medications    Scheduled Meds:  sodium chloride   Intravenous Once   aspirin EC  81 mg Oral Daily   Chlorhexidine Gluconate Cloth  6 each Topical Q0600   clopidogrel  75 mg Oral Daily   diltiazem  180 mg Oral BID   docusate sodium  100 mg Oral BID   famotidine  20 mg Oral Daily   feeding supplement  237 mL Oral BID BM   ferrous sulfate  325 mg Oral TID PC   furosemide  20 mg Oral Once   irbesartan  75 mg Oral BID   multivitamin with minerals  1 tablet Oral Daily   mupirocin ointment  1 Application Nasal BID   senna  1 tablet Oral BID   Continuous Infusions:   PRN Meds: acetaminophen, alum & mag hydroxide-simeth, bisacodyl, hydrALAZINE, HYDROcodone-acetaminophen, magnesium citrate, menthol-cetylpyridinium **OR** phenol, morphine injection, ondansetron **OR** ondansetron (ZOFRAN) IV, polyethylene glycol, traMADol   Vital Signs    Vitals:   05/22/22 2322 05/23/22 0405 05/23/22 0730 05/23/22 0920  BP: (!) 143/85 (!) 146/79 (!) 143/65 (!) 149/79  Pulse: 65  68   Resp: (!) '21 15 16   '$ Temp: 97.8 F (36.6 C) 98.7 F (37.1 C) 98.3 F (36.8 C)   TempSrc: Axillary Oral Oral   SpO2: 98% 98% 97%   Weight:      Height:        Intake/Output Summary (Last 24 hours) at 05/23/2022 1027 Last data filed at 05/22/2022 1330 Gross per 24 hour  Intake 240 ml  Output --  Net 240 ml      05/19/2022   12:37 PM 05/17/2022    5:22 PM 05/04/2022    3:33 PM  Last 3 Weights  Weight (lbs) 97 lb 97 lb 6.4 oz 97 lb 6.4 oz  Weight (kg) 43.999 kg 44.18 kg 44.18 kg      Telemetry    Sinus arrhythmia with frequent PACs.  Runs of atrial tachycardia RBBB pattern - Personally Reviewed  Physical Exam   GEN: No acute  distress.  Neck: No JVD Cardiac: regular rhythm, no tachycardia Respiratory: Clear to auscultation bilaterally GI: Soft, nontender, non-distended  Psych: Calm  Labs    High Sensitivity Troponin:  No results for input(s): "TROPONINIHS" in the last 720 hours.   Chemistry Recent Labs  Lab 05/21/22 0125 05/22/22 0103 05/23/22 0057  NA 138 136 137  K 3.3* 3.6 3.8  CL 104 106 106  CO2 27 24 21*  GLUCOSE 104* 107* 112*  BUN '19 14 18  '$ CREATININE 0.60 0.51 0.57  CALCIUM 8.1* 7.9* 8.0*  GFRNONAA >60 >60 >60  ANIONGAP '7 6 10    '$ Lipids No results for input(s): "CHOL", "TRIG", "HDL", "LABVLDL", "LDLCALC", "CHOLHDL" in the last 168 hours.  Hematology Recent Labs  Lab 05/21/22 0125 05/22/22 0103 05/23/22 0057  WBC 7.6 6.6 7.3  RBC 3.01* 2.74* 2.52*  HGB 8.1* 7.4* 7.0*  HCT 25.7* 23.5* 21.6*  MCV 85.4 85.8 85.7  MCH 26.9 27.0 27.8  MCHC 31.5 31.5 32.4  RDW 14.5 14.8 14.9  PLT 143* 134* 136*   Thyroid No results for input(s): "TSH", "FREET4" in the last 168 hours.  BNPNo results for input(s): "BNP", "  PROBNP" in the last 168 hours.  DDimer No results for input(s): "DDIMER" in the last 168 hours.   Radiology    No results found.  Cardiac Studies   Cardiac Studies & Procedures       ECHOCARDIOGRAM  ECHOCARDIOGRAM COMPLETE 03/15/2022  Narrative ECHOCARDIOGRAM REPORT    Patient Name:   Shalon Hallen Date of Exam: 03/15/2022 Medical Rec #:  [id number]      Height:       60.0 in Accession #:    [id number]     Weight:       95.2 lb Date of Birth:  10-17-1940     BSA:          1.362 m Patient Age:    82 years       BP:           153/84 mmHg Patient Gender: F              HR:           71 bpm. Exam Location:  Inpatient  Procedure: 2D Echo, Color Doppler and Cardiac Doppler  Indications:    Stroke i63.9  History:        Patient has prior history of Echocardiogram examinations, most recent 11/16/2019. CAD; Risk Factors:Hypertension and Dyslipidemia. 03/31/14 [place]  Procedure with 52m Magna Ease Bioprosthetic Aortic Valve. Aortic Valve: valve is present in the aortic position.  Sonographer:    ERaquel SarnaSenior RDCS Referring Phys: PLequita Halt  Sonographer Comments: Technically difficult due to thin body habitus. IMPRESSIONS   1. Left ventricular ejection fraction, by estimation, is 55 to 60%. The left ventricle has normal function. The left ventricle demonstrates regional wall motion abnormalities (see scoring diagram/findings for description). There is mild concentric left ventricular hypertrophy. Left ventricular diastolic parameters are consistent with Grade II diastolic dysfunction (pseudonormalization). Elevated left atrial pressure. 2. Right ventricular systolic function is mildly reduced. The right ventricular size is normal. There is normal pulmonary artery systolic pressure. The estimated right ventricular systolic pressure is [id number]. 3. Left atrial size was mildly dilated. 4. 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 6. The aortic valve has been repaired/replaced. Aortic valve regurgitation is not visualized. No aortic stenosis is present. There is a valve present in the aortic position. Echo findings are consistent with normal structure and function of the aortic valve prosthesis. Aortic valve mean gradient measures 11.0 mmHg. Aortic valve Vmax measures 2.31 m/s. Aortic valve acceleration time measures 49 msec. 7. Aortic root/ascending aorta has been repaired/replaced and dilatation noted. 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  Comparison(s): No significant change from prior study. Prior images reviewed side by side.  FINDINGS Left Ventricle: Left ventricular ejection fraction, by estimation, is 55 to 60%. The left ventricle has normal function. The left ventricle demonstrates regional wall motion  abnormalities. The left ventricular internal cavity size was normal in size. There is mild concentric left ventricular hypertrophy. Abnormal (paradoxical) septal motion consistent with post-operative status. Left ventricular diastolic parameters are consistent with Grade II diastolic dysfunction (pseudonormalization). Elevated left atrial pressure.  Right Ventricle: The right ventricular size is normal. No increase in right ventricular wall thickness. Right ventricular systolic function is mildly reduced. There is normal pulmonary artery systolic pressure. The tricuspid regurgitant velocity is 2.33 m/s, and with an assumed right atrial pressure of 3 mmHg, the estimated right ventricular systolic  pressure is 24.7 mmHg.  Left Atrium: Left atrial size was mildly dilated.  Right Atrium: Right atrial size was normal in size. Prominent Chiari network.  Pericardium: There is no evidence of pericardial effusion.  Mitral Valve: The mitral valve is myxomatous. Mild mitral valve regurgitation.  Tricuspid Valve: 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  Aortic Valve: The aortic valve has been repaired/replaced. Aortic valve regurgitation is not visualized. No aortic stenosis is present. Aortic valve mean gradient measures 11.0 mmHg. Aortic valve peak gradient measures 21.3 mmHg. Aortic valve area, by VTI measures 1.40 cm. There is a valve present in the aortic position. Echo findings are consistent with normal structure and function of the aortic valve prosthesis.  Pulmonic Valve: The pulmonic valve was grossly normal. Pulmonic valve regurgitation is not visualized.  Aorta: The aortic root and ascending aorta are structurally normal, with no evidence of dilitation, the aortic root/ascending aorta has been repaired/replaced and aortic dilatation noted. 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  IAS/Shunts: No atrial level shunt detected by color flow Doppler.   LEFT VENTRICLE PLAX 2D LVIDd:         3.90 cm   Diastology LVIDs:         2.20 cm   LV e' medial:    5.55 cm/s LV PW:         1.10 cm   LV E/e' medial:  16.6 LV IVS:        1.25 cm   LV e' lateral:   6.09 cm/s LVOT diam:     2.00 cm   LV E/e' lateral: 15.1 LV SV:         54 LV SV Index:   40 LVOT Area:     3.14 cm   RIGHT VENTRICLE RV S prime:     8.81 cm/s TAPSE (M-mode): 1.5 cm  LEFT ATRIUM             Index        RIGHT ATRIUM           Index LA diam:        3.20 cm 2.35 cm/m   RA Area:     15.70 cm LA Vol (A2C):   42.7 ml 31.36 ml/m  RA Volume:   33.20 ml  24.38 ml/m LA Vol (A4C):   43.0 ml 31.58 ml/m LA Biplane Vol: 44.2 ml 32.46 ml/m AORTIC VALVE AV Area (Vmax):    1.23 cm AV Area (Vmean):   1.30 cm AV Area (VTI):     1.40 cm AV Vmax:           231.00 cm/s AV Vmean:          160.000 cm/s AV VTI:            0.387 m AV Peak Grad:      21.3 mmHg AV Mean Grad:      11.0 mmHg LVOT Vmax:         90.80 cm/s LVOT Vmean:        66.100 cm/s LVOT VTI:          0.172 m LVOT/AV VTI ratio: 0.44  AORTA Ao Root diam: 3.40 cm Ao Asc diam:  2.70 cm  MITRAL VALVE               TRICUSPID VALVE MV Area (PHT): 3.45 cm    TR  Peak grad:   21.7 mmHg MV Decel Time: 220 msec    TR Vmax:        233.00 cm/s MV E velocity: 92.00 cm/s MV A velocity: 68.10 cm/s  SHUNTS MV E/A ratio:  1.35        Systemic VTI:  0.17 m Systemic Diam: 2.00 cm  Mihai Croitoru MD Electronically signed by Sanda Klein MD Signature Date/Time: 03/15/2022/10:48:38 AM    Final              Patient Profile     Keashia Prine is a 82 y.o. female with a hx of diastolic dysfunction without heart failure, aortic stenosis status post AVR, ascending aortic aneurysm (6.5cm), mild nonobstructive CAD, CVA, HTN, HLD, PMR, who is being seen as a re-round on 3/9 at the request of Dr. Lupita Leash for  tachy-arrhythmia.  Assessment & Plan    Paroxsymal atrial tachycardia with frequent PACs PACs - continue to diltiazem 180 BID, if worsening tachycardia start metoprolol 25 mg PO BID  Hypertension - Patient's BP generally at goal/baseline. - if hypotension stop irbestartan  Nonobstructive CAD Hyperlipidemia - Patient with hx non-obstructive CAD found with SAVR workup (prior [facility]). No anginal symptoms this admission. Patient not on medical therapy, expresses desire for lifestyle management.  Hx cardiomyopathy, EF now normalized post SAVR - LVEF 55-60% as of 03/15/22 TTE. No evidence of volume overload on exam.   CVA - Anti-platelet regimen per primary team/neurology  Anxiety - discussed at length with patient and daughter; I would worry about fall risk on standing Xanax (prescribed from prior admission)  [place] will sign off.   [facility] changed this admission. Will arrange f/u with Dr. Martinique or team       For questions or updates, please contact [place] Please consult [email or web address] for contact info under      Rudean Haskell, MD [place]  [address] [place], [place] [address] [phone]  10:27 AM

## 2022-05-23 NOTE — [place] FL2 (Signed)
[place] MEDICAID FL2 LEVEL OF CARE FORM     IDENTIFICATION  Patient Name: Yvonne Ballard Birthdate: [birth year] Sex: female Admission Date (Current Location): 05/17/2022  [facility] and [place] Number:  Herbalist and Address:  [facility] [place]. [facility], [place] [address], [place], [place] [address]      Provider Number: [id number]  Attending Physician Name and Address:  Antonieta Pert, MD  Relative Name and Phone Number:       Current Level of Care: Hospital Recommended Level of Care: [place] Prior Approval Number:    Date Approved/Denied:   PASRR Number: [id number]  Discharge Plan: SNF    Current Diagnoses: Patient Active Problem List   Diagnosis Date Noted   Closed displaced fracture of left femoral neck ([place]) 05/17/2022   History of CVA (cerebrovascular accident)- 03/14/2022 05/17/2022   DNR (do not resuscitate)/DNI(Do Not Intubate) 05/17/2022   Hypokalemia 05/17/2022   CRAO (central retinal artery occlusion), right 11/15/2019   Weight loss    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HCC)    Hypertension    History of hiatal hernia    Family history of adverse reaction to anesthesia    Ascending aortic aneurysm ([place])    Anxiety disorder    Abnormal ECG    Anxiety 02/07/2017   Hiatal hernia 02/07/2017   CAD (coronary artery disease), native coronary artery 07/26/2014   Dyslipidemia, goal LDL below 70 07/26/2014   S/P AVR (aortic valve replacement)    Aortic aneurysm, 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[place]) 03/31/2014   Benign essential HTN 03/18/2014   LVH (left ventricular hypertrophy) due to hypertensive disease 03/18/2014   Heart murmur 03/18/2014   Coronary artery disease 03/14/2014    Orientation RESPIRATION BLADDER Height & Weight     Self, Time, Situation, Place  Normal Continent Weight: 97 lb (44 kg) Height:  5' (152.4 cm)  BEHAVIORAL SYMPTOMS/MOOD NEUROLOGICAL BOWEL NUTRITION STATUS      Continent Diet (please see discharge summary)  AMBULATORY  STATUS COMMUNICATION OF NEEDS Skin   Limited Assist Verbally Surgical wounds (closed Hip incision)                       Personal Care Assistance Level of Assistance  Bathing, Feeding, Dressing Bathing Assistance: Maximum assistance Feeding assistance: Independent Dressing Assistance: Maximum assistance     Functional Limitations Info  Sight, Hearing, Speech Sight Info: Impaired Hearing Info: Adequate Speech Info: Adequate    SPECIAL CARE FACTORS FREQUENCY  PT (By licensed PT), OT (By licensed OT)     PT Frequency: 5x per week OT Frequency: 5x per week            Contractures Contractures Info: Not present    Additional Factors Info  Code Status, Allergies Code Status Info: DNR Allergies Info: NKA           Current Medications (05/23/2022):  This is the current hospital active medication list Current Facility-Administered Medications  Medication Dose Route Frequency Provider Last Rate Last Admin   acetaminophen (TYLENOL) tablet 325-650 mg  325-650 mg Oral Q6H PRN Merlene Pulling K, PA-C       alum & mag hydroxide-simeth (MAALOX/MYLANTA) 200-200-20 MG/5ML suspension 30 mL  30 mL Oral Q4H PRN Merlene Pulling K, PA-C       aspirin EC tablet 81 mg  81 mg Oral Daily [name], PA-C   81 mg at 05/23/22 [id number]   bisacodyl (DULCOLAX) suppository 10 mg  10 mg Rectal Daily PRN Merlene Pulling  K, PA-C       Chlorhexidine Gluconate Cloth 2 % PADS 6 each  6 each Topical Q0600 [name], PA-C   6 each at 05/21/22 [id number]   clopidogrel (PLAVIX) tablet 75 mg  75 mg Oral Daily [name], PA-C   75 mg at 05/23/22 [id number]   diltiazem (CARDIZEM CD) 24 hr capsule 180 mg  180 mg Oral BID Gasper Sells, Mahesh A, MD   180 mg at 05/23/22 0920   docusate sodium (COLACE) capsule 100 mg  100 mg Oral BID Merlene Pulling K, PA-C   100 mg at 05/21/22 2226   famotidine (PEPCID) tablet 20 mg  20 mg Oral Daily Kc, Maren Beach, MD   20 mg at 05/23/22 0917   feeding supplement (ENSURE ENLIVE /  ENSURE PLUS) liquid 237 mL  237 mL Oral BID BM Merlene Pulling K, PA-C   237 mL at 05/22/22 [id number]   ferrous sulfate tablet 325 mg  325 mg Oral TID PC Merlene Pulling K, PA-C   325 mg at 05/21/22 0946   furosemide (LASIX) tablet 20 mg  20 mg Oral Once Antonieta Pert, MD       hydrALAZINE (APRESOLINE) injection 10 mg  10 mg Intravenous Q4H PRN Merlene Pulling K, PA-C   10 mg at 05/18/22 1338   HYDROcodone-acetaminophen (NORCO/VICODIN) 5-325 MG per tablet 1-2 tablet  1-2 tablet Oral Q6H PRN Merlene Pulling K, PA-C       irbesartan (AVAPRO) tablet 75 mg  75 mg Oral BID Brown, Blaine K, PA-C   75 mg at 05/23/22 0920   magnesium citrate solution 1 Bottle  1 Bottle Oral Once PRN Merlene Pulling K, PA-C       menthol-cetylpyridinium (CEPACOL) lozenge 3 mg  1 lozenge Oral PRN Merlene Pulling K, PA-C       Or   phenol (CHLORASEPTIC) mouth spray 1 spray  1 spray Mouth/Throat PRN Merlene Pulling K, PA-C       morphine (PF) 2 MG/ML injection 0.5-1 mg  0.5-1 mg Intravenous Q2H PRN Merlene Pulling K, PA-C       multivitamin with minerals tablet 1 tablet  1 tablet Oral Daily [name], PA-C   1 tablet at 05/23/22 [id number]   mupirocin ointment (BACTROBAN) 2 % 1 Application  1 Application Nasal BID Merlene Pulling K, PA-C   1 Application at [id number]   ondansetron (ZOFRAN) tablet 4 mg  4 mg Oral Q6H PRN Merlene Pulling K, PA-C       Or   ondansetron [facility]) injection 4 mg  4 mg Intravenous Q6H PRN Merlene Pulling K, PA-C       polyethylene glycol (MIRALAX / GLYCOLAX) packet 17 g  17 g Oral Daily PRN Owens Shark, Blaine K, PA-C       senna (SENOKOT) tablet 8.6 mg  1 tablet Oral BID Owens Shark, Blaine K, PA-C   8.6 mg at 05/20/22 1057   traMADol (ULTRAM) tablet 50 mg  50 mg Oral Q6H PRN [name], PA-C         Discharge Medications: Please see discharge summary for a list of discharge medications.  Relevant Imaging Results:  Relevant Lab Results:   Additional Information SSN [id number]  Vinie Sill, LCSW

## 2022-05-23 NOTE — TOC Progression Note (Signed)
Transition of Care [facility]) - Progression Note    Patient Details  Name: Yvonne Ballard MRN: [id number] Date of Birth: [birth year]  Transition of Care [facility]) CM/SW [place], [place] Phone Number: 05/23/2022, 12:00 PM  Clinical Narrative:     CSW met with patient at bedside.  CSW introduced self and explained role.  CSW discussed PT/OT recommendations- per patient, she is aware of recommendations and is agreeable to short term rehab at [facility]. Preferred SNF is [facility] or [facility]. CSW explained the SNF process.  CSW informed, patient's daughter wanted to talk w/ CSW but daughter was off the unit at the time. CSW called, left voice message.   Thurmond Butts, MSW, LCSW Clinical Social Worker    Expected Discharge Plan: Skilled Nursing Facility Barriers to Discharge: Continued Medical Work up  Expected Discharge Plan and Services In-house Referral: Clinical Social Work Discharge Planning Services: CM Consult Post Acute Care Choice: Home Health, [place] Living arrangements for the past 2 months: Apartment                                       Social Determinants of Health (SDOH) Interventions SDOH Screenings   Food Insecurity: No Food Insecurity (05/17/2022)  Housing: Low Risk  (05/17/2022)  Transportation Needs: No Transportation Needs (05/17/2022)  Utilities: Not At Risk (05/17/2022)  Tobacco Use: Low Risk  (05/20/2022)    Readmission Risk Interventions     No data to display

## 2022-05-24 LAB — TYPE AND SCREEN
ABO/RH(D): AB POS
Antibody Screen: NEGATIVE
Unit division: 0

## 2022-05-24 LAB — BPAM RBC
Blood Product Expiration Date: 202403232359
ISSUE DATE / TIME: 202403111042
Unit Type and Rh: 8400

## 2022-05-24 MED ORDER — ASPIRIN 81 MG PO TBEC: 81.0000 mg | DELAYED_RELEASE_TABLET | Freq: Every day | ORAL | 0 refills | Status: AC

## 2022-05-24 MED ORDER — FERROUS SULFATE 325 (65 FE) MG PO TABS: 325.0000 mg | ORAL_TABLET | Freq: Three times a day (TID) | ORAL | 3 refills | Status: DC

## 2022-05-24 MED ORDER — SENNA 8.6 MG PO TABS: 1.0000 | ORAL_TABLET | Freq: Two times a day (BID) | ORAL | 0 refills | Status: DC

## 2022-05-24 MED ORDER — CLOPIDOGREL BISULFATE 75 MG PO TABS: 75.0000 mg | ORAL_TABLET | Freq: Every day | ORAL | Status: DC

## 2022-05-24 MED ORDER — DOCUSATE SODIUM 100 MG PO CAPS: 100.0000 mg | ORAL_CAPSULE | Freq: Two times a day (BID) | ORAL | 0 refills | Status: DC

## 2022-05-24 MED ORDER — ENSURE ENLIVE PO LIQD: 237.0000 mL | Freq: Two times a day (BID) | ORAL | 12 refills | Status: DC

## 2022-05-24 MED ORDER — ADULT MULTIVITAMIN W/MINERALS CH: 1.0000 | ORAL_TABLET | Freq: Every day | ORAL | Status: AC

## 2022-05-24 NOTE — Discharge Summary (Signed)
Physician Discharge Summary  Yvonne Ballard [id number] DOB: [birth year] DOA: 05/17/2022  PCP: [name], MD  Admit date: 05/17/2022 Discharge date: 05/24/2022 Recommendations for Outpatient Follow-up:  Follow up with PCP in 1 weeks-call for appointment Please obtain BMP/CBC in one week Follow-up with Dr. Mardelle Matte 2 weeks post surgery  Discharge Dispo: SNF Discharge Condition: Stable Code Status:   Code Status: DNR Diet recommendation:  Diet Order             Diet regular Room service appropriate? Yes; Fluid consistency: Thin  Diet effective now                    Brief/Interim Summary: 82 year old female history of stroke in January 2024, hypertension, hyperlipidemia, CAD, PMR admitted after mechanical fall.. Pt lives in ALF([facility]). Pt states she was walking with her walker to her bedroom to take a nap. Lost her balance and fell onto her left side.  In the ED uncontrolled hypertension 210/80 afebrile heart rate variable 55-102, on room air.  Labs with mild hypokalemia normocytic anemia 9.7.  Imaging chest x-ray unremarkable hip x-ray showed acute transverse fracture of the proximal left femoral neck with mild to moderate superolateral displacement of the distal fracture component and valgus angulation.  EKG showed NSR with PACs on admission review. Orthopedic surgery consulted and admitted.  Seen by cardiology for preop evaluation.S/p left hip hemiarthroplasty by Dr Mardelle Matte 05/19/22. Post op anemia hemoglobin down to 7 g given 1 PRBC improved to 8.7, SNF has been approved has been cleared for discharge by orthopedic   Discharge Diagnoses:  Principal Problem:   Closed displaced fracture of left femoral neck ([place]) Active Problems:   Benign essential HTN   CAD (coronary artery disease), native coronary artery   Dyslipidemia, goal LDL below 70   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HCC)   History of CVA (cerebrovascular accident)- 03/14/2022   DNR (do not resuscitate)/DNI(Do Not  Intubate)   Hypokalemia  Closed displaced fracture of left femoral neck secondary to fall: s/p left hip hemiarthroplasty by Dr Mardelle Matte 05/19/22.  Surgically stable.  Continue current pain control with opiates/episodes, laxatives, PT OT.  DVT prophylaxis with aspirin Plavix x 4 weeks followed by Plavix alone for her stroke.he was supposed on Plavix alone from recent stroke-patient does not prefer to be on subcu injection.  Discussed with Dr Mardelle Matte- awaiting SNF.  Recent CVA 03/14/2022: per discharge summary 1/5 patient was recommended to continue aspirin Plavix x 3 weeks>followed by Plavix alone.currently aspirin was added for DVT prophylaxis as per orthopedics see above she tells me she has not been very compliant-not taking her Plavix regularly. Of note patient had refused statin on last discahrge.  Tachyarrhythmia-SVT PACs: Likely due to pain and anemia in perioperative status. At times EKG mentions A-fib but on multiple review A-fib not found seen by cardiology 3/9, on Cardizem CD 120 mg bid> increased to 180 mg twice daily-heart rate is stable, if has persistently elevated heart rate or any kind of SVT can add metoprolol per cardiology.  Outpatient follow-up is arranged.   Chronic heart failure with preserved EF: LVEF 55 to 60% with RWMA in Jan-appears euvolemic. History of thoracic aortic aneurysm/Aortic stenosis status post biosynthetic TAVR: stable w/ normal echo in jan. Nausea vomiting-resolved.  [id number]. Dyslipidemia, goal LDL below 70:refused statin on last admit.  Nonobstructive [id number]. Benign essential HTN: BP remains stable continue current cardizem at increased rate and Avapro.   Hypokalemia: Resolved.  ABLA due to#  1 Normocytic anemia: Hemoglobin  further downtrending 7.0 gm-on admission was around 10 g range.  S/p 1 unit prbc-hb up at 8.7gm. GOC :DNR Verified with patient on admission  Consults: Orthopedics, cardiology Subjective: Alert awake oriented no motor weakness  feels much improved after getting blood transfusion and she feels she is is ready for discharge to rehab today  Discharge Exam: Vitals:   05/24/22 0445 05/24/22 0738  BP: (!) 156/74 (!) 147/77  Pulse: 75   Resp:  17  Temp: 98.9 F (37.2 C) 98 F (36.7 C)  SpO2:     General: Pt is alert, awake, not in acute distress Cardiovascular: RRR, S1/S2 +, no rubs, no gallops Respiratory: CTA bilaterally, no wheezing, no rhonchi Abdominal: Soft, NT, ND, bowel sounds + Extremities: no edema, no cyanosis  Discharge Instructions  Discharge Instructions     Discharge instructions   Complete by: As directed    Please call call MD or return to ER for similar or worsening recurring problem that brought you to hospital or if any fever,nausea/vomiting,abdominal pain, uncontrolled pain, chest pain,  shortness of breath or any other alarming symptoms.  Please follow-up your doctor as instructed in a week time and call the office for appointment.  Please avoid alcohol, smoking, or any other illicit substance and maintain healthy habits including taking your regular medications as prescribed.  You were cared for by a hospitalist during your hospital stay. If you have any questions about your discharge medications or the care you received while you were in the hospital after you are discharged, you can call the unit and ask to speak with the hospitalist on call if the hospitalist that took care of you is not available.  Once you are discharged, your primary care physician will handle any further medical issues. Please note that NO REFILLS for any discharge medications will be authorized once you are discharged, as it is imperative that you return to your primary care physician (or establish a relationship with a primary care physician if you do not have one) for your aftercare needs so that they can reassess your need for medications and monitor your lab values   Increase activity slowly   Complete by: As  directed       Allergies as of 05/24/2022   No Known Allergies      Medication List     STOP taking these medications    diltiazem 120 MG 24 hr capsule Commonly known as: CARDIZEM CD       TAKE these medications    aspirin EC 81 MG tablet Take 1 tablet (81 mg total) by mouth daily for 24 doses. Swallow whole. Start taking on: May 25, 2022   clopidogrel 75 MG tablet Commonly known as: PLAVIX Take 1 tablet (75 mg total) by mouth daily. Start taking on: May 25, 2022   diltiazem 60 MG tablet Commonly known as: CARDIZEM Take 30 mg by mouth daily as needed (SBP > 150).   docusate sodium 100 MG capsule Commonly known as: COLACE Take 1 capsule (100 mg total) by mouth 2 (two) times daily.   feeding supplement Liqd Take 237 mLs by mouth 2 (two) times daily between meals.   ferrous sulfate 325 (65 FE) MG tablet Take 1 tablet (325 mg total) by mouth 3 (three) times daily after meals.   irbesartan 150 MG tablet Commonly known as: AVAPRO TAKE 1/2 TABLET TWICE A DAY. What changed:  how much to take how to take this when to take this additional instructions  multivitamin with minerals Tabs tablet Take 1 tablet by mouth daily. Start taking on: May 25, 2022   senna 8.6 MG Tabs tablet Commonly known as: SENOKOT Take 1 tablet (8.6 mg total) by mouth 2 (two) times daily.   TRIMO-SAN [place] Place 1 application  vaginally daily as needed (vaginal health).        Follow-up Information     Lenna Sciara, NP Follow up on 06/03/2022.   Specialties: Cardiology, Family Medicine Why: at 8:25am for your cardiology follow up Contact information: [address] [place] [address] [phone]         Marchia Bond, MD Follow up in 1 week(s).   Specialty: Orthopedic Surgery Contact information: [address] [place] [address] [phone]         Nona Dell, Corene Cornea, MD Follow up in 1 week(s).   Specialty: Internal  Medicine Contact information: [address] [place] [address] [phone]                No Known Allergies  The results of significant diagnostics from this hospitalization (including imaging, microbiology, ancillary and laboratory) are listed below for reference.    Microbiology: Recent Results (from the past 240 hour(s))  Surgical pcr screen     Status: Abnormal   Collection Time: 05/18/22  6:05 PM   Specimen: Nasal Mucosa; Nasal Swab  Result Value Ref Range Status   MRSA, PCR POSITIVE (A) NEGATIVE Final    Comment: RESULT CALLED TO, READ BACK BY AND VERIFIED WITH: RN NDEYA ON 05/18/22 @ 2057 BY DRT    Staphylococcus aureus POSITIVE (A) NEGATIVE Final    Comment: (NOTE) The Xpert SA Assay (FDA approved for NASAL specimens in patients 62 years of age and older), is one component of a comprehensive surveillance program. It is not intended to diagnose infection nor to guide or monitor treatment. Performed at [facility], [place] [address], [place], [place] [address]     Procedures/Studies: DG Hip Port [place] With Pelvis 1V Left  Result Date: 05/19/2022 CLINICAL DATA:  Status post left hip arthroplasty. EXAM: DG HIP (WITH OR WITHOUT PELVIS) 1V PORT LEFT COMPARISON:  May 17, 2022. FINDINGS: Status post left hip hemiarthroplasty. Expected postoperative changes are noted in the surrounding soft tissues. IMPRESSION: Status post left hip hemiarthroplasty. Electronically Signed   By: Marijo Conception M.D.   On: 05/19/2022 16:20   DG FEMUR MIN 2 VIEWS LEFT  Result Date: 05/17/2022 CLINICAL DATA:  Hip fracture EXAM: LEFT FEMUR 2 VIEWS COMPARISON:  05/17/2022 FINDINGS: There is a left femoral neck fracture with varus angulation. Old left superior and inferior pubic rami fractures. No subluxation or dislocation. No additional femoral abnormality. No joint effusion in the left knee. IMPRESSION: Left femoral neck fracture with varus angulation. Electronically Signed   By:  Rolm Baptise M.D.   On: 05/17/2022 19:08   DG Hip [place] or [place] Pelvis 1 View Left  Result Date: 05/17/2022 CLINICAL DATA:  Level 2 trauma.  Fall.  Left hip injury. EXAM: DG HIP (WITH OR WITHOUT PELVIS) 1V PORT LEFT COMPARISON:  Pelvis and left hip radiographs 09/15/2016 FINDINGS: There is diffuse decreased bone mineralization. Interval healing of the previously seen minimally displaced fracture at the junction of the left superior pubic ramus and the left acetabulum in the mid left inferior pubic ramus. There is a new transverse fracture of the proximal left femoral neck with approximately 11 mm  lateral displacement and mild superior displacement of the distal fracture component with respect to the proximal fracture component. Mild-to-moderate varus angulation. The bilateral femoroacetabular joint spaces are maintained. Severe pubic symphysis joint space narrowing. Moderate atherosclerotic calcifications. IMPRESSION: Acute transverse fracture of the proximal left femoral neck with mild-to-moderate superolateral displacement of the distal fracture component and mild to moderate varus angulation. Electronically Signed   By: Yvonne Kendall M.D.   On: 05/17/2022 17:43   DG Chest Portable 1 View  Result Date: 05/17/2022 CLINICAL DATA:  Pain after fall EXAM: PORTABLE CHEST 1 VIEW COMPARISON:  Chest x-ray November 15, 2019 FINDINGS: The cardiomediastinal silhouette is stable with a markedly tortuous thoracic aorta. No pneumothorax. The lungs are clear. No acute abnormalities identified. IMPRESSION: No active disease. Electronically Signed   By: Dorise Bullion III M.D.   On: 05/17/2022 17:39    Labs: BNP (last 3 results) No results for input(s): "BNP" in the last 8760 hours. Basic Metabolic Panel: Recent Labs  Lab 05/19/22 0130 05/19/22 1914 05/20/22 0125 05/21/22 0125 05/22/22 0103 05/23/22 0057  NA 135  --  132* 138 136 137  K 3.8  --  4.2 3.3* 3.6 3.8  CL 99  --  99 104 106 106  CO2 25  --   '26 27 24 '$ 21*  GLUCOSE 133*  --  147* 104* 107* 112*  BUN 19  --  25* '19 14 18  '$ CREATININE 0.76 0.70 0.76 0.60 0.51 0.57  CALCIUM 9.1  --  8.2* 8.1* 7.9* 8.0*   Liver Function Tests: No results for input(s): "AST", "ALT", "ALKPHOS", "BILITOT", "PROT", "ALBUMIN" in the last 168 hours. No results for input(s): "LIPASE", "AMYLASE" in the last 168 hours. No results for input(s): "AMMONIA" in the last 168 hours. CBC: Recent Labs  Lab 05/19/22 1914 05/20/22 0125 05/21/22 0125 05/22/22 0103 05/23/22 0057 05/23/22 1547  WBC 14.2* 10.5 7.6 6.6 7.3  --   HGB 9.4* 8.7* 8.1* 7.4* 7.0* 8.7*  HCT 29.4* 27.4* 25.7* 23.5* 21.6* 27.2*  MCV 86.2 86.4 85.4 85.8 85.7  --   PLT 194 181 143* 134* 136*  --    Cardiac Enzymes: No results for input(s): "CKTOTAL", "CKMB", "CKMBINDEX", "TROPONINI" in the last 168 hours. BNP: Invalid input(s): "POCBNP" CBG: Recent Labs  Lab 05/18/22 2118 05/19/22 0606  GLUCAP 134* 116*   D-Dimer No results for input(s): "DDIMER" in the last 72 hours. Hgb A1c No results for input(s): "HGBA1C" in the last 72 hours. Lipid Profile No results for input(s): "CHOL", "HDL", "LDLCALC", "TRIG", "CHOLHDL", "LDLDIRECT" in the last 72 hours. Thyroid function studies No results for input(s): "TSH", "T4TOTAL", "T3FREE", "THYROIDAB" in the last 72 hours.  Invalid input(s): "FREET3" Anemia work up No results for input(s): "VITAMINB12", "FOLATE", "FERRITIN", "TIBC", "IRON", "RETICCTPCT" in the last 72 hours. Urinalysis    Component Value Date/Time   COLORURINE STRAW (A) 04/14/2018 1717   APPEARANCEUR CLEAR 04/14/2018 1717   LABSPEC 1.006 04/14/2018 1717   PHURINE 7.0 04/14/2018 1717   GLUCOSEU NEGATIVE 04/14/2018 1717   HGBUR SMALL (A) 04/14/2018 1717   BILIRUBINUR NEGATIVE 04/14/2018 1717   KETONESUR NEGATIVE 04/14/2018 1717   PROTEINUR NEGATIVE 04/14/2018 1717   UROBILINOGEN 0.2 04/19/2014 0610   NITRITE NEGATIVE 04/14/2018 1717   LEUKOCYTESUR SMALL (A) 04/14/2018  1717   Sepsis Labs Recent Labs  Lab 05/20/22 0125 05/21/22 0125 05/22/22 0103 05/23/22 0057  WBC 10.5 7.6 6.6 7.3   Microbiology Recent Results (from the past 240 hour(s))  Surgical pcr screen  Status: Abnormal   Collection Time: 05/18/22  6:05 PM   Specimen: Nasal Mucosa; Nasal Swab  Result Value Ref Range Status   MRSA, PCR POSITIVE (A) NEGATIVE Final    Comment: RESULT CALLED TO, READ BACK BY AND VERIFIED WITH: RN NDEYA ON 05/18/22 @ 2057 BY DRT    Staphylococcus aureus POSITIVE (A) NEGATIVE Final    Comment: (NOTE) The Xpert SA Assay (FDA approved for NASAL specimens in patients 16 years of age and older), is one component of a comprehensive surveillance program. It is not intended to diagnose infection nor to guide or monitor treatment. Performed at [facility], [place] [address], [place], [place] [address]   Time coordinating discharge: 35 minutes  SIGNED: Antonieta Pert, MD  [facility] 05/24/2022, 10:50 AM  If 7PM-7AM, please contact night-coverage [email or web address]

## 2022-05-24 NOTE — Progress Notes (Signed)
Physical Therapy Treatment Patient Details Name: Yvonne Ballard MRN: [id number] DOB: 1940-05-19 Today's Date: 05/24/2022   History of Present Illness Patient is a 82 y/o female who presents with left femoral neck fx s/p fall at home now s/p left hip hemiarthroplasty 3/7. 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    PT Comments    Pt progressing well with mobility and reports minimal pain. Focused on step through gait pattern during gait training with continual forward walker movement. Pt very motivated and eager to get to rehab for "professional help" to help transition home. Acute PT to cont to follow.     Recommendations for follow up therapy are one component of a multi-disciplinary discharge planning process, led by the attending physician.  Recommendations may be updated based on patient status, additional functional criteria and insurance authorization.  Follow Up Recommendations  Skilled nursing-short term rehab (<3 hours/day) Can patient physically be transported by private vehicle: Yes   Assistance Recommended at Discharge Intermittent Supervision/Assistance  Patient can return home with the following A little help with walking and/or transfers;A little help with bathing/dressing/bathroom   Equipment Recommendations  None recommended by PT    Recommendations for Other Services       Precautions / Restrictions Precautions Precautions: Fall Restrictions Weight Bearing Restrictions: Yes LLE Weight Bearing: Weight bearing as tolerated Other Position/Activity Restrictions: No hip precautions mentioned/ordered     Mobility  Bed Mobility               General bed mobility comments: pt received sitting EOB finishing breakfast upon arrival. Pt with L LE crossed over R LE    Transfers Overall transfer level: Needs assistance Equipment used: Rolling walker (2 wheels) Transfers: Sit to/from Stand Sit to Stand: Min guard            General transfer comment: verbal cues for safe hand placement, increased time, guarded/cautious    Ambulation/Gait Ambulation/Gait assistance: Min guard Gait Distance (Feet): 170 Feet Assistive device: Rolling walker (2 wheels) Gait Pattern/deviations: Step-to pattern, Decreased stance time - left, Decreased step length - right, Trunk flexed Gait velocity: decreased Gait velocity interpretation: <1.31 ft/sec, indicative of household ambulator   General Gait Details: worked on step through gait pattern, second half of ambulation pt able to complete step through gait pattern at decreased pace with report "I just feels weird, it doesn't hurt just feels weird." Explained that her L LE is slightly longer than the R leg now and pt said "Dr. Luanna Cole helper did tell me that. That makes sense."   Stairs             Wheelchair Mobility    Modified Rankin (Stroke Patients Only)       Balance Overall balance assessment: History of Falls, Needs assistance Sitting-balance support: Feet supported, No upper extremity supported Sitting balance-Leahy Scale: Good     Standing balance support: During functional activity, Bilateral upper extremity supported Standing balance-Leahy Scale: Poor Standing balance comment: UE supported with RW                            Cognition Arousal/Alertness: Awake/alert Behavior During Therapy: WFL for tasks assessed/performed Overall Cognitive Status: Within Functional Limits for tasks assessed Area of Impairment: Safety/judgement, Memory, Following commands, Attention                   Current Attention Level: Selective, Alternating Memory: Decreased short-term  memory Following Commands: Follows one step commands consistently Safety/Judgement: Decreased awareness of deficits, Decreased awareness of safety     General Comments: pt more aware of deficits and desires to go to [facility] for the "professional help". Pt  receptive and appreciative of PT education of L LE now slightly longer than the R.        Exercises Total Joint Exercises Hip ABduction/ADduction: AAROM, 10 reps, Supine, Both (in recliner, pillow squeezes) Long Arc Quad: AROM, Left, 10 reps, Seated General Exercises - Lower Extremity Hip ABduction/ADduction:  (x2) Hip Flexion/Marching: AROM, Left, 20 reps, Seated    General Comments General comments (skin integrity, edema, etc.): Mild SOB with amb, SPO2 >94% on RA, HR up to 120s during amb      Pertinent Vitals/Pain Pain Assessment Pain Assessment: 0-10 Pain Score: 1  Pain Location: left hip Pain Descriptors / Indicators: Discomfort Pain Intervention(s): Monitored during session    Home Living                          Prior Function            PT Goals (current goals can now be found in the care plan section) Acute Rehab PT Goals Patient Stated Goal: go to rehab PT Goal Formulation: With patient Time For Goal Achievement: 06/03/22 Potential to Achieve Goals: Good Progress towards PT goals: Progressing toward goals    Frequency    Min 3X/week      PT Plan Current plan remains appropriate;Frequency needs to be updated    Co-evaluation              AM-PAC PT "6 Clicks" Mobility   Outcome Measure  Help needed turning from your back to your side while in a flat bed without using bedrails?: A Little Help needed moving from lying on your back to sitting on the side of a flat bed without using bedrails?: A Little Help needed moving to and from a bed to a chair (including a wheelchair)?: A Little Help needed standing up from a chair using your arms (e.g., wheelchair or bedside chair)?: A Little Help needed to walk in hospital room?: A Little Help needed climbing 3-5 steps with a railing? : A Lot 6 Click Score: 17    End of Session Equipment Utilized During Treatment: Gait belt Activity Tolerance: Patient tolerated treatment well Patient left:  in chair;with call bell/phone within reach;with chair alarm set Nurse Communication: Mobility status PT Visit Diagnosis: Difficulty in walking, not elsewhere classified (R26.2) Pain - Right/Left: Left Pain - part of body: Hip     Time: [id number] PT Time Calculation (min) (ACUTE ONLY): 28 min  Charges:  $Gait Training: 8-22 mins $Therapeutic Exercise: 8-22 mins                     Kittie Plater, PT, DPT Acute Rehabilitation Services Secure chat preferred Office #: [phone]    Berline Lopes 05/24/2022, 10:07 AM

## 2022-05-24 NOTE — Progress Notes (Signed)
     Subjective: 5 Days Post-Op s/p Procedure(s): LEFT PARTIAL HIP ARTHROPLASTY  Overall doing well. No pain and slept well last night. Happy with progress with PT so far.  No other complaints.      Objective:  PE: VITALS:   Vitals:   05/23/22 1950 05/24/22 0031 05/24/22 0033 05/24/22 0445  BP:    (!) 156/74  Pulse: 63  81 75  Resp:   20   Temp: 98.4 F (36.9 C) 98.9 F (37.2 C) 98.9 F (37.2 C) 98.9 F (37.2 C)  TempSrc: Oral Oral Oral Oral  SpO2: 98%     Weight:      Height:        ABD soft Sensation intact distally Intact pulses distally Dorsiflexion/Plantar flexion intact Incision: scant drainage dressing with only a small amount of strike through noted.  Significant ecchymosis seen at left hip. Not tender to touch  LABS  Results for orders placed or performed during the hospital encounter of 05/17/22 (from the past 24 hour(s))  Prepare RBC (crossmatch)     Status: None   Collection Time: 05/23/22 10:30 AM  Result Value Ref Range   Order Confirmation      ORDER PROCESSED BY BLOOD BANK Performed at [facility], [address] [place], [place] [address]   Hemoglobin and hematocrit, blood     Status: Abnormal   Collection Time: 05/23/22  3:47 PM  Result Value Ref Range   Hemoglobin 8.7 (L) 12.0 - 15.0 g/dL   HCT 27.2 (L) 36.0 - 46.0 %    No results found.  Assessment/Plan: Principal Problem:   Closed displaced fracture of left femoral neck (HCC) Active Problems:   Benign essential HTN   CAD (coronary artery disease), native coronary artery   Dyslipidemia, goal LDL below 70   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HCC)   History of CVA (cerebrovascular accident)- 03/14/2022   DNR (do not resuscitate)/DNI(Do Not Intubate)   Hypokalemia   5 Days Post-Op s/p Procedure(s): LEFT PARTIAL HIP ARTHROPLASTY  Hbg up to 8.7 this morning after transfusion.  Weightbearing: WBAT LLE Insicional and dressing care: Reinforce dressings as needed New dressing  placed on 3/9. Will change again this afternoon. VTE prophylaxis: ASA and plavix Pain control: continue current regimen Follow - up plan: 2 weeks with Dr. Mardelle Matte Dispo: PT recommending SNF, awaiting placement.   Contact information:   After hours and holidays please check [email or web address] for group call information for [facility]  [name] 05/24/2022, 7:38 AM

## 2022-05-24 NOTE — TOC Transition Note (Signed)
Transition of Care [facility]) - CM/SW Discharge Note   Patient Details  Name: Yvonne Ballard MRN: [id number] Date of Birth: 06/29/1940  [facility]) CM/SW Contact:  Bethann Berkshire, [place] Phone Number: 05/24/2022, 11:25 AM   Clinical Narrative:     CSW spoke with [facility] liaison who confirmed that pt's supplemental [employer] plan will cover copays at 100% once pt enters copay days. No insurance auth needed. CSW updated pt who is agreeable to DC to [facility] today.  CSW updated daughter Yvonne Ballard over the phone who is also agreeable.   Patient will DC to: [facility] Anticipated DC date: 05/24/22 Family notified: Daughter Yvonne Ballard Transport by: Yvonne Ballard   Per MD patient ready for DC to [facility]. RN, patient, patient's family, and facility notified of DC. Discharge Summary and FL2 sent to facility. RN to call report prior to discharge ([phone] room 603b). DC packet on chart. Ambulance transport requested for patient.   CSW will sign off for now as social work intervention is no longer needed. Please consult [place] again if new needs arise.   Final next level of care: Skilled Nursing Facility Barriers to Discharge: No Barriers Identified   Patient Goals and CMS Choice CMS Medicare.gov Compare Post Acute Care list provided to:: Patient Choice offered to / list presented to : Patient, Adult Children  Discharge Placement                Patient chooses bed at: [facility] Patient to be transferred to facility by: [place] Name of family member notified: daughter Yvonne Ballard Patient and family notified of of transfer: 05/24/22  Discharge Plan and Services Additional resources added to the After Visit Summary for   In-house Referral: Clinical Social Work Discharge Planning Services: CM Consult Post Acute Care Choice: Home Health, [place]                               Social Determinants of Health (SDOH) Interventions SDOH Screenings   Food  Insecurity: No Food Insecurity (05/17/2022)  Housing: Low Risk  (05/17/2022)  Transportation Needs: No Transportation Needs (05/17/2022)  Utilities: Not At Risk (05/17/2022)  Tobacco Use: Low Risk  (05/20/2022)     Readmission Risk Interventions     No data to display

## 2022-05-26 DIAGNOSIS — S72002A Fracture of unspecified part of neck of left femur, initial encounter for closed fracture: Secondary | ICD-10-CM | POA: Diagnosis not present

## 2022-05-26 DIAGNOSIS — I10 Essential (primary) hypertension: Secondary | ICD-10-CM | POA: Diagnosis not present

## 2022-05-26 DIAGNOSIS — E785 Hyperlipidemia, unspecified: Secondary | ICD-10-CM | POA: Diagnosis not present

## 2022-06-03 ENCOUNTER — Ambulatory Visit: Payer: 59 | Admitting: Nurse Practitioner

## 2022-07-01 ENCOUNTER — Encounter: Payer: Self-pay | Admitting: Nurse Practitioner

## 2022-07-01 ENCOUNTER — Ambulatory Visit: Payer: 59 | Attending: Nurse Practitioner | Admitting: Nurse Practitioner

## 2022-07-01 VITALS — BP 122/66 | HR 62 | Ht 60.0 in | Wt 93.0 lb

## 2022-07-01 DIAGNOSIS — I10 Essential (primary) hypertension: Secondary | ICD-10-CM

## 2022-07-01 DIAGNOSIS — I350 Nonrheumatic aortic (valve) stenosis: Secondary | ICD-10-CM

## 2022-07-01 DIAGNOSIS — I5189 Other ill-defined heart diseases: Secondary | ICD-10-CM

## 2022-07-01 DIAGNOSIS — Z8673 Personal history of transient ischemic attack (TIA), and cerebral infarction without residual deficits: Secondary | ICD-10-CM

## 2022-07-01 DIAGNOSIS — I7121 Aneurysm of the ascending aorta, without rupture: Secondary | ICD-10-CM

## 2022-07-01 DIAGNOSIS — Z952 Presence of prosthetic heart valve: Secondary | ICD-10-CM

## 2022-07-01 DIAGNOSIS — I4719 Other supraventricular tachycardia: Secondary | ICD-10-CM | POA: Diagnosis not present

## 2022-07-01 DIAGNOSIS — I491 Atrial premature depolarization: Secondary | ICD-10-CM | POA: Diagnosis not present

## 2022-07-01 DIAGNOSIS — E785 Hyperlipidemia, unspecified: Secondary | ICD-10-CM

## 2022-07-01 DIAGNOSIS — I2510 Atherosclerotic heart disease of native coronary artery without angina pectoris: Secondary | ICD-10-CM

## 2022-07-01 NOTE — Progress Notes (Unsigned)
Office Visit    Patient Name: Yvonne Ballard Date of Encounter: 07/01/2022  Primary Care Provider:  Crist Fat, MD Primary Cardiologist:  Peter Swaziland, MD  Chief Complaint    82 year old female with a history of diastolic dysfunction without heart failure, aortic stenosis, ascending aortic aneurysm, s/p Bentall procedure, mild nonobstructive CAD, paroxysmal atrial tachycardia, frequent PACs, hypertension, hyperlipidemia, CVA, and PMR who presents for hospital follow-up related to tachycardia.  Past Medical History    Past Medical History:  Diagnosis Date   Abnormal ECG    Anxiety    pt. 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   Weight loss    Past Surgical History:  Procedure Laterality Date   BENTALL PROCEDURE N/A 03/31/2014   Procedure: BIO-BENTALL PROCEDURE using a 23mm Magna Ease Aortic Tissue Valve, a 26 mm Terumo Gelweave Valsalva Graft, a 14x10x16mm Terumo Gelweave Graft, and a 28mm straight Hemashield Platinum Graft.;  Surgeon: Kerin Perna, MD;  Location: [facility] OR;  Service: Open Heart Surgery;  Laterality: N/A;   HIP ARTHROPLASTY Left 05/19/2022   Procedure: LEFT PARTIAL HIP ARTHROPLASTY;  Surgeon: Teryl Lucy, MD;  Location: [facility] OR;  Service: Orthopedics;  Laterality: Left;   LEFT AND RIGHT HEART CATHETERIZATION WITH CORONARY ANGIOGRAM N/A 03/26/2014   Procedure: LEFT AND RIGHT HEART CATHETERIZATION WITH CORONARY ANGIOGRAM;  Surgeon: Peter M Swaziland, MD;  Location: [facility] CATH LAB;  Service: Cardiovascular;   Laterality: N/A;   TEE WITHOUT CARDIOVERSION N/A 03/31/2014   Procedure: TRANSESOPHAGEAL ECHOCARDIOGRAM (TEE);  Surgeon: Kerin Perna, MD;  Location: [facility] OR;  Service: Open Heart Surgery;  Laterality: N/A;   TONSILLECTOMY     TUBAL LIGATION      Allergies  No Known Allergies   Labs/Other Studies Reviewed    The following studies were reviewed today: -- 2D ECHO (05/08/14) EF 55-60%, no RWMA, G2DD, s/p biological Bentall procedure w/ total large replacement of a large arch aneurysm. Mean AV grad WNL for bioprosthesis. No AR. Mod MR thickening. L pleural effusion.    Echo 02/08/17: Study Conclusions   - Left ventricle: The cavity size was normal. Wall thickness was   increased in a pattern of mild LVH. Systolic function was normal.   The estimated ejection fraction was in the range of 60% to 65%.   Left ventricular diastolic function parameters were normal. - Aortic valve: Aoritc sinuses appear somewhat dilated above AVR   Post Bental AV leaflets not well seen mild gradient Root not well   visualized no AR Valve area (VTI): 1.49 cm^2. Valve area (Vmax):   1.37 cm^2. Valve area (Vmean): 1.3 cm^2. - Mitral valve: Calcified annulus. Mildly thickened leaflets . - Atrial septum: No defect or patent foramen ovale was identified   ADDENDUM: Dr. Roda Shutters called to discuss the patient's thoracic aortic aneurysm. The luminal flap at the isthmus is contiguous with anastomosis between the prosthetic arch and the native aorta and the flap is attributed to  graft material (elephant trunk). In this system the aorta has not been imaged since before her repair and the patient is presumably under the care of a cardiovascular surgeon. The aneurysmal descending aorta has increased size since a 2016 comparison, 4.2 cm today compared to 3.5 cm ~6 years ago.     Electronically Signed   By: Marnee Spring M.D.   On: 11/17/2019 09:09    Addended by Audry Riles, MD on 11/17/2019  9:11 AM    Study  Result   Narrative & Impression  CLINICAL DATA:  Stroke/TIA.   EXAM: CT ANGIOGRAPHY NECK   TECHNIQUE: Multidetector CT imaging of the neck was performed using the standard protocol during bolus administration of intravenous contrast. Multiplanar CT image reconstructions and MIPs were obtained to evaluate the vascular anatomy. Carotid stenosis measurements (when applicable) are obtained utilizing NASCET criteria, using the distal internal carotid diameter as the denominator.   CONTRAST:  50mL OMNIPAQUE IOHEXOL 300 MG/ML  SOLN   COMPARISON:  None similar   FINDINGS: Arteries: Aneurysmal descending aorta, measuring up to 4.2 cm on axial slices. There has been an ascending and arch repair with great vessel reimplantation, ostia not covered. Where covered, the great vessels are widely patent. There is ICA tortuosity and right more than left kinking without atheromatous stenosis. The left vertebral artery arises from the left subclavian artery which appears native and less intensely enhancing than the right. No superimposed stenosis is noted. The left vertebral artery is progressively dense towards the basilar and possibly flowing retrograde.   Skeleton: No acute or aggressive finding. Diffuse disc narrowing and endplate degeneration with degenerative sclerosis anteriorly at T1-2 there is disc collapse.   Other neck: No evidence of inflammation or mass.   Upper chest: Aneurysmal aorta is noted above.   IMPRESSION: 1. Aneurysmal thoracic aorta with ascending and arch repair and great vessel reimplantation (which is not visualized). The left subclavian has a native position with graft covering the ostium and asymmetrically faint enhancement. There could be left vertebral retrograde flow. 2. No significant atherosclerosis in the neck.   Electronically Signed: By: Marnee Spring M.D. On: 11/16/2019 13:16    Echo 11/16/19: IMPRESSIONS     1. Left ventricular ejection  fraction, by estimation, is 60 to 65%. The  left ventricle has normal function. The left ventricle has no regional  wall motion abnormalities. There is moderate left ventricular hypertrophy.  Left ventricular diastolic  parameters are consistent with Grade II diastolic dysfunction  (pseudonormalization). Elevated left atrial pressure.   2. Right ventricular systolic function is normal. The right ventricular  size is normal. There is normal pulmonary artery systolic pressure. The  estimated right ventricular systolic pressure is 24.5 mmHg.   3. Left atrial size was moderately dilated.   4. Right atrial size was mildly dilated.   5. The mitral valve was not well visualized. Trivial mitral valve  regurgitation.   6. Tricuspid valve regurgitation is mild to moderate.   7. The aortic valve has been repaired/replaced. Aortic valve  regurgitation is not visualized. Mild aortic valve stenosis. Vmax 2.6 m/s,  MG 12 mmHg, AVA 1.4 cm^2, DI 0.34   8. The inferior vena cava is normal in size with greater than 50%  respiratory variability, suggesting right atrial pressure of 3 mmHg.   9. 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 S/p Bentall procedure, prosthetic aortic valve not well visualized.  Given presentation with CVA, would recommend TEE for further evaluation  Echo 03/2022: IMPRESSIONS     1. Left ventricular ejection fraction, by estimation, is 55 to 60%. The  left ventricle has normal function. The left ventricle demonstrates  regional wall motion abnormalities (see scoring diagram/findings for  description). There is mild concentric left  ventricular hypertrophy. Left ventricular diastolic parameters are  consistent with Grade II diastolic dysfunction (pseudonormalization).  Elevated left atrial pressure.   2. Right ventricular systolic function is mildly reduced. The right  ventricular size is normal. There is normal pulmonary artery systolic   pressure. The estimated right ventricular systolic pressure is 24.7 mmHg.   3. Left atrial size was mildly dilated.   4. The mitral valve is myxomatous. Mild mitral valve regurgitation.   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   6. The aortic valve has been repaired/replaced. Aortic valve  regurgitation is not visualized. No aortic stenosis is present. There is a  valve present in the aortic position. Echo findings are consistent with  normal structure and function of the aortic  valve prosthesis. Aortic valve mean gradient measures 11.0 mmHg. Aortic  valve Vmax measures 2.31 m/s. Aortic valve acceleration time measures 49  msec.   7. Aortic root/ascending aorta has been repaired/replaced and dilatation  noted. 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   Comparison(s): No significant change from prior study. Prior images  reviewed side by side.    Recent Labs: 03/14/2022: ALT 17 03/17/2022: Magnesium 1.9 05/23/2022: BUN 18; Creatinine, Ser 0.57; Hemoglobin 8.7; Platelets 136; Potassium 3.8; Sodium 137  Recent Lipid Panel    Component Value Date/Time   CHOL 216 (H) 03/14/2022 1023   CHOL 215 (H) 11/04/2019 1545   TRIG 61 03/14/2022 1023   HDL 77 03/14/2022 1023   HDL 72 11/04/2019 1545   CHOLHDL 2.8 03/14/2022 1023   VLDL 12 03/14/2022 1023   LDLCALC 127 (H) 03/14/2022 1023   LDLCALC 123 (H) 11/04/2019 1545   LDLDIRECT 124.5 (H) 11/16/2019 0545    History of Present Illness    82 year old female with the above past medical history including diastolic dysfunction without heart failure, aortic stenosis ascending aortic aneurysm, s/p Bentall, mild nonobstructive CAD, paroxysmal atrial tachycardia, frequent PACs, hypertension, hyperlipidemia, CVA, and PMR.  She was initially evaluated cardiology in 2016.  Echocardiogram at the time showed large thoracic aneurysm.  Cardiac catheterization showed mild  nonobstructive CAD, mild LV dysfunction, EF 45%, thoracic aneurysm, mildly elevated PA pressures.  She underwent Bentall procedure, ascending and arch TAA repair with bio Bentall procedure with tissue AVR and aorto brachiocephalic bypass and aorto left common carotid bypass on 03/31/2014.  She was hospitalized in November 2018 in the setting of CVA.  She was seen in the ED again in February 2020 with dysarthria.  MRI and CT of the head which showed no acute changes, chronic microvascular disease.  She was hospitalized in September 2020 with central retinal artery occlusion.  MRI showed small vessel disease without CVA.  Echocardiogram at the time was stable.  She was hospitalized in January 2024 in the setting of acute CVA.  Echocardiogram during her hospitalization showed EF 50 to 60%, left ventricular regional wall motion abnormalities, mild left ventricular hypertrophy, normally functioning bioprosthetic valve.  She was last seen in the office on 05/04/2022 and was stable from a cardiac standpoint.  Her BP was elevated.  Irbesartan was increased to 75 mg twice daily.  She was hospitalized in 05/2022 in the  setting of mechanical fall.  Hip x-ray showed acute transverse fracture of the proximal left femoral neck with mild to moderate superolateral displacement of the distal fracture component and valgus angulation.  She saw orthopedic surgery and underwent left hip hemiarthroplasty on 05/19/2022.  She did have postop anemia.  Cardiology was consulted prior to surgery in the setting of irregular heart rhythm on telemetry.  Telemetry review revealed sinus with PAT.  She was asymptomatic and she was deemed acceptable risk for surgery.  Ties and was increased to 180 mg twice daily.  Outpatient follow-up with cardiology was recommended.  She was discharged home in stable condition on 05/24/2022.  She presents today for follow-up.  Since her hospitalization she has done well from a cardiac standpoint.  She denies any  palpitations, dyspnea, chest pain, dizziness, presyncope, syncope, edema, PND, orthopnea, weight gain.  She has noticed some weakness in her left leg.  She has had weakness on this side since her stroke.  She is concerned that she may be developing a foot drop.  She saw her orthopedic surgeon who did x-rays and saw no complications related to her hip surgery.  I advised her to continue to follow-up with physical therapy as she has not been participating in PT.  She is in from independent living.  Will defer any further testing, medications at this time.  Given no symptoms.  Follow-up as scheduled with Dr. Swaziland in 11/2022.  Nonadherence.  She is no longer taking her Plavix.  She has declined statin therapy.  Will unlikely repeat fasting lipids, LFTs.  Paroxysmal atrial tachycardia, frequent PACs: Sinus with paroxysmal atrial tachycardia.  Patient was asymptomatic.  If worsening tachycardia, consider metoprolol 25 mg bid.  Diastolic dysfunction: Echo in 03/6107 showed EF 50 to 60%, left ventricular regional wall motion abnormalities, mild left ventricular hypertrophy, normally functioning bioprosthetic valve.  Aortic stenosis, ascending aortic aneurysm: S/p Bentall procedure.  Most recent echo as above.  Mild nonobstructive CAD: Cath in 2016 revealed mild nonobstructive CAD. Hypertension: BP well controlled. Continue current antihypertensive regimen.  Hyperlipidemia: LDL was 127 in 03/2022. Due for repeat fasting lipids, LFTs.  CVA:  Follows with neurology.  Disposition: Follow-up in   Home Medications    Current Outpatient Medications  Medication Sig Dispense Refill   diltiazem (CARDIZEM) 60 MG tablet Take 30 mg by mouth daily as needed (SBP > 150).     irbesartan (AVAPRO) 150 MG tablet TAKE 1/2 TABLET TWICE A DAY. (Patient taking differently: Take 75 mg by mouth 2 (two) times daily.) 90 tablet 3   Multiple Vitamin (MULTIVITAMIN WITH MINERALS) TABS tablet Take 1 tablet by mouth daily.     No current  facility-administered medications for this visit.     Review of Systems    He denies chest pain, palpitations, dyspnea, pnd, orthopnea, n, v, dizziness, syncope, edema, weight gain, or early satiety. All other systems reviewed and are otherwise negative except as noted above.   Physical Exam    VS:  BP 122/66   Pulse 62   Ht 5' (1.524 m)   Wt 93 lb (42.2 kg)   SpO2 98%   BMI 18.16 kg/m  GEN: Well nourished, well developed, in no acute distress. HEENT: normal. Neck: Supple, no JVD, carotid bruits, or masses. Cardiac: RRR, 2/6 murmur, no rubs, or gallops. No clubbing, cyanosis, edema.  Radials/DP/PT 2+ and equal bilaterally.  Respiratory:  Respirations regular and unlabored, clear to auscultation bilaterally. GI: Soft, nontender, nondistended, BS + x 4. MS:  no deformity or atrophy. Skin: warm and dry, no rash. Neuro:  Strength and sensation are intact. Psych: Normal affect.  Accessory Clinical Findings    ECG personally reviewed by me today -NSR, 62 bpm, RBBB- no acute changes.   Lab Results  Component Value Date   WBC 7.3 05/23/2022   HGB 8.7 (L) 05/23/2022   HCT 27.2 (L) 05/23/2022   MCV 85.7 05/23/2022   PLT 136 (L) 05/23/2022   Lab Results  Component Value Date   CREATININE 0.57 05/23/2022   BUN 18 05/23/2022   NA 137 05/23/2022   K 3.8 05/23/2022   CL 106 05/23/2022   CO2 21 (L) 05/23/2022   Lab Results  Component Value Date   ALT 17 03/14/2022   AST 26 03/14/2022   ALKPHOS 63 03/14/2022   BILITOT 0.6 03/14/2022   Lab Results  Component Value Date   CHOL 216 (H) 03/14/2022   HDL 77 03/14/2022   LDLCALC 127 (H) 03/14/2022   LDLDIRECT 124.5 (H) 11/16/2019   TRIG 61 03/14/2022   CHOLHDL 2.8 03/14/2022    Lab Results  Component Value Date   HGBA1C 5.7 (H) 03/14/2022    Assessment & Plan    1.  ***      Joylene Grapes, NP 07/01/2022, 1:53 PM

## 2022-07-01 NOTE — Patient Instructions (Signed)
Medication Instructions:  Your physician recommends that you continue on your current medications as directed. Please refer to the Current Medication list given to you today.  *If you need a refill on your cardiac medications before your next appointment, please call your pharmacy*   Lab Work: NONE ordered at this time of appointment   If you have labs (blood work) drawn today and your tests are completely normal, you will receive your results only by: MyChart Message (if you have MyChart) OR A paper copy in the mail If you have any lab test that is abnormal or we need to change your treatment, we will call you to review the results.   Testing/Procedures: NONE ordered at this time of appointment     Follow-Up: At [facility], you and your health needs are our priority.  As part of our continuing mission to provide you with exceptional heart care, we have created designated Provider Care Teams.  These Care Teams include your primary Cardiologist (physician) and Advanced Practice Providers (APPs -  Physician Assistants and Nurse Practitioners) who all work together to provide you with the care you need, when you need it.  We recommend signing up for the patient portal called "MyChart".  Sign up information is provided on this After Visit Summary.  MyChart is used to connect with patients for Virtual Visits (Telemedicine).  Patients are able to view lab/test results, encounter notes, upcoming appointments, etc.  Non-urgent messages can be sent to your provider as well.   To learn more about what you can do with MyChart, go to [email or web address].    Your next appointment:    Keep follow up   Provider:   Peter Jordan, MD     Other Instructions   

## 2022-07-03 ENCOUNTER — Encounter: Payer: Self-pay | Admitting: Nurse Practitioner

## 2022-11-22 NOTE — Progress Notes (Signed)
Cardiology Office Note   Date:  11/25/2022   ID:  Yvonne Ballard, DOB 1940/08/31, MRN [id number]  PCP:  Crist Fat, MD  Cardiologist:  Dr. [place]   History of Present Illness: Yvonne Ballard is a 82 y.o. female with a hx of HTN, HLD, thoracic aortic aneurysm s/p Bental procedure (03/2014), and nonobst CAD who presents for follow up   She was initially seen in 03/2014. A 2D echo was done which showed a large thoracic aneurysm and the patient underwent heart catheterization showing mild nonobstructive CAD with mild LV dysfunction EF 45% with thoracic aneurysm and mildly elevated PA pressures.  She underwent ascending and arch TAA repair with Bio-Bental procedure with tissue AVR and aorto-brachiocephalic bypass and aorto-left common carotid bypass on 03/31/2014.   She was admitted in November 2018 with left facial droop and dysarthria. CT acutely was negative. MRI significant for acute infarction involving the right posterior lentiform nucleus, mid corona radiata, and caudate body. She had evidence of moderate chronic microvascular changes. She was treated with lipitor and ASA.   She was seen in February 2020 in the ED with some dysarthria. MRI and CT of the head showed no acute changes with chronic microvascular disease.   In September she was admitted with a right central retinal artery occlusion. MRI showed small vessel disease without CVA. CT chest done with findings noted below. Echo was stable. There was consideration of TEE as outpatient but patient never followed up with cardiology.   She was hospitalized in January 2024 in the setting of acute CVA.  Echocardiogram during her hospitalization showed EF 50 to 60%, left ventricular regional wall motion abnormalities, mild left ventricular hypertrophy, normally functioning bioprosthetic valve.  She was last seen in the office on 05/04/2022 and was stable from a cardiac standpoint.  Her BP was elevated.  Irbesartan was increased to 75 mg  twice daily.  She was hospitalized in 05/2022 in the setting of mechanical fall.  Hip x-ray showed acute transverse fracture of the proximal left femoral neck with mild to moderate superolateral displacement of the distal fracture component and valgus angulation.  She saw orthopedic surgery and underwent left hip hemiarthroplasty on 05/19/2022.  She did have postop anemia.  Cardiology was consulted prior to surgery in the setting of irregular heart rhythm on telemetry.  Telemetry review revealed sinus with PAT.  She was asymptomatic and she was deemed acceptable risk for surgery. She did have follow up in our office in April and denies cardiac complaints. Noted she had stopped taking Plavix and refused statin therapy.   On follow up today she is doing well. 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 Now walks with a walker. No chest pain or dyspnea. States she doesn't take ASA regularly. States she thinks she is taking cardizem bid but this is not listed on her meds.   Past Medical History:  Diagnosis Date   Abnormal ECG    Anxiety    pt. 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   Stroke (HCC) 03/14/2022   Weight loss     Past Surgical History:  Procedure Laterality Date   BENTALL PROCEDURE N/A 03/31/2014   Procedure: BIO-BENTALL PROCEDURE using a 23mm Magna Ease Aortic Tissue Valve, a 26 mm Terumo Gelweave Valsalva Graft, a 14x10x12mm Terumo Gelweave Graft, and a 28mm straight Hemashield Platinum Graft.;  Surgeon: Kerin Perna, MD;   Location: [facility] OR;  Service: Open Heart Surgery;  Laterality: N/A;   HIP ARTHROPLASTY Left 05/19/2022   Procedure: LEFT PARTIAL HIP ARTHROPLASTY;  Surgeon: Teryl Lucy, MD;  Location: [facility] OR;  Service: Orthopedics;  Laterality: Left;   LEFT AND RIGHT HEART CATHETERIZATION WITH CORONARY ANGIOGRAM N/A 03/26/2014   Procedure: LEFT AND RIGHT HEART CATHETERIZATION WITH CORONARY ANGIOGRAM;  Surgeon: Lismary Kiehn M [place], MD;  Location: [facility] CATH LAB;  Service: Cardiovascular;  Laterality: N/A;   TEE WITHOUT CARDIOVERSION N/A 03/31/2014   Procedure: TRANSESOPHAGEAL ECHOCARDIOGRAM (TEE);  Surgeon: Kerin Perna, MD;  Location: [facility] OR;  Service: Open Heart Surgery;  Laterality: N/A;   TONSILLECTOMY     TUBAL LIGATION       Current Outpatient Medications  Medication Sig Dispense Refill   aspirin EC 81 MG tablet Take 1 tablet (81 mg total) by mouth daily. Swallow whole.     diltiazem (CARDIZEM) 60 MG tablet Take 30 mg by mouth daily as needed (SBP > 150).     irbesartan (AVAPRO) 150 MG tablet TAKE 1/2 TABLET TWICE A DAY. (Patient taking differently: Take 75 mg by mouth 2 (two) times daily.) 90 tablet 3   Multiple Vitamin (MULTIVITAMIN WITH MINERALS) TABS tablet Take 1 tablet by mouth daily.     No current facility-administered medications for this visit.    Allergies:   Patient has no known allergies.    Social History:  The patient  reports that she has never smoked. She has never used smokeless tobacco. She reports current alcohol use. She reports that she does not use drugs.   Family History:  The patient's family history includes Cancer in her brother, father, and mother; Stroke in her father.    ROS:  Please see the history of present illness.   Otherwise, review of systems are positive for none.   All other systems are reviewed and negative.    PHYSICAL EXAM: VS:  BP 124/84   Pulse 73   Ht 5' (1.524 m)   Wt 92 lb (41.7 kg)   SpO2 97%   BMI 17.97 kg/m  , BMI Body mass index is 17.97 kg/m.   GENERAL:  Well appearing, elderly WF in NAD HEENT:  PERRL, EOMI, sclera are clear. Oropharynx is clear. NECK:  No jugular venous distention, carotid upstroke brisk and symmetric, no bruits, no thyromegaly or adenopathy LUNGS:  Clear to auscultation bilaterally CHEST:  Unremarkable HEART:  IRRR,  PMI not displaced or sustained,S1 and S2 within normal limits, no S3, no S4: no clicks, no rubs, gr 1/6 systolic murmur RUSB>LSB ABD:  Soft, nontender. BS +, no masses or bruits. No hepatomegaly, no splenomegaly EXT:  2 + pulses throughout, no edema, no cyanosis no clubbing SKIN:  Warm and dry.  No rashes NEURO:  Alert and oriented x 3. Cranial nerves II through XII intact. PSYCH:  Cognitively intact     Recent Labs: 03/14/2022: ALT 17 03/17/2022: Magnesium 1.9 05/23/2022: BUN 18; Creatinine, Ser 0.57; Hemoglobin 8.7; Platelets 136; Potassium 3.8; Sodium 137  Lipid Panel    Component Value Date/Time   CHOL 216 (H) 03/14/2022 1023   CHOL 215 (H) 11/04/2019 1545   TRIG 61 03/14/2022 1023   HDL 77 03/14/2022 1023   HDL 72 11/04/2019 1545   CHOLHDL 2.8 03/14/2022 1023   VLDL 12 03/14/2022 1023   LDLCALC 127 (H) 03/14/2022 1023   LDLCALC 123 (H) 11/04/2019 1545   LDLDIRECT 124.5 (H) 11/16/2019 0545   Dated 12/13/17: cholesterol 204, triglycerides 84, HDL 62, LDL 125.  Dated 03/27/20: cholesterol 190, triglycerides 74, HDL 68, LDL 108. CMET normal. Dated 05/25/21: cholesterol 215, triglycerides 93, HDL 75, LDL 123. CMET normal Dated 07/20/22: cholesterol 192, triglycerides 86, HDL 71, LDL 106. CMET normal.  Wt Readings from Last 3 Encounters:  11/25/22 92 lb (41.7 kg)  07/01/22 93 lb (42.2 kg)  05/19/22 97 lb (44 kg)      Other studies Reviewed:  -- 2D ECHO (05/08/14) EF 55-60%, no RWMA, G2DD, s/p biological Bentall procedure w/ total large replacement of a large arch aneurysm. Mean AV grad WNL for bioprosthesis. No AR. Mod MR thickening. L pleural effusion.    Echo 02/08/17: Study  Conclusions   - Left ventricle: The cavity size was normal. Wall thickness was   increased in a pattern of mild LVH. Systolic function was normal.   The estimated ejection fraction was in the range of 60% to 65%.   Left ventricular diastolic function parameters were normal. - Aortic valve: Aoritc sinuses appear somewhat dilated above AVR   Post Bental AV leaflets not well seen mild gradient Root not well   visualized no AR Valve area (VTI): 1.49 cm^2. Valve area (Vmax):   1.37 cm^2. Valve area (Vmean): 1.3 cm^2. - Mitral valve: Calcified annulus. Mildly thickened leaflets . - Atrial septum: No defect or patent foramen ovale was identified  ADDENDUM: Dr. Roda Shutters called to discuss the patient's thoracic aortic aneurysm. The luminal flap at the isthmus is contiguous with anastomosis between the prosthetic arch and the native aorta and the flap is attributed to graft material (elephant trunk). In this system the aorta has not been imaged since before her repair and the patient is presumably under the care of a cardiovascular surgeon. The aneurysmal descending aorta has increased size since a 2016 comparison, 4.2 cm today compared to 3.5 cm ~6 years ago.     Electronically Signed   By: Marnee Spring M.D.   On: 11/17/2019 09:09    Addended by Audry Riles, MD on 11/17/2019  9:11 AM   Study Result  Narrative & Impression  CLINICAL DATA:  Stroke/TIA.   EXAM: CT ANGIOGRAPHY NECK   TECHNIQUE: Multidetector CT imaging of the neck was performed using the standard protocol during bolus administration of intravenous contrast. Multiplanar CT image reconstructions and MIPs were obtained to evaluate the vascular anatomy. Carotid stenosis measurements (when applicable) are obtained utilizing NASCET criteria, using the distal internal carotid diameter as the denominator.   CONTRAST:  50mL OMNIPAQUE IOHEXOL 300 MG/ML  SOLN   COMPARISON:  None similar   FINDINGS: Arteries: Aneurysmal  descending aorta, measuring up to 4.2 cm on axial slices. There has been an ascending and arch repair with great vessel reimplantation, ostia not covered. Where covered, the great vessels are widely patent. There is ICA tortuosity and right more than left kinking without atheromatous stenosis. The left vertebral artery arises from the left subclavian artery which appears native and less intensely enhancing than the right. No superimposed stenosis is noted. The  left vertebral artery is progressively dense towards the basilar and possibly flowing retrograde.   Skeleton: No acute or aggressive finding. Diffuse disc narrowing and endplate degeneration with degenerative sclerosis anteriorly at T1-2 there is disc collapse.   Other neck: No evidence of inflammation or mass.   Upper chest: Aneurysmal aorta is noted above.   IMPRESSION: 1. Aneurysmal thoracic aorta with ascending and arch repair and great vessel reimplantation (which is not visualized). The left subclavian has a native position with graft covering the ostium and asymmetrically faint enhancement. There could be left vertebral retrograde flow. 2. No significant atherosclerosis in the neck.   Electronically Signed: By: Marnee Spring M.D. On: 11/16/2019 13:16   Echo 11/16/19: IMPRESSIONS     1. Left ventricular ejection fraction, by estimation, is 60 to 65%. The  left ventricle has normal function. The left ventricle has no regional  wall motion abnormalities. There is moderate left ventricular hypertrophy.  Left ventricular diastolic  parameters are consistent with Grade II diastolic dysfunction  (pseudonormalization). Elevated left atrial pressure.   2. Right ventricular systolic function is normal. The right ventricular  size is normal. There is normal pulmonary artery systolic pressure. The  estimated right ventricular systolic pressure is 24.5 mmHg.   3. Left atrial size was moderately dilated.   4. Right atrial  size was mildly dilated.   5. The mitral valve was not well visualized. Trivial mitral valve  regurgitation.   6. Tricuspid valve regurgitation is mild to moderate.   7. The aortic valve has been repaired/replaced. Aortic valve  regurgitation is not visualized. Mild aortic valve stenosis. Vmax 2.6 m/s,  MG 12 mmHg, AVA 1.4 cm^2, DI 0.34   8. The inferior vena cava is normal in size with greater than 50%  respiratory variability, suggesting right atrial pressure of 3 mmHg.   9. 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 S/p Bentall procedure, prosthetic aortic valve not well visualized.  Given presentation with CVA, would recommend TEE for further evaluation   Echo 03/15/22: IMPRESSIONS     1. Left ventricular ejection fraction, by estimation, is 55 to 60%. The  left ventricle has normal function. The left ventricle demonstrates  regional wall motion abnormalities (see scoring diagram/findings for  description). There is mild concentric left  ventricular hypertrophy. Left ventricular diastolic parameters are  consistent with Grade II diastolic dysfunction (pseudonormalization).  Elevated left atrial pressure.   2. Right ventricular systolic function is mildly reduced. The right  ventricular size is normal. There is normal pulmonary artery systolic  pressure. The estimated right ventricular systolic pressure is 24.7 mmHg.   3. Left atrial size was mildly dilated.   4. The mitral valve is myxomatous. Mild mitral valve regurgitation.   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   6. The aortic valve has been repaired/replaced. Aortic valve  regurgitation is not visualized. No aortic stenosis is present. There is a  valve present in the aortic position. Echo findings are consistent with  normal structure and function of the aortic  valve prosthesis. Aortic valve mean gradient measures 11.0 mmHg. Aortic  valve  Vmax measures 2.31 m/s. Aortic valve acceleration time measures 49  msec.   7. Aortic root/ascending aorta has been repaired/replaced and dilatation  noted. 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   Comparison(s): No significant change from prior study. Prior images  reviewed side by side.  ASSESSMENT AND PLAN:  1. HTN -BP is labile. Excellent control today.  Continue Avapro. She is going to call back and let us know the Cardizem dose she is taking.  2. 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  Continue ASA.   3. HLD- patient is not on statin. Refuses to take.   4. Mild LV dysfunction EF 45%-  resolved on last ECHO. EF now 60%.  5. Ascending aortic aneurysm s/p extensive Bental procedure with tissue AVR 2016 -- 2D ECHO in November 2018 with normal LVF with stable AVR and ascending aortic replacement -- CT done September 2021 as noted above - Echo in Sept 2021 stable.   6.  CVA 11/18. Recurrent stroke in Dec 2023. Recommend she take ASA 81 mg daily.,  7. Anxiety.   8. Right central retinal artery occlusion. September 2021.   9. PAT during hospitalization for hip fracture. Asymptomatic.    Current medicines are reviewed at length with the patient today.  The patient does not have concerns regarding medicines.  The following changes have been made:  See above  Labs/ tests ordered today include: none    Disposition:   FU in 6 months with APP   Signed, Vickey Ewbank [place], MD  11/25/2022 2:10 PM    [facility]

## 2022-11-25 ENCOUNTER — Telehealth: Payer: Self-pay | Admitting: Cardiology

## 2022-11-25 ENCOUNTER — Encounter: Payer: Self-pay | Admitting: Cardiology

## 2022-11-25 ENCOUNTER — Ambulatory Visit: Payer: 59 | Attending: Cardiology | Admitting: Cardiology

## 2022-11-25 VITALS — BP 124/84 | HR 73 | Ht 60.0 in | Wt 92.0 lb

## 2022-11-25 DIAGNOSIS — I4719 Other supraventricular tachycardia: Secondary | ICD-10-CM

## 2022-11-25 DIAGNOSIS — Z952 Presence of prosthetic heart valve: Secondary | ICD-10-CM

## 2022-11-25 DIAGNOSIS — E785 Hyperlipidemia, unspecified: Secondary | ICD-10-CM

## 2022-11-25 DIAGNOSIS — I7121 Aneurysm of the ascending aorta, without rupture: Secondary | ICD-10-CM | POA: Diagnosis not present

## 2022-11-25 DIAGNOSIS — I10 Essential (primary) hypertension: Secondary | ICD-10-CM | POA: Diagnosis not present

## 2022-11-25 MED ORDER — ASPIRIN 81 MG PO TBEC: 81.0000 mg | DELAYED_RELEASE_TABLET | Freq: Every day | ORAL | Status: AC

## 2022-11-25 NOTE — Telephone Encounter (Signed)
Will forward to Dr [place] for review .Zack Seal

## 2022-11-25 NOTE — Telephone Encounter (Deleted)
Expand All Collapse All       Cardiology Office Note     Date:  11/25/2022    ID:  Yvonne Ballard, DOB 1941/03/09, MRN [id number]   PCP:  Crist Fat, MD     Cardiologist:  Dr. Swaziland   History of Present Illness: Yvonne Ballard is a 82 y.o. 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 A 2D echo was done which showed a large thoracic aneurysm and the patient underwent heart catheterization showing mild nonobstructive CAD with mild LV dysfunction EF 45% with thoracic aneurysm and mildly elevated PA pressures.  She underwent ascending and arch TAA repair with Bio-Bental procedure with tissue AVR and aorto-brachiocephalic bypass and aorto-left common carotid bypass on 03/31/2014.    She was admitted in November 2018 with left facial droop and dysarthria. CT acutely was negative. MRI significant for acute infarction involving the right posterior lentiform nucleus, mid corona radiata, and caudate body. She had evidence of moderate chronic microvascular changes. She was treated with lipitor and ASA.    She was seen in February 2020 in the ED with some dysarthria. MRI and CT of the head showed no acute changes with chronic microvascular disease.    In September she was admitted with a right central retinal artery occlusion. MRI showed small vessel disease without CVA. CT chest done with findings noted below. Echo was stable. There was consideration of TEE as outpatient but patient never followed up with cardiology.    She was hospitalized in January 2024 in the setting of acute CVA.  Echocardiogram during her hospitalization showed EF 50 to 60%, left ventricular regional wall motion abnormalities, mild left ventricular hypertrophy, normally functioning bioprosthetic valve.  She was last seen in the office on 05/04/2022 and was stable from a cardiac standpoint.  Her BP was  elevated.  Irbesartan was increased to 75 mg twice daily.  She was hospitalized in 05/2022 in the setting of mechanical fall.  Hip x-ray showed acute transverse fracture of the proximal left femoral neck with mild to moderate superolateral displacement of the distal fracture component and valgus angulation.  She saw orthopedic surgery and underwent left hip hemiarthroplasty on 05/19/2022.  She did have postop anemia.  Cardiology was consulted prior to surgery in the setting of irregular heart rhythm on telemetry.  Telemetry review revealed sinus with PAT.  She was asymptomatic and she was deemed acceptable risk for surgery. She did have follow up in our office in April and denies cardiac complaints. Noted she had stopped taking Plavix and refused statin therapy.    On follow up today she is doing well. 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 Now walks with a walker. No chest pain or dyspnea. States she doesn't take ASA regularly. States she thinks she is taking cardizem bid but this is not listed on her meds.        Past Medical History:  Diagnosis Date   Abnormal ECG     Anxiety      pt. 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   Stroke (HCC) 03/14/2022   Weight loss                 Past Surgical History:  Procedure Laterality Date   BENTALL PROCEDURE N/A 03/31/2014    Procedure: BIO-BENTALL PROCEDURE using a 23mm Magna Ease Aortic Tissue Valve, a 26 mm Terumo Gelweave Valsalva Graft, a 14x10x58mm Terumo Gelweave  Graft, and a 28mm straight Hemashield Platinum Graft.;  Surgeon: Kerin Perna, MD;  Location: [facility] OR;  Service: Open Heart Surgery;  Laterality: N/A;   HIP ARTHROPLASTY Left 05/19/2022    Procedure: LEFT PARTIAL HIP ARTHROPLASTY;  Surgeon: Teryl Lucy, MD;  Location: [facility] OR;  Service: Orthopedics;  Laterality: Left;   LEFT AND RIGHT HEART CATHETERIZATION WITH CORONARY ANGIOGRAM N/A 03/26/2014    Procedure: LEFT AND RIGHT HEART CATHETERIZATION WITH CORONARY ANGIOGRAM;  Surgeon: Peter M Swaziland, MD;  Location: [facility] CATH LAB;  Service: Cardiovascular;  Laterality: N/A;   TEE WITHOUT CARDIOVERSION N/A 03/31/2014    Procedure: TRANSESOPHAGEAL ECHOCARDIOGRAM (TEE);  Surgeon: Kerin Perna, MD;  Location: [facility] OR;  Service: Open Heart Surgery;  Laterality: N/A;   TONSILLECTOMY       TUBAL LIGATION                      Current Outpatient Medications  Medication Sig Dispense Refill   aspirin EC 81 MG tablet Take 1 tablet (81 mg total) by mouth daily. Swallow whole.       diltiazem (CARDIZEM) 60 MG tablet Take 30 mg by mouth daily as needed (SBP > 150).       irbesartan (AVAPRO) 150 MG tablet TAKE 1/2 TABLET TWICE A DAY. (Patient taking differently: Take 75 mg by mouth 2 (two) times daily.) 90 tablet 3   Multiple Vitamin (MULTIVITAMIN WITH MINERALS) TABS tablet Take 1 tablet by mouth daily.          No current facility-administered medications for this visit.        Allergies:   Patient has no known allergies.      Social History:  The patient  reports that she has never smoked. She has never used smokeless tobacco. She reports current alcohol use. She reports that she does not use drugs.    Family History:  The patient's family history includes Cancer in her brother, father, and mother; Stroke in her father.      ROS:  Please see the history of present illness.   Otherwise, review of systems are positive for none.   All other systems are reviewed and negative.      PHYSICAL EXAM: VS:  BP  124/84   Pulse 73   Ht 5' (1.524 m)   Wt 92 lb (41.7 kg)   SpO2 97%   BMI 17.97 kg/m  , BMI Body mass index is 17.97 kg/m.  GENERAL:  Well appearing, elderly WF in NAD HEENT:  PERRL, EOMI, sclera are clear. Oropharynx is clear. NECK:  No jugular venous distention, carotid upstroke brisk and symmetric, no bruits, no thyromegaly or adenopathy LUNGS:  Clear to auscultation bilaterally CHEST:  Unremarkable HEART:  IRRR,  PMI not displaced or sustained,S1 and S2  within normal limits, no S3, no S4: no clicks, no rubs, gr 1/6 systolic murmur RUSB>LSB ABD:  Soft, nontender. BS +, no masses or bruits. No hepatomegaly, no splenomegaly EXT:  2 + pulses throughout, no edema, no cyanosis no clubbing SKIN:  Warm and dry.  No rashes NEURO:  Alert and oriented x 3. Cranial nerves II through XII intact. PSYCH:  Cognitively intact         Recent Labs: 03/14/2022: ALT 17 03/17/2022: Magnesium 1.9 05/23/2022: BUN 18; Creatinine, Ser 0.57; Hemoglobin 8.7; Platelets 136; Potassium 3.8; Sodium 137      Lipid Panel Labs (Brief)          Component Value Date/Time    CHOL 216 (H) 03/14/2022 1023    CHOL 215 (H) 11/04/2019 1545    TRIG 61 03/14/2022 1023    HDL 77 03/14/2022 1023    HDL 72 11/04/2019 1545    CHOLHDL 2.8 03/14/2022 1023    VLDL 12 03/14/2022 1023    LDLCALC 127 (H) 03/14/2022 1023    LDLCALC 123 (H) 11/04/2019 1545    LDLDIRECT 124.5 (H) 11/16/2019 0545      Dated 12/13/17: cholesterol 204, triglycerides 84, HDL 62, LDL 125.  Dated 03/27/20: cholesterol 190, triglycerides 74, HDL 68, LDL 108. CMET normal. Dated 05/25/21: cholesterol 215, triglycerides 93, HDL 75, LDL 123. CMET normal Dated 07/20/22: cholesterol 192, triglycerides 86, HDL 71, LDL 106. CMET normal.      Wt Readings from Last 3 Encounters:  11/25/22 92 lb (41.7 kg)  07/01/22 93 lb (42.2 kg)  05/19/22 97 lb (44 kg)        Other studies Reviewed:   -- 2D ECHO (05/08/14) EF 55-60%, no RWMA, G2DD, s/p biological  Bentall procedure w/ total large replacement of a large arch aneurysm. Mean AV grad WNL for bioprosthesis. No AR. Mod MR thickening. L pleural effusion.      Echo 02/08/17: Study Conclusions   - Left ventricle: The cavity size was normal. Wall thickness was   increased in a pattern of mild LVH. Systolic function was normal.   The estimated ejection fraction was in the range of 60% to 65%.   Left ventricular diastolic function parameters were normal. - Aortic valve: Aoritc sinuses appear somewhat dilated above AVR   Post Bental AV leaflets not well seen mild gradient Root not well   visualized no AR Valve area (VTI): 1.49 cm^2. Valve area (Vmax):   1.37 cm^2. Valve area (Vmean): 1.3 cm^2. - Mitral valve: Calcified annulus. Mildly thickened leaflets . - Atrial septum: No defect or patent foramen ovale was identified   ADDENDUM: Dr. Roda Shutters called to discuss the patient's thoracic aortic aneurysm. The luminal flap at the isthmus is contiguous with anastomosis between the prosthetic arch and the native aorta and the flap is attributed to graft material (elephant trunk). In this system the aorta has not been imaged since before her repair and the patient is presumably under the care of a cardiovascular surgeon. The aneurysmal descending aorta has increased size since a 2016 comparison, 4.2 cm today compared to 3.5 cm ~6 years ago.     Electronically Signed   By: Marnee Spring M.D.   On: 11/17/2019 09:09    Addended by Audry Riles, MD on 11/17/2019  9:11 AM    Study Result   Narrative & Impression  CLINICAL DATA:  Stroke/TIA.   EXAM: CT ANGIOGRAPHY NECK   TECHNIQUE: Multidetector CT imaging of the neck was performed using the  standard protocol during bolus administration of intravenous contrast. Multiplanar CT image reconstructions and MIPs were obtained to evaluate the vascular anatomy. Carotid stenosis measurements (when applicable) are obtained utilizing NASCET criteria,  using the distal internal carotid diameter as the denominator.   CONTRAST:  50mL OMNIPAQUE IOHEXOL 300 MG/ML  SOLN   COMPARISON:  None similar   FINDINGS: Arteries: Aneurysmal descending aorta, measuring up to 4.2 cm on axial slices. There has been an ascending and arch repair with great vessel reimplantation, ostia not covered. Where covered, the great vessels are widely patent. There is ICA tortuosity and right more than left kinking without atheromatous stenosis. The left vertebral artery arises from the left subclavian artery which appears native and less intensely enhancing than the right. No superimposed stenosis is noted. The left vertebral artery is progressively dense towards the basilar and possibly flowing retrograde.   Skeleton: No acute or aggressive finding. Diffuse disc narrowing and endplate degeneration with degenerative sclerosis anteriorly at T1-2 there is disc collapse.   Other neck: No evidence of inflammation or mass.   Upper chest: Aneurysmal aorta is noted above.   IMPRESSION: 1. Aneurysmal thoracic aorta with ascending and arch repair and great vessel reimplantation (which is not visualized). The left subclavian has a native position with graft covering the ostium and asymmetrically faint enhancement. There could be left vertebral retrograde flow. 2. No significant atherosclerosis in the neck.   Electronically Signed: By: Marnee Spring M.D. On: 11/16/2019 13:16    Echo 11/16/19: IMPRESSIONS     1. Left ventricular ejection fraction, by estimation, is 60 to 65%. The  left ventricle has normal function. The left ventricle has no regional  wall motion abnormalities. There is moderate left ventricular hypertrophy.  Left ventricular diastolic  parameters are consistent with Grade II diastolic dysfunction  (pseudonormalization). Elevated left atrial pressure.   2. Right ventricular systolic function is normal. The right ventricular  size is normal.  There is normal pulmonary artery systolic pressure. The  estimated right ventricular systolic pressure is 24.5 mmHg.   3. Left atrial size was moderately dilated.   4. Right atrial size was mildly dilated.   5. The mitral valve was not well visualized. Trivial mitral valve  regurgitation.   6. Tricuspid valve regurgitation is mild to moderate.   7. The aortic valve has been repaired/replaced. Aortic valve  regurgitation is not visualized. Mild aortic valve stenosis. Vmax 2.6 m/s,  MG 12 mmHg, AVA 1.4 cm^2, DI 0.34   8. The inferior vena cava is normal in size with greater than 50%  respiratory variability, suggesting right atrial pressure of 3 mmHg.   9. 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 S/p Bentall procedure, prosthetic aortic valve not well visualized.  Given presentation with CVA, would recommend TEE for further evaluation    Echo 03/15/22: IMPRESSIONS     1. Left ventricular ejection fraction, by estimation, is 55 to 60%. The  left ventricle has normal function. The left ventricle demonstrates  regional wall motion abnormalities (see scoring diagram/findings for  description). There is mild concentric left  ventricular hypertrophy. Left ventricular diastolic parameters are  consistent with Grade II diastolic dysfunction (pseudonormalization).  Elevated left atrial pressure.   2. Right ventricular systolic function is mildly reduced. The right  ventricular size is normal. There is normal pulmonary artery systolic  pressure. The estimated right ventricular systolic pressure is 24.7 mmHg.   3. Left atrial size was mildly dilated.   4. The  mitral valve is myxomatous. Mild mitral valve regurgitation.   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   6. The aortic valve has been repaired/replaced. Aortic valve  regurgitation is not visualized. No aortic stenosis is present. There is a  valve present  in the aortic position. Echo findings are consistent with  normal structure and function of the aortic  valve prosthesis. Aortic valve mean gradient measures 11.0 mmHg. Aortic  valve Vmax measures 2.31 m/s. Aortic valve acceleration time measures 49  msec.   7. Aortic root/ascending aorta has been repaired/replaced and dilatation  noted. 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   Comparison(s): No significant change from prior study. Prior images  reviewed side by side.      ASSESSMENT AND PLAN:   1. HTN -BP is labile. Excellent control today.  Continue Avapro. She is going to call back and let us know the Cardizem dose she is taking.

## 2022-11-25 NOTE — Patient Instructions (Signed)
Medication Instructions:  Start taking Baby Aspirin 81 MG once a day *If you need a refill on your cardiac medications before your next appointment, please call your pharmacy*   Lab Work: No Labs If you have labs (blood work) drawn today and your tests are completely normal, you will receive your results only by: MyChart Message (if you have MyChart) OR A paper copy in the mail If you have any lab test that is abnormal or we need to change your treatment, we will call you to review the results.   Testing/Procedures: No Testing   Follow-Up: At [facility], you and your health needs are our priority.  As part of our continuing mission to provide you with exceptional heart care, we have created designated Provider Care Teams.  These Care Teams include your primary Cardiologist (physician) and Advanced Practice Providers (APPs -  Physician Assistants and Nurse Practitioners) who all work together to provide you with the care you need, when you need it.  We recommend signing up for the patient portal called "MyChart".  Sign up information is provided on this After Visit Summary.  MyChart is used to connect with patients for Virtual Visits (Telemedicine).  Patients are able to view lab/test results, encounter notes, upcoming appointments, etc.  Non-urgent messages can be sent to your provider as well.   To learn more about what you can do with MyChart, go to [email or web address].    Your next appointment:   6 month(s)  Provider:   Micah Flesher, PA-C or Bernadene Person NP  Other Instructions When you get home please check which medication is being taken twice a day, then call our office back.

## 2022-11-25 NOTE — Telephone Encounter (Signed)
Pt c/o medication issue:  1. Name of Medication:  Diltiazem 120 MG  2. How are you currently taking this medication (dosage and times per day)?   3. Are you having a reaction (difficulty breathing--STAT)?   4. What is your medication issue?    Patient would like to inform Dr. Swaziland that she takes Diltiazem 120 MG (1 capsule in the morning and 1 capsule at night). She states this was discussed during her appointment today.

## 2022-12-12 ENCOUNTER — Telehealth: Payer: Self-pay | Admitting: *Deleted

## 2022-12-12 NOTE — Telephone Encounter (Signed)
   Pre-operative Risk Assessment    Patient Name: Yvonne Ballard  DOB: Jan 19, 1941 MRN: [id number]    DATE OF LAST VISIT: 11/25/22 DR. Swaziland DATE OF NEXT VISIT: NONE  Request for Surgical Clearance    Procedure:   CATARACT EXTRACTION BY PE, IOL-LEFT EYE  Date of Surgery:  Clearance 12/29/22                                 Surgeon:  DR. Mack Hook Surgeon's Group or Practice Name:  [facility] Phone number:  [phone] Fax number:  [phone]   Type of Clearance Requested:   - Medical ; NO MEDICATIONS LISTED AS NEEDING TO BE HELD   Type of Anesthesia:   IV SEDATION   Additional requests/questions:    Yvonne Ballard   12/12/2022, 5:58 PM

## 2022-12-12 NOTE — Telephone Encounter (Signed)
   Patient Name: Yvonne Ballard  DOB: 1940-11-14 MRN: [id number]  Primary Cardiologist: Peter Swaziland, MD  Chart reviewed as part of pre-operative protocol coverage. Cataract extractions are recognized in guidelines as low risk surgeries that do not typically require specific preoperative testing or holding of blood thinner therapy. Therefore, given past medical history and time since last visit, based on ACC/AHA guidelines, Yvonne Ballard would be at acceptable risk for the planned procedure without further cardiovascular testing.   I will route this recommendation to the requesting party via Epic fax function and remove from pre-op pool.  Please call with questions.  Napoleon Form, Leodis Rains, NP 12/12/2022, 6:20 PM

## 2023-01-13 DEATH — deceased
